# Patient Record
Sex: Female | Born: 1940
Health system: Southern US, Community
[De-identification: ages and names within clinical notes are randomized; demographics above are authoritative.]

## PROBLEM LIST (undated history)

## (undated) DIAGNOSIS — T4145XA Adverse effect of unspecified anesthetic, initial encounter: Secondary | ICD-10-CM

## (undated) DIAGNOSIS — M5136 Other intervertebral disc degeneration, lumbar region: Secondary | ICD-10-CM

## (undated) DIAGNOSIS — K5900 Constipation, unspecified: Secondary | ICD-10-CM

## (undated) DIAGNOSIS — G250 Essential tremor: Secondary | ICD-10-CM

## (undated) DIAGNOSIS — M545 Low back pain, unspecified: Secondary | ICD-10-CM

## (undated) DIAGNOSIS — T84029A Dislocation of unspecified internal joint prosthesis, initial encounter: Secondary | ICD-10-CM

## (undated) DIAGNOSIS — I779 Disorder of arteries and arterioles, unspecified: Secondary | ICD-10-CM

## (undated) DIAGNOSIS — M4326 Fusion of spine, lumbar region: Secondary | ICD-10-CM

## (undated) DIAGNOSIS — M79605 Pain in left leg: Secondary | ICD-10-CM

## (undated) DIAGNOSIS — C439 Malignant melanoma of skin, unspecified: Secondary | ICD-10-CM

## (undated) DIAGNOSIS — I739 Peripheral vascular disease, unspecified: Secondary | ICD-10-CM

## (undated) DIAGNOSIS — Z96649 Presence of unspecified artificial hip joint: Secondary | ICD-10-CM

## (undated) DIAGNOSIS — I10 Essential (primary) hypertension: Secondary | ICD-10-CM

## (undated) DIAGNOSIS — N189 Chronic kidney disease, unspecified: Secondary | ICD-10-CM

## (undated) DIAGNOSIS — T8859XA Other complications of anesthesia, initial encounter: Secondary | ICD-10-CM

## (undated) DIAGNOSIS — M1990 Unspecified osteoarthritis, unspecified site: Secondary | ICD-10-CM

## (undated) DIAGNOSIS — G479 Sleep disorder, unspecified: Secondary | ICD-10-CM

## (undated) DIAGNOSIS — M8580 Other specified disorders of bone density and structure, unspecified site: Secondary | ICD-10-CM

## (undated) DIAGNOSIS — I639 Cerebral infarction, unspecified: Secondary | ICD-10-CM

## (undated) DIAGNOSIS — M48061 Spinal stenosis, lumbar region without neurogenic claudication: Secondary | ICD-10-CM

## (undated) DIAGNOSIS — C679 Malignant neoplasm of bladder, unspecified: Secondary | ICD-10-CM

## (undated) DIAGNOSIS — E785 Hyperlipidemia, unspecified: Secondary | ICD-10-CM

## (undated) DIAGNOSIS — I209 Angina pectoris, unspecified: Secondary | ICD-10-CM

## (undated) DIAGNOSIS — M51369 Other intervertebral disc degeneration, lumbar region without mention of lumbar back pain or lower extremity pain: Secondary | ICD-10-CM

## (undated) DIAGNOSIS — F419 Anxiety disorder, unspecified: Secondary | ICD-10-CM

## (undated) DIAGNOSIS — R011 Cardiac murmur, unspecified: Secondary | ICD-10-CM

## (undated) DIAGNOSIS — G459 Transient cerebral ischemic attack, unspecified: Secondary | ICD-10-CM

## (undated) HISTORY — DX: Unspecified osteoarthritis, unspecified site: M19.90

## (undated) HISTORY — PX: TONSILLECTOMY AND ADENOIDECTOMY: SUR1326

## (undated) HISTORY — PX: CATARACT EXTRACTION W/ INTRAOCULAR LENS  IMPLANT, BILATERAL: SHX1307

## (undated) HISTORY — DX: Other specified disorders of bone density and structure, unspecified site: M85.80

## (undated) HISTORY — PX: BLADDER SURGERY: SHX569

## (undated) HISTORY — PX: BREAST BIOPSY: SHX20

## (undated) HISTORY — PX: TENDON REPAIR: SHX5111

## (undated) HISTORY — DX: Presence of unspecified artificial hip joint: T84.029A

## (undated) HISTORY — DX: Other intervertebral disc degeneration, lumbar region without mention of lumbar back pain or lower extremity pain: M51.369

## (undated) HISTORY — DX: Other intervertebral disc degeneration, lumbar region: M51.36

## (undated) HISTORY — PX: ANTERIOR LUMBAR FUSION: SHX1170

## (undated) HISTORY — PX: PILONIDAL CYST DRAINAGE: SHX743

## (undated) HISTORY — DX: Sleep disorder, unspecified: G47.9

## (undated) HISTORY — PX: POSTERIOR LUMBAR FUSION: SHX6036

## (undated) HISTORY — DX: Spinal stenosis, lumbar region without neurogenic claudication: M48.061

## (undated) HISTORY — DX: Hyperlipidemia, unspecified: E78.5

## (undated) HISTORY — DX: Fusion of spine, lumbar region: M43.26

## (undated) HISTORY — DX: Essential (primary) hypertension: I10

## (undated) HISTORY — PX: PILONIDAL CYST / SINUS EXCISION: SUR543

## (undated) HISTORY — DX: Peripheral vascular disease, unspecified: I73.9

## (undated) HISTORY — PX: EYE SURGERY: SHX253

## (undated) HISTORY — PX: MELANOMA EXCISION: SHX5266

## (undated) HISTORY — DX: Disorder of arteries and arterioles, unspecified: I77.9

## (undated) HISTORY — DX: Malignant neoplasm of bladder, unspecified: C67.9

## (undated) HISTORY — PX: THUMB FUSION: SUR636

## (undated) HISTORY — PX: LUMBAR SPINE HARDWARE REMOVAL: SHX1987

## (undated) HISTORY — PX: BACK SURGERY: SHX140

## (undated) HISTORY — DX: Presence of unspecified artificial hip joint: Z96.649

## (undated) HISTORY — PX: BUNIONECTOMY: SHX129

## (undated) HISTORY — DX: Essential tremor: G25.0

---

## 1999-09-17 ENCOUNTER — Other Ambulatory Visit: Admission: RE | Admit: 1999-09-17 | Discharge: 1999-09-17 | Payer: Self-pay | Admitting: Gynecology

## 2000-06-24 ENCOUNTER — Ambulatory Visit (HOSPITAL_COMMUNITY): Admission: RE | Admit: 2000-06-24 | Discharge: 2000-06-24 | Payer: Self-pay | Admitting: Internal Medicine

## 2000-08-27 ENCOUNTER — Ambulatory Visit (HOSPITAL_COMMUNITY): Admission: RE | Admit: 2000-08-27 | Discharge: 2000-08-27 | Payer: Self-pay | Admitting: Gastroenterology

## 2000-10-14 ENCOUNTER — Other Ambulatory Visit: Admission: RE | Admit: 2000-10-14 | Discharge: 2000-10-14 | Payer: Self-pay | Admitting: Gynecology

## 2001-09-15 ENCOUNTER — Other Ambulatory Visit: Admission: RE | Admit: 2001-09-15 | Discharge: 2001-09-15 | Payer: Self-pay | Admitting: Gynecology

## 2002-07-02 ENCOUNTER — Emergency Department (HOSPITAL_COMMUNITY): Admission: EM | Admit: 2002-07-02 | Discharge: 2002-07-02 | Payer: Self-pay | Admitting: Emergency Medicine

## 2002-10-04 ENCOUNTER — Other Ambulatory Visit: Admission: RE | Admit: 2002-10-04 | Discharge: 2002-10-04 | Payer: Self-pay | Admitting: Gynecology

## 2003-10-10 ENCOUNTER — Other Ambulatory Visit: Admission: RE | Admit: 2003-10-10 | Discharge: 2003-10-10 | Payer: Self-pay | Admitting: Gynecology

## 2004-07-17 ENCOUNTER — Ambulatory Visit (HOSPITAL_BASED_OUTPATIENT_CLINIC_OR_DEPARTMENT_OTHER): Admission: RE | Admit: 2004-07-17 | Discharge: 2004-07-17 | Payer: Self-pay | Admitting: Orthopedic Surgery

## 2004-07-17 ENCOUNTER — Ambulatory Visit (HOSPITAL_COMMUNITY): Admission: RE | Admit: 2004-07-17 | Discharge: 2004-07-17 | Payer: Self-pay | Admitting: Orthopedic Surgery

## 2006-02-19 ENCOUNTER — Ambulatory Visit (HOSPITAL_COMMUNITY): Admission: RE | Admit: 2006-02-19 | Discharge: 2006-02-19 | Payer: Self-pay | Admitting: Orthopedic Surgery

## 2007-01-21 ENCOUNTER — Ambulatory Visit: Payer: Self-pay | Admitting: Vascular Surgery

## 2007-08-25 ENCOUNTER — Ambulatory Visit: Payer: Self-pay | Admitting: Vascular Surgery

## 2008-04-12 ENCOUNTER — Ambulatory Visit: Payer: Self-pay | Admitting: Vascular Surgery

## 2008-10-25 ENCOUNTER — Ambulatory Visit: Payer: Self-pay | Admitting: Vascular Surgery

## 2009-05-02 ENCOUNTER — Ambulatory Visit: Payer: Self-pay | Admitting: Vascular Surgery

## 2009-10-26 ENCOUNTER — Ambulatory Visit: Payer: Self-pay | Admitting: Vascular Surgery

## 2010-06-20 ENCOUNTER — Ambulatory Visit: Payer: Self-pay | Admitting: Vascular Surgery

## 2010-11-20 NOTE — Procedures (Signed)
CAROTID DUPLEX EXAM   INDICATION:  Follow up carotid disease.   HISTORY:  Diabetes:  No.  Cardiac:  No.  Hypertension:  Yes.  Smoking:  No.  Previous Surgery:  No.  CV History:  No.  Amaurosis Fugax No, Paresthesias No, Hemiparesis No.                                       RIGHT             LEFT  Brachial systolic pressure:         130               140  Brachial Doppler waveforms:         WNL               WNL  Vertebral direction of flow:        Antegrade         Antegrade  DUPLEX VELOCITIES (cm/sec)  CCA peak systolic                   111               109  ECA peak systolic                   81                95  ICA peak systolic                   161               259  ICA end diastolic                   52                93  PLAQUE MORPHOLOGY:                  Heterogenous      Calcific  PLAQUE AMOUNT:                      Mild-to-moderate  Moderate  PLAQUE LOCATION:                    ICA               ICA   IMPRESSION:  1. 60% to 79% left internal carotid artery stenosis.  2. 40% to 59% right internal carotid artery stenosis.  3. Antegrade bilateral vertebral arteries.  4. Stable findings from previous examination on 10/26/2009.   ___________________________________________  [name]. Edilia Bo, M.D.   LT/MEDQ  D:  06/20/2010  T:  06/20/2010  Job:  [id number]

## 2010-11-20 NOTE — Procedures (Signed)
CAROTID DUPLEX EXAM   INDICATION:  Follow up of carotid disease.   HISTORY:  Diabetes:  No.  Cardiac:  No.  Hypertension:  Yes.  Smoking:  No.  Previous Surgery:  No.  CV History:  Asymptomatic.  Amaurosis Fugax No, Paresthesias No, Hemiparesis No.                                       RIGHT             LEFT  Brachial systolic pressure:         121               123  Brachial Doppler waveforms:         Triphasic         Triphasic  Vertebral direction of flow:        Antegrade         Antegrade  DUPLEX VELOCITIES (cm/sec)  CCA peak systolic                   76                66  ECA peak systolic                   66                57  ICA peak systolic                   123               120  ICA end diastolic                   48                38  PLAQUE MORPHOLOGY:                  Mixed             Mixed  PLAQUE AMOUNT:                      Moderate          Moderate  PLAQUE LOCATION:                    ICA, ECA          ICA, ECA   IMPRESSION:  1. 40-59% stenosis noted in bilateral internal carotid arteries.  2. Area where previous 60-79% stenosis was identified shows minimal      amount of plaque at this time on the left.   ___________________________________________  [name]. Edilia Bo, M.D.   CJ/MEDQ  D:  05/02/2009  T:  05/02/2009  Job:  [id number]

## 2010-11-20 NOTE — Procedures (Signed)
CAROTID DUPLEX EXAM   INDICATION:  Follow up carotid artery disease.   HISTORY:  Diabetes:  No  Cardiac:  No  Hypertension:  Yes, on medication.  Smoking:  No  Previous Surgery:  No  CV History:  No  Amaurosis Fugax No, Paresthesias  No, Hemiparesis  No                                       RIGHT             LEFT  Brachial systolic pressure:         152               157  Brachial Doppler waveforms:         biphasic          biphasic  Vertebral direction of flow:        antegrade         antegrade  DUPLEX VELOCITIES (cm/sec)  CCA peak systolic                   97                112  ECA peak systolic                   81                84  ICA peak systolic                   146               205  ICA end diastolic                   47                58  PLAQUE MORPHOLOGY:                  calcified         calcified  PLAQUE AMOUNT:                      moderate          moderate  PLAQUE LOCATION:                    ICA               ICA   IMPRESSION:  The right internal carotid artery shows evidence of 40% to  59% stenosis.  The left internal carotid artery shows evidence of 60% to  79% stenosis (lower end of range).  No significant changes from previous  study.   ___________________________________________  [name]. Edilia Bo, M.D.   AS/MEDQ  D:  01/21/2007  T:  01/22/2007  Job:  [id number]

## 2010-11-20 NOTE — Procedures (Signed)
CAROTID DUPLEX EXAM   INDICATION:  Followup, carotid artery disease.   HISTORY:  Diabetes:  No.  Cardiac:  No.  Hypertension:  Yes, on medication.  Smoking:  No.  Previous Surgery:  No.  CV History:  No.  Amaurosis Fugax No, Paresthesias No, Hemiparesis No                                        RIGHT             LEFT  Brachial systolic pressure:         140               140  Brachial Doppler waveforms:         Biphasic          Biphasic  Vertebral direction of flow:        Antegrade         Antegrade  DUPLEX VELOCITIES (cm/sec)  CCA peak systolic                   93                84  ECA peak systolic                   64                51  ICA peak systolic                   112               124  ICA end diastolic                   36                31  PLAQUE MORPHOLOGY:                  Mixed             Calcified with  shadowing  PLAQUE AMOUNT:                      Mild              Moderate  PLAQUE LOCATION:                    ICA               ICA   IMPRESSION:  20-39% bilateral internal carotid artery stenoses (upper  end of range); however, calcified plaque with shadowing on the left  could obscure a more severe stenosis.     ___________________________________________  [name]. Edilia Bo, M.D.   DP/MEDQ  D:  08/25/2007  T:  08/26/2007  Job:  [id number]

## 2010-11-20 NOTE — Procedures (Signed)
CAROTID DUPLEX EXAM   INDICATION:  Carotid disease.   HISTORY:  Diabetes:  No.  Cardiac:  No.  Hypertension:  Yes.  Smoking:  No.  Previous Surgery:  No.  CV History:  Asymptomatic.  Amaurosis Fugax No, Paresthesias No, Hemiparesis No.                                       RIGHT             LEFT  Brachial systolic pressure:         142               140  Brachial Doppler waveforms:         Normal            Normal  Vertebral direction of flow:        Antegrade         Antegrade  DUPLEX VELOCITIES (cm/sec)  CCA peak systolic                   111               116  ECA peak systolic                   67                84  ICA peak systolic                   160               246  ICA end diastolic                   49                73  PLAQUE MORPHOLOGY:                  Mixed             Mixed  PLAQUE AMOUNT:                      Moderate          Moderate  PLAQUE LOCATION:                    ICA/ECA           ICA/ECA   IMPRESSION:  1. Doppler velocity suggests a 40% to 59% stenosis of the right      internal carotid artery and a 60% to 79% stenosis of the left      internal carotid artery.  2. No significant change noted when compared to the previous exam on      04/12/2008.   ___________________________________________  [name]. Edilia Bo, M.D.   CH/MEDQ  D:  10/25/2008  T:  10/25/2008  Job:  [id number]

## 2010-11-20 NOTE — Procedures (Signed)
CAROTID DUPLEX EXAM   INDICATION:  Follow up known carotid artery disease.   HISTORY:  Diabetes:  No.  Cardiac:  No.  Hypertension:  Yes.  Smoking:  No.  Previous Surgery:  None.  CV History:  Amaurosis Fugax No, Paresthesias No, Hemiparesis No.                                       RIGHT             LEFT  Brachial systolic pressure:  Brachial Doppler waveforms:  Vertebral direction of flow:        Antegrade         Antegrade  DUPLEX VELOCITIES (cm/sec)  CCA peak systolic                   133               121  ECA peak systolic                   99                116  ICA peak systolic                   177               233  ICA end diastolic                   63                67  PLAQUE MORPHOLOGY:                  Heterogenous      Heterogenous  PLAQUE AMOUNT:                      Moderate          Moderate  PLAQUE LOCATION:                    ICA, ECA          ICA, ECA   IMPRESSION:  1. 60-79% stenosis noted in the left internal carotid artery.  2. 40-59% stenosis noted in the right internal carotid artery.  3. Antegrade bilateral vertebral arteries.   ___________________________________________  [name]. Edilia Bo, M.D.   MG/MEDQ  D:  04/12/2008  T:  04/12/2008  Job:  [id number]

## 2010-11-23 NOTE — Procedures (Signed)
[place]  Patient:    Shannon Berger, Shannon Berger                MRN: [id number] Proc. Date: 08/27/00 Adm. Date:  [id number] Attending:  Rich Brave CC:         Rodrigo Ran, M.D.   Procedure Report  PROCEDURE:  Colonoscopy.  INDICATION:  A 70 year old female with slight drop in hemoglobin and a family history of colon cancer.  FINDINGS:  Normal exam.  DESCRIPTION OF PROCEDURE:  The nature, purpose, and risks of the procedure had been discussed with the patient, who provided written consent.  Sedation was fentanyl 50 mcg and Versed 5 mg IV without arrhythmias or desaturation.  The Olympus pediatric adjustable-tension video colonoscope was advanced to a somewhat angulated and slightly fixated sigmoid region and then quite easily the remainder of the distance around the colon to the cecum, turning the patient into the supine position to facilitate getting through the sharp angulations in the sigmoid area.  Once the cecum was reached, pullback was initiated.  The quality of prep was very good, and it is felt that all areas were adequately seen.  This was a normal examination.  No polyps, cancer, colitis, vascular malformations, or diverticular disease were observed.  Retroflexion in the rectum was unremarkable.  No biopsies were obtained.  The patient tolerated the procedure well, and there were no apparent complications.  IMPRESSION:  Normal colonoscopy in a patient with a family history of colon cancer in her mother, but with heme-negative stool.  PLAN:  Follow-up colonoscopy in five years. DD:  08/27/00 TD:  08/27/00 Job: [id number]

## 2010-11-23 NOTE — Op Note (Signed)
NAMEJENELLE, Shannon Berger         ACCOUNT NO.:  [id number]   MEDICAL RECORD NO.:  [id number]          PATIENT TYPE:  AMB   LOCATION:  DSC                          FACILITY:  [facility]   PHYSICIAN:  Leonides Grills, M.D.     DATE OF BIRTH:  03/11/1941   DATE OF PROCEDURE:  07/17/2004  DATE OF DISCHARGE:                                 OPERATIVE REPORT   PREOPERATIVE DIAGNOSIS:  Right hallux valgus.   POSTOPERATIVE DIAGNOSIS:  Right hallux valgus.   OPERATION:  1.  Right chevron bunionectomy.  2.  Stress x-rays, right foot.   ANESTHESIA:  Popliteal block with sedation.   SURGEON:  Leonides Grills, M.D.   ASSISTANT:  Lianne Cure, P.A.-C.   ESTIMATED BLOOD LOSS:  Minimal.   TOURNIQUET TIME:  Approximately 40 minutes.   COMPLICATIONS:  None.   DISPOSITION:  Stable to the PR.   INDICATIONS:  This is a 70 year old female with long-standing right great  toe pain that is interfering with her lifestyle.  She was consented for the  above procedure and all the risks which include infection, nerve or vessel  injury, nonunion, malunion, failure, persistent pain, stiffness, arthritis,  avascular necrosis, collapse, possible arthritis in fusion, recurrence of  deformity, and prolonged recovery were all explained, questions were  encouraged and answered.   DESCRIPTION OF OPERATION:  The patient was brought to the operating room and  placed in the supine position.  After a popliteal block with sedation was  administered as well as Ancef 1 g IV piggyback, the right lower extremity  was then prepped and draped in a sterile manner.  We placed a thigh  tourniquet, the limb was exsanguinated and the tourniquet was elevated to  290 mmHg.  A longitudinal incision over the medial aspect of the great toe  MTP joint was made, dissection was carried down through the skin and  hemostasis was obtained.  The neurovascular structures were identified both  superiorly and inferiorly and protected throughout  the case.  An L-shaped  capsulotomy was then performed.  A simple bunionectomy was then performed  with a sagittal saw.  The lateral capsule was then released with a Beaver  blade.  __________  were then rounded off with a rongeur.  Soft tissues were  then elevated both superiorly and inferiorly, the center of the head was  identified and chevron osteotomy was created.  The head was then translated  approximately 3 mm laterally and then fixed with a 2.0 mm fully-threaded  cortical set screw, using a 1.5-mm drill hole, respectively.  This had  excellent purchase and maintenance of the correction.  The redundant bone  medially was then trimmed off with a sagittal saw and the area was copiously  irrigated with normal saline.  We then advanced the capsule both superiorly  and proximally, and fixed this with 2-0 Vicryl suture.  This had an  excellent repair and excellent range of motion of the toe.  Stress x-rays  were obtained in the AP and lateral planes and these showed excellent  translation of the sesamoids in the proper position and there was excellent  correction of  the toe as well.  The area was copiously irrigated with normal  saline, the tourniquet was deflated and hemostasis obtained.  The skin was  closed with 4-0 nylon suture, a sterile dressing was applied, a Programmer, multimedia  dressing was applied, a hard soled shoe was applied and the patient was  stable to the PR.      Paul   PB/MEDQ  D:  07/17/2004  T:  07/17/2004  Job:  [id number]

## 2010-11-23 NOTE — Op Note (Signed)
NAMESUSIE, EHRESMAN         ACCOUNT NO.:  [id number]   MEDICAL RECORD NO.:  [id number]          PATIENT TYPE:  AMB   LOCATION:  DAY                          [facility]ACILITY:  [facility]   PHYSICIAN:  Ollen Gross, M.D.    DATE OF BIRTH:  Nov 28, 1940   DATE OF PROCEDURE:  02/19/2006  DATE OF DISCHARGE:                                 OPERATIVE REPORT   PREOPERATIVE DIAGNOSIS:  Left hip intractable bursitis and gluteal tendon  repair.   POSTOPERATIVE DIAGNOSIS:  Left hip intractable bursitis and gluteal tendon  repair.   PROCEDURE:  Left hip bursectomy with gluteus medius tendon reattachment and  repair.   SURGEON:  Ollen Gross, M.D.   ANESTHESIA:  General.   ESTIMATED BLOOD LOSS:  Minimal.   DRAINS:  None.   COMPLICATIONS:  None.   CONDITION:  Stable to recovery.   BRIEF CLINICAL NOTE:  Julieanne is a 70 year old female with a long history  of intractable left hip pain.  She had a recent MRI showing he had a gluteal  tendon detachment/tear.  She presents now for the above-mentioned procedure.   PROCEDURE IN DETAIL:  After successful of administration of general  anesthetic, the patient was placed in the right lateral decubitus position  with the left side up and held the with the hip positioner.  The left lower  was extremity isolated from her perineum with plastic drapes and prepped and  draped in the usual sterile fashion.  A short posterolateral incision was  made with a 10 blade through the subcutaneous tissue to the level of the  fascia lata.  Fascia lata was quite thin.  I incised the fascia lata and  inspected the trochanteric area.  There was a markedly thickened  trochanteric bursa which was excised with electrocautery.  There were  several clinical spikes and spurs on the greater trochanter which I excised  to smooth off the surface.  The gluteus medius tendon central one-third was  torn off of the greater trochanter.  I roughened up the edge of the  trochanter and  then drilled two holes through it.  I used #2 Ethibond suture  to reattach the tendon to the greater trochanter through the drill holes.  This was then oversewn and felt to be very stable repair.  The wound was  then copiously irrigated with saline solution and the  fascia lata closed with interrupted #1 Vicryl.  Subcu was closed with #1 and  2-0 Vicryl and subcuticular running 4-0 Monocryl.  20 mL of 0.25% Marcaine  with epi were injected in the wound bed and peri-incisional area.  Steri-  Strips and a bulky sterile dressing were then applied, and she is awakened  and transferred to recovery in stable condition.      Ollen Gross, M.D.  Electronically Signed     FA/MEDQ  D:  02/19/2006  T:  02/19/2006  Job:  [id number]

## 2010-12-19 ENCOUNTER — Other Ambulatory Visit (INDEPENDENT_AMBULATORY_CARE_PROVIDER_SITE_OTHER): Payer: Medicare Other

## 2010-12-19 ENCOUNTER — Ambulatory Visit (INDEPENDENT_AMBULATORY_CARE_PROVIDER_SITE_OTHER): Payer: Medicare Other | Admitting: Vascular Surgery

## 2010-12-19 DIAGNOSIS — I6529 Occlusion and stenosis of unspecified carotid artery: Secondary | ICD-10-CM

## 2010-12-20 NOTE — Assessment & Plan Note (Signed)
OFFICE VISIT  Shannon, Berger DOB:  [birth year]                                       12/19/2010 ZOXWR#:[id number]  I saw the patient in the office today for continued follow-up of her carotid disease.  This is a pleasant 70 year old woman who I originally saw in consultation with moderate carotid disease back in 2008.  She comes in for a yearly follow-up study.  Since I saw her last, she has had no history of stroke, TIAs, expressive or receptive aphasia or amaurosis fugax.  PAST MEDICAL HISTORY:  Significant for hypertension and hypercholesterolemia.  She denies any history of diabetes or history of heart disease.  SOCIAL HISTORY:  She is married and has 2 children.  She does not smoke cigarettes.  REVIEW OF SYSTEMS:  CARDIOVASCULAR:  She had no chest pain, chest pressure, palpitations or arrhythmias. PULMONARY:  She has had no productive cough, bronchitis, asthma or wheezing.  PHYSICAL EXAMINATION:  This is a pleasant 69-year woman who appears her stated age.  Blood pressure is 184/69, heart rate is 52, saturation 99%. Lungs:  Clear bilaterally to auscultation.  Cardiovascular:  I do not detect any carotid bruits.  She has a regular rate and rhythm.  Abdomen: Soft and nontender.  Neurologic:  She has no focal weakness or paresthesias.  I have independently interpreted her carotid duplex scan which shows bilateral 40% to 59% carotid stenosis.  Both vertebral arteries are patent with normally directed flow arm pressures are essentially equal. I have compared her duplex to her study from back in December 2011, and velocities have actually decreased on the left.  Given that she has only moderate carotid disease, I have recommend that we extend her follow-up out to 1 year and I will see her back in 1 year with a follow-up carotid duplex scan.  She knows to call sooner if she has problems.  In the meantime she knows to continue taking her  aspirin.    [name]. Edilia Bo, M.D. Electronically Signed  CSD/MEDQ  D:  12/19/2010  T:  12/20/2010  Job:  [id number]  cc:   Loraine Leriche A. Perini, M.D.

## 2010-12-27 NOTE — Procedures (Unsigned)
CAROTID DUPLEX EXAM  INDICATION:  Carotid artery disease  HISTORY: Diabetes:  No Cardiac:  No Hypertension:  Yes Smoking:  No Previous Surgery:  No CV History:  The patient is currently asymptomatic Amaurosis Fugax No, Paresthesias No, Hemiparesis No                                      RIGHT             LEFT Brachial systolic pressure:         132               138 Brachial Doppler waveforms:         WNL               WNL Vertebral direction of flow:        Antegrade         Antegrade DUPLEX VELOCITIES (cm/sec) CCA peak systolic                   94                81 ECA peak systolic                   71                123 ICA peak systolic                   129               156 ICA end diastolic                   42                38 PLAQUE MORPHOLOGY:                  Heterogeneous     Heterogeneous PLAQUE AMOUNT:                      Moderate          Moderate PLAQUE LOCATION:                    ICA and ECA       ICA and ECA  IMPRESSION: 1. Bilateral 40% to 59% stenosis within the internal carotid arteries. 2. Bilateral intimal thickening within the common carotid arteries. 3. Left internal carotid artery velocity not as elevated today as     previous study. 4. Study stable compared to previous.  ___________________________________________ [name]. Edilia Bo, M.D.  OD/MEDQ  D:  12/19/2010  T:  12/19/2010  Job:  [id number]

## 2011-02-04 ENCOUNTER — Encounter (INDEPENDENT_AMBULATORY_CARE_PROVIDER_SITE_OTHER): Payer: Self-pay | Admitting: Surgery

## 2011-02-05 ENCOUNTER — Encounter (INDEPENDENT_AMBULATORY_CARE_PROVIDER_SITE_OTHER): Payer: Self-pay | Admitting: Surgery

## 2011-02-05 ENCOUNTER — Ambulatory Visit (INDEPENDENT_AMBULATORY_CARE_PROVIDER_SITE_OTHER): Payer: Medicare Other | Admitting: Surgery

## 2011-02-05 DIAGNOSIS — L0591 Pilonidal cyst without abscess: Secondary | ICD-10-CM | POA: Insufficient documentation

## 2011-02-05 NOTE — Progress Notes (Signed)
Chief Complaint  Patient presents with  . Other    new pt- cyst on bottom    Shannon Berger is Berger 70 y.o. white female who is Berger patient of PERINI,Shannon A, MD and comes to me today for pilonidal cyst.  She has noticed Berger lump between her buttocks for years.  When she bikes at the [facility], it is sometimes aggrivated.  But over the last 2 weeks she has had increasing pain.  She saw Dr. Venancio Berger. She has tried putting some topical ointments/antibx on it, but without improvement.  And she is now here for further eval. Of this intragluteal mass/pain.  No prior GI history.  She had Berger colonoscopy by Dr. Leonard Schwartz Berger in 2009.  Her mother had colon ca, so she is getting colonoscopies every 5 years.  Past Medical History  Diagnosis Date  . Arthritis   . Hypertension   . Pilonidal cyst   . Sleep difficulties   . Ear congestion     Past Surgical History  Procedure Date  . Bunionectomy   . Tonsillectomy and adenoidectomy   . Tendon repair     left hip    Current Outpatient Prescriptions  Medication Sig Dispense Refill  . ALPRAZolam (XANAX) 0.5 MG tablet Take 0.25 mg by mouth at bedtime as needed.        . Ascorbic Acid (VITAMIN C) 500 MG CAPS Take by mouth daily.        Marland Kitchen aspirin 81 MG tablet Take 81 mg by mouth daily.        Marland Kitchen atorvastatin (LIPITOR) 20 MG tablet Take 20 mg by mouth daily.        . bisoprolol-hydrochlorothiazide (ZIAC) 2.5-6.25 MG per tablet Daily.      . Calcium Carbonate-Vitamin D (CALCIUM 600 + D PO) Take by mouth daily.        . chlorthalidone (HYGROTON) 25 MG tablet Daily.      . Cholecalciferol (VITAMIN D3) 2000 UNITS TABS Take by mouth daily.        . DULoxetine (CYMBALTA) 60 MG capsule Take 60 mg by mouth daily.        Marland Kitchen EVISTA 60 MG tablet Daily.      Marland Kitchen KLOR-CON 10 10 MEQ CR tablet Daily.      Marland Kitchen MAGNESIUM PO Take 400 mg by mouth daily.        . meloxicam (MOBIC) 15 MG tablet Take 15 mg by mouth as needed.        . Multiple Vitamin (MULTIVITAMIN) tablet Take 1  tablet by mouth daily.        . mupirocin (BACTROBAN) 2 % ointment BID times 48H.      Marland Kitchen Omega-3 Fatty Acids (FISH OIL PO) Take 1,200 mg by mouth daily.          No Known Allergies  Review of Systems: Skin:  No history of rash.  No history of abnormal moles. Infection:   No history of MRSA. Neurologic:  No history of stroke.  No history of seizure.  No history of headaches. Cardiac: Hypertension for 12 years.  Evaluation for tachycardias by Dr. P. Berger in 2009.  She had Berger neg eval.   No history of prior cardiac catheterization.  Pulmonary:  Does not smoke cigarettes.  No asthma or bronchitis.  No OSA/CPAP.  Endocrine:  No diabetes. No thyroid disease. Gastrointestinal:  No history of stomach disease.  No history of liver disease.  No history of gall bladder disease.  No history of pancreas disease.  No history of colon disease.  See history of present illness. Urologic:  No history of kidney stones.  No history of bladder infections. Musculoskeletal:  No history of joint or back disease. Hematologic:  No bleeding disorder.  No history of anemia.  Not anticoagulated. ENT:  She's had "stopped up" ears.  This has caused significant trouble. Diagnosis unknown.  Not Minierre's disease.   SOCIAL and FAMILY HISTORY: Accompanied by her husband, Shannon Berger. 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  PHYSICAL EXAM: Filed Vitals:   02/05/11 1658  BP: 140/86  Pulse: 80  Temp: 98.2 F (36.8 C)       Body mass index is 20.85 kg/(m^2).  HEENT: Normal. Pupils equal. Normal dentition. Neck: Supple. No thyroid mass.  Lungs: Clear and symmetric. Heart:  RRR. No murmur. Abdomen: No mass. No tenderness. No hernia. Normal bowel sounds.  No abdominal scars. Buttocks:  3 cm intragluteal mass.  Tender.  C/w pilonidal cyst/abscess. Extremities:  Good strength in upper and lower extremities. Neurologic:  Grossly intact to motor and sensory function.  DATA REVIEWED: Notes  from Dr. Venancio Berger.  ASSESSMENT and PLAN: 1.  Pilonidal cyst/abscess.  While in the office.  I did Berger I&D of pilonidal cyst.  Block with 1% xylocaine with epi, approx. 8 cc.  Painted with betadine.  2 cm incision.  Whitish fluid drained.  I gave her literature on pilonidal cyst.  I discussed that sometimes after I&D, there is Berger remaining cyst sac that will need to be excised at Berger later date. She will do sitz baths 3x/day. I will see her back in 2-3 weeks.  2.  Hypertension. 3.  Ear problems, stuffed up.  No diagnosis, but primary patient complaint.

## 2011-02-05 NOTE — Patient Instructions (Addendum)
Remove bandage tomorrow.  Soak in warm tub 3 times a day.  Repack open wound as best as possible  Return to clinic in 2 to 3 weeks

## 2011-02-07 ENCOUNTER — Telehealth (INDEPENDENT_AMBULATORY_CARE_PROVIDER_SITE_OTHER): Payer: Self-pay

## 2011-02-07 NOTE — Telephone Encounter (Signed)
Pt called with questions about her wound care.  She wanted to know how long she should be soaking the area and how long to pack.  She was told to soak 3 times a day.  I asked her to soak until it is closed or she sees [place] back.  I  Explained it will heal from the inside out and eventually she won't be able to pack it.  She will continue packing as long as she is soaking and it is open

## 2011-02-22 ENCOUNTER — Ambulatory Visit (INDEPENDENT_AMBULATORY_CARE_PROVIDER_SITE_OTHER): Payer: Medicare Other | Admitting: Surgery

## 2011-02-22 DIAGNOSIS — L0591 Pilonidal cyst without abscess: Secondary | ICD-10-CM

## 2011-02-22 NOTE — Patient Instructions (Signed)
Continue local wound care.  You can decrease the dressing changes to once or twice/day.

## 2011-02-22 NOTE — Progress Notes (Addendum)
Chief Complaint   Shannon Berger is a 70 y.o. white female who is a patient of PERINI,MARK A, MD and comes to me today for pilonidal cyst.  She has noticed a lump between her buttocks for years.  When she bikes at the [facility], it is sometimes aggrivated.  But over the last 2 weeks she has had increasing pain.  She saw Dr. Venancio Poisson. She has tried putting some topical ointments/antibx on it, but without improvement.  And she is now here for further eval. Of this intragluteal mass/pain.  I did an I&D of the her pilonidal cyst on 02/05/11.  The wound has healed a lot since that day.  SOCIAL and FAMILY HISTORY: Accompanied by her husband, Shannon Berger. 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  PHYSICAL EXAM:  Buttocks:  Pilonidal cyst/abscess looks much better.  ASSESSMENT and PLAN: 1.  Pilonidal cyst/abscess.  Wound doing well.  Can decrease the # of dressing changes.  ? Needs further excision ?  She is flying in 5 days.  She should be okay.  Return to our office prn.  2.  Hypertension. 3.  Ear problems, stuffed up.  No diagnosis, but primary patient complaint.

## 2011-02-28 ENCOUNTER — Encounter: Payer: Self-pay | Admitting: Cardiology

## 2011-03-06 ENCOUNTER — Ambulatory Visit: Payer: Medicare Other | Admitting: Cardiology

## 2011-04-15 ENCOUNTER — Encounter: Payer: Self-pay | Admitting: Cardiology

## 2011-04-15 ENCOUNTER — Ambulatory Visit (INDEPENDENT_AMBULATORY_CARE_PROVIDER_SITE_OTHER): Payer: Medicare Other | Admitting: Cardiology

## 2011-04-15 DIAGNOSIS — R079 Chest pain, unspecified: Secondary | ICD-10-CM

## 2011-04-15 DIAGNOSIS — I779 Disorder of arteries and arterioles, unspecified: Secondary | ICD-10-CM | POA: Insufficient documentation

## 2011-04-15 DIAGNOSIS — I10 Essential (primary) hypertension: Secondary | ICD-10-CM

## 2011-04-15 DIAGNOSIS — E785 Hyperlipidemia, unspecified: Secondary | ICD-10-CM

## 2011-04-15 DIAGNOSIS — I493 Ventricular premature depolarization: Secondary | ICD-10-CM | POA: Insufficient documentation

## 2011-04-15 DIAGNOSIS — I4949 Other premature depolarization: Secondary | ICD-10-CM

## 2011-04-15 NOTE — Assessment & Plan Note (Signed)
Her PVCs are rarely symptomatic. She needs to continue to avoid caffeine.

## 2011-04-15 NOTE — Assessment & Plan Note (Signed)
She has atypical chest pain symptoms. Given her risk factors and known history of coronary disease we'll schedule her for stress echo evaluation. We can compare this with her prior study in 2008.

## 2011-04-15 NOTE — Assessment & Plan Note (Signed)
Blood pressure is in excellent control. She'll continue her current regimen.

## 2011-04-15 NOTE — Progress Notes (Signed)
Shannon Berger Date of Birth: 1941-05-31   History of Present Illness: Shannon Berger is seen for yearly followup. She complains of intermittent chest pain that is not related to exertion. It resolves quickly on its own. No SOB or diaphoresis. Occ. Nausea. She notes occ. Palpitations but these are fairly rare. No dizzyness or lightheadedness. Her last stress test was in 2008. No known history of CAD but positive history of carotid disease.  Current Outpatient Prescriptions on File Prior to Visit  Medication Sig Dispense Refill  . ALPRAZolam (XANAX) 0.5 MG tablet Take 0.25 mg by mouth at bedtime as needed.        . Ascorbic Acid (VITAMIN C) 500 MG CAPS Take by mouth daily.        Marland Kitchen aspirin 81 MG tablet Take 81 mg by mouth daily.        Marland Kitchen atorvastatin (LIPITOR) 20 MG tablet Take 20 mg by mouth daily.        . bisoprolol-hydrochlorothiazide (ZIAC) 2.5-6.25 MG per tablet Daily.      . Calcium Carbonate-Vitamin D (CALCIUM 600 + D PO) Take by mouth daily.        . chlorthalidone (HYGROTON) 25 MG tablet Daily.      . Cholecalciferol (VITAMIN D3) 2000 UNITS TABS Take by mouth daily.        . DULoxetine (CYMBALTA) 60 MG capsule Take 60 mg by mouth daily.        Marland Kitchen EVISTA 60 MG tablet Daily.      Marland Kitchen KLOR-CON 10 10 MEQ CR tablet Daily.      Marland Kitchen MAGNESIUM PO Take 400 mg by mouth daily.        . meloxicam (MOBIC) 15 MG tablet Take 15 mg by mouth as needed.        . Multiple Vitamin (MULTIVITAMIN) tablet Take 1 tablet by mouth daily.        . mupirocin (BACTROBAN) 2 % ointment BID times 48H.      Marland Kitchen Omega-3 Fatty Acids (FISH OIL PO) Take 1,200 mg by mouth daily.          No Known Allergies  Past Medical History  Diagnosis Date  . Arthritis   . Hypertension   . Pilonidal cyst   . Sleep difficulties   . Ear congestion   . Carotid arterial disease   . Osteopenia   . Hyperlipidemia   . Osteoarthritis     Past Surgical History  Procedure Date  . Bunionectomy   . Tonsillectomy and  adenoidectomy   . Tendon repair     left hip  . Foot surgery     Status post right foot surgery  . Other surgical history     post fusion of her right thumb  . Pilonidal cyst / sinus excision     History  Smoking status  . Unknown If Ever Smoked  Smokeless tobacco  . Not on file    History  Alcohol Use  . Yes    Family History  Problem Relation Age of Onset  . Heart failure Mother     Review of Systems: The review of systems is positive for carotid arterial disease. She gets yearly carotid Doppler studies. She has been bothered this year with a sensation that her left ear is stopped up and she does have hearing loss. She does exercise at the [employer] days a week.  All other systems were reviewed and are negative.  Physical Exam: BP 110/70  Pulse 60  Ht 5\' 2"  (1.575 m)  Wt 118 lb 12.8 oz (53.887 kg)  BMI 21.73 kg/m2 The patient is alert and oriented x 3.  The mood and affect are normal.  The skin is warm and dry.  Color is normal.  The HEENT exam reveals that the sclera are nonicteric.  The mucous membranes are moist.  The carotids are 2+ without bruits.  There is no thyromegaly.  There is no JVD.  The lungs are clear.  The chest wall is non tender.  The heart exam reveals a regular rate with a normal S1 and S2.  There are no murmurs, gallops, or rubs.  The PMI is not displaced.   Abdominal exam reveals good bowel sounds.  There is no guarding or rebound.  There is no hepatosplenomegaly or tenderness.  There are no masses.  Exam of the legs reveal no clubbing, cyanosis, or edema.  The legs are without rashes.  The distal pulses are intact.  Cranial nerves II - XII are intact.  Motor and sensory functions are intact.  The gait is normal.  LABORATORY DATA:   Assessment / Plan:

## 2011-04-15 NOTE — Patient Instructions (Signed)
We will schedule you for a stress Echo.  Continue your current medications and exercise program.  If your stress test is ok I will see you again in 1 year.

## 2011-04-18 ENCOUNTER — Ambulatory Visit (HOSPITAL_COMMUNITY): Payer: Medicare Other | Attending: Cardiology | Admitting: Radiology

## 2011-04-18 ENCOUNTER — Ambulatory Visit (HOSPITAL_BASED_OUTPATIENT_CLINIC_OR_DEPARTMENT_OTHER): Payer: Medicare Other

## 2011-04-18 DIAGNOSIS — E785 Hyperlipidemia, unspecified: Secondary | ICD-10-CM | POA: Insufficient documentation

## 2011-04-18 DIAGNOSIS — I10 Essential (primary) hypertension: Secondary | ICD-10-CM | POA: Insufficient documentation

## 2011-04-18 DIAGNOSIS — R002 Palpitations: Secondary | ICD-10-CM | POA: Insufficient documentation

## 2011-04-18 DIAGNOSIS — R079 Chest pain, unspecified: Secondary | ICD-10-CM | POA: Insufficient documentation

## 2011-04-18 DIAGNOSIS — I4949 Other premature depolarization: Secondary | ICD-10-CM | POA: Insufficient documentation

## 2011-04-18 DIAGNOSIS — R072 Precordial pain: Secondary | ICD-10-CM

## 2011-04-18 DIAGNOSIS — R0989 Other specified symptoms and signs involving the circulatory and respiratory systems: Secondary | ICD-10-CM

## 2011-04-19 ENCOUNTER — Telehealth: Payer: Self-pay | Admitting: Cardiology

## 2011-04-19 NOTE — Telephone Encounter (Signed)
Pt calling wanting to discuss results of pt ECHO Stress. Pt going out of town Monday but doesn't feel comfortable traveling without knowing the results of the test considering while doing procedure the "techs" noticed something wrong. Please return pt call to discuss results of ECHO stress ASAP.

## 2011-04-19 NOTE — Telephone Encounter (Signed)
I spoke with the patient and gave her the preliminary results of her stress echo. I explained that Dr. [place] has not signed off on her test yet and will not be back in the office until next Wednesday. She states that the techs doing her test stated there may have been something abnormal they saw after she came off the treadmill. I explained to the patient I did not see anything documented. Her baseline EF was at 60%. Her peak stress EF was 75%. I am not certain if the difference is what they were referring to. I will ask that Synetta Fail call her back after Dr. [place] reviews her test. She requests that she be called on her cell # first [phone], then her home # [phone]. She will be out of town until next Thursday.

## 2011-04-21 NOTE — Telephone Encounter (Signed)
Stress test is normal.  Shannon Berger

## 2011-04-22 ENCOUNTER — Telehealth: Payer: Self-pay | Admitting: *Deleted

## 2011-04-22 NOTE — Telephone Encounter (Signed)
Notified of stress test results. Will send copy to Dr. Waynard Edwards.

## 2011-04-22 NOTE — Telephone Encounter (Signed)
Message copied by Lorayne Bender on Mon Apr 22, 2011  8:29 AM ------      Message from: [place], [name]      Created: Sun Apr 21, 2011  8:03 PM       Stress Echo is normal. Please report and copy to Dr. Waynard Edwards.      Theron Arista [place]

## 2011-04-22 NOTE — Telephone Encounter (Signed)
Notified of stress echo results. 

## 2011-12-23 ENCOUNTER — Other Ambulatory Visit: Payer: Self-pay | Admitting: *Deleted

## 2011-12-23 DIAGNOSIS — I6529 Occlusion and stenosis of unspecified carotid artery: Secondary | ICD-10-CM

## 2011-12-24 ENCOUNTER — Encounter: Payer: Self-pay | Admitting: Neurosurgery

## 2011-12-25 ENCOUNTER — Ambulatory Visit (INDEPENDENT_AMBULATORY_CARE_PROVIDER_SITE_OTHER): Payer: Medicare Other | Admitting: Neurosurgery

## 2011-12-25 ENCOUNTER — Ambulatory Visit (INDEPENDENT_AMBULATORY_CARE_PROVIDER_SITE_OTHER): Payer: Medicare Other | Admitting: Vascular Surgery

## 2011-12-25 ENCOUNTER — Encounter: Payer: Self-pay | Admitting: Neurosurgery

## 2011-12-25 VITALS — BP 106/69 | HR 59 | Resp 14 | Ht 62.0 in | Wt 118.0 lb

## 2011-12-25 DIAGNOSIS — I779 Disorder of arteries and arterioles, unspecified: Secondary | ICD-10-CM | POA: Insufficient documentation

## 2011-12-25 DIAGNOSIS — I6529 Occlusion and stenosis of unspecified carotid artery: Secondary | ICD-10-CM

## 2011-12-25 NOTE — Progress Notes (Signed)
Carotid duplex performed @ [facility] 12/25/2011

## 2011-12-25 NOTE — Addendum Note (Signed)
Addended by: Sharee Pimple on: 12/25/2011 11:33 AM   Modules accepted: Orders

## 2011-12-25 NOTE — Progress Notes (Signed)
[facility] [place] Carotid Office Note  CC: Annual carotid duplex Referring Physician: Edilia Bo  History of Present Illness: 71 year old female patient of Dr. Edilia Bo followed for known carotid stenosis with no intervention history. The patient denies any signs or symptoms of CVA, TIA, amaurosis fugax or any neural deficit. The patient also denies any new medical diagnoses or recent surgery. The patient states she has concern for her brother who has never been checked and his older than her and they do have a family history with her father having had a left CEA x2. I recommended that if she has concerns that her brother should possibly be evaluated here and we would be happy to see him.  Past Medical History  Diagnosis Date  . Arthritis   . Hypertension   . Pilonidal cyst   . Sleep difficulties   . Ear congestion   . Carotid arterial disease   . Osteopenia   . Hyperlipidemia   . Osteoarthritis     ROS: [x]  Positive   [ ]  Denies    General: [ ]  Weight loss, [ ]  Fever, [ ]  chills Neurologic: [ ]  Dizziness, [ ]  Blackouts, [ ]  Seizure [ ]  Stroke, [ ]  "Mini stroke", [ ]  Slurred speech, [ ]  Temporary blindness; [ ]  weakness in arms or legs, [ ]  Hoarseness Cardiac: [ ]  Chest pain/pressure, [ ]  Shortness of breath at rest [ ]  Shortness of breath with exertion, [ ]  Atrial fibrillation or irregular heartbeat Vascular: [ ]  Pain in legs with walking, [ ]  Pain in legs at rest, [ ]  Pain in legs at night,  [ ]  Non-healing ulcer, [ ]  Blood clot in vein/DVT,   Pulmonary: [ ]  Home oxygen, [ ]  Productive cough, [ ]  Coughing up blood, [ ]  Asthma,  [ ]  Wheezing Musculoskeletal:  [ ]  Arthritis, [ ]  Low back pain, [ ]  Joint pain Hematologic: [ ]  Easy Bruising, [ ]  Anemia; [ ]  Hepatitis Gastrointestinal: [ ]  Blood in stool, [ ]  Gastroesophageal Reflux/heartburn, [ ]  Trouble swallowing Urinary: [ ]  chronic Kidney disease, [ ]  on HD - [ ]  MWF or [ ]  TTHS, [ ]  Burning with urination, [ ]   Difficulty urinating Skin: [ ]  Rashes, [ ]  Wounds Psychological: [ ]  Anxiety, [ ]  Depression   Social History History  Substance Use Topics  . Smoking status: Unknown If Ever Smoked  . Smokeless tobacco: Not on file  . Alcohol Use: Yes    Family History Family History  Problem Relation Age of Onset  . Heart failure Mother   . Cancer Mother   . Hypertension Mother   . Other Mother     Varicose Veins  . Diabetes Father   . Heart disease Father   . Hypertension Father   . Heart attack Father     No Known Allergies  Current Outpatient Prescriptions  Medication Sig Dispense Refill  . ALPRAZolam (XANAX) 0.5 MG tablet Take 0.25 mg by mouth at bedtime as needed.        . Ascorbic Acid (VITAMIN C) 500 MG CAPS Take by mouth daily.        Marland Kitchen aspirin 81 MG tablet Take 81 mg by mouth daily.        Marland Kitchen atorvastatin (LIPITOR) 20 MG tablet Take 20 mg by mouth daily.        . bisoprolol-hydrochlorothiazide (ZIAC) 2.5-6.25 MG per tablet Daily.      . Calcium Carbonate-Vitamin D (CALCIUM 600 + D PO) Take  by mouth daily.        . Cholecalciferol (VITAMIN D3) 2000 UNITS TABS Take by mouth daily.        . Coenzyme Q10 (CO Q10) 100 MG TABS Take 200 mg by mouth every morning.      . DULoxetine (CYMBALTA) 60 MG capsule Take 60 mg by mouth daily.        Marland Kitchen EVISTA 60 MG tablet Daily.      Marland Kitchen MAGNESIUM PO Take 400 mg by mouth daily.        . meloxicam (MOBIC) 15 MG tablet Take 15 mg by mouth as needed.        . Multiple Vitamin (MULTIVITAMIN) tablet Take 1 tablet by mouth daily.        . Omega-3 Fatty Acids (FISH OIL PO) Take 1,200 mg by mouth daily.        . chlorthalidone (HYGROTON) 25 MG tablet Daily.      Marland Kitchen KLOR-CON 10 10 MEQ CR tablet Daily.      . mupirocin (BACTROBAN) 2 % ointment BID times 48H.        Physical Examination  Filed Vitals:   12/25/11 1051  BP: 106/69  Pulse: 59  Resp:     Body mass index is 21.58 kg/(m^2).  General:  WDWN in NAD Gait: Normal HEENT: WNL Eyes:  Pupils equal Pulmonary: normal non-labored breathing , without Rales, rhonchi,  wheezing Cardiac: RRR, without  Murmurs, rubs or gallops; Abdomen: soft, NT, no masses Skin: no rashes, ulcers noted  Vascular Exam Pulses: 2+ radial pulses bilaterally Carotid bruits: Carotid pulses to auscultation no bruits are heard Extremities without ischemic changes, no Gangrene , no cellulitis; no open wounds;  Musculoskeletal: no muscle wasting or atrophy   Neurologic: A&O X 3; Appropriate Affect ; SENSATION: normal; MOTOR FUNCTION:  moving all extremities equally. Speech is fluent/normal  Non-Invasive Vascular Imaging CAROTID DUPLEX 12/25/2011  Right ICA 40 - 59 % stenosis Left ICA 60 - 79 % stenosis   ASSESSMENT/PLAN: asymptomatic known carotid stenosis, the patient will followup in 6 months for repeat carotid duplex given her left ICA is now 60-79%, she is in agreement with this her questions were encouraged and answered.  Lauree Chandler ANP   Clinic MD: Edilia Bo

## 2012-01-01 NOTE — Procedures (Unsigned)
CAROTID DUPLEX EXAM  INDICATION:  Carotid stenosis.  HISTORY: Diabetes:  No. Cardiac:  No. Hypertension:  Yes. Smoking:  No. Previous Surgery:  No carotid intervention. CV History:  Currently asymptomatic. Amaurosis Fugax No, Paresthesias No, hemiparesis No                                      RIGHT             LEFT Brachial systolic pressure:         120               126 Brachial Doppler waveforms:         WNL               WNL Vertebral direction of flow:        Antegrade         Antegrade DUPLEX VELOCITIES (cm/sec) CCA peak systolic                   99                81 ECA peak systolic                   88                74 ICA peak systolic                   132               213 ICA end diastolic                   41                75 PLAQUE MORPHOLOGY:                  Heterogenous      Calcified PLAQUE AMOUNT:                      Moderate          Moderate to severe PLAQUE LOCATION:                    CCA/ICA           CCA/ICA  IMPRESSION: 1. Right internal carotid artery stenosis present in the 40-59% range. 2. Bilateral external carotid arteries appear patent. 3. Left internal carotid artery stenosis present in the 60-79% range,     may be underestimated due to calcific plaque making Doppler     interrogation difficult. 4. Bilateral vertebral arteries are patent and antegrade. 5. Right internal carotid artery stenosis is stable and left is     increased since previous study on 12/19/2010; however, velocities     and stenosis appear consistent with study performed 06/20/2010.  ___________________________________________ [name]. Edilia Bo, M.D.  SH/MEDQ  D:  12/25/2011  T:  12/25/2011  Job:  [id number]

## 2012-07-08 HISTORY — PX: JOINT REPLACEMENT: SHX530

## 2012-07-14 ENCOUNTER — Other Ambulatory Visit: Payer: Medicare Other

## 2012-07-14 ENCOUNTER — Ambulatory Visit: Payer: Medicare Other | Admitting: Neurosurgery

## 2012-07-22 ENCOUNTER — Encounter: Payer: Self-pay | Admitting: Neurosurgery

## 2012-07-23 ENCOUNTER — Ambulatory Visit (INDEPENDENT_AMBULATORY_CARE_PROVIDER_SITE_OTHER): Payer: Medicare Other | Admitting: Neurosurgery

## 2012-07-23 ENCOUNTER — Other Ambulatory Visit (INDEPENDENT_AMBULATORY_CARE_PROVIDER_SITE_OTHER): Payer: Medicare Other | Admitting: *Deleted

## 2012-07-23 ENCOUNTER — Encounter: Payer: Self-pay | Admitting: Neurosurgery

## 2012-07-23 VITALS — BP 139/74 | HR 54 | Resp 16 | Ht 61.5 in | Wt 113.0 lb

## 2012-07-23 DIAGNOSIS — I6529 Occlusion and stenosis of unspecified carotid artery: Secondary | ICD-10-CM

## 2012-07-23 NOTE — Progress Notes (Signed)
[facility] [place] Carotid Office Note  CC:  Carotid surveillance Referring Physician: Edilia Bo  History of Present Illness: 72 year old female patient of Dr. Adele Dan followed for known carotid stenosis. The patient denies any signs or symptoms of CVA, TIA, amaurosis fugax or any neural deficit. The patient denies any new medical diagnoses or recent surgery.  Past Medical History  Diagnosis Date  . Arthritis   . Hypertension   . Pilonidal cyst   . Sleep difficulties   . Ear congestion   . Carotid arterial disease   . Osteopenia   . Hyperlipidemia   . Osteoarthritis     ROS: [x]  Positive   [ ]  Denies    General: [ ]  Weight loss, [ ]  Fever, [ ]  chills Neurologic: [ ]  Dizziness, [ ]  Blackouts, [ ]  Seizure [ ]  Stroke, [ ]  "Mini stroke", [ ]  Slurred speech, [ ]  Temporary blindness; [ ]  weakness in arms or legs, [ ]  Hoarseness Cardiac: [ ]  Chest pain/pressure, [ ]  Shortness of breath at rest [ ]  Shortness of breath with exertion, [ ]  Atrial fibrillation or irregular heartbeat Vascular: [ ]  Pain in legs with walking, [ ]  Pain in legs at rest, [ ]  Pain in legs at night,  [ ]  Non-healing ulcer, [ ]  Blood clot in vein/DVT,   Pulmonary: [ ]  Home oxygen, [ ]  Productive cough, [ ]  Coughing up blood, [ ]  Asthma,  [ ]  Wheezing Musculoskeletal:  [ ]  Arthritis, [ ]  Low back pain, [ ]  Joint pain Hematologic: [ ]  Easy Bruising, [ ]  Anemia; [ ]  Hepatitis Gastrointestinal: [ ]  Blood in stool, [ ]  Gastroesophageal Reflux/heartburn, [ ]  Trouble swallowing Urinary: [ ]  chronic Kidney disease, [ ]  on HD - [ ]  MWF or [ ]  TTHS, [ ]  Burning with urination, [ ]  Difficulty urinating Skin: [ ]  Rashes, [ ]  Wounds Psychological: [ ]  Anxiety, [ ]  Depression   Social History History  Substance Use Topics  . Smoking status: Unknown If Ever Smoked  . Smokeless tobacco: Never Used  . Alcohol Use: Yes    Family History Family History  Problem Relation Age of Onset  . Heart failure  Mother   . Cancer Mother   . Hypertension Mother   . Other Mother     Varicose Veins  . Diabetes Father   . Heart disease Father   . Hypertension Father   . Heart attack Father     No Known Allergies  Current Outpatient Prescriptions  Medication Sig Dispense Refill  . Acetylcarnitine HCl (ACETYL L-CARNITINE) 500 MG CAPS Take by mouth daily.      Marland Kitchen ALPRAZolam (XANAX) 0.5 MG tablet Take 0.25 mg by mouth at bedtime as needed.        . Ascorbic Acid (VITAMIN C) 500 MG CAPS Take by mouth daily.        Marland Kitchen aspirin 81 MG tablet Take 81 mg by mouth daily.        Marland Kitchen atorvastatin (LIPITOR) 20 MG tablet Take 20 mg by mouth daily.        . bisoprolol-hydrochlorothiazide (ZIAC) 2.5-6.25 MG per tablet Daily.      . Calcium Carbonate-Vitamin D (CALCIUM 600 + D PO) Take by mouth daily.        . Cholecalciferol (VITAMIN D3) 2000 UNITS TABS Take by mouth daily.        . Coenzyme Q10 (CO Q10) 100 MG TABS Take 200 mg by mouth every morning.      Marland Kitchen  DULoxetine (CYMBALTA) 60 MG capsule Take 60 mg by mouth daily.        Marland Kitchen EVISTA 60 MG tablet Daily.      Marland Kitchen MAGNESIUM PO Take 400 mg by mouth daily.        . meloxicam (MOBIC) 15 MG tablet Take 15 mg by mouth as needed.        . Multiple Vitamin (MULTIVITAMIN) tablet Take 1 tablet by mouth daily.        . Omega-3 Fatty Acids (FISH OIL PO) Take 1,200 mg by mouth daily.        . chlorthalidone (HYGROTON) 25 MG tablet Daily.      Marland Kitchen KLOR-CON 10 10 MEQ CR tablet Daily.      . mupirocin (BACTROBAN) 2 % ointment BID times 48H.        Physical Examination  Filed Vitals:   07/23/12 1525  BP: 139/74  Pulse: 54  Resp:     Body mass index is 21.01 kg/(m^2).  General:  WDWN in NAD Gait: Normal HEENT: WNL Eyes: Pupils equal Pulmonary: normal non-labored breathing , without Rales, rhonchi,  wheezing Cardiac: RRR, without  Murmurs, rubs or gallops; Abdomen: soft, NT, no masses Skin: no rashes, ulcers noted  Vascular Exam Pulses: 3+ radial pulses  bilaterally Carotid bruits: Carotid pulses to auscultation no bruits are heard Extremities without ischemic changes, no Gangrene , no cellulitis; no open wounds;  Musculoskeletal: no muscle wasting or atrophy   Neurologic: A&O X 3; Appropriate Affect ; SENSATION: normal; MOTOR FUNCTION:  moving all extremities equally. Speech is fluent/normal  Non-Invasive Vascular Imaging CAROTID DUPLEX 07/23/2012  Right ICA 20 - 39 % stenosis Left ICA 40 - 59 % stenosis   ASSESSMENT/PLAN: Asymptomatic patient with a diminished duplex especially on the left when she has been reading in the 60s 79% range. I did explain to the patient we should follow her on six-month basis due to calcification in the vessels which may be underestimating the Doppler. The patient states understanding she will followup in 6 months. The patient's questions were encouraged and answered.  Lauree Chandler ANP   Clinic MD: Darrick Penna

## 2012-07-24 NOTE — Addendum Note (Signed)
Addended by: Sharee Pimple on: 07/24/2012 08:08 AM   Modules accepted: Orders

## 2012-08-05 ENCOUNTER — Other Ambulatory Visit (HOSPITAL_COMMUNITY): Payer: Self-pay | Admitting: Orthopaedic Surgery

## 2012-08-18 ENCOUNTER — Encounter (HOSPITAL_COMMUNITY): Payer: Self-pay | Admitting: Pharmacy Technician

## 2012-08-20 ENCOUNTER — Other Ambulatory Visit (HOSPITAL_COMMUNITY): Payer: Self-pay | Admitting: *Deleted

## 2012-08-20 NOTE — Patient Instructions (Addendum)
Shannon Berger  08/20/2012                           YOUR PROCEDURE IS SCHEDULED ON: 09/04/12               PLEASE REPORT TO SHORT STAY CENTER AT :  5:15 AM               CALL THIS NUMBER IF ANY PROBLEMS THE DAY OF SURGERY :               [phone]                      REMEMBER:   Do not eat food or drink liquids AFTER MIDNIGHT              Take these medicines the morning of surgery with A SIP OF WATER: EVISTA   Do not wear jewelry, make-up   Do not wear lotions, powders, or perfumes.   Do not shave legs or underarms 12 hrs. before surgery (men may shave face)  Do not bring valuables to the hospital.  Contacts, dentures or bridgework may not be worn into surgery.  Leave suitcase in the car. After surgery it may be brought to your room.  For patients admitted to the hospital more than one night, checkout time is 11:00                          The day of discharge.   Patients discharged the day of surgery will not be allowed to drive home                             If going home same day of surgery, must have someone stay with you first                           24 hrs at home and arrange for some one to drive you home from hospital.    Special Instructions:   Please read over the following fact sheets that you were given:               1. MRSA  INFORMATION                      2. [place] PREPARING FOR SURGERY SHEET                                                X_____________________________________________________________________        Failure to follow these instructions may result in cancellation of your surgery

## 2012-08-21 ENCOUNTER — Inpatient Hospital Stay (HOSPITAL_COMMUNITY): Admission: RE | Admit: 2012-08-21 | Payer: Medicare Other | Source: Ambulatory Visit

## 2012-08-21 ENCOUNTER — Encounter (HOSPITAL_COMMUNITY): Payer: Self-pay

## 2012-08-21 ENCOUNTER — Ambulatory Visit (HOSPITAL_COMMUNITY)
Admission: RE | Admit: 2012-08-21 | Discharge: 2012-08-21 | Disposition: A | Discharge status: Home / Self Care | Payer: Medicare Other | Source: Ambulatory Visit | Attending: Orthopaedic Surgery | Admitting: Orthopaedic Surgery

## 2012-08-21 ENCOUNTER — Encounter (HOSPITAL_COMMUNITY)
Admission: RE | Admit: 2012-08-21 | Discharge: 2012-08-21 | Disposition: A | Discharge status: Home / Self Care | Payer: Medicare Other | Source: Ambulatory Visit | Attending: Orthopaedic Surgery | Admitting: Orthopaedic Surgery

## 2012-08-21 DIAGNOSIS — Z01810 Encounter for preprocedural cardiovascular examination: Secondary | ICD-10-CM | POA: Insufficient documentation

## 2012-08-21 DIAGNOSIS — IMO0002 Reserved for concepts with insufficient information to code with codable children: Secondary | ICD-10-CM | POA: Insufficient documentation

## 2012-08-21 DIAGNOSIS — M169 Osteoarthritis of hip, unspecified: Secondary | ICD-10-CM | POA: Insufficient documentation

## 2012-08-21 DIAGNOSIS — M1610 Unilateral primary osteoarthritis, unspecified hip: Secondary | ICD-10-CM | POA: Insufficient documentation

## 2012-08-21 DIAGNOSIS — Z01812 Encounter for preprocedural laboratory examination: Secondary | ICD-10-CM | POA: Insufficient documentation

## 2012-08-21 DIAGNOSIS — M5137 Other intervertebral disc degeneration, lumbosacral region: Secondary | ICD-10-CM | POA: Insufficient documentation

## 2012-08-21 DIAGNOSIS — M51379 Other intervertebral disc degeneration, lumbosacral region without mention of lumbar back pain or lower extremity pain: Secondary | ICD-10-CM | POA: Insufficient documentation

## 2012-08-21 HISTORY — DX: Constipation, unspecified: K59.00

## 2012-08-21 LAB — BASIC METABOLIC PANEL
BUN: 22 mg/dL (ref 6–23)
CO2: 30 mEq/L (ref 19–32)
Calcium: 10 mg/dL (ref 8.4–10.5)
Chloride: 99 mEq/L (ref 96–112)
Creatinine, Ser: 0.7 mg/dL (ref 0.50–1.10)
GFR calc Af Amer: >90 mL/min (ref >90)
GFR calc non Af Amer: 85 mL/min — ABNORMAL LOW (ref >90)
Glucose, Bld: 96 mg/dL (ref 70–99)
Potassium: 4.7 mEq/L (ref 3.5–5.1)
Sodium: 139 mEq/L (ref 135–145)

## 2012-08-21 LAB — URINALYSIS, ROUTINE W REFLEX MICROSCOPIC
Bilirubin Urine: NEGATIVE
Glucose, UA: NEGATIVE mg/dL
Hgb urine dipstick: NEGATIVE
Ketones, ur: NEGATIVE mg/dL
Leukocytes, UA: NEGATIVE
Nitrite: NEGATIVE
Protein, ur: NEGATIVE mg/dL
Specific Gravity, Urine: 1.013 (ref 1.005–1.030)
Urobilinogen, UA: 0.2 mg/dL (ref 0.0–1.0)
pH: 7.5 (ref 5.0–8.0)

## 2012-08-21 LAB — CBC
HCT: 38.1 % (ref 36.0–46.0)
Hemoglobin: 13.1 g/dL (ref 12.0–15.0)
MCH: 33.8 pg (ref 26.0–34.0)
MCHC: 34.4 g/dL (ref 30.0–36.0)
MCV: 98.2 fL (ref 78.0–100.0)
Platelets: 339 10*3/uL (ref 150–400)
RBC: 3.88 MIL/uL (ref 3.87–5.11)
RDW: 12.9 % (ref 11.5–15.5)
WBC: 6 10*3/uL (ref 4.0–10.5)

## 2012-08-21 LAB — PROTIME-INR
INR: 0.92 (ref 0.00–1.49)
Prothrombin Time: 12.3 seconds (ref 11.6–15.2)

## 2012-08-21 LAB — APTT: aPTT: 26 seconds (ref 24–37)

## 2012-08-21 LAB — SURGICAL PCR SCREEN
MRSA, PCR: NEGATIVE
Staphylococcus aureus: NEGATIVE

## 2012-09-04 ENCOUNTER — Inpatient Hospital Stay (HOSPITAL_COMMUNITY): Payer: Medicare Other | Admitting: Registered Nurse

## 2012-09-04 ENCOUNTER — Encounter (HOSPITAL_COMMUNITY): Payer: Self-pay | Admitting: *Deleted

## 2012-09-04 ENCOUNTER — Inpatient Hospital Stay (HOSPITAL_COMMUNITY): Payer: Medicare Other

## 2012-09-04 ENCOUNTER — Encounter (HOSPITAL_COMMUNITY)
Admission: RE | Disposition: A | Discharge status: Home Health | Payer: Self-pay | Source: Ambulatory Visit | Attending: Orthopaedic Surgery

## 2012-09-04 ENCOUNTER — Inpatient Hospital Stay (HOSPITAL_COMMUNITY)
Admission: RE | Admit: 2012-09-04 | Discharge: 2012-09-07 | DRG: 470 | Disposition: A | Discharge status: Home Health | Payer: Medicare Other | Source: Ambulatory Visit | Attending: Orthopaedic Surgery | Admitting: Orthopaedic Surgery

## 2012-09-04 ENCOUNTER — Encounter (HOSPITAL_COMMUNITY): Payer: Self-pay | Admitting: Registered Nurse

## 2012-09-04 DIAGNOSIS — M169 Osteoarthritis of hip, unspecified: Secondary | ICD-10-CM | POA: Diagnosis present

## 2012-09-04 DIAGNOSIS — M1610 Unilateral primary osteoarthritis, unspecified hip: Principal | ICD-10-CM | POA: Diagnosis present

## 2012-09-04 DIAGNOSIS — I10 Essential (primary) hypertension: Secondary | ICD-10-CM | POA: Diagnosis present

## 2012-09-04 HISTORY — PX: TOTAL HIP ARTHROPLASTY: SHX124

## 2012-09-04 LAB — ABO/RH: ABO/RH(D): O NEG

## 2012-09-04 LAB — TYPE AND SCREEN
ABO/RH(D): O NEG
Antibody Screen: NEGATIVE

## 2012-09-04 SURGERY — ARTHROPLASTY, HIP, TOTAL, ANTERIOR APPROACH
Anesthesia: General | Site: Hip | Laterality: Left | Wound class: Clean

## 2012-09-04 MED ORDER — STERILE WATER FOR IRRIGATION IR SOLN
Status: DC | PRN
  Administered 2012-09-04: 3000 mL

## 2012-09-04 MED ORDER — PHENOL 1.4 % MT LIQD
1.0000 | OROMUCOSAL | Status: DC | PRN
  Filled 2012-09-04: qty 177

## 2012-09-04 MED ORDER — METHOCARBAMOL 500 MG PO TABS
500.0000 mg | ORAL_TABLET | Freq: Four times a day (QID) | ORAL | Status: DC | PRN
  Administered 2012-09-04: 500 mg via ORAL
  Filled 2012-09-04: qty 1

## 2012-09-04 MED ORDER — ATORVASTATIN CALCIUM 20 MG PO TABS
20.0000 mg | ORAL_TABLET | Freq: Every day | ORAL | Status: DC
  Administered 2012-09-04 – 2012-09-06 (×3): 20 mg via ORAL
  Filled 2012-09-04 (×4): qty 1

## 2012-09-04 MED ORDER — METOCLOPRAMIDE HCL 5 MG/ML IJ SOLN: 5.0000 mg | Freq: Three times a day (TID) | INTRAMUSCULAR | Status: DC | PRN

## 2012-09-04 MED ORDER — 0.9 % SODIUM CHLORIDE (POUR BTL) OPTIME
TOPICAL | Status: DC | PRN
  Administered 2012-09-04: 1000 mL

## 2012-09-04 MED ORDER — ONDANSETRON HCL 4 MG/2ML IJ SOLN
INTRAMUSCULAR | Status: DC | PRN
  Administered 2012-09-04: 4 mg via INTRAVENOUS

## 2012-09-04 MED ORDER — DULOXETINE HCL 60 MG PO CPEP
60.0000 mg | ORAL_CAPSULE | Freq: Every day | ORAL | Status: DC
  Administered 2012-09-04 – 2012-09-06 (×3): 60 mg via ORAL
  Filled 2012-09-04 (×4): qty 1

## 2012-09-04 MED ORDER — ONDANSETRON HCL 4 MG/2ML IJ SOLN: 4.0000 mg | Freq: Four times a day (QID) | INTRAMUSCULAR | Status: DC | PRN

## 2012-09-04 MED ORDER — SODIUM CHLORIDE 0.9 % IV SOLN
INTRAVENOUS | Status: DC | PRN
  Administered 2012-09-04: 1000 mL

## 2012-09-04 MED ORDER — PROPOFOL INFUSION 10 MG/ML OPTIME
INTRAVENOUS | Status: DC | PRN
  Administered 2012-09-04: 50 ug/kg/min via INTRAVENOUS

## 2012-09-04 MED ORDER — CO Q10 100 MG PO TABS: 200.0000 mg | ORAL_TABLET | Freq: Every morning | ORAL | Status: DC

## 2012-09-04 MED ORDER — PHENYLEPHRINE HCL 10 MG/ML IJ SOLN
20.0000 mg | INTRAVENOUS | Status: DC | PRN
  Administered 2012-09-04: 15 ug/min via INTRAVENOUS

## 2012-09-04 MED ORDER — MENTHOL 3 MG MT LOZG
1.0000 | LOZENGE | OROMUCOSAL | Status: DC | PRN
  Filled 2012-09-04: qty 9

## 2012-09-04 MED ORDER — BISOPROLOL-HYDROCHLOROTHIAZIDE 2.5-6.25 MG PO TABS: 1.0000 | ORAL_TABLET | Freq: Every morning | ORAL | Status: DC

## 2012-09-04 MED ORDER — BISOPROLOL FUMARATE 5 MG PO TABS
2.5000 mg | ORAL_TABLET | Freq: Once | ORAL | Status: DC
  Filled 2012-09-04: qty 0.5

## 2012-09-04 MED ORDER — LACTATED RINGERS IV SOLN
INTRAVENOUS | Status: DC | PRN
  Administered 2012-09-04 (×2): via INTRAVENOUS

## 2012-09-04 MED ORDER — ALPRAZOLAM 0.25 MG PO TABS
0.2500 mg | ORAL_TABLET | Freq: Every evening | ORAL | Status: DC | PRN
  Administered 2012-09-04 – 2012-09-06 (×2): 0.25 mg via ORAL
  Filled 2012-09-04 (×2): qty 1

## 2012-09-04 MED ORDER — RALOXIFENE HCL 60 MG PO TABS
60.0000 mg | ORAL_TABLET | Freq: Every morning | ORAL | Status: DC
  Administered 2012-09-05 – 2012-09-06 (×2): 60 mg via ORAL
  Filled 2012-09-04 (×3): qty 1

## 2012-09-04 MED ORDER — OXYCODONE HCL ER 10 MG PO T12A
10.0000 mg | EXTENDED_RELEASE_TABLET | Freq: Two times a day (BID) | ORAL | Status: DC
  Administered 2012-09-04 – 2012-09-06 (×4): 10 mg via ORAL
  Filled 2012-09-04 (×6): qty 1

## 2012-09-04 MED ORDER — VITAMIN D3 25 MCG (1000 UNIT) PO TABS
2000.0000 [IU] | ORAL_TABLET | Freq: Every day | ORAL | Status: DC
  Administered 2012-09-05 – 2012-09-06 (×2): 2000 [IU] via ORAL
  Filled 2012-09-04 (×3): qty 2

## 2012-09-04 MED ORDER — HYDROMORPHONE HCL PF 1 MG/ML IJ SOLN: 0.2500 mg | INTRAMUSCULAR | Status: DC | PRN

## 2012-09-04 MED ORDER — OXYCODONE HCL 5 MG PO TABS
5.0000 mg | ORAL_TABLET | ORAL | Status: DC | PRN
  Administered 2012-09-04 (×2): 5 mg via ORAL
  Administered 2012-09-05 (×2): 10 mg via ORAL
  Filled 2012-09-04 (×2): qty 1
  Filled 2012-09-04 (×2): qty 2

## 2012-09-04 MED ORDER — LIDOCAINE HCL (CARDIAC) 20 MG/ML IV SOLN
INTRAVENOUS | Status: DC | PRN
  Administered 2012-09-04: 100 mg via INTRAVENOUS

## 2012-09-04 MED ORDER — SODIUM CHLORIDE 0.9 % IV SOLN
INTRAVENOUS | Status: DC
  Administered 2012-09-04: 11:00:00 via INTRAVENOUS

## 2012-09-04 MED ORDER — MIDAZOLAM HCL 5 MG/5ML IJ SOLN
INTRAMUSCULAR | Status: DC | PRN
  Administered 2012-09-04: 2 mg via INTRAVENOUS

## 2012-09-04 MED ORDER — CEFAZOLIN SODIUM-DEXTROSE 2-3 GM-% IV SOLR
2.0000 g | INTRAVENOUS | Status: AC
  Administered 2012-09-04: 2 g via INTRAVENOUS

## 2012-09-04 MED ORDER — KETOROLAC TROMETHAMINE 15 MG/ML IJ SOLN
7.5000 mg | Freq: Four times a day (QID) | INTRAMUSCULAR | Status: AC
  Administered 2012-09-04: 7.5 mg via INTRAVENOUS
  Filled 2012-09-04: qty 1

## 2012-09-04 MED ORDER — ACETAMINOPHEN 325 MG PO TABS
650.0000 mg | ORAL_TABLET | Freq: Four times a day (QID) | ORAL | Status: DC | PRN
  Administered 2012-09-06 – 2012-09-07 (×2): 650 mg via ORAL
  Filled 2012-09-04 (×2): qty 2

## 2012-09-04 MED ORDER — CEFAZOLIN SODIUM 1-5 GM-% IV SOLN
1.0000 g | Freq: Four times a day (QID) | INTRAVENOUS | Status: AC
  Administered 2012-09-04 (×2): 1 g via INTRAVENOUS
  Filled 2012-09-04 (×2): qty 50

## 2012-09-04 MED ORDER — PHENYLEPHRINE HCL 10 MG/ML IJ SOLN
INTRAMUSCULAR | Status: DC | PRN
  Administered 2012-09-04: 40 ug via INTRAVENOUS

## 2012-09-04 MED ORDER — ADULT MULTIVITAMIN W/MINERALS CH
1.0000 | ORAL_TABLET | Freq: Every day | ORAL | Status: DC
  Administered 2012-09-05 – 2012-09-06 (×2): 1 via ORAL
  Filled 2012-09-04 (×3): qty 1

## 2012-09-04 MED ORDER — ZOLPIDEM TARTRATE 5 MG PO TABS: 5.0000 mg | ORAL_TABLET | Freq: Every evening | ORAL | Status: DC | PRN

## 2012-09-04 MED ORDER — ACETAMINOPHEN 650 MG RE SUPP: 650.0000 mg | Freq: Four times a day (QID) | RECTAL | Status: DC | PRN

## 2012-09-04 MED ORDER — TURMERIC 450 MG PO CAPS: 9000.0000 mg | ORAL_CAPSULE | ORAL | Status: DC

## 2012-09-04 MED ORDER — METHOCARBAMOL 100 MG/ML IJ SOLN: 500.0000 mg | Freq: Four times a day (QID) | INTRAVENOUS | Status: DC | PRN

## 2012-09-04 MED ORDER — ACETAMINOPHEN 10 MG/ML IV SOLN
INTRAVENOUS | Status: DC | PRN
  Administered 2012-09-04: 1000 mg via INTRAVENOUS

## 2012-09-04 MED ORDER — DOCUSATE SODIUM 100 MG PO CAPS
100.0000 mg | ORAL_CAPSULE | Freq: Two times a day (BID) | ORAL | Status: DC
  Administered 2012-09-04 – 2012-09-06 (×5): 100 mg via ORAL

## 2012-09-04 MED ORDER — PROMETHAZINE HCL 25 MG/ML IJ SOLN: 6.2500 mg | INTRAMUSCULAR | Status: DC | PRN

## 2012-09-04 MED ORDER — BISOPROLOL-HYDROCHLOROTHIAZIDE 2.5-6.25 MG PO TABS
1.0000 | ORAL_TABLET | Freq: Every morning | ORAL | Status: DC
  Administered 2012-09-05: 1 via ORAL
  Filled 2012-09-04 (×4): qty 1

## 2012-09-04 MED ORDER — ACETYL L-CARNITINE 500 MG PO CAPS: 500.0000 mg | ORAL_CAPSULE | Freq: Every day | ORAL | Status: DC

## 2012-09-04 MED ORDER — ASPIRIN EC 325 MG PO TBEC
325.0000 mg | DELAYED_RELEASE_TABLET | Freq: Every day | ORAL | Status: DC
  Administered 2012-09-05 – 2012-09-06 (×2): 325 mg via ORAL
  Filled 2012-09-04 (×4): qty 1

## 2012-09-04 MED ORDER — ONDANSETRON HCL 4 MG PO TABS
4.0000 mg | ORAL_TABLET | Freq: Four times a day (QID) | ORAL | Status: DC | PRN
  Administered 2012-09-05: 4 mg via ORAL
  Filled 2012-09-04: qty 1

## 2012-09-04 MED ORDER — ALUM & MAG HYDROXIDE-SIMETH 200-200-20 MG/5ML PO SUSP: 30.0000 mL | ORAL | Status: DC | PRN

## 2012-09-04 MED ORDER — HYDROMORPHONE HCL PF 1 MG/ML IJ SOLN: 0.5000 mg | INTRAMUSCULAR | Status: DC | PRN

## 2012-09-04 MED ORDER — METOCLOPRAMIDE HCL 10 MG PO TABS: 5.0000 mg | ORAL_TABLET | Freq: Three times a day (TID) | ORAL | Status: DC | PRN

## 2012-09-04 MED ORDER — FENTANYL CITRATE 0.05 MG/ML IJ SOLN
INTRAMUSCULAR | Status: DC | PRN
  Administered 2012-09-04: 50 ug via INTRAVENOUS

## 2012-09-04 MED ORDER — DIPHENHYDRAMINE HCL 12.5 MG/5ML PO ELIX: 12.5000 mg | ORAL_SOLUTION | ORAL | Status: DC | PRN

## 2012-09-04 MED ORDER — BUPIVACAINE IN DEXTROSE 0.75-8.25 % IT SOLN
INTRATHECAL | Status: DC | PRN
  Administered 2012-09-04: 1.7 mL via INTRATHECAL

## 2012-09-04 SURGICAL SUPPLY — 39 items
BAG ZIPLOCK 12X15 (MISCELLANEOUS) ×4 IMPLANT
BLADE SAW SGTL 18X1.27X75 (BLADE) ×2 IMPLANT
CELLS DAT CNTRL 66122 CELL SVR (MISCELLANEOUS) ×1 IMPLANT
CLOTH BEACON ORANGE TIMEOUT ST (SAFETY) ×2 IMPLANT
DERMABOND ADVANCED (GAUZE/BANDAGES/DRESSINGS) ×1
DERMABOND ADVANCED .7 DNX12 (GAUZE/BANDAGES/DRESSINGS) ×1 IMPLANT
DRAPE C-ARM 42X72 X-RAY (DRAPES) ×2 IMPLANT
DRAPE STERI IOBAN 125X83 (DRAPES) ×2 IMPLANT
DRAPE U-SHAPE 47X51 STRL (DRAPES) ×6 IMPLANT
DRSG AQUACEL AG ADV 3.5X10 (GAUZE/BANDAGES/DRESSINGS) ×2 IMPLANT
DURAPREP 26ML APPLICATOR (WOUND CARE) ×2 IMPLANT
ELECT BLADE TIP CTD 4 INCH (ELECTRODE) ×2 IMPLANT
ELECT REM PT RETURN 9FT ADLT (ELECTROSURGICAL) ×2
ELECTRODE REM PT RTRN 9FT ADLT (ELECTROSURGICAL) ×1 IMPLANT
FACESHIELD LNG OPTICON STERILE (SAFETY) ×8 IMPLANT
GLOVE BIO SURGEON STRL SZ7 (GLOVE) ×2 IMPLANT
GLOVE BIO SURGEON STRL SZ7.5 (GLOVE) ×2 IMPLANT
GLOVE BIOGEL PI IND STRL 7.5 (GLOVE) IMPLANT
GLOVE BIOGEL PI IND STRL 8 (GLOVE) ×1 IMPLANT
GLOVE BIOGEL PI INDICATOR 7.5 (GLOVE)
GLOVE BIOGEL PI INDICATOR 8 (GLOVE) ×1
GLOVE ECLIPSE 7.0 STRL STRAW (GLOVE) ×2 IMPLANT
GOWN STRL REIN XL XLG (GOWN DISPOSABLE) ×4 IMPLANT
HANDPIECE INTERPULSE COAX TIP (DISPOSABLE) ×1
KIT BASIN OR (CUSTOM PROCEDURE TRAY) ×2 IMPLANT
PACK TOTAL JOINT (CUSTOM PROCEDURE TRAY) ×2 IMPLANT
PADDING CAST COTTON 6X4 STRL (CAST SUPPLIES) ×2 IMPLANT
RTRCTR WOUND ALEXIS 18CM MED (MISCELLANEOUS) ×2
SET HNDPC FAN SPRY TIP SCT (DISPOSABLE) ×1 IMPLANT
SUT ETHIBOND NAB CT1 #1 30IN (SUTURE) ×4 IMPLANT
SUT MNCRL AB 4-0 PS2 18 (SUTURE) ×2 IMPLANT
SUT VIC AB 1 CT1 36 (SUTURE) ×4 IMPLANT
SUT VIC AB 2-0 CT1 27 (SUTURE) ×2
SUT VIC AB 2-0 CT1 TAPERPNT 27 (SUTURE) ×2 IMPLANT
SUT VLOC 180 0 24IN GS25 (SUTURE) ×2 IMPLANT
TOWEL OR 17X26 10 PK STRL BLUE (TOWEL DISPOSABLE) ×4 IMPLANT
TOWEL OR NON WOVEN STRL DISP B (DISPOSABLE) ×2 IMPLANT
TRAY FOLEY CATH 14FRSI W/METER (CATHETERS) ×2 IMPLANT
WATER STERILE IRR 1500ML POUR (IV SOLUTION) ×4 IMPLANT

## 2012-09-04 NOTE — Progress Notes (Signed)
PHARMACIST - PHYSICIAN ORDER COMMUNICATION  CONCERNING: P&T Medication Policy on Herbal Medications  DESCRIPTION:  This patient's orders for: Carnitine, Co Q 10, Turmeric  have been noted.  These product(s) are classified as "herbal" or natural products. Due to a lack of definitive safety studies or FDA approval, nonstandard manufacturing practices, plus the potential risk of unknown drug-drug interactions while on inpatient medications, the Pharmacy and Therapeutics Committee does not permit the use of "herbal" or natural products of this type within [facility].   ACTION TAKEN: The pharmacy department is unable to verify these orders at this time  Please reevaluate patient's clinical condition at discharge and address if the herbal or natural product(s) should be resumed at that time.  Thank you, Otho Bellows PharmD 09/04/2012 1:03 PM

## 2012-09-04 NOTE — H&P (Signed)
TOTAL HIP ADMISSION H&P  Patient is admitted for left total hip arthroplasty.  Subjective:  Chief Complaint: left hip pain  HPI: Shannon Berger, 72 y.o. female, has a history of pain and functional disability in the left hip(s) due to arthritis and patient has failed non-surgical conservative treatments for greater than 12 weeks to include NSAID's and/or analgesics, use of assistive devices and activity modification.  Onset of symptoms was gradual starting 5 years ago with gradually worsening course since that time.The patient noted no past surgery on the left hip(s).  Patient currently rates pain in the left hip at 7 out of 10 with activity. Patient has night pain, worsening of pain with activity and weight bearing, trendelenberg gait, pain that interfers with activities of daily living, pain with passive range of motion and crepitus. Patient has evidence of subchondral cysts, subchondral sclerosis, periarticular osteophytes and joint space narrowing by imaging studies. This condition presents safety issues increasing the risk of falls.  There is no current active infection.  Patient Active Problem List   Diagnosis Date Noted  . Degenerative arthritis of hip 09/04/2012  . Occlusion and stenosis of carotid artery without mention of cerebral infarction 12/25/2011  . Chest pain 04/15/2011  . PVC (premature ventricular contraction) 04/15/2011  . Hypertension   . Carotid arterial disease   . Hyperlipidemia   . Pilonidal cyst 02/05/2011   Past Medical History  Diagnosis Date  . Arthritis   . Hypertension   . Sleep difficulties   . Carotid arterial disease   . Osteopenia   . Hyperlipidemia   . Osteoarthritis   . Constipation     Past Surgical History  Procedure Laterality Date  . Bunionectomy    . Tonsillectomy and adenoidectomy    . Tendon repair      left hip  . Foot surgery      Status post right foot surgery  . Other surgical history      post fusion of her right thumb   . Pilonidal cyst / sinus excision    . Hand fusion      Right thumb  . Tendon repair      Reattachment of tendon- Left hip    Prescriptions prior to admission  Medication Sig Dispense Refill  . ALPRAZolam (XANAX) 0.5 MG tablet Take 0.25 mg by mouth at bedtime as needed for sleep.       . Ascorbic Acid (VITAMIN C PO) Take 700 mg by mouth daily.      Marland Kitchen atorvastatin (LIPITOR) 20 MG tablet Take 20 mg by mouth at bedtime.       . bisoprolol-hydrochlorothiazide (ZIAC) 2.5-6.25 MG per tablet Take 1 tablet by mouth every morning.       . Cholecalciferol (VITAMIN D3) 2000 UNITS TABS Take 2,000 mg by mouth daily.       . DULoxetine (CYMBALTA) 60 MG capsule Take 60 mg by mouth at bedtime.       . raloxifene (EVISTA) 60 MG tablet Take 60 mg by mouth every morning.      . Turmeric 450 MG CAPS Take 9,000 mg by mouth every other day.      . Acetylcarnitine HCl (ACETYL L-CARNITINE) 500 MG CAPS Take 500 mg by mouth daily.       Marland Kitchen aspirin 81 MG tablet Take 81 mg by mouth at bedtime.       . Coenzyme Q10 (CO Q10) 100 MG TABS Take 200 mg by mouth every morning.      Marland Kitchen  MAGNESIUM PO Take 400 mg by mouth daily.       . meloxicam (MOBIC) 15 MG tablet Take 15 mg by mouth daily as needed for pain.       . Multiple Vitamin (MULTIVITAMIN) tablet Take 1 tablet by mouth daily.        . Omega-3 Fatty Acids (FISH OIL PO) Take 2,400 mg by mouth 2 (two) times daily.        No Known Allergies  History  Substance Use Topics  . Smoking status: Never Smoker   . Smokeless tobacco: Never Used  . Alcohol Use: Yes     Comment: 2 gl wine per week    Family History  Problem Relation Age of Onset  . Heart failure Mother   . Cancer Mother   . Hypertension Mother   . Other Mother     Varicose Veins  . Diabetes Father   . Heart disease Father   . Hypertension Father   . Heart attack Father      Review of Systems  Musculoskeletal: Positive for joint pain.  All other systems reviewed and are  negative.    Objective:  Physical Exam  Constitutional: She is oriented to person, place, and time. She appears well-developed and well-nourished.  HENT:  Head: Normocephalic and atraumatic.  Eyes: EOM are normal. Pupils are equal, round, and reactive to light.  Neck: Normal range of motion. Neck supple.  Cardiovascular: Normal rate and regular rhythm.   Respiratory: Effort normal and breath sounds normal.  GI: Soft. Bowel sounds are normal.  Musculoskeletal:       Left hip: She exhibits decreased range of motion, decreased strength, bony tenderness and crepitus.  Neurological: She is alert and oriented to person, place, and time.  Skin: Skin is warm and dry.  Psychiatric: She has a normal mood and affect.    Vital signs in last 24 hours: Temp:  [97.5 F (36.4 C)] 97.5 F (36.4 C) (02/28 0522) Pulse Rate:  [64] 64 (02/28 0522) Resp:  [18] 18 (02/28 0522) BP: (169)/(68) 169/68 mmHg (02/28 0522) SpO2:  [100 %] 100 % (02/28 0522)  Labs:   Estimated body mass index is 20.66 kg/(m^2) as calculated from the following:   Height as of 08/21/12: 5\' 2"  (1.575 m).   Weight as of 07/23/12: 51.256 kg (113 lb).   Imaging Review Plain radiographs demonstrate severe degenerative joint disease of the left hip(s). The bone quality appears to be good for age and reported activity level.  Assessment/Plan:  End stage arthritis, left hip(s)  The patient history, physical examination, clinical judgement of the provider and imaging studies are consistent with end stage degenerative joint disease of the left hip(s) and total hip arthroplasty is deemed medically necessary. The treatment options including medical management, injection therapy, arthroscopy and arthroplasty were discussed at length. The risks and benefits of total hip arthroplasty were presented and reviewed. The risks due to aseptic loosening, infection, stiffness, dislocation/subluxation,  thromboembolic complications and other  imponderables were discussed.  The patient acknowledged the explanation, agreed to proceed with the plan and consent was signed. Patient is being admitted for inpatient treatment for surgery, pain control, PT, OT, prophylactic antibiotics, VTE prophylaxis, progressive ambulation and ADL's and discharge planning.The patient is planning to be discharged home with home health services

## 2012-09-04 NOTE — H&P (Signed)
TOTAL HIP ADMISSION H&P  Patient is admitted for left total hip arthroplasty.  Subjective:  Chief Complaint: left hip pain  HPI: Shannon Berger, 72 y.o. female, has a history of pain and functional disability in the left hip(s) due to arthritis and patient has failed non-surgical conservative treatments for greater than 12 weeks to include NSAID's and/or analgesics, corticosteriod injections and activity modification.  Onset of symptoms was gradual starting 2 years ago with gradually worsening course since that time.The patient noted no past surgery on the left hip(s).  Patient currently rates pain in the left hip at 8 out of 10 with activity. Patient has night pain, worsening of pain with activity and weight bearing, pain that interfers with activities of daily living and pain with passive range of motion. Patient has evidence of subchondral cysts, subchondral sclerosis, periarticular osteophytes and joint space narrowing by imaging studies. This condition presents safety issues increasing the risk of falls.  There is no current active infection.  Patient Active Problem List   Diagnosis Date Noted  . Degenerative arthritis of hip 09/04/2012  . Occlusion and stenosis of carotid artery without mention of cerebral infarction 12/25/2011  . Chest pain 04/15/2011  . PVC (premature ventricular contraction) 04/15/2011  . Hypertension   . Carotid arterial disease   . Hyperlipidemia   . Pilonidal cyst 02/05/2011   Past Medical History  Diagnosis Date  . Arthritis   . Hypertension   . Sleep difficulties   . Carotid arterial disease   . Osteopenia   . Hyperlipidemia   . Osteoarthritis   . Constipation     Past Surgical History  Procedure Laterality Date  . Bunionectomy    . Tonsillectomy and adenoidectomy    . Tendon repair      left hip  . Foot surgery      Status post right foot surgery  . Other surgical history      post fusion of her right thumb  . Pilonidal cyst / sinus  excision    . Hand fusion      Right thumb  . Tendon repair      Reattachment of tendon- Left hip    Prescriptions prior to admission  Medication Sig Dispense Refill  . ALPRAZolam (XANAX) 0.5 MG tablet Take 0.25 mg by mouth at bedtime as needed for sleep.       . Ascorbic Acid (VITAMIN C PO) Take 700 mg by mouth daily.      Marland Kitchen atorvastatin (LIPITOR) 20 MG tablet Take 20 mg by mouth at bedtime.       . bisoprolol-hydrochlorothiazide (ZIAC) 2.5-6.25 MG per tablet Take 1 tablet by mouth every morning.       . Cholecalciferol (VITAMIN D3) 2000 UNITS TABS Take 2,000 mg by mouth daily.       . DULoxetine (CYMBALTA) 60 MG capsule Take 60 mg by mouth at bedtime.       . raloxifene (EVISTA) 60 MG tablet Take 60 mg by mouth every morning.      . Turmeric 450 MG CAPS Take 9,000 mg by mouth every other day.      . Acetylcarnitine HCl (ACETYL L-CARNITINE) 500 MG CAPS Take 500 mg by mouth daily.       Marland Kitchen aspirin 81 MG tablet Take 81 mg by mouth at bedtime.       . Coenzyme Q10 (CO Q10) 100 MG TABS Take 200 mg by mouth every morning.      Marland Kitchen MAGNESIUM PO Take  400 mg by mouth daily.       . meloxicam (MOBIC) 15 MG tablet Take 15 mg by mouth daily as needed for pain.       . Multiple Vitamin (MULTIVITAMIN) tablet Take 1 tablet by mouth daily.        . Omega-3 Fatty Acids (FISH OIL PO) Take 2,400 mg by mouth 2 (two) times daily.        No Known Allergies  History  Substance Use Topics  . Smoking status: Never Smoker   . Smokeless tobacco: Never Used  . Alcohol Use: Yes     Comment: 2 gl wine per week    Family History  Problem Relation Age of Onset  . Heart failure Mother   . Cancer Mother   . Hypertension Mother   . Other Mother     Varicose Veins  . Diabetes Father   . Heart disease Father   . Hypertension Father   . Heart attack Father      Review of Systems  Musculoskeletal: Positive for joint pain.  All other systems reviewed and are negative.    Objective:  Physical Exam   Constitutional: She is oriented to person, place, and time. She appears well-developed and well-nourished.  HENT:  Head: Normocephalic and atraumatic.  Eyes: EOM are normal. Pupils are equal, round, and reactive to light.  Neck: Normal range of motion. Neck supple.  Cardiovascular: Normal rate and regular rhythm.   Respiratory: Effort normal and breath sounds normal.  GI: Soft. Bowel sounds are normal.  Musculoskeletal:       Left hip: She exhibits decreased range of motion, bony tenderness and crepitus.  Neurological: She is alert and oriented to person, place, and time.  Skin: Skin is warm and dry.  Psychiatric: She has a normal mood and affect.    Vital signs in last 24 hours: Temp:  [97.5 F (36.4 C)] 97.5 F (36.4 C) (02/28 0522) Pulse Rate:  [64] 64 (02/28 0522) Resp:  [18] 18 (02/28 0522) BP: (169)/(68) 169/68 mmHg (02/28 0522) SpO2:  [100 %] 100 % (02/28 0522)  Labs:   Estimated body mass index is 20.66 kg/(m^2) as calculated from the following:   Height as of 08/21/12: 5\' 2"  (1.575 m).   Weight as of 07/23/12: 51.256 kg (113 lb).   Imaging Review Plain radiographs demonstrate severe degenerative joint disease of the left hip(s). The bone quality appears to be good for age and reported activity level.  Assessment/Plan:  End stage arthritis, left hip(s)  The patient history, physical examination, clinical judgement of the provider and imaging studies are consistent with end stage degenerative joint disease of the left hip(s) and total hip arthroplasty is deemed medically necessary. The treatment options including medical management, injection therapy, arthroscopy and arthroplasty were discussed at length. The risks and benefits of total hip arthroplasty were presented and reviewed. The risks due to aseptic loosening, infection, stiffness, dislocation/subluxation,  thromboembolic complications and other imponderables were discussed.  The patient acknowledged the  explanation, agreed to proceed with the plan and consent was signed. Patient is being admitted for inpatient treatment for surgery, pain control, PT, OT, prophylactic antibiotics, VTE prophylaxis, progressive ambulation and ADL's and discharge planning.The patient is planning to be discharged home with home health services

## 2012-09-04 NOTE — H&P (Signed)
TOTAL HIP ADMISSION H&P  Patient is admitted for right total hip arthroplasty.  Subjective:  Chief Complaint: right hip pain  HPI: Shannon Berger, 72 y.o. female, has a history of pain and functional disability in the right hip(s) due to arthritis and patient has failed non-surgical conservative treatments for greater than 12 weeks to include NSAID's and/or analgesics, flexibility and strengthening excercises, use of assistive devices, weight reduction as appropriate and activity modification.  Onset of symptoms was gradual starting 1 years ago with rapidlly worsening course since that time.The patient noted no past surgery on the right hip(s).  Patient currently rates pain in the right hip at 8 out of 10 with activity. Patient has night pain, worsening of pain with activity and weight bearing, trendelenberg gait, pain that interfers with activities of daily living and pain with passive range of motion. Patient has evidence of subchondral sclerosis, periarticular osteophytes and joint space narrowing by imaging studies. This condition presents safety issues increasing the risk of falls.  There is no current active infection.  Patient Active Problem List   Diagnosis Date Noted  . Degenerative arthritis of hip 09/04/2012  . Occlusion and stenosis of carotid artery without mention of cerebral infarction 12/25/2011  . Chest pain 04/15/2011  . PVC (premature ventricular contraction) 04/15/2011  . Hypertension   . Carotid arterial disease   . Hyperlipidemia   . Pilonidal cyst 02/05/2011   Past Medical History  Diagnosis Date  . Arthritis   . Hypertension   . Sleep difficulties   . Carotid arterial disease   . Osteopenia   . Hyperlipidemia   . Osteoarthritis   . Constipation     Past Surgical History  Procedure Laterality Date  . Bunionectomy    . Tonsillectomy and adenoidectomy    . Tendon repair      left hip  . Foot surgery      Status post right foot surgery  . Other  surgical history      post fusion of her right thumb  . Pilonidal cyst / sinus excision    . Hand fusion      Right thumb  . Tendon repair      Reattachment of tendon- Left hip    Prescriptions prior to admission  Medication Sig Dispense Refill  . ALPRAZolam (XANAX) 0.5 MG tablet Take 0.25 mg by mouth at bedtime as needed for sleep.       . Ascorbic Acid (VITAMIN C PO) Take 700 mg by mouth daily.      Marland Kitchen atorvastatin (LIPITOR) 20 MG tablet Take 20 mg by mouth at bedtime.       . bisoprolol-hydrochlorothiazide (ZIAC) 2.5-6.25 MG per tablet Take 1 tablet by mouth every morning.       . Cholecalciferol (VITAMIN D3) 2000 UNITS TABS Take 2,000 mg by mouth daily.       . DULoxetine (CYMBALTA) 60 MG capsule Take 60 mg by mouth at bedtime.       . raloxifene (EVISTA) 60 MG tablet Take 60 mg by mouth every morning.      . Turmeric 450 MG CAPS Take 9,000 mg by mouth every other day.      . Acetylcarnitine HCl (ACETYL L-CARNITINE) 500 MG CAPS Take 500 mg by mouth daily.       Marland Kitchen aspirin 81 MG tablet Take 81 mg by mouth at bedtime.       . Coenzyme Q10 (CO Q10) 100 MG TABS Take 200 mg by mouth every  morning.      Marland Kitchen MAGNESIUM PO Take 400 mg by mouth daily.       . meloxicam (MOBIC) 15 MG tablet Take 15 mg by mouth daily as needed for pain.       . Multiple Vitamin (MULTIVITAMIN) tablet Take 1 tablet by mouth daily.        . Omega-3 Fatty Acids (FISH OIL PO) Take 2,400 mg by mouth 2 (two) times daily.        No Known Allergies  History  Substance Use Topics  . Smoking status: Never Smoker   . Smokeless tobacco: Never Used  . Alcohol Use: Yes     Comment: 2 gl wine per week    Family History  Problem Relation Age of Onset  . Heart failure Mother   . Cancer Mother   . Hypertension Mother   . Other Mother     Varicose Veins  . Diabetes Father   . Heart disease Father   . Hypertension Father   . Heart attack Father      Review of Systems  Musculoskeletal: Positive for joint pain.  All  other systems reviewed and are negative.    Objective:  Physical Exam  Constitutional: She is oriented to person, place, and time. She appears well-developed and well-nourished.  HENT:  Head: Normocephalic and atraumatic.  Eyes: EOM are normal. Pupils are equal, round, and reactive to light.  Neck: Normal range of motion. Neck supple.  Cardiovascular: Normal rate and regular rhythm.   Respiratory: Effort normal and breath sounds normal.  GI: Soft. Bowel sounds are normal.  Musculoskeletal:       Right hip: She exhibits decreased range of motion, decreased strength, bony tenderness and crepitus.  Neurological: She is alert and oriented to person, place, and time.  Skin: Skin is warm and dry.  Psychiatric: She has a normal mood and affect.    Vital signs in last 24 hours: Temp:  [97.5 F (36.4 C)] 97.5 F (36.4 C) (02/28 0522) Pulse Rate:  [64] 64 (02/28 0522) Resp:  [18] 18 (02/28 0522) BP: (169)/(68) 169/68 mmHg (02/28 0522) SpO2:  [100 %] 100 % (02/28 0522)  Labs:   Estimated body mass index is 20.66 kg/(m^2) as calculated from the following:   Height as of 08/21/12: 5\' 2"  (1.575 m).   Weight as of 07/23/12: 51.256 kg (113 lb).   Imaging Review Plain radiographs demonstrate moderate degenerative joint disease of the right hip(s). The bone quality appears to be excellent for age and reported activity level.  Assessment/Plan:  End stage arthritis, right hip(s)  The patient history, physical examination, clinical judgement of the provider and imaging studies are consistent with end stage degenerative joint disease of the right hip(s) and total hip arthroplasty is deemed medically necessary. The treatment options including medical management, injection therapy, arthroscopy and arthroplasty were discussed at length. The risks and benefits of total hip arthroplasty were presented and reviewed. The risks due to aseptic loosening, infection, stiffness, dislocation/subluxation,   thromboembolic complications and other imponderables were discussed.  The patient acknowledged the explanation, agreed to proceed with the plan and consent was signed. Patient is being admitted for inpatient treatment for surgery, pain control, PT, OT, prophylactic antibiotics, VTE prophylaxis, progressive ambulation and ADL's and discharge planning.The patient is planning to be discharged home with home health services

## 2012-09-04 NOTE — Transfer of Care (Signed)
Immediate Anesthesia Transfer of Care Note  Patient: Shannon Berger  Procedure(s) Performed: Procedure(s) with comments: LEFT TOTAL HIP ARTHROPLASTY ANTERIOR APPROACH (Left) - Left Total Hip Arthroplasty, Anterior Approach  Patient Location: PACU  Anesthesia Type:MAC and Spinal  Level of Consciousness: awake, alert , oriented and patient cooperative  Airway & Oxygen Therapy: Patient Spontanous Breathing and Patient connected to face mask oxygen  Post-op Assessment: Report given to PACU RN and Post -op Vital signs reviewed and stable  Post vital signs: Reviewed and stable  Complications: No apparent anesthesia complications

## 2012-09-04 NOTE — Progress Notes (Signed)
Utilization review completed.  

## 2012-09-04 NOTE — Anesthesia Postprocedure Evaluation (Signed)
  Anesthesia Post-op Note  Patient: Shannon Berger  Procedure(s) Performed: Procedure(s) (LRB): LEFT TOTAL HIP ARTHROPLASTY ANTERIOR APPROACH (Left)  Patient Location: PACU  Anesthesia Type: Spinal  Level of Consciousness: awake and alert   Airway and Oxygen Therapy: Patient Spontanous Breathing  Post-op Pain: mild  Post-op Assessment: Post-op Vital signs reviewed, Patient's Cardiovascular Status Stable, Respiratory Function Stable, Patent Airway and No signs of Nausea or vomiting  Last Vitals:  Filed Vitals:   09/04/12 1030  BP: 130/54  Pulse: 47  Temp:   Resp: 12    Post-op Vital Signs: stable   Complications: No apparent anesthesia complications

## 2012-09-04 NOTE — Evaluation (Signed)
Physical Therapy Evaluation Patient Details Name: Shannon Berger MRN: [id number] DOB: 11-Jul-1940 Today's Date: 09/04/2012 Time: 4098-1191 PT Time Calculation (min): 18 min  PT Assessment / Plan / Recommendation Clinical Impression  72 yo female s/p L THA direct anterior. Mobilizing very well. On eval pt required Min assist for mobility. Anticipate pt wil progress well during stay. Recommend HHPT. Pt's husband will bring in RW from home to ensure the height will be appropriate for pt.    PT Assessment  Patient needs continued PT services    Follow Up Recommendations  Home health PT    Does the patient have the potential to tolerate intense rehabilitation      Barriers to Discharge        Equipment Recommendations  Other (comment) (to be determined)    Recommendations for Other Services OT consult   Frequency 7X/week    Precautions / Restrictions Precautions Precautions: Fall Restrictions Weight Bearing Restrictions: No LLE Weight Bearing: Weight bearing as tolerated   Pertinent Vitals/Pain 5/10 L hip      Mobility  Bed Mobility Bed Mobility: Supine to Sit Supine to Sit: 4: Min assist Details for Bed Mobility Assistance: Assist for L LE off bed.  Transfers Transfers: Sit to Stand;Stand to Sit Sit to Stand: 4: Min assist;From bed;With upper extremity assist Stand to Sit: 4: Min assist;To chair/3-in-1;With armrests;With upper extremity assist Details for Transfer Assistance: VCs safety, technique, hand placement. Assist to rise, stabilize, control descent.  Ambulation/Gait Ambulation/Gait Assistance: 4: Min assist Ambulation Distance (Feet): 60 Feet Assistive device: Rolling walker Ambulation/Gait Assistance Details: Assist to stabilize. VCs safety, sequence.  Gait Pattern: Step-to pattern;Step-through pattern    Exercises     PT Diagnosis: Difficulty walking;Abnormality of gait;Acute pain  PT Problem List: Decreased strength;Decreased range of  motion;Decreased mobility;Decreased knowledge of use of DME;Decreased activity tolerance PT Treatment Interventions: DME instruction;Gait training;Stair training;Functional mobility training;Therapeutic activities;Therapeutic exercise;Wheelchair mobility training   PT Goals Acute Rehab PT Goals PT Goal Formulation: With patient/family Time For Goal Achievement: 09/11/12 Potential to Achieve Goals: Good Pt will go Supine/Side to Sit: with supervision PT Goal: Supine/Side to Sit - Progress: Goal set today Pt will go Sit to Supine/Side: with supervision PT Goal: Sit to Supine/Side - Progress: Goal set today Pt will go Sit to Stand: with supervision PT Goal: Sit to Stand - Progress: Goal set today Pt will Ambulate: 51 - 150 feet;with supervision;with rolling walker PT Goal: Ambulate - Progress: Goal set today Pt will Go Up / Down Stairs: 3-5 stairs;with rail(s);with least restrictive assistive device;with min assist PT Goal: Up/Down Stairs - Progress: Goal set today Pt will Perform Home Exercise Program: with supervision, verbal cues required/provided PT Goal: Perform Home Exercise Program - Progress: Goal set today  Visit Information  Last PT Received On: 09/04/12 Assistance Needed: +1    Subjective Data  Subjective: I don't know if I'm ready for this Patient Stated Goal: home   Prior Functioning  Home Living Lives With: Spouse Available Help at Discharge: Family Type of Home: House Home Layout: Two level;Able to live on main level with bedroom/bathroom Bathroom Shower/Tub: Walk-in shower Home Adaptive Equipment: Walker - rolling;Shower chair without back Prior Function Level of Independence: Independent Able to Take Stairs?: Yes Driving: Yes Communication Communication: HOH    Cognition  Cognition Overall Cognitive Status: Appears within functional limits for tasks assessed/performed Arousal/Alertness: Awake/Shannon Orientation Level: Appears intact for tasks  assessed Behavior During Session: [facility] for tasks performed    Extremity/Trunk Assessment Right  Lower Extremity Assessment RLE ROM/Strength/Tone: [facility] for tasks assessed Left Lower Extremity Assessment LLE ROM/Strength/Tone: Deficits LLE ROM/Strength/Tone Deficits: hip abd/add 2/5, hip flex 2/5, moves ankle well LLE Sensation: WFL - Light Touch Trunk Assessment Trunk Assessment: Normal   Balance    End of Session PT - End of Session Equipment Utilized During Treatment: Gait belt Activity Tolerance: Patient tolerated treatment well Patient left: in chair;with call bell/phone within reach;with family/visitor present  GP     Shannon Berger Shannon Berger 09/04/2012, 3:45 PM [phone]

## 2012-09-04 NOTE — Anesthesia Procedure Notes (Signed)
Spinal  Patient location during procedure: OR Staffing Performed by: anesthesiologist  Preanesthetic Checklist Completed: patient identified, site marked, surgical consent, pre-op evaluation, timeout performed, IV checked, risks and benefits discussed and monitors and equipment checked Spinal Block Patient position: sitting Prep: Betadine Patient monitoring: heart rate, continuous pulse ox, blood pressure and cardiac monitor Approach: left paramedian Location: L3-4 Injection technique: single-shot Needle Needle type: Spinocan  Needle gauge: 22 G Needle length: 9 cm Additional Notes Expiration date of kit checked and confirmed. Patient tolerated procedure well, without complications. No paresthesia.

## 2012-09-04 NOTE — Preoperative (Signed)
Beta Blockers   Reason not to administer Beta Blockers:Ziac taken at 0430 on 09-04-12

## 2012-09-04 NOTE — H&P (Deleted)
TOTAL KNEE ADMISSION H&P  Patient is being admitted for left total knee arthroplasty.  Subjective:  Chief Complaint:left knee pain.  HPI: Shannon Berger, 72 y.o. female, has a history of pain and functional disability in the left knee due to arthritis and has failed non-surgical conservative treatments for greater than 12 weeks to includeNSAID's and/or analgesics, corticosteriod injections, flexibility and strengthening excercises, activity modification and arthroscopy.  Onset of symptoms was gradual, starting 2 years ago with rapidlly worsening course since that time. The patient noted prior procedures on the knee to include  arthroscopy on the left knee(s).  Patient currently rates pain in the left knee(s) at 6 out of 10 with activity. Patient has night pain, worsening of pain with activity and weight bearing, pain that interferes with activities of daily living, pain with passive range of motion and joint swelling.  Patient has evidence of periarticular osteophytes and joint space narrowing by imaging studies. There is no active infection.  Patient Active Problem List   Diagnosis Date Noted  . Degenerative arthritis of hip 09/04/2012  . Pigmented villonodular synovitis of left knee 09/04/2012  . Occlusion and stenosis of carotid artery without mention of cerebral infarction 12/25/2011  . Chest pain 04/15/2011  . PVC (premature ventricular contraction) 04/15/2011  . Hypertension   . Carotid arterial disease   . Hyperlipidemia   . Pilonidal cyst 02/05/2011   Past Medical History  Diagnosis Date  . Arthritis   . Hypertension   . Sleep difficulties   . Carotid arterial disease   . Osteopenia   . Hyperlipidemia   . Osteoarthritis   . Constipation     Past Surgical History  Procedure Laterality Date  . Bunionectomy    . Tonsillectomy and adenoidectomy    . Tendon repair      left hip  . Foot surgery      Status post right foot surgery  . Other surgical history      post  fusion of her right thumb  . Pilonidal cyst / sinus excision    . Hand fusion      Right thumb  . Tendon repair      Reattachment of tendon- Left hip    Prescriptions prior to admission  Medication Sig Dispense Refill  . ALPRAZolam (XANAX) 0.5 MG tablet Take 0.25 mg by mouth at bedtime as needed for sleep.       . Ascorbic Acid (VITAMIN C PO) Take 700 mg by mouth daily.      Marland Kitchen atorvastatin (LIPITOR) 20 MG tablet Take 20 mg by mouth at bedtime.       . bisoprolol-hydrochlorothiazide (ZIAC) 2.5-6.25 MG per tablet Take 1 tablet by mouth every morning.       . Cholecalciferol (VITAMIN D3) 2000 UNITS TABS Take 2,000 mg by mouth daily.       . DULoxetine (CYMBALTA) 60 MG capsule Take 60 mg by mouth at bedtime.       . raloxifene (EVISTA) 60 MG tablet Take 60 mg by mouth every morning.      . Turmeric 450 MG CAPS Take 9,000 mg by mouth every other day.      . Acetylcarnitine HCl (ACETYL L-CARNITINE) 500 MG CAPS Take 500 mg by mouth daily.       Marland Kitchen aspirin 81 MG tablet Take 81 mg by mouth at bedtime.       . Coenzyme Q10 (CO Q10) 100 MG TABS Take 200 mg by mouth every morning.      Marland Kitchen  MAGNESIUM PO Take 400 mg by mouth daily.       . meloxicam (MOBIC) 15 MG tablet Take 15 mg by mouth daily as needed for pain.       . Multiple Vitamin (MULTIVITAMIN) tablet Take 1 tablet by mouth daily.        . Omega-3 Fatty Acids (FISH OIL PO) Take 2,400 mg by mouth 2 (two) times daily.        No Known Allergies  History  Substance Use Topics  . Smoking status: Never Smoker   . Smokeless tobacco: Never Used  . Alcohol Use: Yes     Comment: 2 gl wine per week    Family History  Problem Relation Age of Onset  . Heart failure Mother   . Cancer Mother   . Hypertension Mother   . Other Mother     Varicose Veins  . Diabetes Father   . Heart disease Father   . Hypertension Father   . Heart attack Father      Review of Systems  Musculoskeletal: Positive for joint pain.  All other systems reviewed and are  negative.    Objective:  Physical Exam  Constitutional: She is oriented to person, place, and time. She appears well-developed and well-nourished.  HENT:  Head: Normocephalic and atraumatic.  Eyes: EOM are normal. Pupils are equal, round, and reactive to light.  Neck: Normal range of motion. Neck supple.  Cardiovascular: Normal rate and regular rhythm.   Respiratory: Effort normal and breath sounds normal.  GI: Soft. Bowel sounds are normal.  Musculoskeletal:       Left knee: She exhibits decreased range of motion, swelling and effusion. Tenderness found. Medial joint line and lateral joint line tenderness noted.  Neurological: She is alert and oriented to person, place, and time.  Skin: Skin is warm and dry.  Psychiatric: She has a normal mood and affect.    Vital signs in last 24 hours: Temp:  [97.5 F (36.4 C)] 97.5 F (36.4 C) (02/28 0522) Pulse Rate:  [64] 64 (02/28 0522) Resp:  [18] 18 (02/28 0522) BP: (169)/(68) 169/68 mmHg (02/28 0522) SpO2:  [100 %] 100 % (02/28 0522)  Labs:   Estimated body mass index is 20.66 kg/(m^2) as calculated from the following:   Height as of 08/21/12: 5\' 2"  (1.575 m).   Weight as of 07/23/12: 51.256 kg (113 lb).   Imaging Review Plain radiographs demonstrate moderate degenerative joint disease of the left knee(s). The overall alignment isneutral. The bone quality appears to be good for age and reported activity level.  Assessment/Plan:  End stage arthritis, left knee   The patient history, physical examination, clinical judgment of the provider and imaging studies are consistent with end stage degenerative joint disease of the left knee(s) and total knee arthroplasty is deemed medically necessary. The treatment options including medical management, injection therapy arthroscopy and arthroplasty were discussed at length. The risks and benefits of total knee arthroplasty were presented and reviewed. The risks due to aseptic loosening,  infection, stiffness, patella tracking problems, thromboembolic complications and other imponderables were discussed. The patient acknowledged the explanation, agreed to proceed with the plan and consent was signed. Patient is being admitted for inpatient treatment for surgery, pain control, PT, OT, prophylactic antibiotics, VTE prophylaxis, progressive ambulation and ADL's and discharge planning. The patient is planning to be discharged home with home health services

## 2012-09-04 NOTE — Brief Op Note (Signed)
09/04/2012  9:41 AM  PATIENT:  Shannon Berger  72 y.o. female  PRE-OPERATIVE DIAGNOSIS:  Left hip osteoarthritis  POST-OPERATIVE DIAGNOSIS:  Left hip osteoarthritis  PROCEDURE:  Procedure(s) with comments: LEFT TOTAL HIP ARTHROPLASTY ANTERIOR APPROACH (Left) - Left Total Hip Arthroplasty, Anterior Approach  SURGEON:  Surgeon(s) and Role:    * Kathryne Hitch, MD - Primary  PHYSICIAN ASSISTANT:   ASSISTANTS: none   ANESTHESIA:   spinal  EBL:  Total I/O In: 1700 [I.V.:1700] Out: 255 [Urine:80; Blood:175]  BLOOD ADMINISTERED:none  DRAINS: none   LOCAL MEDICATIONS USED:  NONE  SPECIMEN:  No Specimen  DISPOSITION OF SPECIMEN:  N/A  COUNTS:  YES  TOURNIQUET:  * No tourniquets in log *  DICTATION: .Other Dictation: Dictation Number [id number]  PLAN OF CARE: Admit to inpatient   PATIENT DISPOSITION:  PACU - hemodynamically stable.   Delay start of Pharmacological VTE agent (>24hrs) due to surgical blood loss or risk of bleeding: no

## 2012-09-04 NOTE — Anesthesia Preprocedure Evaluation (Addendum)
Anesthesia Evaluation  Patient identified by MRN, date of birth, ID band Patient awake    Reviewed: Allergy & Precautions, H&P , NPO status , Patient's Chart, lab work & pertinent test results  Airway Mallampati: II TM Distance: >3 FB Neck ROM: Full    Dental no notable dental hx.    Pulmonary neg pulmonary ROS,  breath sounds clear to auscultation  Pulmonary exam normal       Cardiovascular hypertension, Pt. on home beta blockers and Pt. on medications + Peripheral Vascular Disease + dysrhythmias Rhythm:Regular Rate:Normal     Neuro/Psych negative neurological ROS  negative psych ROS   GI/Hepatic negative GI ROS, Neg liver ROS,   Endo/Other  negative endocrine ROS  Renal/GU negative Renal ROS  negative genitourinary   Musculoskeletal negative musculoskeletal ROS (+)   Abdominal   Peds negative pediatric ROS (+)  Hematology negative hematology ROS (+)   Anesthesia Other Findings   Reproductive/Obstetrics negative OB ROS                           Anesthesia Physical Anesthesia Plan  ASA: III  Anesthesia Plan: Spinal   Post-op Pain Management:    Induction: Intravenous  Airway Management Planned:   Additional Equipment:   Intra-op Plan:   Post-operative Plan:   Informed Consent: I have reviewed the patients History and Physical, chart, labs and discussed the procedure including the risks, benefits and alternatives for the proposed anesthesia with the patient or authorized representative who has indicated his/her understanding and acceptance.   Dental advisory given  Plan Discussed with: CRNA  Anesthesia Plan Comments: (Discussed risks/benefits of spinal including headache, backache, failure, bleeding, infection, and nerve damage. Patient consents to spinal. Questions answered. Coagulation studies and platelet count acceptable.)       Anesthesia Quick Evaluation

## 2012-09-05 LAB — CBC
HCT: 26.4 % — ABNORMAL LOW (ref 36.0–46.0)
Hemoglobin: 8.6 g/dL — ABNORMAL LOW (ref 12.0–15.0)
MCH: 31.5 pg (ref 26.0–34.0)
MCHC: 32.6 g/dL (ref 30.0–36.0)
MCV: 96.7 fL (ref 78.0–100.0)
Platelets: 244 10*3/uL (ref 150–400)
RBC: 2.73 MIL/uL — ABNORMAL LOW (ref 3.87–5.11)
RDW: 13.1 % (ref 11.5–15.5)
WBC: 5.4 10*3/uL (ref 4.0–10.5)

## 2012-09-05 LAB — BASIC METABOLIC PANEL
BUN: 8 mg/dL (ref 6–23)
CO2: 26 mEq/L (ref 19–32)
Calcium: 8.1 mg/dL — ABNORMAL LOW (ref 8.4–10.5)
Chloride: 109 mEq/L (ref 96–112)
Creatinine, Ser: 0.59 mg/dL (ref 0.50–1.10)
GFR calc Af Amer: >90 mL/min (ref >90)
GFR calc non Af Amer: >90 mL/min (ref >90)
Glucose, Bld: 101 mg/dL — ABNORMAL HIGH (ref 70–99)
Potassium: 3.8 mEq/L (ref 3.5–5.1)
Sodium: 141 mEq/L (ref 135–145)

## 2012-09-05 MED ORDER — POLYETHYLENE GLYCOL 3350 17 G PO PACK
17.0000 g | PACK | Freq: Two times a day (BID) | ORAL | Status: DC
  Administered 2012-09-05 – 2012-09-06 (×3): 17 g via ORAL

## 2012-09-05 NOTE — Progress Notes (Signed)
Subjective: Pt doing well - c/o calf cramping   Objective: Vital signs in last 24 hours: Temp:  [94.5 F (34.7 C)-98.3 F (36.8 C)] 98.2 F (36.8 C) (03/01 0800) Pulse Rate:  [47-77] 73 (03/01 0800) Resp:  [12-19] 16 (03/01 0800) BP: (107-165)/(50-75) 117/64 mmHg (03/01 0800) SpO2:  [97 %-100 %] 97 % (03/01 0800) Weight:  [51.256 kg (113 lb)] 51.256 kg (113 lb) (02/28 1200)  Intake/Output from previous day: 02/28 0701 - 03/01 0700 In: 3907.5 [P.O.:780; I.V.:3127.5] Out: 3530 [Urine:3355; Blood:175] Intake/Output this shift: Total I/O In: -  Out: 150 [Urine:150]  Exam:  Neurovascular intact Sensation intact distally Intact pulses distally Dorsiflexion/Plantar flexion intact calf non tender and no swelling  Labs:  Recent Labs  09/05/12 0517  HGB 8.6*    Recent Labs  09/05/12 0517  WBC 5.4  RBC 2.73*  HCT 26.4*  PLT 244    Recent Labs  09/05/12 0517  NA 141  K 3.8  CL 109  CO2 26  BUN 8  CREATININE 0.59  GLUCOSE 101*  CALCIUM 8.1*   No results found for this basename: LABPT, INR,  in the last 72 hours  Assessment/Plan: Pt stable - not oob yet - dc foley at 1700 - hl iv   DEAN,GREGORY SCOTT 09/05/2012, 8:47 AM

## 2012-09-05 NOTE — Progress Notes (Signed)
Physical Therapy Treatment Patient Details Name: Shannon Berger MRN: [id number] DOB: 02/08/1941 Today's Date: 09/05/2012 Time: 4098-1191 PT Time Calculation (min): 48 min  PT Assessment / Plan / Recommendation Comments on Treatment Session  Pt continues to progress very well and practiced stairs with pt/husband.  Noted when pt was ambulating back to room, she stated she felt nauseated and needed to return to chair.  RN notified.     Follow Up Recommendations  Home health PT     Does the patient have the potential to tolerate intense rehabilitation     Barriers to Discharge        Equipment Recommendations  None recommended by PT    Recommendations for Other Services    Frequency 7X/week   Plan Discharge plan remains appropriate    Precautions / Restrictions Precautions Precautions: Fall Restrictions Weight Bearing Restrictions: No LLE Weight Bearing: Weight bearing as tolerated   Pertinent Vitals/Pain 3/10 pain    Mobility  Bed Mobility Bed Mobility: Supine to Sit;Sit to Supine Supine to Sit: 4: Min assist;HOB flat Sit to Supine: 4: Min assist;HOB flat Details for Bed Mobility Assistance: Assist for LLE into and out of bed with min cues for adjusting hips once in bed.  Transfers Transfers: Sit to Stand;Stand to Sit Sit to Stand: 5: Supervision;With upper extremity assist;From bed;From chair/3-in-1 Stand to Sit: 5: Supervision;With upper extremity assist;With armrests;To chair/3-in-1;To bed Details for Transfer Assistance: Performed x 3 for rest break in hallway due to nausea and to use 3in1 in restroom.  Min cues for safety and hand placement.  Ambulation/Gait Ambulation/Gait Assistance: 5: Supervision Ambulation Distance (Feet): 200 Feet Assistive device: Rolling walker Ambulation/Gait Assistance Details: Min cues for not pushing RW too far ahead of her and relaxed posture.  Gait Pattern: Step-to pattern;Step-through pattern Gait velocity: decreased Stairs:  Yes Stairs Assistance: 4: Min guard Stair Management Technique: No rails;Step to pattern;Backwards;Forwards;With walker Number of Stairs: 2    Exercises     PT Diagnosis:    PT Problem List:   PT Treatment Interventions:     PT Goals Acute Rehab PT Goals PT Goal Formulation: With patient/family Time For Goal Achievement: 09/11/12 Potential to Achieve Goals: Good Pt will go Supine/Side to Sit: with supervision PT Goal: Supine/Side to Sit - Progress: Progressing toward goal Pt will go Sit to Supine/Side: with supervision PT Goal: Sit to Supine/Side - Progress: Progressing toward goal Pt will go Sit to Stand: with supervision PT Goal: Sit to Stand - Progress: Met Pt will Ambulate: 51 - 150 feet;with supervision;with rolling walker PT Goal: Ambulate - Progress: Met Pt will Go Up / Down Stairs: 3-5 stairs;with rail(s);with least restrictive assistive device;with min assist PT Goal: Up/Down Stairs - Progress: Progressing toward goal Pt will Perform Home Exercise Program: with supervision, verbal cues required/provided PT Goal: Perform Home Exercise Program - Progress: Progressing toward goal  Visit Information  Last PT Received On: 09/05/12 Assistance Needed: +1    Subjective Data  Subjective: I'm getting hot (while ambulating) Patient Stated Goal: home   Cognition  Cognition Overall Cognitive Status: Appears within functional limits for tasks assessed/performed Arousal/Alertness: Awake/alert Orientation Level: Appears intact for tasks assessed Behavior During Session: [facility] for tasks performed    Balance     End of Session PT - End of Session Activity Tolerance:  (Limited by nausea) Patient left: in bed;with call bell/phone within reach;with family/visitor present Nurse Communication: Mobility status;Other (comment) (need for nausea meds)   GP     Emmakate Hypes,  Meribeth Mattes 09/05/2012, 6:26 PM

## 2012-09-05 NOTE — Op Note (Signed)
Shannon Berger, Shannon Berger NO.:  [id number]  MEDICAL RECORD NO.:  [id number]  LOCATION:  1608                         FACILITY:  [facility] - Inpatient  PHYSICIAN:  Vanita Panda. Magnus Ivan, M.D.DATE OF BIRTH:  1940/09/20  DATE OF PROCEDURE:  09/04/2012 DATE OF DISCHARGE:                              OPERATIVE REPORT   PREOPERATIVE DIAGNOSIS:  Severe end-stage arthritis and degenerative joint disease of left hip.  POSTOPERATIVE DIAGNOSIS:  Severe end-stage arthritis and degenerative joint disease of left hip.  PROCEDURE:  Left total hip arthroplasty, direct anterior approach.  IMPLANTS:  DePuy Sector Gription acetabular component size 50, size 32+ 4 neutral polyethylene liner, size 8 Corail femoral component with standard offset (KA), size 32+ 1 ceramic hip ball.  SURGEON:  Vanita Panda. Magnus Ivan, MD  ANESTHESIA:  Spinal.  ANTIBIOTICS:  2 g IV Ancef.  BLOOD LOSS:  Less than 200 mL.  COMPLICATIONS:  None.  INDICATIONS:  Ms. Eshleman is a 72 year old female with well documented end-stage arthritis involving her left hip.  Her x-rays showed joint space narrowing, periarticular osteophytes, subchondral sclerosis, and subchondral cysts.  She has failed conservative treatment with nonsteroidal anti-inflammatory.  She has had injections in her hip socket as well, rest and time.  She is already a thin individual and cannot lose anymore weight.  She has a profound impact on her activities of daily living.  She has night pain, pain with ambulating and is at the point where she wished to proceed with a total hip arthroplasty.  We spent a good amount of time in the office showing her hip model, explaining in detail what the surgery involves and she does wish to proceed.  She understands the risks of acute blood loss anemia, infection, fracture, and DVT.  PROCEDURE DESCRIPTION:  After informed consent was obtained, appropriate left hip was marked.  She was brought to the operating  room.  Spinal anesthesia was obtained while she was on a stretcher.  Foley catheter was placed and both feet had traction boots placed on them.  She was then placed supine on the Hana fracture table.  A perineal post was in place and both legs were placed in traction devices but no traction applied.  We then assessed her hips under direct fluoroscopy, so we could obtain the center of the pelvis for assessing leg lengths and then assessing the left hip for implant placement.  We then backed all the C- arm and prepped the left hip with DuraPrep and sterile drapes.  We let the DuraPrep to dry for 3 minutes before placing the drapes.  Sterile drapes were applied around the C-arm as well.  Time-out was called and she was identified as correct patient, correct left hip.  We then made an incision just inferior and posterior to the anterior superior iliac spine and dissected down to the tensor fascia muscle.  The tensor fascia was then divided longitudinally and proceeded with a direct anterior approach to the hip.  A Cobra retractor was placed around the lateral neck and up underneath the rectus femoris, the medial retractor was placed.  I cauterized the lateral femoral circumflex vessels and then opened the hip capsule and found a large joint effusion.  I placed the  Cobra retractors within the hip capsule and then made my femoral neck cut just proximal to the lesser trochanter with an oscillating saw and completed this with an osteotome.  I used a corkscrew guide in the femoral head and removed the femoral head in its entirety and found a devoid of cartilage and quite hard with significant sclerosis.  I then cleaned the acetabulum debris and soft tissue.  I placed a bent Hohmann medially and a Cobra retractor laterally.  I began reaming under direct visualization from size 42 reamer in 2 mm increments up to size 50.  All reamers were placed under direct visualization and the last reamer  also placed under direct fluoroscopy, so we could obtain our depth of reaming, our inclination, and anteversion.  Once I was pleased with this, I placed the real Sector Gription acetabular component and also assessed this under direct fluoroscopy.  We placed the apex hole eliminator guide and then the real 32+ 4 neutral polyethylene liner. Attention was then turned to the femur with the leg externally rotated to 90 degrees, extended and adducted.  We used a Mueller retractor medially and a Hohmann retractor behind the greater trochanter.  I released the lateral capsule and the piriformis and then used a box cutting guide and a rongeur to open up the femoral canal.  I just placed a size 8 broach and the size 8 broach was actually stable.  I brought the fluoroscopic unit again to make sure that we were in good condition with VS.  I then placed a standard neck trial and a 32+ 1 hip ball.  We brought the leg back over and up and with traction of internal rotation, reduced the hip.  It was stable with internal and external rotation with minimal shuck.  We measured the leg lengths under direct fluoroscopy and I was pleased with the position of the implants and leg lengths were measured equal.  We then dislocated the hip with the trial components and removed the trial components and that was after bringing the leg down and over.  We then placed the real Corail femoral component with HA coating, the real 32+ 1 ceramic hip ball.  We then brought the leg back up and reduced the hip again and I was pleased with the rotation stability.  We then copiously irrigated the hip with normal saline solution of __________ L using pulsatile lavage.  I closed the joint capsule with interrupted #1 Ethibond suture followed by running 0 V-Loc in the tensor fascia, 2-0 Vicryl in subcutaneous tissue, 4-0 Monocryl subcuticular stitch, and Dermabond on the skin.  An Aquacel dressing was then applied.  She was taken  off the [facility] table.  Her leg lengths again were felt to be equal and she was on her stretcher.  She was taken to recovery room in stable condition.  All final counts were correct. There were no complications noted.     Vanita Panda. Magnus Ivan, M.D.     CYB/MEDQ  D:  09/04/2012  T:  09/05/2012  Job:  [id number]

## 2012-09-05 NOTE — Progress Notes (Signed)
Pt states she does not want "foley pulled this early as she does not want to get up this early"

## 2012-09-05 NOTE — Evaluation (Signed)
Occupational Therapy Evaluation Patient Details Name: Shannon Berger MRN: [id number] DOB: 1940-10-27 Today's Date: 09/05/2012 Time: 7829-5621 OT Time Calculation (min): 40 min  OT Assessment / Plan / Recommendation Clinical Impression  Pt is recovering from L  direct anterior hip replacement.  She is moving well post op day 1.  Needs to practice shower transfer.  Needs a 3 in1 for home.    OT Assessment  Patient needs continued OT Services    Follow Up Recommendations  No OT follow up;Supervision/Assistance - 24 hour    Barriers to Discharge      Equipment Recommendations  3 in 1 bedside comode    Recommendations for Other Services    Frequency  Min 2X/week    Precautions / Restrictions Precautions Precautions: Fall Restrictions Weight Bearing Restrictions: No LLE Weight Bearing: Weight bearing as tolerated   Pertinent Vitals/Pain 1/10 L hip, premedicated, iced    ADL  Eating/Feeding: Independent Where Assessed - Eating/Feeding: Chair Grooming: Wash/dry hands;Supervision/safety Where Assessed - Grooming: Unsupported standing Upper Body Bathing: Set up Where Assessed - Upper Body Bathing: Unsupported sitting Lower Body Bathing: Minimal assistance Where Assessed - Lower Body Bathing: Unsupported sitting;Supported sit to stand Upper Body Dressing: Set up Where Assessed - Upper Body Dressing: Unsupported sitting Lower Body Dressing: Minimal assistance Where Assessed - Lower Body Dressing: Unsupported sitting;Supported sit to stand Toilet Transfer: Min Pension scheme manager Method: Sit to Barista: Regular height toilet (will need 3 in 1) Equipment Used: Gait belt;Rolling walker;Long-handled shoe horn;Long-handled sponge;Reacher;Sock aid Transfers/Ambulation Related to ADLs: ambulating with min guard assist with RW, verbal cues for sequencing ADL Comments: Pt very close to being able to reach her L foot to donn/doff sock,  Educated in use of  AE, has a Sports administrator.  Will rely on family until she can perform independently. Educated family in multiple uses of 3 in 1 as pt as urinary urgency.    OT Diagnosis: Generalized weakness;Acute pain  OT Problem List: Decreased strength;Impaired balance (sitting and/or standing);Pain OT Treatment Interventions: Self-care/ADL training;DME and/or AE instruction;Patient/family education   OT Goals Acute Rehab OT Goals OT Goal Formulation: With patient Time For Goal Achievement: 09/12/12 Potential to Achieve Goals: Good ADL Goals Pt Will Perform Tub/Shower Transfer: Shower transfer;with supervision;Ambulation;with DME;Shower seat without back ADL Goal: Web designer - Progress: Goal set today  Visit Information  Last OT Received On: 09/05/12 Assistance Needed: +1    Subjective Data  Subjective: "I'm stiff." Patient Stated Goal: Home with assist of family.   Prior Functioning     Home Living Lives With: Spouse Available Help at Discharge: Family Type of Home: House Home Layout: Two level;Able to live on main level with bedroom/bathroom Bathroom Shower/Tub: Health visitor: Standard Home Adaptive Equipment: Walker - rolling;Shower chair without back;Reacher Additional Comments: Showed pt AE for LB ADL, will purchase a long handled sponge on her own Prior Function Level of Independence: Independent Able to Take Stairs?: Yes Driving: Yes Communication Communication: HOH Dominant Hand: Right         Vision/Perception Vision - History Baseline Vision: Wears glasses only for reading   Cognition  Cognition Overall Cognitive Status: Appears within functional limits for tasks assessed/performed Arousal/Alertness: Awake/alert Orientation Level: Appears intact for tasks assessed Behavior During Session: [facility] for tasks performed    Extremity/Trunk Assessment Right Upper Extremity Assessment RUE ROM/Strength/Tone: [facility] for tasks assessed Left Upper Extremity  Assessment LUE ROM/Strength/Tone: [facility] for tasks assessed Trunk Assessment Trunk Assessment: Normal  Mobility Bed Mobility Bed Mobility: Supine to Sit;Sitting - Scoot to Edge of Bed Supine to Sit: 4: Min assist;HOB flat (for L LE) Sitting - Scoot to Edge of Bed: 5: Supervision Transfers Transfers: Sit to Stand;Stand to Sit Sit to Stand: 4: Min guard;With upper extremity assist;From bed;From toilet Stand to Sit: 4: Min guard;With upper extremity assist;To chair/3-in-1 Details for Transfer Assistance: VCs safety, technique, hand placement. Assist to rise, stabilize, control descent.      Exercise     Balance     End of Session OT - End of Session Activity Tolerance: Patient tolerated treatment well Patient left: in chair;with call bell/phone within reach;with family/visitor present Nurse Communication: Mobility status (may leave pulse ox off)  GO     Evern Bio 09/05/2012, 10:16 AM [phone]

## 2012-09-05 NOTE — Progress Notes (Signed)
Physical Therapy Treatment Patient Details Name: Shannon Berger MRN: [id number] DOB: 1941/05/24 Today's Date: 09/05/2012 Time: 1030-1110 PT Time Calculation (min): 40 min  PT Assessment / Plan / Recommendation Comments on Treatment Session  Pt progressing very well with all mobility and is supervision level for amb.     Follow Up Recommendations  Home health PT     Does the patient have the potential to tolerate intense rehabilitation     Barriers to Discharge        Equipment Recommendations  None recommended by PT    Recommendations for Other Services    Frequency 7X/week   Plan Discharge plan remains appropriate    Precautions / Restrictions Precautions Precautions: Fall Restrictions Weight Bearing Restrictions: No LLE Weight Bearing: Weight bearing as tolerated   Pertinent Vitals/Pain 4/10 pain, ice pack applied, RN aware and pain medicine given.     Mobility  Bed Mobility Bed Mobility: Sit to Supine Supine to Sit: 4: Min assist;HOB flat (for L LE) Sitting - Scoot to Edge of Bed: 5: Supervision Sit to Supine: 4: Min assist;HOB flat Details for Bed Mobility Assistance: Assist for LLE into bed with cues for adjusting hips once in bed.  Transfers Transfers: Sit to Stand;Stand to Sit Sit to Stand: 4: Min guard;With upper extremity assist;With armrests;From chair/3-in-1 Stand to Sit: 5: Supervision;With upper extremity assist;To bed Details for Transfer Assistance: Min cues for hand placement.  Ambulation/Gait Ambulation/Gait Assistance: 5: Supervision;4: Min guard Ambulation Distance (Feet): 180 Feet Assistive device: Rolling walker Ambulation/Gait Assistance Details: Min cues for sequencing/technique with RW, upright posture and increasing WB through LLE each time she is up.  Gait Pattern: Step-to pattern;Step-through pattern Gait velocity: decreased    Exercises Total Joint Exercises Ankle Circles/Pumps: AROM;Both;20 reps Quad Sets: AROM;Both;10  reps Short Arc Quad: AROM;Left;10 reps Heel Slides: AAROM;Left;10 reps Hip ABduction/ADduction: AAROM;Left;10 reps   PT Diagnosis:    PT Problem List:   PT Treatment Interventions:     PT Goals Acute Rehab PT Goals PT Goal Formulation: With patient/family Time For Goal Achievement: 09/11/12 Potential to Achieve Goals: Good Pt will go Sit to Supine/Side: with supervision PT Goal: Sit to Supine/Side - Progress: Progressing toward goal Pt will go Sit to Stand: with supervision PT Goal: Sit to Stand - Progress: Progressing toward goal Pt will Ambulate: 51 - 150 feet;with supervision;with rolling walker PT Goal: Ambulate - Progress: Partly met Pt will Perform Home Exercise Program: with supervision, verbal cues required/provided PT Goal: Perform Home Exercise Program - Progress: Progressing toward goal  Visit Information  Last PT Received On: 09/05/12 Assistance Needed: +1    Subjective Data  Subjective: I want something for pain, but I don't want to be knocked out.  Patient Stated Goal: home   Cognition  Cognition Overall Cognitive Status: Appears within functional limits for tasks assessed/performed Arousal/Alertness: Awake/alert Orientation Level: Appears intact for tasks assessed Behavior During Session: [facility] for tasks performed    Balance     End of Session PT - End of Session Activity Tolerance: Patient tolerated treatment well Patient left: in bed;with call bell/phone within reach;with family/visitor present Nurse Communication: Mobility status   GP     Vista Deck 09/05/2012, 11:25 AM

## 2012-09-06 LAB — CBC
HCT: 26.3 % — ABNORMAL LOW (ref 36.0–46.0)
Hemoglobin: 9.4 g/dL — ABNORMAL LOW (ref 12.0–15.0)
MCH: 34.6 pg — ABNORMAL HIGH (ref 26.0–34.0)
MCHC: 35.7 g/dL (ref 30.0–36.0)
MCV: 96.7 fL (ref 78.0–100.0)
Platelets: 208 10*3/uL (ref 150–400)
RBC: 2.72 MIL/uL — ABNORMAL LOW (ref 3.87–5.11)
RDW: 13 % (ref 11.5–15.5)
WBC: 5.5 10*3/uL (ref 4.0–10.5)

## 2012-09-06 NOTE — Progress Notes (Signed)
Pt's BP=91/49, HR=72. Pharmacy recommends holding beta blocker. Notified Dr August Saucer via phone, instructed by him to hold med. [employer]

## 2012-09-06 NOTE — Progress Notes (Signed)
Subjective: Pt stable - was up with PT yesterday - no urinary issues   Objective: Vital signs in last 24 hours: Temp:  [97.4 F (36.3 C)-98.6 F (37 C)] 97.4 F (36.3 C) (03/02 0845) Pulse Rate:  [61-68] 66 (03/02 0845) Resp:  [16-18] 16 (03/02 0845) BP: (91-98)/(54-68) 95/55 mmHg (03/02 0845) SpO2:  [98 %-100 %] 100 % (03/02 0845)  Intake/Output from previous day: 03/01 0701 - 03/02 0700 In: 657.5 [P.O.:360; I.V.:297.5] Out: 1400 [Urine:1400] Intake/Output this shift: Total I/O In: -  Out: 100 [Urine:100]  Exam:  Neurovascular intact Sensation intact distally Intact pulses distally  Labs:  Recent Labs  09/05/12 0517 09/06/12 0431  HGB 8.6* 9.4*    Recent Labs  09/05/12 0517 09/06/12 0431  WBC 5.4 5.5  RBC 2.73* 2.72*  HCT 26.4* 26.3*  PLT 244 208    Recent Labs  09/05/12 0517  NA 141  K 3.8  CL 109  CO2 26  BUN 8  CREATININE 0.59  GLUCOSE 101*  CALCIUM 8.1*   No results found for this basename: LABPT, INR,  in the last 72 hours  Assessment/Plan: Labs ok - bp low normal but asymptomatic - dc am   DEAN,GREGORY SCOTT 09/06/2012, 9:45 AM

## 2012-09-06 NOTE — Progress Notes (Signed)
Occupational Therapy Treatment Patient Details Name: Shannon Berger MRN: [id number] DOB: 1941/06/07 Today's Date: 09/06/2012 Time: 4696-2952 OT Time Calculation (min): 15 min  OT Assessment / Plan / Recommendation Comments on Treatment Session PT progressing well and is at adequate level from OT stand point for d/c home with family (A)    Follow Up Recommendations  No OT follow up;Supervision/Assistance - 24 hour    Barriers to Discharge       Equipment Recommendations  3 in 1 bedside comode    Recommendations for Other Services    Frequency Min 2X/week   Plan Discharge plan remains appropriate    Precautions / Restrictions Precautions Precautions: Fall Restrictions LLE Weight Bearing: Weight bearing as tolerated   Pertinent Vitals/Pain     ADL  Grooming: Teeth care;Modified independent Where Assessed - Grooming: Unsupported standing Tub/Shower Transfer: Supervision/safety Tub/Shower Transfer Method: Science writer: Walk in shower Equipment Used: Gait belt;Rolling walker Transfers/Ambulation Related to ADLs: pt ambulating supervision level to shower and back. Pt reports feeling slightly stiff ADL Comments: pt has (A) of husband for home. Pt able to demonstrate teach back and verbalize the sequence for shower transfer. Pt educated on edema management and need for ice     OT Diagnosis:    OT Problem List:   OT Treatment Interventions:     OT Goals Acute Rehab OT Goals OT Goal Formulation: With patient Time For Goal Achievement: 09/12/12 Potential to Achieve Goals: Good ADL Goals Pt Will Perform Tub/Shower Transfer: Shower transfer;with supervision;Ambulation;with DME;Shower seat without back ADL Goal: Web designer - Progress: Met  Visit Information  Last OT Received On: 09/06/12 Assistance Needed: +1    Subjective Data      Prior Functioning       Cognition  Cognition Overall Cognitive Status: Appears within  functional limits for tasks assessed/performed Arousal/Alertness: Awake/alert Orientation Level: Appears intact for tasks assessed Behavior During Session: [facility] for tasks performed    Mobility  Bed Mobility Bed Mobility: Not assessed Transfers Sit to Stand: 5: Supervision;With upper extremity assist;From bed;From chair/3-in-1 Stand to Sit: 5: Supervision;With upper extremity assist;With armrests;To chair/3-in-1;To bed    Exercises      Balance Static Standing Balance Static Standing - Balance Support: During functional activity Static Standing - Level of Assistance: 5: Stand by assistance   End of Session OT - End of Session Activity Tolerance: Patient tolerated treatment well Patient left: in chair;with call bell/phone within reach;with family/visitor present Nurse Communication: Mobility status  GO     Lucile Shutters 09/06/2012, 9:20 AM Pager: [phone]

## 2012-09-06 NOTE — Progress Notes (Signed)
Occupational Therapy Discharge Patient Details Name: JENIAH KISHI MRN: [id number] DOB: 22-Jul-1940 Today's Date: 09/06/2012 Time: 1027-2536 OT Time Calculation (min): 15 min  Patient discharged from OT services secondary to goals met and no further OT needs identified.  Please see latest therapy progress note for current level of functioning and progress toward goals.    Progress and discharge plan discussed with patient and/or caregiver: Patient/Caregiver agrees with plan  GO     Lucile Shutters Pager: [phone]  09/06/2012, 9:21 AM

## 2012-09-06 NOTE — Progress Notes (Signed)
Physical Therapy Treatment Patient Details Name: Shannon Berger MRN: [id number] DOB: [birth year] Today's Date: 09/06/2012 Time: 4098-1191 PT Time Calculation (min): 41 min  PT Assessment / Plan / Recommendation Comments on Treatment Session  progressing well; pt comfortable with steps, amb and ther ex; Pt to amb lagain later with husb, RN aware; Ice to left hip; pain controlled    Follow Up Recommendations  Home health PT     Does the patient have the potential to tolerate intense rehabilitation     Barriers to Discharge        Equipment Recommendations  None recommended by PT    Recommendations for Other Services    Frequency 7X/week   Plan Discharge plan remains appropriate    Precautions / Restrictions Precautions Precautions: None Restrictions LLE Weight Bearing: Weight bearing as tolerated   Pertinent Vitals/Pain     Mobility  Bed Mobility Bed Mobility: Sit to Supine Sit to Supine: 4: Min guard;5: Supervision (with use of sheet to self assist) Details for Bed Mobility Assistance: Assist for LLE into and out of bed with min cues for adjusting hips once in bed.  Transfers Transfers: Sit to Stand;Stand to Sit Sit to Stand: 5: Supervision;6: Modified independent (Device/Increase time) Stand to Sit: 5: Supervision;6: Modified independent (Device/Increase time) Ambulation/Gait Ambulation/Gait Assistance: 5: Supervision;6: Modified independent (Device/Increase time) Ambulation Distance (Feet): 400 Feet Assistive device: Rolling walker Ambulation/Gait Assistance Details: cues for step through, Rw position Gait Pattern: Step-to pattern;Step-through pattern Stairs Assistance: 4: Min guard Stair Management Technique: One rail Left;Step to pattern;With crutches;Forwards Number of Stairs: 4    Exercises Total Joint Exercises Ankle Circles/Pumps: AROM;Both;20 reps Heel Slides: AAROM;Left;10 reps   PT Diagnosis:    PT Problem List:   PT Treatment Interventions:      PT Goals Acute Rehab PT Goals Time For Goal Achievement: 09/11/12 Potential to Achieve Goals: Good Pt will go Supine/Side to Sit: with supervision Pt will go Sit to Supine/Side: with supervision PT Goal: Sit to Supine/Side - Progress: Partly met Pt will go Sit to Stand: with supervision PT Goal: Sit to Stand - Progress: Met Pt will Ambulate: 51 - 150 feet;with supervision;with rolling walker PT Goal: Ambulate - Progress: Met Pt will Go Up / Down Stairs: 3-5 stairs;with rail(s);with least restrictive assistive device;with min assist PT Goal: Up/Down Stairs - Progress: Met Pt will Perform Home Exercise Program: with supervision, verbal cues required/provided PT Goal: Perform Home Exercise Program - Progress: Progressing toward goal  Visit Information  Last PT Received On: 09/06/12 Assistance Needed: +1    Subjective Data  Subjective: i feel good Patient Stated Goal: home   Cognition  Cognition Overall Cognitive Status: Appears within functional limits for tasks assessed/performed Arousal/Alertness: Awake/alert Orientation Level: Appears intact for tasks assessed Behavior During Session: [facility] for tasks performed    Balance  Static Standing Balance Static Standing - Balance Support: During functional activity Static Standing - Level of Assistance: 5: Stand by assistance  End of Session PT - End of Session Equipment Utilized During Treatment: Gait belt Activity Tolerance: Patient tolerated treatment well Patient left: in bed;with call bell/phone within reach;with family/visitor present   GP     [facility] 09/06/2012, 12:16 PM

## 2012-09-07 ENCOUNTER — Encounter (HOSPITAL_COMMUNITY): Payer: Self-pay | Admitting: Orthopaedic Surgery

## 2012-09-07 LAB — CBC
HCT: 26.3 % — ABNORMAL LOW (ref 36.0–46.0)
Hemoglobin: 8.9 g/dL — ABNORMAL LOW (ref 12.0–15.0)
MCH: 33.2 pg (ref 26.0–34.0)
MCHC: 33.8 g/dL (ref 30.0–36.0)
MCV: 98.1 fL (ref 78.0–100.0)
Platelets: 204 10*3/uL (ref 150–400)
RBC: 2.68 MIL/uL — ABNORMAL LOW (ref 3.87–5.11)
RDW: 12.8 % (ref 11.5–15.5)
WBC: 4.4 10*3/uL (ref 4.0–10.5)

## 2012-09-07 MED ORDER — ASPIRIN 325 MG PO TBEC: 325.0000 mg | DELAYED_RELEASE_TABLET | Freq: Two times a day (BID) | ORAL | Status: DC

## 2012-09-07 MED ORDER — HYDROCODONE-ACETAMINOPHEN 5-325 MG PO TABS: 1.0000 | ORAL_TABLET | ORAL | Status: DC | PRN

## 2012-09-07 MED ORDER — METHOCARBAMOL 500 MG PO TABS: 500.0000 mg | ORAL_TABLET | Freq: Four times a day (QID) | ORAL | Status: DC | PRN

## 2012-09-07 NOTE — Progress Notes (Signed)
Patient ID: Shannon Berger, female   DOB: Sep 10, 1940, 72 y.o.   MRN: [id number] Doing very well.  Asymptomatic acute blood loss anemia.  AF/VSS. Incision Clean and dry.  Cleared therapy.  Can d/c/ to home today.

## 2012-09-07 NOTE — Discharge Summary (Signed)
Patient ID: Shannon Berger MRN: [id number] DOB/AGE: 1941/05/05 72 y.o.  Admit date: 09/04/2012 Discharge date: 09/07/2012  Admission Diagnoses:  Principal Problem:   Degenerative arthritis of hip   Discharge Diagnoses:  Same  Past Medical History  Diagnosis Date  . Arthritis   . Hypertension   . Sleep difficulties   . Carotid arterial disease   . Osteopenia   . Hyperlipidemia   . Osteoarthritis   . Constipation     Surgeries: Procedure(s): LEFT TOTAL HIP ARTHROPLASTY ANTERIOR APPROACH on 09/04/2012   Consultants:    Discharged Condition: Improved  Hospital Course: Shannon Berger is an 72 y.o. female who was admitted 09/04/2012 for operative treatment ofDegenerative arthritis of hip. Patient has severe unremitting pain that affects sleep, daily activities, and work/hobbies. After pre-op clearance the patient was taken to the operating room on 09/04/2012 and underwent  Procedure(s): LEFT TOTAL HIP ARTHROPLASTY ANTERIOR APPROACH.    Patient was given perioperative antibiotics: Anti-infectives   Start     Dose/Rate Route Frequency Ordered Stop   09/04/12 1400  ceFAZolin (ANCEF) IVPB 1 g/50 mL premix     1 g 100 mL/hr over 30 Minutes Intravenous Every 6 hours 09/04/12 1205 09/04/12 2143   09/04/12 0532  ceFAZolin (ANCEF) IVPB 2 g/50 mL premix     2 g 100 mL/hr over 30 Minutes Intravenous On call to O.R. 09/04/12 0532 09/04/12 0740       Patient was given sequential compression devices, early ambulation, and chemoprophylaxis to prevent DVT.  Patient benefited maximally from hospital stay and there were no complications.    Recent vital signs: Patient Vitals for the past 24 hrs:  BP Temp Temp src Pulse Resp SpO2  09/07/12 0605 121/69 mmHg 97.7 F (36.5 C) Oral 68 16 97 %  09/06/12 2115 123/69 mmHg 98.2 F (36.8 C) Oral 72 16 100 %  09/06/12 1435 96/53 mmHg 98.9 F (37.2 C) Oral 65 16 98 %  09/06/12 1111 109/57 mmHg - - - - -  09/06/12 1110 91/49 mmHg - -  72 - -  09/06/12 0845 95/55 mmHg 97.4 F (36.3 C) Oral 66 16 100 %     Recent laboratory studies:  Recent Labs  09/05/12 0517 09/06/12 0431 09/07/12 0418  WBC 5.4 5.5 4.4  HGB 8.6* 9.4* 8.9*  HCT 26.4* 26.3* 26.3*  PLT 244 208 204  NA 141  --   --   K 3.8  --   --   CL 109  --   --   CO2 26  --   --   BUN 8  --   --   CREATININE 0.59  --   --   GLUCOSE 101*  --   --   CALCIUM 8.1*  --   --      Discharge Medications:     Medication List    STOP taking these medications       aspirin 81 MG tablet     meloxicam 15 MG tablet  Commonly known as:  MOBIC      TAKE these medications       Acetyl L-Carnitine 500 MG Caps  Take 500 mg by mouth daily.     ALPRAZolam 0.5 MG tablet  Commonly known as:  XANAX  Take 0.25 mg by mouth at bedtime as needed for sleep.     aspirin 325 MG EC tablet  Take 1 tablet (325 mg total) by mouth 2 (two) times daily after a meal.  atorvastatin 20 MG tablet  Commonly known as:  LIPITOR  Take 20 mg by mouth at bedtime.     bisoprolol-hydrochlorothiazide 2.5-6.25 MG per tablet  Commonly known as:  ZIAC  Take 1 tablet by mouth every morning.     Co Q10 100 MG Tabs  Take 200 mg by mouth every morning.     DULoxetine 60 MG capsule  Commonly known as:  CYMBALTA  Take 60 mg by mouth at bedtime.     FISH OIL PO  Take 2,400 mg by mouth 2 (two) times daily.     HYDROcodone-acetaminophen 5-325 MG per tablet  Commonly known as:  NORCO  Take 1 tablet by mouth every 4 (four) hours as needed for pain.     MAGNESIUM PO  Take 400 mg by mouth daily.     methocarbamol 500 MG tablet  Commonly known as:  ROBAXIN  Take 1 tablet (500 mg total) by mouth every 6 (six) hours as needed.     multivitamin tablet  Take 1 tablet by mouth daily.     raloxifene 60 MG tablet  Commonly known as:  EVISTA  Take 60 mg by mouth every morning.     Turmeric 450 MG Caps  Take 9,000 mg by mouth every other day.     VITAMIN C PO  Take 700 mg by  mouth daily.     Vitamin D3 2000 UNITS Tabs  Take 2,000 mg by mouth daily.        Diagnostic Studies: Dg Chest 2 View  08/21/2012  *RADIOLOGY REPORT*  Clinical Data: Preop left hip replacement.  CHEST - 2 VIEW  Comparison: 02/19/2006  Findings: Lungs are adequately inflated without consolidation or effusion.  Cardiomediastinal silhouette is within normal.  There are findings suggesting multiple small oil cysts of the left breast unchanged.  There mild degenerate changes of the spine with progressive degenerate disc disease at several levels at the thoracolumbar junction / upper lumbar spine.  IMPRESSION: No acute cardiopulmonary disease.   Original Report Authenticated By: Elberta Fortis, M.D.    Dg Hip Complete Left  09/04/2012  *RADIOLOGY REPORT*  Clinical Data: Left anterior hip surgery.  DG C-ARM 61-120 MIN - NRPT [facility],LEFT HIP - COMPLETE 2+ VIEW  Comparison: None.  Findings: A single AP view of the left hip demonstrates that the patient is status post left total hip arthroplasty.  The femoral component projects over the acetabular cup.  No fracture is evident.  IMPRESSION:  1.  Status post left total hip arthroplasty without radiographic evidence for complication.   Original Report Authenticated By: Marin Roberts, M.D.    Dg Pelvis Portable  09/04/2012  *RADIOLOGY REPORT*  Clinical Data: Arthritis of the left hip.  PORTABLE PELVIS  Comparison: Intraoperative images dated 09/04/2012  Findings: Single AP view of the pelvis demonstrates the acetabular femoral components of the left total hip prosthesis to be in excellent position.  No fractures.  Subcutaneous air consistent with recent surgery.  IMPRESSION: Satisfactory appearance of the left hip in the AP projection after total hip prosthesis insertion.   Original Report Authenticated By: Francene Boyers, M.D.    Dg Hip Portable 1 View Left  09/04/2012  *RADIOLOGY REPORT*  Clinical Data: Postop left hip replacement  PORTABLE LEFT HIP - 1 VIEW   Comparison: None.  Findings: Left total hip arthroplasty without fracture or dislocation  IMPRESSION:  No complication following left hip arthroplasty.   Original Report Authenticated By: Genevive Bi, M.D.    Dg  C-arm 61-120 Min-no Report  09/04/2012  *RADIOLOGY REPORT*  Clinical Data: Left anterior hip surgery.  DG C-ARM 61-120 MIN - [facility]PT [facility],LEFT HIP - COMPLETE 2+ VIEW  Comparison: None.  Findings: A single AP view of the left hip demonstrates that the patient is status post left total hip arthroplasty.  The femoral component projects over the acetabular cup.  No fracture is evident.  IMPRESSION:  1.  Status post left total hip arthroplasty without radiographic evidence for complication.   Original Report Authenticated By: Marin Roberts, M.D.     Disposition: to home      Discharge Orders   Future Appointments Provider Department Dept Phone   01/20/2013 10:00 AM [facility] [phone]   01/20/2013 11:20 AM Evern Bio, NP [facility] [phone]   Future Orders Complete By Expires     Call MD / Call 911  As directed     Comments:      If you experience chest pain or shortness of breath, CALL 911 and be transported to the hospital emergency room.  If you develope a fever above 101 F, pus (white drainage) or increased drainage or redness at the wound, or calf pain, call your surgeon's office.    Constipation Prevention  As directed     Comments:      Drink plenty of fluids.  Prune juice may be helpful.  You may use a stool softener, such as Colace (over the counter) 100 mg twice a day.  Use MiraLax (over the counter) for constipation as needed.    Diet - low sodium heart healthy  As directed     Discharge instructions  As directed     Comments:      Increase your activities as comfort allows. You can remove your dressing and get your incision wet starting 08/12/12, then dry dressing daily. Expect knee  pain. Expect thigh, leg and foot swelling.    Discharge patient  As directed     Increase activity slowly as tolerated  As directed        Follow-up Information   Follow up with Kathryne Hitch, MD In 2 weeks.   Contact information:   [address] [place] [address] [phone]        Signed: Kathryne Hitch 09/07/2012, 7:41 AM

## 2012-09-07 NOTE — Progress Notes (Signed)
Physical Therapy Treatment Patient Details Name: Shannon Berger MRN: [id number] DOB: 1940/09/05 Today's Date: 09/07/2012 Time: 8295-6213 PT Time Calculation (min): 18 min  PT Assessment / Plan / Recommendation Comments on Treatment Session  Pt plans to D/C to home today. Performed steps with family one last time and performed standing TE's.   Pt awaiting on [facility] services to be confirmed.  No equipment, has a RW and was given a crutch.    Follow Up Recommendations  Home health PT     Does the patient have the potential to tolerate intense rehabilitation     Barriers to Discharge        Equipment Recommendations  None recommended by PT    Recommendations for Other Services    Frequency 7X/week   Plan Discharge plan remains appropriate    Precautions / Restrictions Precautions Precautions: None Restrictions Weight Bearing Restrictions: No LLE Weight Bearing: Weight bearing as tolerated   Pertinent Vitals/Pain C/o "soreness' ICE applied    Mobility  Bed Mobility Bed Mobility: Not assessed Details for Bed Mobility Assistance: Pt OOB in recliner Transfers Transfers: Sit to Stand;Stand to Sit Sit to Stand: 6: Modified independent (Device/Increase time);From chair/3-in-1 Stand to Sit: 6: Modified independent (Device/Increase time);To chair/3-in-1 Details for Transfer Assistance: good use of hands and increased time Ambulation/Gait Ambulation/Gait Assistance: 6: Modified independent (Device/Increase time) Ambulation Distance (Feet): 85 Feet Assistive device: Rolling walker Ambulation/Gait Assistance Details: increased time and one VC on safety with backward gait Gait Pattern: Step-to pattern;Step-through pattern Gait velocity: decreased Stairs: Yes Stairs Assistance: 4: Min guard;5: Supervision Stairs Assistance Details (indicate cue type and reason): with family and one rail and one crutch.  Performed yesterday but still needed 2 VC's with crutch sequencing. Stair  Management Technique: One rail Left;Step to pattern;With crutches;Forwards Number of Stairs: 10    Exercises Total Joint Exercises Marching in Standing: AROM;Left;10 reps;Standing Standing Hip Extension: AROM;Left;10 reps;Standing Standing hip ABD;AROM; Left; 10 reps; standing    PT Goals                                                              progressing    Visit Information  Last PT Received On: 09/07/12 Assistance Needed: +1    Subjective Data  Subjective: I am ready to go home   Cognition    good   Balance   good  End of Session PT - End of Session Equipment Utilized During Treatment: Gait belt Activity Tolerance: Patient tolerated treatment well Patient left: in chair;with family/visitor present   Felecia Shelling  PTA [facility]  Acute  Rehab Pager      [phone]

## 2012-09-07 NOTE — Progress Notes (Signed)
Discharge summary sent to payer through [employer]  

## 2012-09-07 NOTE — Care Management Note (Signed)
    Page 1 of 2   09/07/2012     9:46:14 AM   CARE MANAGEMENT NOTE 09/07/2012  Patient:  Shannon Berger, Shannon Berger   Account Number:  [id number]  Date Initiated:  09/07/2012  Documentation initiated by:  Lorenda Ishihara  Subjective/Objective Assessment:   72 yo female admitted s/p left total hip arthroplasty with anterior approach. PTA lived at home with spouse.     Action/Plan:   Home when stable   Anticipated DC Date:  09/07/2012   Anticipated DC Plan:  HOME W HOME HEALTH SERVICES      DC Planning Services  CM consult      [facility]  HOME HEALTH  DURABLE MEDICAL EQUIPMENT   Choice offered to / List presented to:  C-1 Patient   DME arranged  3-N-1      DME agency  [facility]     HH arranged  HH-2 PT      [facility] agency  [facility]   Status of service:  Completed, signed off Medicare Important Message given?   (If response is "NO", the following Medicare IM given date fields will be blank) Date Medicare IM given:   Date Additional Medicare IM given:    Discharge Disposition:  HOME W HOME HEALTH SERVICES  Per UR Regulation:  Reviewed for med. necessity/level of care/duration of stay  If discussed at Long Length of Stay Meetings, dates discussed:    Comments:

## 2012-10-04 ENCOUNTER — Emergency Department (HOSPITAL_COMMUNITY): Payer: Medicare Other

## 2012-10-04 ENCOUNTER — Emergency Department (HOSPITAL_COMMUNITY)
Admission: EM | Admit: 2012-10-04 | Discharge: 2012-10-05 | Disposition: A | Discharge status: Home / Self Care | Payer: Medicare Other | Attending: Emergency Medicine | Admitting: Emergency Medicine

## 2012-10-04 DIAGNOSIS — M899 Disorder of bone, unspecified: Secondary | ICD-10-CM | POA: Insufficient documentation

## 2012-10-04 DIAGNOSIS — Z79899 Other long term (current) drug therapy: Secondary | ICD-10-CM | POA: Insufficient documentation

## 2012-10-04 DIAGNOSIS — S73005A Unspecified dislocation of left hip, initial encounter: Secondary | ICD-10-CM

## 2012-10-04 DIAGNOSIS — X500XXA Overexertion from strenuous movement or load, initial encounter: Secondary | ICD-10-CM | POA: Insufficient documentation

## 2012-10-04 DIAGNOSIS — Z8719 Personal history of other diseases of the digestive system: Secondary | ICD-10-CM | POA: Insufficient documentation

## 2012-10-04 DIAGNOSIS — Z8669 Personal history of other diseases of the nervous system and sense organs: Secondary | ICD-10-CM | POA: Insufficient documentation

## 2012-10-04 DIAGNOSIS — M129 Arthropathy, unspecified: Secondary | ICD-10-CM | POA: Insufficient documentation

## 2012-10-04 DIAGNOSIS — S73006A Unspecified dislocation of unspecified hip, initial encounter: Secondary | ICD-10-CM | POA: Insufficient documentation

## 2012-10-04 DIAGNOSIS — Z7982 Long term (current) use of aspirin: Secondary | ICD-10-CM | POA: Insufficient documentation

## 2012-10-04 DIAGNOSIS — Z96649 Presence of unspecified artificial hip joint: Secondary | ICD-10-CM | POA: Insufficient documentation

## 2012-10-04 DIAGNOSIS — E785 Hyperlipidemia, unspecified: Secondary | ICD-10-CM | POA: Insufficient documentation

## 2012-10-04 DIAGNOSIS — Y9389 Activity, other specified: Secondary | ICD-10-CM | POA: Insufficient documentation

## 2012-10-04 DIAGNOSIS — I10 Essential (primary) hypertension: Secondary | ICD-10-CM | POA: Insufficient documentation

## 2012-10-04 DIAGNOSIS — Y9289 Other specified places as the place of occurrence of the external cause: Secondary | ICD-10-CM | POA: Insufficient documentation

## 2012-10-04 DIAGNOSIS — M1990 Unspecified osteoarthritis, unspecified site: Secondary | ICD-10-CM | POA: Insufficient documentation

## 2012-10-04 DIAGNOSIS — Z8679 Personal history of other diseases of the circulatory system: Secondary | ICD-10-CM | POA: Insufficient documentation

## 2012-10-04 MED ORDER — FENTANYL CITRATE 0.05 MG/ML IJ SOLN
75.0000 ug | INTRAMUSCULAR | Status: DC | PRN
  Administered 2012-10-05 (×2): 75 ug via INTRAVENOUS
  Filled 2012-10-04 (×2): qty 2

## 2012-10-04 MED ORDER — FENTANYL CITRATE 0.05 MG/ML IJ SOLN
75.0000 ug | Freq: Once | INTRAMUSCULAR | Status: AC
  Administered 2012-10-04: 75 ug via INTRAVENOUS
  Filled 2012-10-04: qty 2

## 2012-10-04 NOTE — ED Notes (Signed)
WJX:[id number]<WG> Expected date:10/04/12<BR> Expected time:11:12 PM<BR> Means of arrival:Ambulance<BR> Comments:<BR> Hip pain

## 2012-10-04 NOTE — ED Notes (Signed)
Pt is HOH. 

## 2012-10-04 NOTE — ED Notes (Signed)
Pt had fluid taken out of surgical area on left hip by Dr. Magnus Ivan on last Tuesday  and was put on antibiotics for 2 weeks because of possible infection at bottom of surgical site.

## 2012-10-04 NOTE — ED Notes (Signed)
Per EMS report: pt from home: pt was found on the commode at [facility].  Pt's hip locked while sitting on commode.  Pt unable to move.  EMS noticed deformity and dislocation.  Pt had hip surgery at the end of February.  IV: 22g left hand.  EMS gave pt of fentanyl. BP: 150/67, HR: 78, 100% RA, RR: 20

## 2012-10-04 NOTE — ED Notes (Signed)
Pt has been having episodes of hip locking up and popping since surgery pt had an xray on last Thursday and Md said everything looked fine.

## 2012-10-05 ENCOUNTER — Emergency Department (HOSPITAL_COMMUNITY): Payer: Medicare Other

## 2012-10-05 MED ORDER — METHOCARBAMOL 500 MG PO TABS: 500.0000 mg | ORAL_TABLET | Freq: Four times a day (QID) | ORAL | Status: DC | PRN

## 2012-10-05 MED ORDER — MORPHINE SULFATE 4 MG/ML IJ SOLN: 4.0000 mg | Freq: Once | INTRAMUSCULAR | Status: DC

## 2012-10-05 MED ORDER — PROPOFOL 10 MG/ML IV BOLUS
INTRAVENOUS | Status: AC | PRN
  Administered 2012-10-05: 25 mg via INTRAVENOUS

## 2012-10-05 MED ORDER — DIAZEPAM 5 MG PO TABS: 5.0000 mg | ORAL_TABLET | Freq: Once | ORAL | Status: DC

## 2012-10-05 MED ORDER — HYDROCODONE-ACETAMINOPHEN 10-325 MG PO TABS
1.0000 | ORAL_TABLET | ORAL | Status: DC | PRN
  Administered 2012-10-05: 1 via ORAL
  Filled 2012-10-05: qty 1

## 2012-10-05 MED ORDER — HYDROCODONE-ACETAMINOPHEN 5-325 MG PO TABS: 1.0000 | ORAL_TABLET | ORAL | Status: DC | PRN

## 2012-10-05 MED ORDER — DIAZEPAM 5 MG/ML IJ SOLN
2.5000 mg | Freq: Once | INTRAMUSCULAR | Status: AC
  Administered 2012-10-05: 2.5 mg via INTRAVENOUS
  Filled 2012-10-05: qty 2

## 2012-10-05 MED ORDER — PROPOFOL 10 MG/ML IV BOLUS
0.5000 mg/kg | Freq: Once | INTRAVENOUS | Status: DC
  Filled 2012-10-05: qty 1

## 2012-10-05 NOTE — ED Notes (Signed)
Patient transported to X-ray 

## 2012-10-05 NOTE — ED Provider Notes (Signed)
History     CSN: [id number]  Arrival date & time 10/04/12  2320   First MD Initiated Contact with Patient 10/04/12 2329      No chief complaint on file.   (Consider location/radiation/quality/duration/timing/severity/associated sxs/prior treatment) HPI 72 year old female presents emergency department via EMS with complaint of left hip pain.  Patient is status post left hip arthroplasty.  Patient reports she sat down on the toilet at [facility] and was unable to get up.  No prior h/o dislocation.  No trauma.  Pt reports she has had some "popping" and "snapping" of the joint since the surgery.  Pt has been seen back at her orthopedists due to pain when leaning down to get soap in the shower, but had normal xrays.  She was told she needed to limit her activities.   Past Medical History  Diagnosis Date  . Arthritis   . Hypertension   . Sleep difficulties   . Carotid arterial disease   . Osteopenia   . Hyperlipidemia   . Osteoarthritis   . Constipation     Past Surgical History  Procedure Laterality Date  . Bunionectomy    . Tonsillectomy and adenoidectomy    . Tendon repair      left hip  . Foot surgery      Status post right foot surgery  . Other surgical history      post fusion of her right thumb  . Pilonidal cyst / sinus excision    . Hand fusion      Right thumb  . Tendon repair      Reattachment of tendon- Left hip  . Total hip arthroplasty Left 09/04/2012    Procedure: LEFT TOTAL HIP ARTHROPLASTY ANTERIOR APPROACH;  Surgeon: Kathryne Hitch, MD;  Location: [facility] ORS;  Service: Orthopedics;  Laterality: Left;  Left Total Hip Arthroplasty, Anterior Approach    Family History  Problem Relation Age of Onset  . Heart failure Mother   . Cancer Mother   . Hypertension Mother   . Other Mother     Varicose Veins  . Diabetes Father   . Heart disease Father   . Hypertension Father   . Heart attack Father     History  Substance Use Topics  . Smoking status: Never  Smoker   . Smokeless tobacco: Never Used  . Alcohol Use: Yes     Comment: 2 gl wine per week    OB History   Grav Para Term Preterm Abortions TAB SAB Ect Mult Living                  Review of Systems  All other systems reviewed and are negative.    Allergies  Review of patient's allergies indicates no known allergies.  Home Medications   Current Outpatient Rx  Name  Route  Sig  Dispense  Refill  . ALPRAZolam (XANAX) 0.5 MG tablet   Oral   Take 0.25 mg by mouth at bedtime as needed for sleep.          . Ascorbic Acid (VITAMIN C PO)   Oral   Take 700 mg by mouth daily.         Marland Kitchen aspirin EC 81 MG tablet   Oral   Take 81 mg by mouth daily.         Marland Kitchen atorvastatin (LIPITOR) 20 MG tablet   Oral   Take 20 mg by mouth at bedtime.          Marland Kitchen  bisoprolol-hydrochlorothiazide (ZIAC) 2.5-6.25 MG per tablet   Oral   Take 1 tablet by mouth every morning.          . Cholecalciferol (VITAMIN D3) 2000 UNITS TABS   Oral   Take 2,000 mg by mouth daily.          . Coenzyme Q10 (CO Q10) 100 MG TABS   Oral   Take 200 mg by mouth every morning.         Marland Kitchen doxycycline (VIBRA-TABS) 100 MG tablet   Oral   Take 100 mg by mouth 2 (two) times daily. Taking for 14 days for infection from surgery         . DULoxetine (CYMBALTA) 60 MG capsule   Oral   Take 60 mg by mouth at bedtime.          Marland Kitchen MAGNESIUM PO   Oral   Take 400 mg by mouth daily.          . Multiple Vitamin (MULTIVITAMIN) tablet   Oral   Take 1 tablet by mouth daily.           . Omega-3 Fatty Acids (FISH OIL PO)   Oral   Take 2,400 mg by mouth 2 (two) times daily.          . raloxifene (EVISTA) 60 MG tablet   Oral   Take 60 mg by mouth every morning.         . tretinoin (RETIN-A) 0.025 % cream   Topical   Apply 1 application topically at bedtime. Uses on Face only         . HYDROcodone-acetaminophen (NORCO) 5-325 MG per tablet   Oral   Take 1 tablet by mouth every 4 (four) hours  as needed for pain.   60 tablet   0   . methocarbamol (ROBAXIN) 500 MG tablet   Oral   Take 1 tablet (500 mg total) by mouth every 6 (six) hours as needed.   30 tablet   0     BP 139/57  Pulse 67  Temp(Src) 97.9 F (36.6 C) (Oral)  Resp 17  Ht 5\' 2"  (1.575 m)  Wt 114 lb (51.71 kg)  BMI 20.85 kg/m2  SpO2 99%  Physical Exam  Nursing note and vitals reviewed. Constitutional: She is oriented to person, place, and time. She appears well-developed and well-nourished.  HENT:  Head: Normocephalic and atraumatic.  Nose: Nose normal.  Mouth/Throat: Oropharynx is clear and moist.  Eyes: Conjunctivae and EOM are normal. Pupils are equal, round, and reactive to light.  Neck: Normal range of motion. Neck supple. No JVD present. No tracheal deviation present. No thyromegaly present.  Cardiovascular: Normal rate, regular rhythm, normal heart sounds and intact distal pulses.  Exam reveals no gallop and no friction rub.   No murmur heard. Pulmonary/Chest: Effort normal and breath sounds normal. No stridor. No respiratory distress. She has no wheezes. She has no rales. She exhibits no tenderness.  Abdominal: Soft. Bowel sounds are normal. She exhibits no distension and no mass. There is no tenderness. There is no rebound and no guarding.  Musculoskeletal: Normal range of motion. She exhibits no edema and no tenderness.  Left hip with deformity noted, held in flexion.  Distally neurovascularly intact  Lymphadenopathy:    She has no cervical adenopathy.  Neurological: She is alert and oriented to person, place, and time. She exhibits normal muscle tone. Coordination normal.  Skin: Skin is warm and dry. No rash noted. No  erythema. No pallor.  Psychiatric: She has a normal mood and affect. Her behavior is normal. Judgment and thought content normal.    ED Course  Reduction of dislocation Date/Time: 10/05/2012 1:05 AM Performed by: Olivia Mackie Authorized by: Olivia Mackie Consent: Verbal  consent obtained. written consent obtained. Risks and benefits: risks, benefits and alternatives were discussed Consent given by: patient Patient understanding: patient states understanding of the procedure being performed Patient consent: the patient's understanding of the procedure matches consent given Procedure consent: procedure consent matches procedure scheduled Relevant documents: relevant documents present and verified Test results: test results available and properly labeled Imaging studies: imaging studies available Required items: required blood products, implants, devices, and special equipment available Patient identity confirmed: verbally with patient and arm band Preparation: Patient was prepped and draped in the usual sterile fashion. Local anesthesia used: no Patient sedated: yes Sedation type: moderate (conscious) sedation Sedatives: propofol Analgesia: fentanyl Sedation start date/time: 10/05/2012 1:10 AM Sedation end date/time: 10/05/2012 1:19 AM Vitals: Vital signs were monitored during sedation. Patient tolerance: Patient tolerated the procedure well with no immediate complications. Comments: Left hip reduced with "captain morgan" technique.  Pt with good reduction but had second pop when straightening hip out.  Pt readjusted, reduction achieved again and knee immobilizer applied.  Procedural sedation Date/Time: 10/05/2012 1:05 AM Performed by: Olivia Mackie Authorized by: Olivia Mackie Consent: Verbal consent obtained. written consent obtained. Risks and benefits: risks, benefits and alternatives were discussed Consent given by: patient Patient understanding: patient states understanding of the procedure being performed Patient consent: the patient's understanding of the procedure matches consent given Procedure consent: procedure consent matches procedure scheduled Relevant documents: relevant documents present and verified Test results: test results available  and properly labeled Imaging studies: imaging studies available Required items: required blood products, implants, devices, and special equipment available Patient identity confirmed: verbally with patient and arm band Time out: Immediately prior to procedure a "time out" was called to verify the correct patient, procedure, equipment, support staff and site/side marked as required. Preparation: Patient was prepped and draped in the usual sterile fashion. Local anesthesia used: no Patient sedated: yes Sedation type: moderate (conscious) sedation Sedatives: propofol Analgesia: fentanyl Vitals: Vital signs were monitored during sedation. Patient tolerance: Patient tolerated the procedure well with no immediate complications.   (including critical care time)  Labs Reviewed - No data to display Dg Hip Complete Left  10/05/2012  *RADIOLOGY REPORT*  Clinical Data: Post reduction  LEFT HIP - COMPLETE 2+ VIEW  Comparison: 0010 hours  Findings: There is now anatomic alignment of the left total hip arthroplasty.  No breakage or loosening of the hardware. Osteopenia.  IMPRESSION: Anatomic reduction of the left total hip arthroplasty dislocation.   Original Report Authenticated By: Jolaine Click, M.D.    Dg Hip Complete Left  10/05/2012  *RADIOLOGY REPORT*  Clinical Data: Left hip pain and deformity.  LEFT HIP - COMPLETE 2+ VIEW  Comparison: Left hip fluoroscopy 09/04/2012  Findings: Left total hip arthroplasty with noncemented components. Interval dislocation of the left hip prosthesis with superior and posterior dislocation of the femoral component with respect to the acetabular component.  There is no evidence of associated fracture.  IMPRESSION: Dislocated left hip arthroplasty.   Original Report Authenticated By: Burman Nieves, M.D.      1. Hip dislocation, left, initial encounter       MDM  72 year old female with left hip dislocation.  Hip was reduced well.  Patient did have some  pain  postprocedure attributed to muscle spasms.  After Valium he should reports improvement in pain.  She was able to ambulate on crutches.  She is to followup with orthopedics.  I discussed case with Dr. Ophelia Charter this morning to update him on patient's condition.        Olivia Mackie, MD 10/05/12 [phone]

## 2012-10-05 NOTE — ED Notes (Signed)
Pt able to walk with crutches.

## 2012-12-11 DIAGNOSIS — Z96649 Presence of unspecified artificial hip joint: Secondary | ICD-10-CM | POA: Insufficient documentation

## 2012-12-11 DIAGNOSIS — T84029A Dislocation of unspecified internal joint prosthesis, initial encounter: Secondary | ICD-10-CM | POA: Insufficient documentation

## 2012-12-28 ENCOUNTER — Other Ambulatory Visit: Payer: Self-pay | Admitting: Family Medicine

## 2012-12-28 DIAGNOSIS — M25552 Pain in left hip: Secondary | ICD-10-CM

## 2012-12-29 ENCOUNTER — Ambulatory Visit
Admission: RE | Admit: 2012-12-29 | Discharge: 2012-12-29 | Disposition: A | Discharge status: Home / Self Care | Payer: Medicare Other | Source: Ambulatory Visit | Attending: Family Medicine | Admitting: Family Medicine

## 2012-12-29 DIAGNOSIS — M25552 Pain in left hip: Secondary | ICD-10-CM

## 2013-01-17 ENCOUNTER — Other Ambulatory Visit: Payer: Self-pay | Admitting: Orthopaedic Surgery

## 2013-01-17 DIAGNOSIS — M25552 Pain in left hip: Secondary | ICD-10-CM

## 2013-01-19 ENCOUNTER — Ambulatory Visit
Admission: RE | Admit: 2013-01-19 | Discharge: 2013-01-19 | Disposition: A | Discharge status: Home / Self Care | Payer: Medicare Other | Source: Ambulatory Visit | Attending: Orthopaedic Surgery | Admitting: Orthopaedic Surgery

## 2013-01-19 DIAGNOSIS — M25552 Pain in left hip: Secondary | ICD-10-CM

## 2013-01-19 LAB — SYNOVIAL FLUID PANEL
Crystals, Fluid: NONE SEEN
Eosinophils-Synovial: 1 % (ref 0–1)
Glucose, Synovial Fluid: 66 mg/dL
Lymphocytes-Synovial Fld: 40 % — ABNORMAL HIGH (ref 0–20)
Monocyte/Macrophage: 5 % — ABNORMAL LOW (ref 50–90)
Neutrophil, Synovial: 54 % — ABNORMAL HIGH (ref 0–25)
Total protein, fluid: 2.2 g/dL
WBC, Synovial: 455 cu mm — ABNORMAL HIGH (ref 0–200)

## 2013-01-20 ENCOUNTER — Other Ambulatory Visit (INDEPENDENT_AMBULATORY_CARE_PROVIDER_SITE_OTHER): Payer: Medicare Other | Admitting: *Deleted

## 2013-01-20 ENCOUNTER — Ambulatory Visit: Payer: Medicare Other | Admitting: Neurosurgery

## 2013-01-20 DIAGNOSIS — I6529 Occlusion and stenosis of unspecified carotid artery: Secondary | ICD-10-CM

## 2013-01-21 ENCOUNTER — Other Ambulatory Visit: Payer: Self-pay | Admitting: *Deleted

## 2013-01-22 LAB — BODY FLUID CULTURE
Gram Stain: NONE SEEN
Gram Stain: NONE SEEN
Organism ID, Bacteria: NO GROWTH

## 2013-01-25 ENCOUNTER — Encounter: Payer: Self-pay | Admitting: Vascular Surgery

## 2013-02-10 ENCOUNTER — Other Ambulatory Visit: Payer: Self-pay

## 2013-05-13 ENCOUNTER — Other Ambulatory Visit: Payer: Self-pay

## 2013-08-24 ENCOUNTER — Other Ambulatory Visit: Payer: Self-pay | Admitting: Vascular Surgery

## 2013-08-24 DIAGNOSIS — I6529 Occlusion and stenosis of unspecified carotid artery: Secondary | ICD-10-CM

## 2014-01-25 ENCOUNTER — Encounter: Payer: Self-pay | Admitting: Vascular Surgery

## 2014-01-26 ENCOUNTER — Ambulatory Visit (INDEPENDENT_AMBULATORY_CARE_PROVIDER_SITE_OTHER): Payer: Medicare Other | Admitting: Vascular Surgery

## 2014-01-26 ENCOUNTER — Ambulatory Visit (HOSPITAL_COMMUNITY)
Admission: RE | Admit: 2014-01-26 | Discharge: 2014-01-26 | Disposition: A | Discharge status: Home / Self Care | Payer: Medicare Other | Source: Ambulatory Visit | Attending: Vascular Surgery | Admitting: Vascular Surgery

## 2014-01-26 ENCOUNTER — Encounter: Payer: Self-pay | Admitting: Vascular Surgery

## 2014-01-26 VITALS — BP 171/65 | HR 59 | Resp 18 | Ht 62.0 in | Wt 117.0 lb

## 2014-01-26 DIAGNOSIS — I6529 Occlusion and stenosis of unspecified carotid artery: Secondary | ICD-10-CM

## 2014-01-26 NOTE — Assessment & Plan Note (Signed)
This patient has an asymptomatic 60-79% left carotid stenosis and an asymptomatic 40-59% right carotid stenosis. She understands we would not consider carotid endarterectomy unless the stenosis progressed to greater than 80% or she develops focal neurologic symptoms. She knows to continue taking her aspirin and Lipitor. We have also discussed the importance of exercise nutrition. I've ordered a fall carotid duplex scan in 6 months and I will see her back at that time. She knows to call sooner if she has problems.

## 2014-01-26 NOTE — Progress Notes (Signed)
[facility] [place]  Patient name: Shannon Berger MRN: [id number] DOB: 06/14/1941 Sex: female  REASON FOR VISIT: Follow up of carotid disease.  HPI: Shannon Berger is a 73 y.o. female who was last seen in our office by our nurse practitioner on 07/23/2012. At that time she was asymptomatic and her duplex scan showed a less than 39% right carotid stenosis with a 40-59% left carotid stenosis. She comes in for a routine follow up visit. Since we saw her last, she denies any history of stroke, TIAs, expressive or receptive aphasia, or amaurosis fugax. She is on aspirin and is on a statin.  Past Medical History  Diagnosis Date  . Arthritis   . Hypertension   . Sleep difficulties   . Carotid arterial disease   . Osteopenia   . Hyperlipidemia   . Osteoarthritis   . Constipation    Family History  Problem Relation Age of Onset  . Heart failure Mother   . Cancer Mother   . Hypertension Mother   . Other Mother     Varicose Veins  . Diabetes Father   . Heart disease Father   . Hypertension Father   . Heart attack Father    SOCIAL HISTORY: History  Substance Use Topics  . Smoking status: Never Smoker   . Smokeless tobacco: Never Used  . Alcohol Use: Yes     Comment: 2 gl wine per week   No Known Allergies Current Outpatient Prescriptions  Medication Sig Dispense Refill  . ALPRAZolam (XANAX) 0.5 MG tablet Take 0.25 mg by mouth at bedtime as needed for sleep.       . Ascorbic Acid (VITAMIN C PO) Take 1,000 mg by mouth daily.       Marland Kitchen aspirin EC 81 MG tablet Take 81 mg by mouth daily.      Marland Kitchen atorvastatin (LIPITOR) 20 MG tablet Take 20 mg by mouth at bedtime.       . bisoprolol-hydrochlorothiazide (ZIAC) 2.5-6.25 MG per tablet Take 1 tablet by mouth every morning.       . Cholecalciferol (VITAMIN D3) 2000 UNITS TABS Take 2,000 mg by mouth daily.       . Coenzyme Q10 (CO Q10) 100 MG TABS Take 200 mg by mouth every morning.      . DULoxetine  (CYMBALTA) 60 MG capsule Take 60 mg by mouth at bedtime.       . Multiple Vitamin (MULTIVITAMIN) tablet Take 1 tablet by mouth daily.        . Omega-3 Fatty Acids (FISH OIL PO) Take 2,400 mg by mouth 2 (two) times daily.       . raloxifene (EVISTA) 60 MG tablet Take 60 mg by mouth every morning.      . tretinoin (RETIN-A) 0.025 % cream Apply 1 application topically at bedtime. Uses on Face only      . doxycycline (VIBRA-TABS) 100 MG tablet Take 100 mg by mouth 2 (two) times daily. Taking for 14 days for infection from surgery      . HYDROcodone-acetaminophen (NORCO) 5-325 MG per tablet Take 1 tablet by mouth every 4 (four) hours as needed for pain.  60 tablet  0  . MAGNESIUM PO Take 400 mg by mouth daily.       . methocarbamol (ROBAXIN) 500 MG tablet Take 1 tablet (500 mg total) by mouth every 6 (six) hours as needed.  30 tablet  0   No current facility-administered medications for this  visit.   REVIEW OF SYSTEMS: Valu.Nieves ] denotes positive finding; [  ] denotes negative finding  CARDIOVASCULAR:  [ ]  chest pain   [ ]  chest pressure   Valu.Nieves ] palpitations   [ ]  orthopnea   [ ]  dyspnea on exertion   [ ]  claudication   [ ]  rest pain   [ ]  DVT   [ ]  phlebitis PULMONARY:   [ ]  productive cough   [ ]  asthma   [ ]  wheezing NEUROLOGIC:   [ ]  weakness  [ ]  paresthesias  [ ]  aphasia  [ ]  amaurosis  [ ]  dizziness HEMATOLOGIC:   [ ]  bleeding problems   [ ]  clotting disorders MUSCULOSKELETAL:  [ ]  joint pain   [ ]  joint swelling [ ]  leg swelling GASTROINTESTINAL: [ ]   blood in stool  [ ]   hematemesis GENITOURINARY:  [ ]   dysuria  [ ]   hematuria PSYCHIATRIC:  [ ]  history of major depression INTEGUMENTARY:  [ ]  rashes  [ ]  ulcers CONSTITUTIONAL:  [ ]  fever   [ ]  chills  PHYSICAL EXAM: Filed Vitals:   01/26/14 1535 01/26/14 1548  BP: 166/70 171/65  Pulse: 61 59  Resp: 18   Height: 5\' 2"  (1.575 m)   Weight: 117 lb (53.071 kg)    Body mass index is 21.39 kg/(m^2). GENERAL: The patient is a well-nourished  female, in no acute distress. The vital signs are documented above. CARDIOVASCULAR: There is a regular rate and rhythm. I do not detect carotid bruits. PULMONARY: There is good air exchange bilaterally without wheezing or rales. ABDOMEN: Soft and non-tender with normal pitched bowel sounds.  MUSCULOSKELETAL: There are no major deformities or cyanosis. NEUROLOGIC: No focal weakness or paresthesias are detected. SKIN: There are no ulcers or rashes noted. PSYCHIATRIC: The patient has a normal affect.  DATA:  I have independently interpreted her carotid duplex scan which shows that she has a 40-59% right carotid stenosis. The stenosis on the left has progressed and is now in the 60-79% range.  MEDICAL ISSUES:  Occlusion and stenosis of carotid artery without mention of cerebral infarction This patient has an asymptomatic 60-79% left carotid stenosis and an asymptomatic 40-59% right carotid stenosis. She understands we would not consider carotid endarterectomy unless the stenosis progressed to greater than 80% or she develops focal neurologic symptoms. She knows to continue taking her aspirin and Lipitor. We have also discussed the importance of exercise nutrition. I've ordered a fall carotid duplex scan in 6 months and I will see her back at that time. She knows to call sooner if she has problems.    Return in about 6 months (around 07/29/2014).  [place] [facility] [place] Beeper: [phone]

## 2014-04-07 ENCOUNTER — Encounter: Payer: Self-pay | Admitting: Internal Medicine

## 2014-08-01 DIAGNOSIS — M5136 Other intervertebral disc degeneration, lumbar region: Secondary | ICD-10-CM | POA: Insufficient documentation

## 2014-08-01 DIAGNOSIS — M545 Low back pain, unspecified: Secondary | ICD-10-CM | POA: Insufficient documentation

## 2014-08-03 ENCOUNTER — Other Ambulatory Visit (HOSPITAL_COMMUNITY): Payer: Medicare Other

## 2014-08-03 ENCOUNTER — Ambulatory Visit: Payer: Medicare Other | Admitting: Vascular Surgery

## 2014-08-09 ENCOUNTER — Encounter: Payer: Self-pay | Admitting: Vascular Surgery

## 2014-08-10 ENCOUNTER — Other Ambulatory Visit (HOSPITAL_COMMUNITY): Payer: Medicare Other

## 2014-08-10 ENCOUNTER — Ambulatory Visit: Payer: Medicare Other | Admitting: Vascular Surgery

## 2014-08-15 ENCOUNTER — Encounter: Payer: Self-pay | Admitting: Vascular Surgery

## 2014-08-15 DIAGNOSIS — M533 Sacrococcygeal disorders, not elsewhere classified: Secondary | ICD-10-CM | POA: Insufficient documentation

## 2014-08-17 ENCOUNTER — Ambulatory Visit (HOSPITAL_COMMUNITY)
Admission: RE | Admit: 2014-08-17 | Discharge: 2014-08-17 | Disposition: A | Discharge status: Home / Self Care | Payer: Medicare Other | Source: Ambulatory Visit | Attending: Vascular Surgery | Admitting: Vascular Surgery

## 2014-08-17 ENCOUNTER — Ambulatory Visit (INDEPENDENT_AMBULATORY_CARE_PROVIDER_SITE_OTHER): Payer: Medicare Other | Admitting: Vascular Surgery

## 2014-08-17 ENCOUNTER — Encounter: Payer: Self-pay | Admitting: Vascular Surgery

## 2014-08-17 VITALS — BP 178/68 | HR 66 | Ht 62.0 in | Wt 115.0 lb

## 2014-08-17 DIAGNOSIS — I6523 Occlusion and stenosis of bilateral carotid arteries: Secondary | ICD-10-CM | POA: Diagnosis not present

## 2014-08-17 NOTE — Progress Notes (Signed)
[facility] [place]  Patient name: Shannon Berger MRN: [id number] DOB: 03-01-1941 Sex: female  REASON FOR VISIT: Follow up of bilateral carotid disease  HPI: Shannon Berger is a 74 y.o. female who I last saw on 01/26/2014 with bilateral carotid stenoses. At that time, there was a 40-59% right carotid stenosis with a 60-79% left carotid stenosis. She was asymptomatic so she was set up for 6 month follow up study. Since I saw her last, she denies any history of stroke, TIAs, expressive or receptive aphasia, or amaurosis fugax. She is on aspirin and is on Lipitor.  She does state that recently she was at her appointment to have her hip replacements checked and her blood pressure was significantly elevated. She has been somewhat concerned about this.   Past Medical History  Diagnosis Date  . Arthritis   . Hypertension   . Sleep difficulties   . Carotid arterial disease   . Osteopenia   . Hyperlipidemia   . Osteoarthritis   . Constipation    Family History  Problem Relation Age of Onset  . Heart failure Mother   . Cancer Mother   . Hypertension Mother   . Other Mother     Varicose Veins  . Diabetes Father   . Heart disease Father   . Hypertension Father   . Heart attack Father    SOCIAL HISTORY: History  Substance Use Topics  . Smoking status: Never Smoker   . Smokeless tobacco: Never Used  . Alcohol Use: 0.0 oz/week    0 Standard drinks or equivalent per week     Comment: 2 gl wine per week   No Known Allergies Current Outpatient Prescriptions  Medication Sig Dispense Refill  . ALPRAZolam (XANAX) 0.5 MG tablet Take 0.25 mg by mouth at bedtime as needed for sleep.     . Ascorbic Acid (VITAMIN C PO) Take 1,000 mg by mouth daily.     Marland Kitchen aspirin EC 81 MG tablet Take 81 mg by mouth daily.    Marland Kitchen atorvastatin (LIPITOR) 20 MG tablet Take 20 mg by mouth at bedtime.     . bisoprolol-hydrochlorothiazide (ZIAC) 2.5-6.25 MG per tablet Take 1  tablet by mouth every morning.     . Cholecalciferol (VITAMIN D3) 2000 UNITS TABS Take 2,000 mg by mouth daily.     . Coenzyme Q10 (CO Q10) 100 MG TABS Take 200 mg by mouth every morning.    . DULoxetine (CYMBALTA) 60 MG capsule Take 60 mg by mouth at bedtime.     . Multiple Vitamin (MULTIVITAMIN) tablet Take 1 tablet by mouth daily.      . Omega-3 Fatty Acids (FISH OIL PO) Take 2,400 mg by mouth 2 (two) times daily.     Marland Kitchen omeprazole (PRILOSEC) 20 MG capsule Take 20 mg by mouth daily.    . raloxifene (EVISTA) 60 MG tablet Take 60 mg by mouth every morning.    . tretinoin (RETIN-A) 0.025 % cream Apply 1 application topically at bedtime. Uses on Face only    . doxycycline (VIBRA-TABS) 100 MG tablet Take 100 mg by mouth 2 (two) times daily. Taking for 14 days for infection from surgery    . HYDROcodone-acetaminophen (NORCO) 5-325 MG per tablet Take 1 tablet by mouth every 4 (four) hours as needed for pain. (Patient not taking: Reported on 08/17/2014) 60 tablet 0  . MAGNESIUM PO Take 400 mg by mouth daily.     . meloxicam (MOBIC) 15 MG  tablet Take 15 mg by mouth daily.    . methocarbamol (ROBAXIN) 500 MG tablet Take 1 tablet (500 mg total) by mouth every 6 (six) hours as needed. (Patient not taking: Reported on 08/17/2014) 30 tablet 0   No current facility-administered medications for this visit.   REVIEW OF SYSTEMS: Valu.Nieves ] denotes positive finding; [  ] denotes negative finding  CARDIOVASCULAR:  [ X] chest pain she states that she occasionally has some very brief episodes of chest pain. She does not describe this as chest pressure. This is not associated with any shortness of breath.   [ ]  chest pressure   [ ]  palpitations   [ ]  orthopnea   [ ]  dyspnea on exertion   [ ]  claudication   [ ]  rest pain   [ ]  DVT   [ ]  phlebitis PULMONARY:   [ ]  productive cough   [ ]  asthma   [ ]  wheezing NEUROLOGIC:   [ ]  weakness  [ ]  paresthesias  [ ]  aphasia  [ ]  amaurosis  [ ]  dizziness HEMATOLOGIC:   [ ]  bleeding  problems   [ ]  clotting disorders MUSCULOSKELETAL:  [ ]  joint pain   [ ]  joint swelling [ ]  leg swelling GASTROINTESTINAL: [ ]   blood in stool  [ ]   hematemesis GENITOURINARY:  [ ]   dysuria  [ ]   hematuria PSYCHIATRIC:  [ ]  history of major depression INTEGUMENTARY:  [ ]  rashes  [ ]  ulcers CONSTITUTIONAL:  [ ]  fever   [ ]  chills  PHYSICAL EXAM: Filed Vitals:   08/17/14 1312 08/17/14 1321  BP: 185/67 178/68  Pulse: 66   Height: 5\' 2"  (1.575 m)   Weight: 115 lb (52.164 kg)   SpO2: 99%    Body mass index is 21.03 kg/(m^2). GENERAL: The patient is a well-nourished female, in no acute distress. The vital signs are documented above. CARDIOVASCULAR: There is a regular rate and rhythm. I do not detect carotid bruits. PULMONARY: There is good air exchange bilaterally without wheezing or rales. ABDOMEN: Soft and non-tender with normal pitched bowel sounds.  MUSCULOSKELETAL: There are no major deformities or cyanosis. NEUROLOGIC: No focal weakness or paresthesias are detected. SKIN: There are no ulcers or rashes noted. PSYCHIATRIC: The patient has a normal affect.  DATA:  I have independently interpreted her carotid duplex scan today. She has a 40-59% right carotid stenosis. The velocities on the right have not changed significantly compared to the study in July 2015. On the left side, the velocities have improved somewhat. She is now in the upper end of the 40-59% range whereas she was in the lower end of the 60-79% range in July 2015.  MEDICAL ISSUES: BILATERAL CAROTID STENOSES: The patient has bilateral 40-59% carotid stenoses which are asymptomatic. She is on aspirin. She is on a statin. She is not a smoker. I've recommended a follow up duplex in 6 months and I'll see her back at that time. If she remains in the 40-59% category bilaterally we can probably stretch her follow up out to 1 year.  HYPERTENSION: Her blood pressure was elevated in the office today. It was 185/67. She does take  her blood pressure at home and I have encouraged her to take her blood pressure daily and if this remains elevated to follow up with Dr. Joylene Draft.   Return in about 6 months (around 02/15/2015).   [place] [facility] [place] Beeper: [phone]

## 2014-08-18 NOTE — Addendum Note (Signed)
Addended by: Mena Goes on: 08/18/2014 01:59 PM   Modules accepted: Orders

## 2014-09-28 DIAGNOSIS — M48061 Spinal stenosis, lumbar region without neurogenic claudication: Secondary | ICD-10-CM | POA: Insufficient documentation

## 2014-11-22 DIAGNOSIS — Z96649 Presence of unspecified artificial hip joint: Secondary | ICD-10-CM | POA: Insufficient documentation

## 2015-02-15 ENCOUNTER — Other Ambulatory Visit (HOSPITAL_COMMUNITY): Payer: Self-pay

## 2015-02-15 ENCOUNTER — Ambulatory Visit: Payer: Self-pay | Admitting: Vascular Surgery

## 2015-03-14 ENCOUNTER — Telehealth: Payer: Self-pay

## 2015-03-14 NOTE — Telephone Encounter (Signed)
Spoke to patient received a message from our scheduler you have been having chest pain.Stated she has been having cheat pain off and on this past year.Stated she would like appointment with Dr.Jordan.Last office visit 04/15/2011.No chest pain today.Appointment scheduled with Dr.Jordan 03/20/15 at 9:00 am at [facility] office.Advised to go to ER if needed.

## 2015-03-20 ENCOUNTER — Encounter: Payer: Self-pay | Admitting: Cardiology

## 2015-03-20 ENCOUNTER — Ambulatory Visit (INDEPENDENT_AMBULATORY_CARE_PROVIDER_SITE_OTHER): Payer: Medicare Other | Admitting: Cardiology

## 2015-03-20 VITALS — BP 142/84 | HR 72 | Ht 62.0 in | Wt 113.0 lb

## 2015-03-20 DIAGNOSIS — I779 Disorder of arteries and arterioles, unspecified: Secondary | ICD-10-CM | POA: Diagnosis not present

## 2015-03-20 DIAGNOSIS — E785 Hyperlipidemia, unspecified: Secondary | ICD-10-CM | POA: Diagnosis not present

## 2015-03-20 DIAGNOSIS — R072 Precordial pain: Secondary | ICD-10-CM | POA: Diagnosis not present

## 2015-03-20 DIAGNOSIS — I739 Peripheral vascular disease, unspecified: Secondary | ICD-10-CM

## 2015-03-20 DIAGNOSIS — I10 Essential (primary) hypertension: Secondary | ICD-10-CM

## 2015-03-20 NOTE — Patient Instructions (Signed)
We will schedule you for a nuclear stress test   

## 2015-03-20 NOTE — Progress Notes (Signed)
[name] Date of Birth: [birth year]   History of Present Illness: Mrs. Dino is seen for evaluation of chest pain at the request of Dr. Joylene Draft. She complains of intermittent chest pain that is not typically related to exertion. It resolves  on its own. No SOB or diaphoresis. Pain may be central or right precordial. Has noted it twice while doing PT in the pool.  No dizzyness or lightheadedness. She had similar symptoms in the past with normal stress Echo in 2012 and normal ETT in 2008. She does have a lot of problems with her low back and thinks she may need surgery. Her cardiac risk factors include HTN, HL, and family history of CAD in father. She also has carotid arterial disease.    Current Outpatient Prescriptions on File Prior to Visit  Medication Sig Dispense Refill  . ALPRAZolam (XANAX) 0.5 MG tablet Take 0.25 mg by mouth at bedtime as needed for sleep.     . Ascorbic Acid (VITAMIN C PO) Take 1,000 mg by mouth daily.     Marland Kitchen aspirin EC 81 MG tablet Take 81 mg by mouth daily.    . bisoprolol-hydrochlorothiazide (ZIAC) 2.5-6.25 MG per tablet Take 1 tablet by mouth every morning.     . Cholecalciferol (VITAMIN D3) 2000 UNITS TABS Take 2,000 mg by mouth daily.     . DULoxetine (CYMBALTA) 60 MG capsule Take 60 mg by mouth at bedtime.     . meloxicam (MOBIC) 15 MG tablet Take 15 mg by mouth daily.    . Multiple Vitamin (MULTIVITAMIN) tablet Take 1 tablet by mouth daily.      . Omega-3 Fatty Acids (FISH OIL PO) Take 2,400 mg by mouth 2 (two) times daily.     Marland Kitchen omeprazole (PRILOSEC) 20 MG capsule Take 20 mg by mouth daily.    . raloxifene (EVISTA) 60 MG tablet Take 60 mg by mouth every morning.     No current facility-administered medications on file prior to visit.    No Known Allergies  Past Medical History  Diagnosis Date  . Arthritis   . Hypertension   . Sleep difficulties   . Carotid arterial disease   . Osteopenia   . Hyperlipidemia   . Osteoarthritis   .  Constipation     Past Surgical History  Procedure Laterality Date  . Bunionectomy    . Tonsillectomy and adenoidectomy    . Tendon repair      left hip  . Foot surgery      Status post right foot surgery  . Other surgical history      post fusion of her right thumb  . Pilonidal cyst / sinus excision    . Hand fusion      Right thumb  . Tendon repair      Reattachment of tendon- Left hip  . Total hip arthroplasty Left 09/04/2012    Procedure: LEFT TOTAL HIP ARTHROPLASTY ANTERIOR APPROACH;  Surgeon: Mcarthur Rossetti, MD;  Location: [facility];  Service: Orthopedics;  Laterality: Left;  Left Total Hip Arthroplasty, Anterior Approach  . Joint replacement      History  Smoking status  . Never Smoker   Smokeless tobacco  . Never Used    History  Alcohol Use  . 0.0 oz/week  . 0 Standard drinks or equivalent per week    Comment: 2 gl wine per week    Family History  Problem Relation Age of Onset  . Heart failure Mother   .  Cancer Mother   . Hypertension Mother   . Other Mother     Varicose Veins  . Diabetes Father   . Heart disease Father   . Hypertension Father   . Heart attack Father     Review of Systems: The review of systems is positive for carotid arterial disease. She gets yearly carotid Doppler studies.  All other systems were reviewed and are negative.  Physical Exam: BP 142/84 mmHg  Pulse 72  Ht 5\' 2"  (1.575 m)  Wt 51.256 kg (113 lb)  BMI 20.66 kg/m2 The patient is alert and oriented x 3.  The mood and affect are normal.  The skin is warm and dry.  Color is normal.  The HEENT exam reveals that the sclera are nonicteric.  The mucous membranes are moist.  The carotids are 2+ without bruits.  There is no thyromegaly.  There is no JVD.  The lungs are clear.  The chest wall is non tender.  The heart exam reveals a regular rate with a normal S1 and S2.  There are no murmurs, gallops, or rubs.  The PMI is not displaced.   Abdominal exam reveals good bowel  sounds.  There is no guarding or rebound.  There is no hepatosplenomegaly or tenderness.  There are no masses.  Exam of the legs reveal no clubbing, cyanosis, or edema.  The legs are without rashes.  The distal pulses are intact.  Cranial nerves II - XII are intact.  Motor and sensory functions are intact.  The gait is normal.  LABORATORY DATA: Ecg today shows NSR with rate 72 bpm, normal Ecg. I have personally reviewed and interpreted this study.   Assessment / Plan: 1. Atypical chest pain. Multiple cardiac risk factors. Last evaluation was in 2012 with stress Echo. Recommend Lexiscan myoview now to reassess. She is unable to walk on a treadmill with her back problems.   2. HTN  3. Hyperlipidemia.  4. Lumbar disc disease and scoliosis.   5. Carotid arterial disease moderate- asymptomatic.

## 2015-03-21 ENCOUNTER — Encounter: Payer: Self-pay | Admitting: Vascular Surgery

## 2015-03-22 ENCOUNTER — Encounter: Payer: Self-pay | Admitting: Vascular Surgery

## 2015-03-22 ENCOUNTER — Ambulatory Visit (INDEPENDENT_AMBULATORY_CARE_PROVIDER_SITE_OTHER): Payer: Medicare Other | Admitting: Vascular Surgery

## 2015-03-22 ENCOUNTER — Ambulatory Visit (HOSPITAL_COMMUNITY)
Admission: RE | Admit: 2015-03-22 | Discharge: 2015-03-22 | Disposition: A | Discharge status: Home / Self Care | Payer: Medicare Other | Source: Ambulatory Visit | Attending: Vascular Surgery | Admitting: Vascular Surgery

## 2015-03-22 VITALS — BP 167/68 | HR 64 | Resp 16 | Ht 62.0 in | Wt 114.0 lb

## 2015-03-22 DIAGNOSIS — M79605 Pain in left leg: Secondary | ICD-10-CM

## 2015-03-22 DIAGNOSIS — I6523 Occlusion and stenosis of bilateral carotid arteries: Secondary | ICD-10-CM | POA: Insufficient documentation

## 2015-03-22 DIAGNOSIS — I10 Essential (primary) hypertension: Secondary | ICD-10-CM | POA: Insufficient documentation

## 2015-03-22 DIAGNOSIS — R29898 Other symptoms and signs involving the musculoskeletal system: Secondary | ICD-10-CM

## 2015-03-22 DIAGNOSIS — E785 Hyperlipidemia, unspecified: Secondary | ICD-10-CM | POA: Diagnosis not present

## 2015-03-22 DIAGNOSIS — M79604 Pain in right leg: Secondary | ICD-10-CM | POA: Insufficient documentation

## 2015-03-22 NOTE — Progress Notes (Signed)
[facility]ist of   Patient name: Shannon Berger MRN: [id number] DOB: 07/18/1940 Sex: female  REASON FOR VISIT: Follow up of carotid disease.  HPI: Shannon Berger is a 74 y.o. female who presents for 6 month follow up visit. She has a known 60-79% left carotid stenosis. She is asymptomatic. Since I saw her last, she denies any history of stroke, TIAs, expressive or receptive aphasia, or amaurosis fugax.  Her main complaint today is weakness in both eyes and pain in both eyes which seems to be associated with her back issues. She is being evaluated at [facility] for possible back surgery. She has spinal stenosis, scoliosis, and significant degenerative disc disease of her lumbar spine. I do not get any clear-cut history of claudication, rest pain, or nonhealing ulcers.   Past Medical History  Diagnosis Date  . Arthritis   . Hypertension   . Sleep difficulties   . Carotid arterial disease   . Osteopenia   . Hyperlipidemia   . Osteoarthritis   . Constipation    Family History  Problem Relation Age of Onset  . Heart failure Mother   . Cancer Mother   . Hypertension Mother   . Other Mother     Varicose Veins  . Diabetes Father   . Heart disease Father   . Hypertension Father   . Heart attack Father    SOCIAL HISTORY: Social History  Substance Use Topics  . Smoking status: Never Smoker   . Smokeless tobacco: Never Used  . Alcohol Use: 0.0 oz/week    0 Standard drinks or equivalent per week     Comment: 2 gl wine per week   No Known Allergies Current Outpatient Prescriptions  Medication Sig Dispense Refill  . ALPRAZolam (XANAX) 0.5 MG tablet Take 0.25 mg by mouth at bedtime as needed for sleep.     . Ascorbic Acid (VITAMIN C PO) Take 1,000 mg by mouth daily.     Marland Kitchen aspirin EC 81 MG tablet Take 81 mg by mouth daily.    Marland Kitchen atorvastatin (LIPITOR) 40 MG tablet Take 40 mg by mouth daily.  3  . bisoprolol-hydrochlorothiazide (ZIAC)  2.5-6.25 MG per tablet Take 1 tablet by mouth every morning.     Marland Kitchen buPROPion (WELLBUTRIN XL) 150 MG 24 hr tablet Take 150 mg by mouth daily.  3  . calcium carbonate (TUMS - DOSED IN MG ELEMENTAL CALCIUM) 500 MG chewable tablet Chew 1 tablet by mouth daily.    . Calcium Polycarbophil (FIBERCON PO) Take by mouth. Take one am and two pm    . Cholecalciferol (VITAMIN D3) 2000 UNITS TABS Take 2,000 mg by mouth daily.     Marland Kitchen CINNAMON PO Take 1,000 mg by mouth daily.    . DULoxetine (CYMBALTA) 60 MG capsule Take 60 mg by mouth at bedtime.     . meloxicam (MOBIC) 15 MG tablet Take 15 mg by mouth daily.    . Multiple Vitamin (MULTIVITAMIN) tablet Take 1 tablet by mouth daily.      . Omega-3 Fatty Acids (FISH OIL PO) Take 2,400 mg by mouth 2 (two) times daily.     Marland Kitchen omeprazole (PRILOSEC) 20 MG capsule Take 20 mg by mouth daily.    . raloxifene (EVISTA) 60 MG tablet Take 60 mg by mouth every morning.     No current facility-administered medications for this visit.   REVIEW OF SYSTEMS: Valu.Nieves ] denotes positive finding; [  ] denotes negative finding  CARDIOVASCULAR:  [ ]  chest pain   [ ]  chest pressure   [ ]  palpitations   [ ]  orthopnea   [ ]  dyspnea on exertion   Valu.Nieves ] claudication   [ ]  rest pain   [ ]  DVT   [ ]  phlebitis PULMONARY:   [ ]  productive cough   [ ]  asthma   [ ]  wheezing NEUROLOGIC:   Valu.Nieves ] weakness  [ ]  paresthesias  [ ]  aphasia  [ ]  amaurosis  [ ]  dizziness HEMATOLOGIC:   [ ]  bleeding problems   [ ]  clotting disorders MUSCULOSKELETAL:  Valu.Nieves ] joint pain   [ ]  joint swelling [ ]  leg swelling GASTROINTESTINAL: [ ]   blood in stool  [ ]   hematemesis GENITOURINARY:  [ ]   dysuria  [ ]   hematuria PSYCHIATRIC:  [ ]  history of major depression INTEGUMENTARY:  [ ]  rashes  [ ]  ulcers CONSTITUTIONAL:  [ ]  fever   [ ]  chills  PHYSICAL EXAM: Filed Vitals:   03/22/15 1431 03/22/15 1434 03/22/15 1443 03/22/15 1445  BP: 162/63 157/62 151/73 167/68  Pulse: 64 65 65 64  Resp:  16    Height:  5\' 2"  (1.575  m)    Weight:  114 lb (51.71 kg)    SpO2:  100%     GENERAL: The patient is a well-nourished female, in no acute distress. The vital signs are documented above. CARDIAC: There is a regular rate and rhythm.  VASCULAR: I do not detect carotid bruits. On the right side, I can palpate a femoral, popliteal, and posterior tibial pulse. There is a biphasic posterior tibial signal with a Doppler and a monophasic dorsalis pedis signal. On the left side, I can palpate a femoral, popliteal, and posterior tibial pulse. There is a biphasic posterior tibial signal with the Doppler. I cannot obtain a dorsalis pedis signal although there is a monophasic anterior tibial signal. PULMONARY: There is good air exchange bilaterally without wheezing or rales. ABDOMEN: Soft and non-tender with normal pitched bowel sounds.  MUSCULOSKELETAL: There are no major deformities or cyanosis. NEUROLOGIC: No focal weakness or paresthesias are detected. SKIN: There are no ulcers or rashes noted. PSYCHIATRIC: The patient has a normal affect.  DATA:  I have independently interpreted her carotid duplex scan which shows a 60-79% left carotid stenosis with a 40-59% right carotid stenosis. The velocities on the left have increased slightly Howard stenosis is clearly still less than 80%.  HYPERTENSION: The patient's initial blood pressure today was elevated. We repeated this and this was still elevated. We have encouraged the patient to follow up with their primary care physician for management of their blood pressure.   MEDICAL ISSUES:  BILATERAL CAROTID STENOSES: The patient has an asymptomatic 60-79% left carotid stenosis and a 40-59% right carotid stenosis. She understands we would not consider left carotid endarterectomy unless the stenosis progressed to greater than 80% or she develops new neurologic symptoms. I will order a follow up carotid duplex scan in 6 months and I'll see her back at that time. She is on aspirin. She is  on a statin. She is not a smoker.  BILATERAL LOWER EXTREMITY PAIN AND WEAKNESS: she has palpable pedal pulses and biphasic signals in both feet. I think her pain and weakness in her legs is related to her back problems.  [name] [place]: [phone]

## 2015-03-22 NOTE — Progress Notes (Signed)
Filed Vitals:   03/22/15 1431 03/22/15 1434 03/22/15 1443 03/22/15 1445  BP: 162/63 157/62 151/73 167/68  Pulse: 64 65 65 64  Resp:  16    Height:  5\' 2"  (1.575 m)    Weight:  114 lb (51.71 kg)    SpO2:  100%

## 2015-03-23 ENCOUNTER — Encounter: Payer: Self-pay | Admitting: Podiatry

## 2015-03-23 ENCOUNTER — Telehealth (HOSPITAL_COMMUNITY): Payer: Self-pay

## 2015-03-23 ENCOUNTER — Ambulatory Visit (INDEPENDENT_AMBULATORY_CARE_PROVIDER_SITE_OTHER): Payer: Medicare Other | Admitting: Podiatry

## 2015-03-23 VITALS — BP 133/74 | HR 66 | Temp 97.9°F | Resp 12

## 2015-03-23 DIAGNOSIS — Q828 Other specified congenital malformations of skin: Secondary | ICD-10-CM

## 2015-03-23 DIAGNOSIS — Q667 Congenital pes cavus: Secondary | ICD-10-CM | POA: Diagnosis not present

## 2015-03-23 DIAGNOSIS — M216X9 Other acquired deformities of unspecified foot: Secondary | ICD-10-CM

## 2015-03-23 NOTE — Progress Notes (Signed)
   Subjective:    Patient ID: Shannon Berger, female    DOB: Nov 11, 1940, 74 y.o.   MRN: [id number]  HPI  Patient presents here today with B/L areas on the ball of feet with the right foot the worse , can hardly walk. The patient said that the areas have been there for several months and she has been treating them with corn pads which did not help. This patient presents to office saying she has developed painful calluses which have been painful for months.  She has applied medicated pads in an effort to eliminate the callus.  Her calluses right foot are worse.  She presents for evaluation and treatment. Review of Systems  All other systems reviewed and are negative.      Objective:   Physical Exam GENERAL APPEARANCE: Alert, conversant. Appropriately groomed. No acute distress.  VASCULAR: Pedal pulses palpable at  [facility] and PT bilateral.  Capillary refill time is immediate to all digits,  Normal temperature gradient.  Digital hair growth is present bilateral  NEUROLOGIC: sensation is normal to 5.07 monofilament at 5/5 sites bilateral.  Light touch is intact bilateral, Muscle strength normal.  MUSCULOSKELETAL: acceptable muscle strength, tone and stability bilateral.  Intrinsic muscluature intact bilateral.  Rectus appearance of foot and digits noted bilateral. Severe plantarflexion fifth metatarsal head right foot.  DERMATOLOGIC: skin color, texture, and turgor are within normal limits.  No preulcerative lesions or ulcers  are seen, no interdigital maceration noted.  No open lesions present.  Digital nails are asymptomatic. No drainage noted. Severe callus noted sub 5 th metatarsal head right foot.  Porokeratosis sub 1 B/L        Assessment & Plan:  Porokeratosis B/L   Plantarflexed fifth metatarsal right foot  IE  Debrided porokeratosis.  Discussed conservative vs. Surgical care.   Recommended 3/4 spenco orthotic to be worn.  RTC  Prn.

## 2015-03-23 NOTE — Telephone Encounter (Signed)
Encounter complete. 

## 2015-03-24 ENCOUNTER — Ambulatory Visit: Payer: Medicare Other | Admitting: Podiatry

## 2015-03-24 ENCOUNTER — Ambulatory Visit (HOSPITAL_COMMUNITY)
Admission: RE | Admit: 2015-03-24 | Discharge: 2015-03-24 | Disposition: A | Discharge status: Home / Self Care | Payer: Medicare Other | Source: Ambulatory Visit | Attending: Cardiovascular Disease | Admitting: Cardiovascular Disease

## 2015-03-24 DIAGNOSIS — R002 Palpitations: Secondary | ICD-10-CM | POA: Diagnosis not present

## 2015-03-24 DIAGNOSIS — I10 Essential (primary) hypertension: Secondary | ICD-10-CM

## 2015-03-24 DIAGNOSIS — I739 Peripheral vascular disease, unspecified: Secondary | ICD-10-CM

## 2015-03-24 DIAGNOSIS — I493 Ventricular premature depolarization: Secondary | ICD-10-CM | POA: Insufficient documentation

## 2015-03-24 DIAGNOSIS — Z8249 Family history of ischemic heart disease and other diseases of the circulatory system: Secondary | ICD-10-CM | POA: Insufficient documentation

## 2015-03-24 DIAGNOSIS — I779 Disorder of arteries and arterioles, unspecified: Secondary | ICD-10-CM

## 2015-03-24 DIAGNOSIS — E785 Hyperlipidemia, unspecified: Secondary | ICD-10-CM | POA: Insufficient documentation

## 2015-03-24 DIAGNOSIS — R072 Precordial pain: Secondary | ICD-10-CM | POA: Diagnosis not present

## 2015-03-24 LAB — MYOCARDIAL PERFUSION IMAGING
LV dias vol: 60 mL
LV sys vol: 19 mL
Peak HR: 82 {beats}/min
Rest HR: 64 {beats}/min
SDS: 0
SRS: 0
SSS: 0
TID: 1.12

## 2015-03-24 MED ORDER — TECHNETIUM TC 99M SESTAMIBI GENERIC - CARDIOLITE
30.5000 | Freq: Once | INTRAVENOUS | Status: AC | PRN
  Administered 2015-03-24: 30.5 via INTRAVENOUS

## 2015-03-24 MED ORDER — TECHNETIUM TC 99M SESTAMIBI GENERIC - CARDIOLITE
11.0000 | Freq: Once | INTRAVENOUS | Status: AC | PRN
  Administered 2015-03-24: 11 via INTRAVENOUS

## 2015-03-24 MED ORDER — REGADENOSON 0.4 MG/5ML IV SOLN
0.4000 mg | Freq: Once | INTRAVENOUS | Status: AC
  Administered 2015-03-24: 0.4 mg via INTRAVENOUS

## 2015-03-24 NOTE — Addendum Note (Signed)
Addended by: Dorthula Rue L on: 03/24/2015 03:03 PM   Modules accepted: Orders

## 2015-04-13 ENCOUNTER — Ambulatory Visit: Payer: Self-pay | Admitting: Cardiology

## 2015-05-02 DIAGNOSIS — M4327 Fusion of spine, lumbosacral region: Secondary | ICD-10-CM | POA: Insufficient documentation

## 2015-05-05 LAB — BASIC METABOLIC PANEL
BUN: 8 mg/dL (ref 4–21)
Creatinine: 0.4 mg/dL — AB (ref 0.5–1.1)
Glucose: 126 mg/dL

## 2015-08-09 DIAGNOSIS — M4806 Spinal stenosis, lumbar region: Secondary | ICD-10-CM | POA: Diagnosis not present

## 2015-08-23 DIAGNOSIS — M4806 Spinal stenosis, lumbar region: Secondary | ICD-10-CM | POA: Diagnosis not present

## 2015-09-01 DIAGNOSIS — M4806 Spinal stenosis, lumbar region: Secondary | ICD-10-CM | POA: Diagnosis not present

## 2015-09-01 DIAGNOSIS — Z7982 Long term (current) use of aspirin: Secondary | ICD-10-CM | POA: Diagnosis not present

## 2015-09-01 DIAGNOSIS — R202 Paresthesia of skin: Secondary | ICD-10-CM | POA: Diagnosis not present

## 2015-09-01 DIAGNOSIS — Z79899 Other long term (current) drug therapy: Secondary | ICD-10-CM | POA: Diagnosis not present

## 2015-09-01 DIAGNOSIS — M545 Low back pain: Secondary | ICD-10-CM | POA: Diagnosis not present

## 2015-09-01 DIAGNOSIS — M25461 Effusion, right knee: Secondary | ICD-10-CM | POA: Diagnosis not present

## 2015-09-01 DIAGNOSIS — R200 Anesthesia of skin: Secondary | ICD-10-CM | POA: Diagnosis not present

## 2015-09-01 DIAGNOSIS — Z981 Arthrodesis status: Secondary | ICD-10-CM | POA: Diagnosis not present

## 2015-09-11 DIAGNOSIS — M25571 Pain in right ankle and joints of right foot: Secondary | ICD-10-CM | POA: Diagnosis not present

## 2015-09-11 DIAGNOSIS — M25572 Pain in left ankle and joints of left foot: Secondary | ICD-10-CM | POA: Diagnosis not present

## 2015-09-13 DIAGNOSIS — L84 Corns and callosities: Secondary | ICD-10-CM | POA: Diagnosis not present

## 2015-09-20 ENCOUNTER — Encounter: Payer: Self-pay | Admitting: Vascular Surgery

## 2015-09-27 ENCOUNTER — Ambulatory Visit (HOSPITAL_COMMUNITY)
Admission: RE | Admit: 2015-09-27 | Discharge: 2015-09-27 | Disposition: A | Discharge status: Home / Self Care | Payer: Medicare Other | Source: Ambulatory Visit | Attending: Vascular Surgery | Admitting: Vascular Surgery

## 2015-09-27 ENCOUNTER — Encounter: Payer: Self-pay | Admitting: Vascular Surgery

## 2015-09-27 ENCOUNTER — Ambulatory Visit (INDEPENDENT_AMBULATORY_CARE_PROVIDER_SITE_OTHER): Payer: Medicare Other | Admitting: Vascular Surgery

## 2015-09-27 VITALS — BP 142/73 | HR 55 | Ht 62.0 in | Wt 111.6 lb

## 2015-09-27 DIAGNOSIS — I6523 Occlusion and stenosis of bilateral carotid arteries: Secondary | ICD-10-CM

## 2015-09-27 DIAGNOSIS — M79605 Pain in left leg: Secondary | ICD-10-CM | POA: Diagnosis not present

## 2015-09-27 DIAGNOSIS — I6529 Occlusion and stenosis of unspecified carotid artery: Secondary | ICD-10-CM

## 2015-09-27 DIAGNOSIS — I10 Essential (primary) hypertension: Secondary | ICD-10-CM | POA: Insufficient documentation

## 2015-09-27 DIAGNOSIS — M79604 Pain in right leg: Secondary | ICD-10-CM | POA: Insufficient documentation

## 2015-09-27 DIAGNOSIS — E785 Hyperlipidemia, unspecified: Secondary | ICD-10-CM | POA: Insufficient documentation

## 2015-09-27 DIAGNOSIS — R29898 Other symptoms and signs involving the musculoskeletal system: Secondary | ICD-10-CM

## 2015-09-27 NOTE — Progress Notes (Signed)
[facility] [place]  Patient name: Shannon Berger MRN: [id number] DOB: February 26, 1941 Sex: female  REASON FOR VISIT: Follow up of carotid disease.  HPI: Shannon Berger is a 75 y.o. female who I last saw on 03/22/2015. Patient had a known 60-79% left carotid stenosis. The patient also had a 40-59% right carotid stenosis. She was asymptomatic. Comes in for a 6 month follow up visit. Since I saw her last she has undergone some fairly extensive back surgery at [facility]. She is making progress from that standpoint. She denies any history of stroke, TIAs, expressive or receptive aphasia, or amaurosis fugax.  She is on aspirin and is on a statin. Her blood pressures been under good control. She is not a smoker.  Past Medical History  Diagnosis Date  . Arthritis   . Hypertension   . Sleep difficulties   . Carotid arterial disease ([place])   . Osteopenia   . Hyperlipidemia   . Osteoarthritis   . Constipation     Family History  Problem Relation Age of Onset  . Heart failure Mother   . Cancer Mother   . Hypertension Mother   . Other Mother     Varicose Veins  . Diabetes Father   . Heart disease Father   . Hypertension Father   . Heart attack Father     SOCIAL HISTORY: Social History  Substance Use Topics  . Smoking status: Never Smoker   . Smokeless tobacco: Never Used  . Alcohol Use: 0.0 oz/week    0 Standard drinks or equivalent per week     Comment: 2 gl wine per week    No Known Allergies  Current Outpatient Prescriptions  Medication Sig Dispense Refill  . ALPRAZolam (XANAX) 0.5 MG tablet Take 0.25 mg by mouth at bedtime as needed for sleep.     . Ascorbic Acid (VITAMIN C PO) Take 1,000 mg by mouth daily.     Marland Kitchen aspirin EC 81 MG tablet Take 81 mg by mouth daily.    Marland Kitchen atorvastatin (LIPITOR) 40 MG tablet Take 40 mg by mouth daily.  3  . bisoprolol-hydrochlorothiazide (ZIAC) 2.5-6.25 MG per tablet Take 1 tablet by mouth every morning.     Marland Kitchen  buPROPion (WELLBUTRIN XL) 150 MG 24 hr tablet Take 150 mg by mouth daily.  3  . calcium carbonate (TUMS - DOSED IN MG ELEMENTAL CALCIUM) 500 MG chewable tablet Chew 1 tablet by mouth daily. Reported on 09/27/2015    . Calcium Polycarbophil (FIBERCON PO) Take by mouth. Reported on 09/27/2015    . Cholecalciferol (VITAMIN D3) 2000 UNITS TABS Take 2,000 mg by mouth daily.     Marland Kitchen CINNAMON PO Take 1,000 mg by mouth daily. Reported on 09/27/2015    . DULoxetine (CYMBALTA) 60 MG capsule Take 60 mg by mouth at bedtime.     . meloxicam (MOBIC) 15 MG tablet Take 15 mg by mouth daily.    . Multiple Vitamin (MULTIVITAMIN) tablet Take 1 tablet by mouth daily.      . Omega-3 Fatty Acids (FISH OIL PO) Take 2,400 mg by mouth 2 (two) times daily.     Marland Kitchen omeprazole (PRILOSEC) 20 MG capsule Take 20 mg by mouth daily.    . raloxifene (EVISTA) 60 MG tablet Take 60 mg by mouth every morning.     No current facility-administered medications for this visit.    REVIEW OF SYSTEMS:  [X]  denotes positive finding, [ ]  denotes negative finding Cardiac  Comments:  Chest pain or chest pressure:    Shortness of breath upon exertion:    Short of breath when lying flat:    Irregular heart rhythm:        Vascular    Pain in calf, thigh, or hip brought on by ambulation:    Pain in feet at night that wakes you up from your sleep:     Blood clot in your veins:    Leg swelling:         Pulmonary    Oxygen at home:    Productive cough:     Wheezing:         Neurologic    Sudden weakness in arms or legs:     Sudden numbness in arms or legs:     Sudden onset of difficulty speaking or slurred speech:    Temporary loss of vision in one eye:     Problems with dizziness:         Gastrointestinal    Blood in stool:     Vomited blood:         Genitourinary    Burning when urinating:     Blood in urine:        Psychiatric    Major depression:         Hematologic    Bleeding problems:    Problems with blood clotting  too easily:        Skin    Rashes or ulcers:        Constitutional    Fever or chills:      PHYSICAL EXAM: Filed Vitals:   09/27/15 1007 09/27/15 1022  BP: 156/69 142/73  Pulse: 55   Height: 5\' 2"  (1.575 m)   Weight: 111 lb 9.6 oz (50.621 kg)   SpO2: 100%     GENERAL: The patient is a well-nourished female, in no acute distress. The vital signs are documented above. CARDIAC: There is a regular rate and rhythm.  VASCULAR: I do not detect carotid bruits. PULMONARY: There is good air exchange bilaterally without wheezing or rales. ABDOMEN: Soft and non-tender with normal pitched bowel sounds.  MUSCULOSKELETAL: There are no major deformities or cyanosis. NEUROLOGIC: No focal weakness or paresthesias are detected. SKIN: There are no ulcers or rashes noted. PSYCHIATRIC: The patient has a normal affect.  DATA:   CAROTID DUPLEX: I have independently interpreted her carotid duplex scan. She has a 60-79% left carotid stenosis in the mid range. This is not changed significantly. On the right side she has a 40-59% stenosis.  MEDICAL ISSUES:  BILATERAL CAROTID STENOSES: The patient has asymptomatic carotid disease. She has a 60-79% left carotid stenosis and a 40-59% right carotid stenosis. She understands we would not consider carotid endarterectomy was the stenosis progressed to greater than 80% or she develop new neurologic symptoms. I ordered a follow up carotid duplex can in 6 months and I'll see her back at that time. She knows to call sooner she has problems. In the meantime she knows to continue taking her aspirin and statin.  Shannon Berger [facility] [place]: [phone]

## 2015-09-28 NOTE — Addendum Note (Signed)
Addended by: Thresa Ross C on: 09/28/2015 11:55 AM   Modules accepted: Orders

## 2015-10-11 DIAGNOSIS — M4806 Spinal stenosis, lumbar region: Secondary | ICD-10-CM | POA: Diagnosis not present

## 2015-10-11 DIAGNOSIS — M47816 Spondylosis without myelopathy or radiculopathy, lumbar region: Secondary | ICD-10-CM | POA: Diagnosis not present

## 2015-10-11 DIAGNOSIS — M4327 Fusion of spine, lumbosacral region: Secondary | ICD-10-CM | POA: Diagnosis not present

## 2015-11-01 DIAGNOSIS — M4806 Spinal stenosis, lumbar region: Secondary | ICD-10-CM | POA: Diagnosis not present

## 2015-11-01 DIAGNOSIS — M5412 Radiculopathy, cervical region: Secondary | ICD-10-CM | POA: Diagnosis not present

## 2015-11-08 DIAGNOSIS — M4806 Spinal stenosis, lumbar region: Secondary | ICD-10-CM | POA: Diagnosis not present

## 2015-11-08 LAB — BASIC METABOLIC PANEL
Potassium: 4.1 mmol/L (ref 3.4–5.3)
Sodium: 139 mmol/L (ref 137–147)

## 2015-11-08 LAB — CBC AND DIFFERENTIAL
HCT: 37 % (ref 36–46)
Hemoglobin: 12.4 g/dL (ref 12.0–16.0)
Platelets: 368 10*3/uL (ref 150–399)
WBC: 5.2 10^3/mL

## 2015-11-14 DIAGNOSIS — R278 Other lack of coordination: Secondary | ICD-10-CM | POA: Diagnosis not present

## 2015-11-14 DIAGNOSIS — Z96642 Presence of left artificial hip joint: Secondary | ICD-10-CM | POA: Diagnosis not present

## 2015-11-14 DIAGNOSIS — M419 Scoliosis, unspecified: Secondary | ICD-10-CM | POA: Diagnosis not present

## 2015-11-14 DIAGNOSIS — M4806 Spinal stenosis, lumbar region: Secondary | ICD-10-CM | POA: Diagnosis not present

## 2015-11-14 DIAGNOSIS — M9973 Connective tissue and disc stenosis of intervertebral foramina of lumbar region: Secondary | ICD-10-CM | POA: Diagnosis not present

## 2015-11-14 DIAGNOSIS — R2681 Unsteadiness on feet: Secondary | ICD-10-CM | POA: Diagnosis not present

## 2015-11-14 DIAGNOSIS — M5136 Other intervertebral disc degeneration, lumbar region: Secondary | ICD-10-CM | POA: Diagnosis not present

## 2015-11-14 DIAGNOSIS — I10 Essential (primary) hypertension: Secondary | ICD-10-CM | POA: Diagnosis not present

## 2015-11-14 DIAGNOSIS — M6281 Muscle weakness (generalized): Secondary | ICD-10-CM | POA: Diagnosis not present

## 2015-11-14 DIAGNOSIS — Z981 Arthrodesis status: Secondary | ICD-10-CM | POA: Diagnosis not present

## 2015-11-14 DIAGNOSIS — Z5189 Encounter for other specified aftercare: Secondary | ICD-10-CM | POA: Diagnosis not present

## 2015-11-14 DIAGNOSIS — M545 Low back pain: Secondary | ICD-10-CM | POA: Diagnosis not present

## 2015-11-14 DIAGNOSIS — Z79899 Other long term (current) drug therapy: Secondary | ICD-10-CM | POA: Diagnosis not present

## 2015-11-15 DIAGNOSIS — M419 Scoliosis, unspecified: Secondary | ICD-10-CM | POA: Diagnosis not present

## 2015-11-15 DIAGNOSIS — M4806 Spinal stenosis, lumbar region: Secondary | ICD-10-CM | POA: Diagnosis not present

## 2015-11-16 DIAGNOSIS — M4806 Spinal stenosis, lumbar region: Secondary | ICD-10-CM | POA: Diagnosis not present

## 2015-11-21 ENCOUNTER — Encounter: Payer: Self-pay | Admitting: *Deleted

## 2015-11-21 DIAGNOSIS — K5901 Slow transit constipation: Secondary | ICD-10-CM | POA: Diagnosis not present

## 2015-11-21 DIAGNOSIS — M79604 Pain in right leg: Secondary | ICD-10-CM | POA: Diagnosis not present

## 2015-11-21 DIAGNOSIS — R278 Other lack of coordination: Secondary | ICD-10-CM | POA: Diagnosis not present

## 2015-11-21 DIAGNOSIS — F329 Major depressive disorder, single episode, unspecified: Secondary | ICD-10-CM | POA: Diagnosis not present

## 2015-11-21 DIAGNOSIS — K219 Gastro-esophageal reflux disease without esophagitis: Secondary | ICD-10-CM | POA: Diagnosis not present

## 2015-11-21 DIAGNOSIS — Z981 Arthrodesis status: Secondary | ICD-10-CM | POA: Diagnosis not present

## 2015-11-21 DIAGNOSIS — M545 Low back pain: Secondary | ICD-10-CM | POA: Diagnosis not present

## 2015-11-21 DIAGNOSIS — M6281 Muscle weakness (generalized): Secondary | ICD-10-CM | POA: Diagnosis not present

## 2015-11-21 DIAGNOSIS — M6249 Contracture of muscle, multiple sites: Secondary | ICD-10-CM | POA: Diagnosis not present

## 2015-11-21 DIAGNOSIS — F419 Anxiety disorder, unspecified: Secondary | ICD-10-CM | POA: Diagnosis not present

## 2015-11-21 DIAGNOSIS — M4806 Spinal stenosis, lumbar region: Secondary | ICD-10-CM | POA: Diagnosis not present

## 2015-11-21 DIAGNOSIS — M4325 Fusion of spine, thoracolumbar region: Secondary | ICD-10-CM | POA: Diagnosis not present

## 2015-11-21 DIAGNOSIS — E785 Hyperlipidemia, unspecified: Secondary | ICD-10-CM | POA: Diagnosis not present

## 2015-11-21 DIAGNOSIS — I10 Essential (primary) hypertension: Secondary | ICD-10-CM | POA: Diagnosis not present

## 2015-11-21 DIAGNOSIS — G8929 Other chronic pain: Secondary | ICD-10-CM | POA: Diagnosis not present

## 2015-11-21 DIAGNOSIS — R2681 Unsteadiness on feet: Secondary | ICD-10-CM | POA: Diagnosis not present

## 2015-11-21 DIAGNOSIS — Z5189 Encounter for other specified aftercare: Secondary | ICD-10-CM | POA: Diagnosis not present

## 2015-11-21 DIAGNOSIS — R410 Disorientation, unspecified: Secondary | ICD-10-CM | POA: Diagnosis not present

## 2015-11-21 DIAGNOSIS — G4700 Insomnia, unspecified: Secondary | ICD-10-CM | POA: Diagnosis not present

## 2015-11-21 DIAGNOSIS — D508 Other iron deficiency anemias: Secondary | ICD-10-CM | POA: Diagnosis not present

## 2015-11-21 DIAGNOSIS — R262 Difficulty in walking, not elsewhere classified: Secondary | ICD-10-CM | POA: Diagnosis not present

## 2015-11-21 NOTE — Telephone Encounter (Signed)
This encounter was created in error - please disregard.

## 2015-11-22 ENCOUNTER — Encounter: Payer: Self-pay | Admitting: Adult Health

## 2015-11-22 ENCOUNTER — Non-Acute Institutional Stay (SKILLED_NURSING_FACILITY): Payer: Medicare Other | Admitting: Adult Health

## 2015-11-22 DIAGNOSIS — F419 Anxiety disorder, unspecified: Secondary | ICD-10-CM

## 2015-11-22 DIAGNOSIS — I10 Essential (primary) hypertension: Secondary | ICD-10-CM

## 2015-11-22 DIAGNOSIS — K219 Gastro-esophageal reflux disease without esophagitis: Secondary | ICD-10-CM

## 2015-11-22 DIAGNOSIS — M4806 Spinal stenosis, lumbar region: Secondary | ICD-10-CM | POA: Diagnosis not present

## 2015-11-22 DIAGNOSIS — M4325 Fusion of spine, thoracolumbar region: Secondary | ICD-10-CM | POA: Diagnosis not present

## 2015-11-22 DIAGNOSIS — M6249 Contracture of muscle, multiple sites: Secondary | ICD-10-CM | POA: Diagnosis not present

## 2015-11-22 DIAGNOSIS — R2681 Unsteadiness on feet: Secondary | ICD-10-CM | POA: Diagnosis not present

## 2015-11-22 DIAGNOSIS — G8929 Other chronic pain: Secondary | ICD-10-CM | POA: Diagnosis not present

## 2015-11-22 DIAGNOSIS — M48061 Spinal stenosis, lumbar region without neurogenic claudication: Secondary | ICD-10-CM | POA: Insufficient documentation

## 2015-11-22 DIAGNOSIS — K5900 Constipation, unspecified: Secondary | ICD-10-CM | POA: Insufficient documentation

## 2015-11-22 DIAGNOSIS — E785 Hyperlipidemia, unspecified: Secondary | ICD-10-CM

## 2015-11-22 DIAGNOSIS — M79604 Pain in right leg: Secondary | ICD-10-CM | POA: Diagnosis not present

## 2015-11-22 DIAGNOSIS — F32A Depression, unspecified: Secondary | ICD-10-CM

## 2015-11-22 DIAGNOSIS — R262 Difficulty in walking, not elsewhere classified: Secondary | ICD-10-CM | POA: Diagnosis not present

## 2015-11-22 DIAGNOSIS — M545 Low back pain: Secondary | ICD-10-CM | POA: Diagnosis not present

## 2015-11-22 DIAGNOSIS — Z5189 Encounter for other specified aftercare: Secondary | ICD-10-CM | POA: Diagnosis not present

## 2015-11-22 DIAGNOSIS — K5901 Slow transit constipation: Secondary | ICD-10-CM

## 2015-11-22 DIAGNOSIS — F329 Major depressive disorder, single episode, unspecified: Secondary | ICD-10-CM | POA: Diagnosis not present

## 2015-11-22 NOTE — Progress Notes (Signed)
Patient ID: Shannon Berger, female   DOB: [birth year], 75 y.o.   MRN: [id number]    DATE:  11/22/15  MRN:  [id number]  BIRTHDAY: Mar 11, 1941  Facility:  Nursing Home Location:  [place] Room Number: 1205-P  LEVEL OF CARE:  SNF (31)  Contact Information    Name Relation Home Work [place] [phone]  [phone]       Code Status History    Date Active Date Inactive Code Status Order ID Comments User Context   09/04/2012 12:05 PM 09/07/2012  1:49 PM Full Code [id number]  Mcarthur Rossetti, MD Inpatient       Chief Complaint  Patient presents with  . Hospitalization Follow-up    HISTORY OF PRESENT ILLNESS:  This is a 75 year old female who has been admitted to [facility] on 11/21/15 from [facility] with Lumbar Stenosis S/P anterior/posterior lumbar fusion. She has been admitted for a short-term rehabilitation. She complains of 7/10 pain on her back. She has complained that she has not had bowel movement for several days. Husband is at bedside. Patient has requested for pain medications to be scheduled.  PAST MEDICAL HISTORY:  Past Medical History  Diagnosis Date  . Arthritis   . Hypertension   . Sleep difficulties   . Carotid arterial disease ([place])   . Osteopenia   . Hyperlipidemia   . Osteoarthritis   . Constipation   . Degeneration of lumbar intervertebral disc   . Low back pain     Radiating to left leg  . Spinal stenosis at L4-L5 level   . Fusion of spine of lumbar region   . Dislocation of hip joint prosthesis ([facility])      CURRENT MEDICATIONS: Reviewed  Patient's Medications  New Prescriptions   No medications on file  Previous Medications   ALPRAZOLAM (XANAX) 0.25 MG TABLET    Take 0.25 mg by mouth 3 (three) times daily as needed.    ASCORBIC ACID (VITAMIN C PO)    Take 1,000 mg by mouth daily.    ASPIRIN EC 81 MG TABLET    Take 81 mg by mouth daily.   ATORVASTATIN (LIPITOR) 40 MG TABLET     Take 40 mg by mouth daily.   BISOPROLOL-HYDROCHLOROTHIAZIDE (ZIAC) 2.5-6.25 MG PER TABLET    Take 1 tablet by mouth every morning.    BUPROPION (WELLBUTRIN XL) 150 MG 24 HR TABLET    Take 150 mg by mouth daily.   CHOLECALCIFEROL (VITAMIN D3) 2000 UNITS TABS    Take 2,000 mg by mouth daily.    DULOXETINE (CYMBALTA) 60 MG CAPSULE    Take 60 mg by mouth at bedtime.    FENOPROFEN (NALFON) 600 MG TABS TABLET    Take 600 mg by mouth daily.   HYDROCODONE-ACETAMINOPHEN (NORCO) 7.5-325 MG TABLET    Take 1-2 tablets by mouth every 4 (four) hours as needed for moderate pain.   LIDOCAINE (LIDODERM) 5 %    Place 2 patches onto the skin daily. Remove & Discard patch within 12 hours or as directed by MD.  Place on the most painful area.   MELOXICAM (MOBIC) 15 MG TABLET    Take 15 mg by mouth daily.   MULTIPLE VITAMIN (MULTIVITAMIN) TABLET    Take 1 tablet by mouth daily.     OMEGA-3 FATTY ACIDS (FISH OIL PO)    Take 1,000 mg by mouth 2 (two) times daily.    OMEPRAZOLE (PRILOSEC) 20  MG CAPSULE    Take 20 mg by mouth daily.   POLYETHYLENE GLYCOL (MIRALAX / GLYCOLAX) PACKET    Take 17 g by mouth daily. Hold for diarrhea   PREGABALIN (LYRICA) 50 MG CAPSULE    Take 50 mg by mouth daily as needed.   RALOXIFENE (EVISTA) 60 MG TABLET    Take 60 mg by mouth every morning.   SENNOSIDES-DOCUSATE SODIUM (SENOKOT-S) 8.6-50 MG TABLET    Take 1 tablet by mouth daily.   TRETINOIN (RETIN-A) 0.05 % CREAM    Apply 1 application topically at bedtime.  Modified Medications   No medications on file  Discontinued Medications   ALPRAZOLAM (XANAX) 0.5 MG TABLET    Take 0.25 mg by mouth at bedtime as needed for sleep.    CALCIUM CARBONATE (TUMS - DOSED IN MG ELEMENTAL CALCIUM) 500 MG CHEWABLE TABLET    Chew 1 tablet by mouth daily. Reported on 09/27/2015   CALCIUM POLYCARBOPHIL (FIBERCON PO)    Take by mouth. Reported on 09/27/2015   CINNAMON PO    Take 1,000 mg by mouth daily. Reported on 09/27/2015     No Known  Allergies   REVIEW OF SYSTEMS:  GENERAL: no change in appetite, no fatigue, no weight changes, no fever, chills or weakness EYES: Denies change in vision, dry eyes, eye pain, itching or discharge EARS: Denies change in hearing, ringing in ears, or earache NOSE: Denies nasal congestion or epistaxis MOUTH and THROAT: Denies oral discomfort, gingival pain or bleeding, pain from teeth or hoarseness   RESPIRATORY: no cough, SOB, DOE, wheezing, hemoptysis CARDIAC: no chest pain, edema or palpitations GI: no abdominal pain, diarrhea, heart burn, nausea or vomiting, +constipation GU: Denies dysuria, frequency, hematuria, incontinence, or discharge PSYCHIATRIC: Denies feeling of depression or anxiety. No report of hallucinations, insomnia, paranoia, or agitation   PHYSICAL EXAMINATION  GENERAL APPEARANCE: Well nourished. In no acute distress. Normal body habitus SKIN:  Has left and right back  And right lateral anterior and posterior surgical site glued, dr, no erythema HEAD: Normal in size and contour. No evidence of trauma EYES: Lids open and close normally. No blepharitis, entropion or ectropion. PERRL. Conjunctivae are clear and sclerae are white. Lenses are without opacity EARS: Pinnae are normal. Patient hears normal voice tunes of the examiner MOUTH and THROAT: Lips are without lesions. Oral mucosa is moist and without lesions. Tongue is normal in shape, size, and color and without lesions NECK: supple, trachea midline, no neck masses, no thyroid tenderness, no thyromegaly LYMPHATICS: no LAN in the neck, no supraclavicular LAN RESPIRATORY: breathing is even & unlabored, BS CTAB CARDIAC: RRR, no murmur,no extra heart sounds, no edema GI: abdomen soft, normal BS, no masses, no tenderness, no hepatomegaly, no splenomegaly EXTREMITIES:  Able to move X 4 extremities PSYCHIATRIC: Alert and oriented X 3. Affect and behavior are appropriate  LABS/RADIOLOGY: Labs reviewed: Basic Metabolic  Panel:  Recent Labs  05/05/15 11/08/15  NA  --  139  K  --  4.1  BUN 8  --   CREATININE 0.4*  --    CBC:  Recent Labs  11/08/15  WBC 5.2  HGB 12.4  HCT 37  PLT 368     ASSESSMENT/PLAN:  Lumbar stenosis S/P anterior/posterior lumbar fusion - for rehabilitation; schedule Norco 7.5/325 mg 1 tab by mouth every 8 a.m., 12 PM, 4 PM, 8 PM and 12 AM and continue Norco 7.5/325 mg 1 tab by mouth every 4 hours when necessary, Lyrica 50 mg 1  capsule by mouth Q HS when necessary, meloxicam 15 mg 1 tab by mouth Q HS when necessary, lidocaine 5% 2 patches onto skin daily and Cymbalta 60 mg 1 capsule by mouth daily at bedtime for pain; follow-up with neurosurgery @ [facility]; check CBC; Physiatry consultation  Anxiety - mood is stable; patient verbalized that she takes Xanax 0.25 mg 1/2 tab = 0.125 mg by mouth every 8 hours when necessary @ home and will adjust dosage    Constipation - start senna S 2 tabs by mouth twice a day and MiraLAX 17 g by mouth twice a day  Hypertension - continue Ziac 2.5-6.25 mg 1 tab by mouth daily; check BMP  Depression - continue bupropion HCl XL 150 mg 1 tab by mouth daily  Hyperlipidemia - continue atorvastatin 40 mg 1 tab by mouth daily at bedtime  GERD - stable; continue Prilosec 20 mg 1 capsule by mouth daily at bedtime     Goals of care:  Short-term rehabilitation    Durenda Age, NP [place] [phone]

## 2015-11-23 DIAGNOSIS — M4325 Fusion of spine, thoracolumbar region: Secondary | ICD-10-CM | POA: Diagnosis not present

## 2015-11-23 DIAGNOSIS — R2681 Unsteadiness on feet: Secondary | ICD-10-CM | POA: Diagnosis not present

## 2015-11-23 DIAGNOSIS — M6249 Contracture of muscle, multiple sites: Secondary | ICD-10-CM | POA: Diagnosis not present

## 2015-11-23 DIAGNOSIS — Z5189 Encounter for other specified aftercare: Secondary | ICD-10-CM | POA: Diagnosis not present

## 2015-11-23 DIAGNOSIS — M545 Low back pain: Secondary | ICD-10-CM | POA: Diagnosis not present

## 2015-11-23 DIAGNOSIS — G8929 Other chronic pain: Secondary | ICD-10-CM | POA: Diagnosis not present

## 2015-11-23 DIAGNOSIS — M79604 Pain in right leg: Secondary | ICD-10-CM | POA: Diagnosis not present

## 2015-11-23 DIAGNOSIS — D508 Other iron deficiency anemias: Secondary | ICD-10-CM | POA: Diagnosis not present

## 2015-11-23 DIAGNOSIS — R262 Difficulty in walking, not elsewhere classified: Secondary | ICD-10-CM | POA: Diagnosis not present

## 2015-11-23 LAB — BASIC METABOLIC PANEL
BUN: 10 mg/dL (ref 4–21)
Creatinine: 0.5 mg/dL (ref 0.5–1.1)
Glucose: 94 mg/dL
Potassium: 3.5 mmol/L (ref 3.4–5.3)
Sodium: 137 mmol/L (ref 137–147)

## 2015-11-23 LAB — CBC AND DIFFERENTIAL
HCT: 29 % — AB (ref 36–46)
Hemoglobin: 9.5 g/dL — AB (ref 12.0–16.0)
Platelets: 491 10*3/uL — AB (ref 150–399)
WBC: 8.8 10^3/mL

## 2015-11-24 ENCOUNTER — Encounter: Payer: Self-pay | Admitting: Internal Medicine

## 2015-11-24 ENCOUNTER — Non-Acute Institutional Stay (SKILLED_NURSING_FACILITY): Payer: Medicare Other | Admitting: Internal Medicine

## 2015-11-24 DIAGNOSIS — M48061 Spinal stenosis, lumbar region without neurogenic claudication: Secondary | ICD-10-CM

## 2015-11-24 DIAGNOSIS — K219 Gastro-esophageal reflux disease without esophagitis: Secondary | ICD-10-CM | POA: Diagnosis not present

## 2015-11-24 DIAGNOSIS — M4806 Spinal stenosis, lumbar region: Secondary | ICD-10-CM | POA: Diagnosis not present

## 2015-11-24 DIAGNOSIS — M6249 Contracture of muscle, multiple sites: Secondary | ICD-10-CM | POA: Diagnosis not present

## 2015-11-24 DIAGNOSIS — E785 Hyperlipidemia, unspecified: Secondary | ICD-10-CM

## 2015-11-24 DIAGNOSIS — F419 Anxiety disorder, unspecified: Secondary | ICD-10-CM

## 2015-11-24 DIAGNOSIS — F329 Major depressive disorder, single episode, unspecified: Secondary | ICD-10-CM

## 2015-11-24 DIAGNOSIS — F32A Depression, unspecified: Secondary | ICD-10-CM

## 2015-11-24 DIAGNOSIS — M79604 Pain in right leg: Secondary | ICD-10-CM | POA: Diagnosis not present

## 2015-11-24 DIAGNOSIS — I10 Essential (primary) hypertension: Secondary | ICD-10-CM

## 2015-11-24 DIAGNOSIS — M4325 Fusion of spine, thoracolumbar region: Secondary | ICD-10-CM | POA: Diagnosis not present

## 2015-11-24 DIAGNOSIS — M545 Low back pain: Secondary | ICD-10-CM | POA: Diagnosis not present

## 2015-11-24 DIAGNOSIS — R2681 Unsteadiness on feet: Secondary | ICD-10-CM | POA: Diagnosis not present

## 2015-11-24 DIAGNOSIS — R262 Difficulty in walking, not elsewhere classified: Secondary | ICD-10-CM | POA: Diagnosis not present

## 2015-11-24 DIAGNOSIS — K5901 Slow transit constipation: Secondary | ICD-10-CM

## 2015-11-24 DIAGNOSIS — R410 Disorientation, unspecified: Secondary | ICD-10-CM

## 2015-11-24 DIAGNOSIS — Z5189 Encounter for other specified aftercare: Secondary | ICD-10-CM | POA: Diagnosis not present

## 2015-11-24 DIAGNOSIS — G8929 Other chronic pain: Secondary | ICD-10-CM | POA: Diagnosis not present

## 2015-11-24 NOTE — Progress Notes (Signed)
Provider:  Rexene Edison. Mariea Clonts, D.O., C.M.D. Location:  [facility] Room Number: 1205-P Place of Service:  SNF (31)  PCP: Jerlyn Ly, MD Patient Care Team: [name], MD as PCP - General (Internal Medicine)  Extended Emergency Contact Information Primary Emergency Contact: Ranney,Jerry B Address: [address] [place], [place] [address] Johnnette Litter of [place] Phone: [phone] Mobile Phone: [phone] Relation: Spouse  Code Status: Full Code Goals of Care: Advanced Directive information Advanced Directives 09/27/2015  Does patient have an advance directive? Yes  Type of Advance Directive Living will;Healthcare Power of Attorney  Does patient want to make changes to advanced directive? No - Patient declined  Copy of advanced directive(s) in chart? No - copy requested   Chief Complaint  Patient presents with  . New Admit To SNF    New Admission    HPI: Patient is a 75 y.o. female seen today for admission to [facility] s/p anterior/posterior L4-5 lumbar fusion by Dr. Marzetta Board at [facility] due to lumbar spinal stenosis L4-5, degenerative disc disease causing low back pain radiating to her left leg.  She also has a h/o PVCs, carotid stenosis, HTN, hyperlipidemia, depression, hip osteoarthritis, constipation and anxiety.    When seen, she was clearly depressed (is on wellbutrin, cymbalta and xanax).  She was also lethargic and drowsy making it hard to understand her at times.  Her husband then arrived and had concerns that she has been having hallucinations from her medications at night.  She's also been trying to get up on her own.  He has stayed with her a couple of nights, but needs to get rest himself so he has asked someone else to come for tonight.  He reports that she does not tolerate stronger pain medications well.  Monina has already reduced her hydrocodone to try to help with some of this  delirium.  He would like for [place] to get her d (own to tramadol only and tylenol asap b/c of her cognitive altering.  She feels like she will never get better.  This is apparently second of two recent surgeries she's had and the first did not help her.  She does not feel like she's doing what she should be with therapy.  Past Medical History  Diagnosis Date  . Arthritis   . Hypertension   . Sleep difficulties   . Carotid arterial disease ([place])   . Osteopenia   . Hyperlipidemia   . Osteoarthritis   . Constipation   . Degeneration of lumbar intervertebral disc   . Low back pain     Radiating to left leg  . Spinal stenosis at L4-L5 level   . Fusion of spine of lumbar region   . Dislocation of hip joint prosthesis [facility])    Past Surgical History  Procedure Laterality Date  . Bunionectomy    . Tonsillectomy and adenoidectomy    . Tendon repair      left hip  . Foot surgery      Status post right foot surgery  . Other surgical history      post fusion of her right thumb  . Pilonidal cyst / sinus excision    . Hand fusion      Right thumb  . Tendon repair      Reattachment of tendon- Left hip  . Total hip arthroplasty Left 09/04/2012    Procedure: LEFT  TOTAL HIP ARTHROPLASTY ANTERIOR APPROACH;  Surgeon: Mcarthur Rossetti, MD;  Location: [facility];  Service: Orthopedics;  Laterality: Left;  Left Total Hip Arthroplasty, Anterior Approach  . Joint replacement Left 2014    Hip  . Back surgery    . Lumbar fusion      Anterior and posterior    reports that she has never smoked. She has never used smokeless tobacco. She reports that she drinks alcohol. She reports that she does not use illicit drugs. Social History   Social History  . Marital Status: Married    Spouse Name: N/A  . Number of Children: 2  . Years of Education: N/A   Occupational History  . perschool teacher     retired   Social History Main Topics  . Smoking status: Never Smoker   . Smokeless tobacco: Never  Used  . Alcohol Use: 0.0 oz/week    0 Standard drinks or equivalent per week     Comment: 2 gl wine per week  . Drug Use: No  . Sexual Activity: Not on file   Other Topics Concern  . Not on file   Social History Narrative    Functional Status Survey:  no difficulty hearing, seeing, is confused at present, having difficulty walking and doing stairs (using wheelchair), dressing, bathing, errands, prepping food, using toilet; able to eat for herself and has control of bowels and bladder (unless not able to get there due to her back), husband helps with meds, finances, housekeeping now.  Family History  Problem Relation Age of Onset  . Heart failure Mother   . Cancer Mother   . Hypertension Mother   . Other Mother     Varicose Veins  . Diabetes Father   . Heart disease Father   . Hypertension Father   . Heart attack Father     Health Maintenance  Topic Date Due  . MAMMOGRAM  07/08/2017 (Originally 03/03/1991)  . DEXA SCAN  07/08/2017 (Originally 03/02/2006)  . COLONOSCOPY  07/08/2017 (Originally 03/03/1991)  . PNA vac Low Risk Adult (1 of 2 - PCV13) 07/08/2017 (Originally 03/02/2006)  . ZOSTAVAX  11/23/2017 (Originally 03/02/2001)  . TETANUS/TDAP  11/23/2025 (Originally 03/02/1960)  . INFLUENZA VACCINE  02/06/2016    No Known Allergies    Medication List       This list is accurate as of: 11/24/15  4:35 PM.  Always use your most recent med list.               ALPRAZolam 0.25 MG tablet  Commonly known as:  XANAX  Take 0.25 mg by mouth 3 (three) times daily as needed.     aspirin EC 81 MG tablet  Take 81 mg by mouth daily.     atorvastatin 40 MG tablet  Commonly known as:  LIPITOR  Take 40 mg by mouth daily.     bisoprolol-hydrochlorothiazide 2.5-6.25 MG tablet  Commonly known as:  ZIAC  Take 1 tablet by mouth every morning.     buPROPion 150 MG 24 hr tablet  Commonly known as:  WELLBUTRIN XL  Take 150 mg by mouth daily.     DULoxetine 60 MG capsule    Commonly known as:  CYMBALTA  Take 60 mg by mouth at bedtime.     fenoprofen 600 MG Tabs tablet  Commonly known as:  NALFON  Take 600 mg by mouth daily.     FISH OIL PO  Take 1,000 mg by mouth 2 (two) times daily.  HYDROcodone-acetaminophen 7.5-325 MG tablet  Commonly known as:  NORCO  Take 1-2 tablets by mouth every 4 (four) hours as needed for moderate pain.     lidocaine 5 %  Commonly known as:  LIDODERM  Place 2 patches onto the skin daily. Remove & Discard patch within 12 hours or as directed by MD.  Place on the most painful area.     meloxicam 15 MG tablet  Commonly known as:  MOBIC  Take 15 mg by mouth daily.     multivitamin tablet  Take 1 tablet by mouth daily.     omeprazole 20 MG capsule  Commonly known as:  PRILOSEC  Take 20 mg by mouth daily.     polyethylene glycol packet  Commonly known as:  MIRALAX / GLYCOLAX  Take 17 g by mouth daily. Hold for diarrhea     pregabalin 50 MG capsule  Commonly known as:  LYRICA  Take 50 mg by mouth daily as needed.     raloxifene 60 MG tablet  Commonly known as:  EVISTA  Take 60 mg by mouth every morning.     sennosides-docusate sodium 8.6-50 MG tablet  Commonly known as:  SENOKOT-S  Take 1 tablet by mouth daily.     tretinoin 0.05 % cream  Commonly known as:  RETIN-A  Apply 1 application topically at bedtime.     VITAMIN C PO  Take 1,000 mg by mouth daily.     Vitamin D3 2000 units Tabs  Take 2,000 mg by mouth daily.        Review of Systems  Constitutional: Positive for malaise/fatigue. Negative for fever and chills.  HENT: Negative for congestion, hearing loss and sore throat.   Eyes: Negative for blurred vision.  Respiratory: Negative for cough, sputum production and shortness of breath.   Cardiovascular: Negative for chest pain and leg swelling.  Gastrointestinal: Positive for nausea and constipation. Negative for vomiting, abdominal pain, blood in stool and melena.  Genitourinary: Negative for  dysuria.  Musculoskeletal: Positive for myalgias and back pain. Negative for falls.  Skin: Negative for rash.  Neurological: Positive for weakness. Negative for dizziness, loss of consciousness and headaches.  Psychiatric/Behavioral: Positive for depression and hallucinations. The patient is nervous/anxious.        Confusion, lethargy    Filed Vitals:   11/24/15 1630  BP: 170/90  Pulse: 90  Temp: 98 F (36.7 C)  TempSrc: Oral  Resp: 16  Height: 5\' 2"  (1.575 m)  Weight: 110 lb (49.896 kg)  SpO2: 98%   Body mass index is 20.11 kg/(m^2). Physical Exam  Constitutional: No distress.  Thin white female resting in bed, lethargic  HENT:  Head: Normocephalic and atraumatic.  Right Ear: External ear normal.  Left Ear: External ear normal.  Nose: Nose normal.  Mouth/Throat: Oropharynx is clear and moist.  Eyes: Conjunctivae and EOM are normal. Pupils are equal, round, and reactive to light.  Neck: Neck supple. No JVD present.  Abdominal: Soft. Bowel sounds are normal. She exhibits no distension and no mass. There is no tenderness. There is no rebound and no guarding.  Musculoskeletal: Normal range of motion. She exhibits tenderness.  Of lumbar region of her back  Neurological: No cranial nerve deficit.  Skin: Skin is warm and dry.  Long incision on back without erythema, warmth, drainage  Psychiatric:  Flat affect, tearful even at times, difficulty focusing on discussion and falling asleep    Labs reviewed: Basic Metabolic Panel:  Recent Labs  05/05/15 11/08/15 11/23/15  NA  --  139 137  K  --  4.1 3.5  BUN 8  --  10  CREATININE 0.4*  --  0.5   Liver Function Tests: No results for input(s): AST, ALT, ALKPHOS, BILITOT, PROT, ALBUMIN in the last 8760 hours. No results for input(s): LIPASE, AMYLASE in the last 8760 hours. No results for input(s): AMMONIA in the last 8760 hours. CBC:  Recent Labs  11/08/15 11/23/15  WBC 5.2 8.8  HGB 12.4 9.5*  HCT 37 29*  PLT 368 491*     Cardiac Enzymes: No results for input(s): CKTOTAL, CKMB, CKMBINDEX, TROPONINI in the last 8760 hours. BNP: Invalid input(s): POCBNP No results found for: HGBA1C No results found for: TSH No results found for: VITAMINB12 No results found for: FOLATE No results found for: IRON, TIBC, FERRITIN  Imaging and Procedures obtained prior to SNF admission: Care everywhere reviewed and [facility] records in CP paper chart  Assessment/Plan 1. Spinal stenosis of lumbar region S/P lumbar fusion -see hpi for details -gradually reduce hydrocodone and transition to tramadol as she tolerates due to delirium from stronger narcotics -still having pain at present requiring the hydrocodone so cannot change today (was just reduced yesterday) -cont PT, OT here -hopefully mentation will clear so she can better participate  -also continues on lyrica for the neuropathic pain  2. Delirium, acute -with hallucinations at night due to medications it appears -work on tapering off stronger narcotics asap -try to maintain her sleep/wake cycles as best possible -also not helped by xanax, but this is a chronic med for her  3. Depression -cont cymbalta and wellbutrin, plus xanax for anxiety and monitor  4. Anxiety -cont xanax home medication for her nerves and monitor  5. Slow transit constipation -cont bowel regimen and fluids encouraged  6. Essential hypertension -bp elevated, but delirious and having pain so likely related to that -cont same regimen and monitor -if remains problematic as her confusion improves, adjust meds  7. Gastroesophageal reflux disease without esophagitis -cont home ppi omeprazole and elevate HOB for meals, avoid acidic foods as she is also somewhat nauseous with the pain meds  8. Hyperlipidemia -cont her lipitor and monitor  Family/ staff Communication: discussed with her husband and SNF nurses  Labs/tests ordered:  Cbc, bmp  Hina Gupta L. Jill Ruppe, D.O. [facility] [address] [place], [place] [address] Cell Phone (Mon-Fri 8am-5pm):  [phone] On Call:  [phone] & follow prompts after 5pm & weekends Office Phone:  [phone] Office Fax:  [phone]

## 2015-11-27 DIAGNOSIS — M79604 Pain in right leg: Secondary | ICD-10-CM | POA: Diagnosis not present

## 2015-11-27 DIAGNOSIS — G8929 Other chronic pain: Secondary | ICD-10-CM | POA: Diagnosis not present

## 2015-11-27 DIAGNOSIS — R262 Difficulty in walking, not elsewhere classified: Secondary | ICD-10-CM | POA: Diagnosis not present

## 2015-11-27 DIAGNOSIS — R2681 Unsteadiness on feet: Secondary | ICD-10-CM | POA: Diagnosis not present

## 2015-11-27 DIAGNOSIS — Z5189 Encounter for other specified aftercare: Secondary | ICD-10-CM | POA: Diagnosis not present

## 2015-11-27 DIAGNOSIS — M6249 Contracture of muscle, multiple sites: Secondary | ICD-10-CM | POA: Diagnosis not present

## 2015-11-27 DIAGNOSIS — M4325 Fusion of spine, thoracolumbar region: Secondary | ICD-10-CM | POA: Diagnosis not present

## 2015-11-27 DIAGNOSIS — M545 Low back pain: Secondary | ICD-10-CM | POA: Diagnosis not present

## 2015-11-28 ENCOUNTER — Non-Acute Institutional Stay (SKILLED_NURSING_FACILITY): Payer: Medicare Other | Admitting: Adult Health

## 2015-11-28 ENCOUNTER — Encounter: Payer: Self-pay | Admitting: Adult Health

## 2015-11-28 DIAGNOSIS — F329 Major depressive disorder, single episode, unspecified: Secondary | ICD-10-CM

## 2015-11-28 DIAGNOSIS — E785 Hyperlipidemia, unspecified: Secondary | ICD-10-CM | POA: Diagnosis not present

## 2015-11-28 DIAGNOSIS — M79604 Pain in right leg: Secondary | ICD-10-CM | POA: Diagnosis not present

## 2015-11-28 DIAGNOSIS — M4806 Spinal stenosis, lumbar region: Secondary | ICD-10-CM | POA: Diagnosis not present

## 2015-11-28 DIAGNOSIS — R262 Difficulty in walking, not elsewhere classified: Secondary | ICD-10-CM | POA: Diagnosis not present

## 2015-11-28 DIAGNOSIS — K219 Gastro-esophageal reflux disease without esophagitis: Secondary | ICD-10-CM

## 2015-11-28 DIAGNOSIS — F419 Anxiety disorder, unspecified: Secondary | ICD-10-CM | POA: Diagnosis not present

## 2015-11-28 DIAGNOSIS — M545 Low back pain: Secondary | ICD-10-CM | POA: Diagnosis not present

## 2015-11-28 DIAGNOSIS — G4700 Insomnia, unspecified: Secondary | ICD-10-CM

## 2015-11-28 DIAGNOSIS — K5901 Slow transit constipation: Secondary | ICD-10-CM

## 2015-11-28 DIAGNOSIS — G8929 Other chronic pain: Secondary | ICD-10-CM | POA: Diagnosis not present

## 2015-11-28 DIAGNOSIS — F32A Depression, unspecified: Secondary | ICD-10-CM

## 2015-11-28 DIAGNOSIS — M6249 Contracture of muscle, multiple sites: Secondary | ICD-10-CM | POA: Diagnosis not present

## 2015-11-28 DIAGNOSIS — I10 Essential (primary) hypertension: Secondary | ICD-10-CM

## 2015-11-28 DIAGNOSIS — M48061 Spinal stenosis, lumbar region without neurogenic claudication: Secondary | ICD-10-CM

## 2015-11-28 DIAGNOSIS — Z5189 Encounter for other specified aftercare: Secondary | ICD-10-CM | POA: Diagnosis not present

## 2015-11-28 DIAGNOSIS — M4325 Fusion of spine, thoracolumbar region: Secondary | ICD-10-CM | POA: Diagnosis not present

## 2015-11-28 DIAGNOSIS — R2681 Unsteadiness on feet: Secondary | ICD-10-CM | POA: Diagnosis not present

## 2015-11-28 NOTE — Progress Notes (Signed)
Patient ID: Shannon Berger, female   DOB: 27-Oct-1940, 75 y.o.   MRN: [id number]    DATE:  11/28/15  MRN:  [id number]  BIRTHDAY: August 31, 1940  Facility:  [facility]ing Home Location:  [address] Room Number: 1205-P  LEVEL OF CARE:  SNF (31)  Contact Information    Name Relation Home Work [place] [phone]  [phone]       Code Status History    Date Active Date Inactive Code Status Order ID Comments User Context   09/04/2012 12:05 PM 09/07/2012  1:49 PM Full Code [id number]  Mcarthur Rossetti, MD Inpatient       Chief Complaint  Patient presents with  . Discharge Note    HISTORY OF PRESENT ILLNESS:  This is a 75 year old female who is for discharge home with Home health PT for endurance, CNA for showers, Skilled Nurse for medication management and Social Worker for support referral. DME:  Standard wheelchair with elevating leg rests, anti-tippers and cushion.  Husband verbalized that patient has not been sleeping and hallucinates and sees people walking around the room. Husband stays in the room @ night to watch the patient.    She has been admitted to [facility] on 11/21/15 from [facility] with Lumbar Stenosis S/P anterior/posterior lumbar fusion. Patient was admitted to this facility for short-term rehabilitation after the patient's recent hospitalization.  Patient has completed SNF rehabilitation and therapy has cleared the patient for discharge.  PAST MEDICAL HISTORY:  Past Medical History  Diagnosis Date  . Arthritis   . Hypertension   . Sleep difficulties   . Carotid arterial disease ([place])   . Osteopenia   . Hyperlipidemia   . Osteoarthritis   . Constipation   . Degeneration of lumbar intervertebral disc   . Low back pain     Radiating to left leg  . Spinal stenosis at L4-L5 level   . Fusion of spine of lumbar region   . Dislocation of hip joint prosthesis ([facility])      CURRENT MEDICATIONS: Reviewed   Patient's Medications  New Prescriptions   No medications on file  Previous Medications   ACETAMINOPHEN (TYLENOL) 500 MG TABLET    Take 500 mg by mouth.   ALPRAZOLAM (XANAX) 0.25 MG TABLET    Take 0.125 mg by mouth 3 (three) times daily as needed. Take 1/2 tablet po PRN QHS insomnia or anxiety   ASCORBIC ACID (VITAMIN C PO)    Take 1,000 mg by mouth at bedtime.    ASPIRIN EC 81 MG TABLET    Take 81 mg by mouth at bedtime.    ATORVASTATIN (LIPITOR) 40 MG TABLET    Take 40 mg by mouth daily.   BISOPROLOL-HYDROCHLOROTHIAZIDE (ZIAC) 2.5-6.25 MG PER TABLET    Take 1 tablet by mouth every morning.    BUPROPION (WELLBUTRIN XL) 150 MG 24 HR TABLET    Take 150 mg by mouth daily.   CALCIUM CARBONATE (OS-CAL) 600 MG TABLET    Take 600 mg by mouth daily.   CHOLECALCIFEROL (D3-1000) 1000 UNITS TABLET    Take 1,000 Units by mouth daily.   DULOXETINE (CYMBALTA) 60 MG CAPSULE    Take 60 mg by mouth at bedtime.    HYDROCODONE-ACETAMINOPHEN (NORCO/VICODIN) 5-325 MG TABLET    Take 1 tablet by mouth every 6 (six) hours as needed for moderate pain or severe pain.   LIDOCAINE (LIDODERM) 5 %    Place 2 patches onto  the skin daily. Remove & Discard patch within 12 hours or as directed by MD.  Place on the most painful area.   MELOXICAM (MOBIC) 15 MG TABLET    Take 15 mg by mouth daily.   MULTIPLE VITAMIN (MULTIVITAMIN) TABLET    Take 1 tablet by mouth daily.     OMEGA-3 FATTY ACIDS (FISH OIL PO)    Take 1,000 mg by mouth 2 (two) times daily.    OMEPRAZOLE (PRILOSEC) 20 MG CAPSULE    Take 20 mg by mouth daily.   ONDANSETRON (ZOFRAN) 4 MG TABLET    Take 4 mg by mouth every 6 (six) hours.   POLYETHYLENE GLYCOL (MIRALAX / GLYCOLAX) PACKET    Take 17 g by mouth 2 (two) times daily. Hold for diarrhea   PREGABALIN (LYRICA) 50 MG CAPSULE    Take 50 mg by mouth daily as needed.   SENNOSIDES-DOCUSATE SODIUM (SENOKOT-S) 8.6-50 MG TABLET    Take 2 tablets by mouth 2 (two) times daily.   Modified Medications   No medications  on file  Discontinued Medications   CHOLECALCIFEROL (VITAMIN D3) 2000 UNITS TABS    Take 1,000 mg by mouth daily.    FENOPROFEN (NALFON) 600 MG TABS TABLET    Take 600 mg by mouth daily.   HYDROCODONE-ACETAMINOPHEN (NORCO) 7.5-325 MG TABLET    Take 1-2 tablets by mouth every 4 (four) hours as needed for moderate pain.   RALOXIFENE (EVISTA) 60 MG TABLET    Take 60 mg by mouth every morning.   TRETINOIN (RETIN-A) 0.05 % CREAM    Apply 1 application topically at bedtime.     No Known Allergies   REVIEW OF SYSTEMS:  GENERAL: no change in appetite, no fatigue, no weight changes, no fever, chills or weakness EYES: Denies change in vision, dry eyes, eye pain, itching or discharge EARS: Denies change in hearing, ringing in ears, or earache NOSE: Denies nasal congestion or epistaxis MOUTH and THROAT: Denies oral discomfort, gingival pain or bleeding, pain from teeth or hoarseness   RESPIRATORY: no cough, SOB, DOE, wheezing, hemoptysis CARDIAC: no chest pain, edema or palpitations GI: no abdominal pain, diarrhea, heart burn, nausea or vomiting GU: Denies dysuria, frequency, hematuria, incontinence, or discharge PSYCHIATRIC: Denies feeling of depression or anxiety. No report of paranoia, or agitation, +hallucinations and insomnia,    PHYSICAL EXAMINATION  GENERAL APPEARANCE: Well nourished. In no acute distress. Normal body habitus SKIN:  Has left and right back surgical incision and right lateral anterior and posterior surgical site glued, dry, no erythema; covered with dry dressing HEAD: Normal in size and contour. No evidence of trauma EYES: Lids open and close normally. No blepharitis, entropion or ectropion. PERRL. Conjunctivae are clear and sclerae are white. Lenses are without opacity EARS: Pinnae are normal. Patient hears normal voice tunes of the examiner MOUTH and THROAT: Lips are without lesions. Oral mucosa is moist and without lesions. Tongue is normal in shape, size, and color  and without lesions NECK: supple, trachea midline, no neck masses, no thyroid tenderness, no thyromegaly LYMPHATICS: no LAN in the neck, no supraclavicular LAN RESPIRATORY: breathing is even & unlabored, BS CTAB CARDIAC: RRR, no murmur,no extra heart sounds, no edema GI: abdomen soft, normal BS, no masses, no tenderness, no hepatomegaly, no splenomegaly EXTREMITIES:  Able to move X 4 extremities PSYCHIATRIC: Alert to self. Affect and behavior are appropriate  LABS/RADIOLOGY: Labs reviewed: Basic Metabolic Panel:  Recent Labs  05/05/15 11/08/15 11/23/15  NA  --  139  137  K  --  4.1 3.5  BUN 8  --  10  CREATININE 0.4*  --  0.5   CBC:  Recent Labs  11/08/15 11/23/15  WBC 5.2 8.8  HGB 12.4 9.5*  HCT 37 29*  PLT 368 491*     ASSESSMENT/PLAN:  Lumbar stenosis S/P anterior/posterior lumbar fusion - for Home health PT, OT, CNA, Skilled Nurse and Education officer, museum; continue Norco 5/325 mg 1 tab by mouth every 6 hours when necessary, Lyrica 50 mg 1 capsule by mouth Q HS when necessary, meloxicam 15 mg 1 tab by mouth Q HS when necessary, lidocaine 5% 2 patches onto skin daily and Cymbalta 60 mg 1 capsule by mouth daily at bedtime for pain; follow-up with neurosurgery @ [facility]  Melatonin - start Melatonin 5 mg 1 tab PO Q HS  Anxiety - mood is stable; patient verbalized that she takes Xanax 0.25 mg 1/2 tab = 0.125 mg by mouth Q HS PRN    Constipation - continue senna S 2 tabs by mouth twice a day and MiraLAX 17 g by mouth twice a day  Hypertension - continue Ziac 2.5-6.25 mg 1 tab by mouth daily; check BMP  Depression - continue bupropion HCl XL 150 mg 1 tab by mouth daily  Hyperlipidemia - continue atorvastatin 40 mg 1 tab by mouth daily at bedtime  GERD - stable; continue Prilosec 20 mg 1 capsule by mouth daily at bedtime      I have filled out patient's discharge paperwork and written prescriptions.  Patient will receive home health PT, OT, ST, Skilled Nurse and CNA.  DME  provided:  Standard wheelchair with elevating leg rests, anti-tippers and cushion  Total discharge time: Greater than 30 minutes  Discharge time involved coordination of the discharge process with social worker, nursing staff and therapy department. Medical justification for home health services/DME verified.     Durenda Age, NP [employer] [phone]

## 2015-12-02 DIAGNOSIS — M4327 Fusion of spine, lumbosacral region: Secondary | ICD-10-CM | POA: Diagnosis not present

## 2015-12-02 DIAGNOSIS — Z7982 Long term (current) use of aspirin: Secondary | ICD-10-CM | POA: Diagnosis not present

## 2015-12-02 DIAGNOSIS — Z96642 Presence of left artificial hip joint: Secondary | ICD-10-CM | POA: Diagnosis not present

## 2015-12-02 DIAGNOSIS — I10 Essential (primary) hypertension: Secondary | ICD-10-CM | POA: Diagnosis not present

## 2015-12-02 DIAGNOSIS — M4326 Fusion of spine, lumbar region: Secondary | ICD-10-CM | POA: Diagnosis not present

## 2015-12-02 DIAGNOSIS — Z4789 Encounter for other orthopedic aftercare: Secondary | ICD-10-CM | POA: Diagnosis not present

## 2015-12-02 DIAGNOSIS — Z981 Arthrodesis status: Secondary | ICD-10-CM | POA: Diagnosis not present

## 2015-12-02 DIAGNOSIS — M4806 Spinal stenosis, lumbar region: Secondary | ICD-10-CM | POA: Diagnosis not present

## 2015-12-26 ENCOUNTER — Encounter (HOSPITAL_COMMUNITY): Payer: Self-pay | Admitting: Emergency Medicine

## 2015-12-26 ENCOUNTER — Emergency Department (HOSPITAL_COMMUNITY): Payer: Medicare Other

## 2015-12-26 ENCOUNTER — Other Ambulatory Visit: Payer: Self-pay

## 2015-12-26 ENCOUNTER — Emergency Department (HOSPITAL_COMMUNITY)
Admission: EM | Admit: 2015-12-26 | Discharge: 2015-12-26 | Disposition: A | Discharge status: Home / Self Care | Payer: Medicare Other | Attending: Emergency Medicine | Admitting: Emergency Medicine

## 2015-12-26 DIAGNOSIS — Z7982 Long term (current) use of aspirin: Secondary | ICD-10-CM | POA: Insufficient documentation

## 2015-12-26 DIAGNOSIS — R531 Weakness: Secondary | ICD-10-CM | POA: Diagnosis not present

## 2015-12-26 DIAGNOSIS — R0602 Shortness of breath: Secondary | ICD-10-CM

## 2015-12-26 DIAGNOSIS — I10 Essential (primary) hypertension: Secondary | ICD-10-CM | POA: Diagnosis not present

## 2015-12-26 DIAGNOSIS — Z79899 Other long term (current) drug therapy: Secondary | ICD-10-CM | POA: Insufficient documentation

## 2015-12-26 DIAGNOSIS — Z96642 Presence of left artificial hip joint: Secondary | ICD-10-CM | POA: Diagnosis not present

## 2015-12-26 DIAGNOSIS — R000 Tachycardia, unspecified: Secondary | ICD-10-CM | POA: Diagnosis not present

## 2015-12-26 LAB — BASIC METABOLIC PANEL
Anion gap: 8 (ref 5–15)
BUN: 11 mg/dL (ref 6–20)
CO2: 26 mmol/L (ref 22–32)
Calcium: 9.9 mg/dL (ref 8.9–10.3)
Chloride: 104 mmol/L (ref 101–111)
Creatinine, Ser: 0.68 mg/dL (ref 0.44–1.00)
GFR calc Af Amer: >60 mL/min (ref >60)
GFR calc non Af Amer: >60 mL/min (ref >60)
Glucose, Bld: 135 mg/dL — ABNORMAL HIGH (ref 65–99)
Potassium: 4.1 mmol/L (ref 3.5–5.1)
Sodium: 138 mmol/L (ref 135–145)

## 2015-12-26 LAB — CBC
HCT: 34 % — ABNORMAL LOW (ref 36.0–46.0)
Hemoglobin: 11.4 g/dL — ABNORMAL LOW (ref 12.0–15.0)
MCH: 33.2 pg (ref 26.0–34.0)
MCHC: 33.5 g/dL (ref 30.0–36.0)
MCV: 99.1 fL (ref 78.0–100.0)
Platelets: 322 10*3/uL (ref 150–400)
RBC: 3.43 MIL/uL — ABNORMAL LOW (ref 3.87–5.11)
RDW: 13.3 % (ref 11.5–15.5)
WBC: 5 10*3/uL (ref 4.0–10.5)

## 2015-12-26 LAB — BRAIN NATRIURETIC PEPTIDE: B Natriuretic Peptide: 184.7 pg/mL — ABNORMAL HIGH (ref 0.0–100.0)

## 2015-12-26 LAB — I-STAT TROPONIN, ED: Troponin i, poc: 0.01 ng/mL (ref 0.00–0.08)

## 2015-12-26 LAB — D-DIMER, QUANTITATIVE: D-Dimer, Quant: 2.05 ug/mL-FEU — ABNORMAL HIGH (ref 0.00–0.50)

## 2015-12-26 MED ORDER — SODIUM CHLORIDE 0.9 % IV BOLUS (SEPSIS)
1000.0000 mL | Freq: Once | INTRAVENOUS | Status: AC
  Administered 2015-12-26: 1000 mL via INTRAVENOUS

## 2015-12-26 MED ORDER — IOPAMIDOL (ISOVUE-370) INJECTION 76%
INTRAVENOUS | Status: AC
  Administered 2015-12-26: 100 mL
  Filled 2015-12-26: qty 100

## 2015-12-26 NOTE — ED Provider Notes (Signed)
CSN: [id number]     Arrival date & time 12/26/15  1441 History   First MD Initiated Contact with Patient 12/26/15 1532     Chief Complaint  Patient presents with  . Shortness of Breath   HPI  Pt had Posterior Spinal Fusion L1-L4 with Removal of Hardware, L4-S1 at [place] on 11/16/15  Today, bp was 30 point loss in systolic from sitting to standing. Systolic did go to [id number] per husband.   Pt endorses chronic cp,  More frequent in last few days. Described as sharp, constant, dull ache. Has seen cardiologist in past because she sometimes feels palpitatiosn and was told everything was fine. Pain is not changed from today. No exertional pain.  Sob however is worse with exertion. If she gets up, feels very weak. No cough, no fevers, no hemoptysis, no leg pain, no hx pe or dvt. ASA only AC but stopped 5 days ago.  Stopped all bp meds 5 days ago - only one med.   No rectal bleeding, no nausea  Decreased po intake  No hematuria, no dysuria  Walks with a cane and walker but very difficult, itss actually a little better (her sob) but because followed up with pcp, saw bp low, so that prompted them to come  Past Medical History  Diagnosis Date  . Arthritis   . Hypertension   . Sleep difficulties   . Carotid arterial disease ([place])   . Osteopenia   . Hyperlipidemia   . Osteoarthritis   . Constipation   . Degeneration of lumbar intervertebral disc   . Low back pain     Radiating to left leg  . Spinal stenosis at L4-L5 level   . Fusion of spine of lumbar region   . Dislocation of hip joint prosthesis [facility])    Past Surgical History  Procedure Laterality Date  . Bunionectomy    . Tonsillectomy and adenoidectomy    . Tendon repair      left hip  . Foot surgery      Status post right foot surgery  . Other surgical history      post fusion of her right thumb  . Pilonidal cyst / sinus excision    . Hand fusion      Right thumb  . Tendon repair      Reattachment of tendon- Left hip  .  Total hip arthroplasty Left 09/04/2012    Procedure: LEFT TOTAL HIP ARTHROPLASTY ANTERIOR APPROACH;  Surgeon: Mcarthur Rossetti, MD;  Location: [facility];  Service: Orthopedics;  Laterality: Left;  Left Total Hip Arthroplasty, Anterior Approach  . Joint replacement Left 2014    Hip  . Back surgery    . Lumbar fusion      Anterior and posterior   Family History  Problem Relation Age of Onset  . Heart failure Mother   . Cancer Mother   . Hypertension Mother   . Other Mother     Varicose Veins  . Diabetes Father   . Heart disease Father   . Hypertension Father   . Heart attack Father    Social History  Substance Use Topics  . Smoking status: Never Smoker   . Smokeless tobacco: Never Used  . Alcohol Use: 0.0 oz/week    0 Standard drinks or equivalent per week     Comment: 2 gl wine per week   OB History    No data available     Review of Systems  Constitutional: Negative for fever.  Allergic/Immunologic: Negative for immunocompromised state.  All other systems reviewed and are negative.     Allergies  Celebrex  Home Medications   Prior to Admission medications   Medication Sig Start Date End Date Taking? Authorizing Provider  acetaminophen (TYLENOL) 500 MG tablet Take 325 mg by mouth.    Yes Historical Provider, MD  ALPRAZolam (XANAX) 0.25 MG tablet Take 0.125 mg by mouth 3 (three) times daily as needed. Take 1/2 tablet po PRN QHS insomnia or anxiety   Yes Historical Provider, MD  Ascorbic Acid (VITAMIN C PO) Take 1,000 mg by mouth at bedtime.    Yes Historical Provider, MD  aspirin EC 81 MG tablet Take 81 mg by mouth at bedtime.    Yes Historical Provider, MD  atorvastatin (LIPITOR) 40 MG tablet Take 40 mg by mouth daily. 01/29/15  Yes Historical Provider, MD  bisoprolol-hydrochlorothiazide [facility]) 2.5-6.25 MG per tablet Take 1 tablet by mouth every morning.  12/17/10  Yes Historical Provider, MD  buPROPion (WELLBUTRIN XL) 150 MG 24 hr tablet Take 150 mg by mouth  daily. 01/24/15  Yes Historical Provider, MD  calcium carbonate (OS-CAL) 600 MG tablet Take 600 mg by mouth daily.   Yes Historical Provider, MD  Cholecalciferol (D3-1000) 1000 units tablet Take 1,000 Units by mouth daily.   Yes Historical Provider, MD  DULoxetine (CYMBALTA) 60 MG capsule Take 60 mg by mouth at bedtime.    Yes Historical Provider, MD  meloxicam (MOBIC) 15 MG tablet Take 15 mg by mouth daily.   Yes Historical Provider, MD  Multiple Vitamin (MULTIVITAMIN) tablet Take 1 tablet by mouth daily.     Yes Historical Provider, MD  Omega-3 Fatty Acids (FISH OIL PO) Take 1,000 mg by mouth 2 (two) times daily.    Yes Historical Provider, MD  omeprazole (PRILOSEC) 20 MG capsule Take 20 mg by mouth daily.   Yes Historical Provider, MD  polyethylene glycol (MIRALAX / GLYCOLAX) packet Take 17 g by mouth 2 (two) times daily. Hold for diarrhea   Yes Historical Provider, MD   BP 153/97 mmHg  Pulse 123  Temp(Src) 97.7 F (36.5 C) (Oral)  Resp 25  SpO2 100% Physical Exam  Constitutional: She appears well-developed and well-nourished. No distress.  HENT:  Head: Normocephalic and atraumatic.  Eyes: Conjunctivae are normal. Right eye exhibits no discharge. Left eye exhibits no discharge.  Neck: Normal range of motion. Neck supple. No JVD present.  Cardiovascular: Regular rhythm, normal heart sounds and intact distal pulses.   No murmur heard. Tachycardic to 120s  Pulmonary/Chest: Effort normal and breath sounds normal. No respiratory distress.  Abdominal: Soft. Bowel sounds are normal. She exhibits no distension and no mass. There is no tenderness. There is no rebound and no guarding.  Musculoskeletal: She exhibits no edema (nole edema or swelling).  Neurological: She is alert.  Skin: Skin is warm. No rash noted.  Psychiatric: She has a normal mood and affect.  Nursing note and vitals reviewed.   ED Course  Procedures (including critical care time) Labs Review Labs Reviewed  BASIC  METABOLIC PANEL - Abnormal; Notable for the following:    Glucose, Bld 135 (*)    All other components within normal limits  CBC - Abnormal; Notable for the following:    RBC 3.43 (*)    Hemoglobin 11.4 (*)    HCT 34.0 (*)    All other components within normal limits  D-DIMER, QUANTITATIVE (NOT AT [facility]) - Abnormal; Notable for the following:  D-Dimer, Quant 2.05 (*)    All other components within normal limits  BRAIN NATRIURETIC PEPTIDE - Abnormal; Notable for the following:    B Natriuretic Peptide 184.7 (*)    All other components within normal limits  I-STAT TROPOININ, ED    Imaging Review Dg Chest 2 View  12/26/2015  CLINICAL DATA:  Shortness of breath for 6 weeks since low back surgery. EXAM: CHEST  2 VIEW COMPARISON:  PA and lateral chest 08/21/2012. FINDINGS: The lungs are clear. Heart size is normal. No pneumothorax or pleural effusion. Lower thoracic fusion hardware extends into the lumbar spine and below the inferior margin of film. IMPRESSION: No acute disease. Electronically Signed   By: Inge Rise M.D.   On: 12/26/2015 15:51   Ct Angio Chest Pe W/cm &/or Wo Cm  12/26/2015  CLINICAL DATA:  Shortness of Breath EXAM: CT ANGIOGRAPHY CHEST WITH CONTRAST TECHNIQUE: Multidetector CT imaging of the chest was performed using the standard protocol during bolus administration of intravenous contrast. Multiplanar CT image reconstructions and MIPs were obtained to evaluate the vascular anatomy. CONTRAST:  100 cc Isovue COMPARISON:  None. FINDINGS: Mediastinum/Lymph Nodes: Images of the thoracic inlet are unremarkable. Central airways are patent. No mediastinal hematoma or adenopathy. Mild atherosclerotic calcifications of thoracic aorta. No aortic aneurysm or dissection. The study is of excellent technical quality. No pulmonary embolus is noted. Atherosclerotic calcifications are noted coronary arteries. Lungs/Pleura: Images of the lung parenchyma shows no acute infiltrate or pulmonary  edema. There is mild scarring bilateral lung bases anteriorly. No focal consolidation. Linear scarring noted in the right lower lobe posteriorly. Mild bilateral apical pleural parenchymal scarring. Minimal hyperinflation. Upper abdomen: The visualized upper abdomen shows no adrenal gland mass. There are extensive artifacts from metallic rods lower thoracic and lumbar spine. Nonobstructive calcified calculus in midpole of the left kidney measures 1.4 mm. There is a right hepatic lobe cyst measures 9 mm. Musculoskeletal: No destructive bony lesions are noted. Mild degenerative changes are noted mid thoracic and upper thoracic spine. Metallic fixation rods and transpedicular screws are noted lower thoracic and lumbar spine. Review of the MIP images confirms the above findings. IMPRESSION: 1. No pulmonary embolus is noted. 2. No mediastinal hematoma or adenopathy. 3. No acute infiltrate or pulmonary edema. 4. No aortic aneurysm or dissection. 5. Left nonobstructive nephrolithiasis. Electronically Signed   By: Lahoma Crocker M.D.   On: 12/26/2015 18:09   I have personally reviewed and evaluated these images and lab results as part of my medical decision-making.   EKG Interpretation   Date/Time:  Tuesday December 26 2015 14:55:21 EDT Ventricular Rate:  119 PR Interval:  146 QRS Duration: 70 QT Interval:  320 QTC Calculation: 450 R Axis:   88 Text Interpretation:  Sinus tachycardia `rate much faster than prior ecg  Confirmed by Ashok Cordia  MD, Lennette Bihari ([id number]) on 12/26/2015 4:30:15 PM      MDM   Final diagnoses:  Shortness of breath   CTA of lungs is negative for PE. Troponin is negative and EKG unremarkable for acute ischemia. However, patient is tachycardic. After a liter of fluids, not orthostatic and tachycardia has resolved. Doubt dissection as patient has no back pain tearing quality and no chest pain. No CHF diagnosis and BNP is unremarkable. Chest x-ray without pneumonia. Patient had a hypertensive here,  recently discontinued her blood pressure medications. Advised to restart and to increase water intake. Patient to follow-up with primary care provider tomorrow. Return for worsening symptoms, chest pain, radiation to  arms or neck with worsening with exertion, intractable vomiting or other concerning symptoms. Patient discharged in good condition.   Karma Greaser, MD 12/26/15 1930  Lajean Saver, MD 12/26/15 2258

## 2015-12-26 NOTE — ED Notes (Signed)
Pt up to bedpan for 200 cc yellow urine

## 2015-12-26 NOTE — Discharge Instructions (Signed)

## 2015-12-26 NOTE — ED Notes (Signed)
Pt hx of back surgery, sees a home health nurse for physical therapy. Pt states "Im just not getting any better, im winded getting out of the bed" Pt states "i cant catch my breath when im walking. C/o SOB. States her PCP worried about PE.

## 2015-12-26 NOTE — ED Notes (Signed)
Pt states she understands instructions.She and husband can do teach back for verification.

## 2015-12-26 NOTE — ED Notes (Signed)
Patient transported to X-ray 

## 2015-12-28 DIAGNOSIS — G250 Essential tremor: Secondary | ICD-10-CM | POA: Diagnosis not present

## 2015-12-28 DIAGNOSIS — I2510 Atherosclerotic heart disease of native coronary artery without angina pectoris: Secondary | ICD-10-CM | POA: Diagnosis not present

## 2015-12-28 DIAGNOSIS — M5380 Other specified dorsopathies, site unspecified: Secondary | ICD-10-CM | POA: Diagnosis not present

## 2015-12-28 DIAGNOSIS — I10 Essential (primary) hypertension: Secondary | ICD-10-CM | POA: Diagnosis not present

## 2015-12-28 DIAGNOSIS — F418 Other specified anxiety disorders: Secondary | ICD-10-CM | POA: Diagnosis not present

## 2015-12-28 DIAGNOSIS — Z681 Body mass index (BMI) 19 or less, adult: Secondary | ICD-10-CM | POA: Diagnosis not present

## 2016-01-04 DIAGNOSIS — M4806 Spinal stenosis, lumbar region: Secondary | ICD-10-CM | POA: Diagnosis not present

## 2016-01-04 DIAGNOSIS — R262 Difficulty in walking, not elsewhere classified: Secondary | ICD-10-CM | POA: Diagnosis not present

## 2016-01-11 DIAGNOSIS — L814 Other melanin hyperpigmentation: Secondary | ICD-10-CM | POA: Diagnosis not present

## 2016-01-11 DIAGNOSIS — D1801 Hemangioma of skin and subcutaneous tissue: Secondary | ICD-10-CM | POA: Diagnosis not present

## 2016-01-11 DIAGNOSIS — R262 Difficulty in walking, not elsewhere classified: Secondary | ICD-10-CM | POA: Diagnosis not present

## 2016-01-11 DIAGNOSIS — L821 Other seborrheic keratosis: Secondary | ICD-10-CM | POA: Diagnosis not present

## 2016-01-11 DIAGNOSIS — B351 Tinea unguium: Secondary | ICD-10-CM | POA: Diagnosis not present

## 2016-01-11 DIAGNOSIS — M4806 Spinal stenosis, lumbar region: Secondary | ICD-10-CM | POA: Diagnosis not present

## 2016-01-11 DIAGNOSIS — D692 Other nonthrombocytopenic purpura: Secondary | ICD-10-CM | POA: Diagnosis not present

## 2016-01-16 DIAGNOSIS — M4806 Spinal stenosis, lumbar region: Secondary | ICD-10-CM | POA: Diagnosis not present

## 2016-01-16 DIAGNOSIS — R262 Difficulty in walking, not elsewhere classified: Secondary | ICD-10-CM | POA: Diagnosis not present

## 2016-01-18 DIAGNOSIS — M4806 Spinal stenosis, lumbar region: Secondary | ICD-10-CM | POA: Diagnosis not present

## 2016-01-18 DIAGNOSIS — R262 Difficulty in walking, not elsewhere classified: Secondary | ICD-10-CM | POA: Diagnosis not present

## 2016-01-22 DIAGNOSIS — M7088 Other soft tissue disorders related to use, overuse and pressure other site: Secondary | ICD-10-CM | POA: Diagnosis not present

## 2016-01-22 DIAGNOSIS — I10 Essential (primary) hypertension: Secondary | ICD-10-CM | POA: Diagnosis not present

## 2016-01-22 DIAGNOSIS — Z681 Body mass index (BMI) 19 or less, adult: Secondary | ICD-10-CM | POA: Diagnosis not present

## 2016-01-23 DIAGNOSIS — R262 Difficulty in walking, not elsewhere classified: Secondary | ICD-10-CM | POA: Diagnosis not present

## 2016-01-23 DIAGNOSIS — M4806 Spinal stenosis, lumbar region: Secondary | ICD-10-CM | POA: Diagnosis not present

## 2016-01-25 ENCOUNTER — Other Ambulatory Visit (HOSPITAL_COMMUNITY): Payer: Self-pay | Admitting: Family Medicine

## 2016-01-25 ENCOUNTER — Ambulatory Visit (HOSPITAL_COMMUNITY)
Admission: RE | Admit: 2016-01-25 | Discharge: 2016-01-25 | Disposition: A | Discharge status: Home / Self Care | Payer: Medicare Other | Source: Ambulatory Visit | Attending: Vascular Surgery | Admitting: Vascular Surgery

## 2016-01-25 DIAGNOSIS — E785 Hyperlipidemia, unspecified: Secondary | ICD-10-CM | POA: Diagnosis not present

## 2016-01-25 DIAGNOSIS — M7989 Other specified soft tissue disorders: Secondary | ICD-10-CM | POA: Diagnosis not present

## 2016-01-25 DIAGNOSIS — R262 Difficulty in walking, not elsewhere classified: Secondary | ICD-10-CM | POA: Diagnosis not present

## 2016-01-25 DIAGNOSIS — M4806 Spinal stenosis, lumbar region: Secondary | ICD-10-CM | POA: Diagnosis not present

## 2016-01-25 DIAGNOSIS — I10 Essential (primary) hypertension: Secondary | ICD-10-CM | POA: Diagnosis not present

## 2016-01-30 DIAGNOSIS — M4806 Spinal stenosis, lumbar region: Secondary | ICD-10-CM | POA: Diagnosis not present

## 2016-01-30 DIAGNOSIS — R262 Difficulty in walking, not elsewhere classified: Secondary | ICD-10-CM | POA: Diagnosis not present

## 2016-01-31 DIAGNOSIS — E784 Other hyperlipidemia: Secondary | ICD-10-CM | POA: Diagnosis not present

## 2016-01-31 DIAGNOSIS — D539 Nutritional anemia, unspecified: Secondary | ICD-10-CM | POA: Diagnosis not present

## 2016-01-31 DIAGNOSIS — R8299 Other abnormal findings in urine: Secondary | ICD-10-CM | POA: Diagnosis not present

## 2016-01-31 DIAGNOSIS — N390 Urinary tract infection, site not specified: Secondary | ICD-10-CM | POA: Diagnosis not present

## 2016-01-31 DIAGNOSIS — M859 Disorder of bone density and structure, unspecified: Secondary | ICD-10-CM | POA: Diagnosis not present

## 2016-01-31 DIAGNOSIS — I10 Essential (primary) hypertension: Secondary | ICD-10-CM | POA: Diagnosis not present

## 2016-02-02 DIAGNOSIS — R262 Difficulty in walking, not elsewhere classified: Secondary | ICD-10-CM | POA: Diagnosis not present

## 2016-02-02 DIAGNOSIS — M4806 Spinal stenosis, lumbar region: Secondary | ICD-10-CM | POA: Diagnosis not present

## 2016-02-06 DIAGNOSIS — R262 Difficulty in walking, not elsewhere classified: Secondary | ICD-10-CM | POA: Diagnosis not present

## 2016-02-06 DIAGNOSIS — M4806 Spinal stenosis, lumbar region: Secondary | ICD-10-CM | POA: Diagnosis not present

## 2016-02-07 DIAGNOSIS — M25552 Pain in left hip: Secondary | ICD-10-CM | POA: Diagnosis not present

## 2016-02-07 DIAGNOSIS — I10 Essential (primary) hypertension: Secondary | ICD-10-CM | POA: Diagnosis not present

## 2016-02-07 DIAGNOSIS — I6529 Occlusion and stenosis of unspecified carotid artery: Secondary | ICD-10-CM | POA: Diagnosis not present

## 2016-02-07 DIAGNOSIS — I2510 Atherosclerotic heart disease of native coronary artery without angina pectoris: Secondary | ICD-10-CM | POA: Diagnosis not present

## 2016-02-07 DIAGNOSIS — M5380 Other specified dorsopathies, site unspecified: Secondary | ICD-10-CM | POA: Diagnosis not present

## 2016-02-07 DIAGNOSIS — Z0000 Encounter for general adult medical examination without abnormal findings: Secondary | ICD-10-CM | POA: Diagnosis not present

## 2016-02-07 DIAGNOSIS — M7088 Other soft tissue disorders related to use, overuse and pressure other site: Secondary | ICD-10-CM | POA: Diagnosis not present

## 2016-02-07 DIAGNOSIS — D538 Other specified nutritional anemias: Secondary | ICD-10-CM | POA: Diagnosis not present

## 2016-02-07 DIAGNOSIS — E784 Other hyperlipidemia: Secondary | ICD-10-CM | POA: Diagnosis not present

## 2016-02-07 DIAGNOSIS — F418 Other specified anxiety disorders: Secondary | ICD-10-CM | POA: Diagnosis not present

## 2016-02-08 DIAGNOSIS — M4806 Spinal stenosis, lumbar region: Secondary | ICD-10-CM | POA: Diagnosis not present

## 2016-02-08 DIAGNOSIS — R262 Difficulty in walking, not elsewhere classified: Secondary | ICD-10-CM | POA: Diagnosis not present

## 2016-02-13 DIAGNOSIS — M4806 Spinal stenosis, lumbar region: Secondary | ICD-10-CM | POA: Diagnosis not present

## 2016-02-13 DIAGNOSIS — R262 Difficulty in walking, not elsewhere classified: Secondary | ICD-10-CM | POA: Diagnosis not present

## 2016-02-15 DIAGNOSIS — M4806 Spinal stenosis, lumbar region: Secondary | ICD-10-CM | POA: Diagnosis not present

## 2016-02-15 DIAGNOSIS — R262 Difficulty in walking, not elsewhere classified: Secondary | ICD-10-CM | POA: Diagnosis not present

## 2016-02-20 DIAGNOSIS — R262 Difficulty in walking, not elsewhere classified: Secondary | ICD-10-CM | POA: Diagnosis not present

## 2016-02-20 DIAGNOSIS — M4806 Spinal stenosis, lumbar region: Secondary | ICD-10-CM | POA: Diagnosis not present

## 2016-02-21 DIAGNOSIS — M4806 Spinal stenosis, lumbar region: Secondary | ICD-10-CM | POA: Diagnosis not present

## 2016-02-22 DIAGNOSIS — M4806 Spinal stenosis, lumbar region: Secondary | ICD-10-CM | POA: Diagnosis not present

## 2016-02-22 DIAGNOSIS — R262 Difficulty in walking, not elsewhere classified: Secondary | ICD-10-CM | POA: Diagnosis not present

## 2016-02-27 DIAGNOSIS — R262 Difficulty in walking, not elsewhere classified: Secondary | ICD-10-CM | POA: Diagnosis not present

## 2016-02-27 DIAGNOSIS — M4806 Spinal stenosis, lumbar region: Secondary | ICD-10-CM | POA: Diagnosis not present

## 2016-02-28 DIAGNOSIS — Z124 Encounter for screening for malignant neoplasm of cervix: Secondary | ICD-10-CM | POA: Diagnosis not present

## 2016-02-29 DIAGNOSIS — M4806 Spinal stenosis, lumbar region: Secondary | ICD-10-CM | POA: Diagnosis not present

## 2016-02-29 DIAGNOSIS — R262 Difficulty in walking, not elsewhere classified: Secondary | ICD-10-CM | POA: Diagnosis not present

## 2016-03-06 DIAGNOSIS — M4806 Spinal stenosis, lumbar region: Secondary | ICD-10-CM | POA: Diagnosis not present

## 2016-03-06 DIAGNOSIS — R262 Difficulty in walking, not elsewhere classified: Secondary | ICD-10-CM | POA: Diagnosis not present

## 2016-03-08 DIAGNOSIS — M4806 Spinal stenosis, lumbar region: Secondary | ICD-10-CM | POA: Diagnosis not present

## 2016-03-08 DIAGNOSIS — R262 Difficulty in walking, not elsewhere classified: Secondary | ICD-10-CM | POA: Diagnosis not present

## 2016-03-12 DIAGNOSIS — R262 Difficulty in walking, not elsewhere classified: Secondary | ICD-10-CM | POA: Diagnosis not present

## 2016-03-12 DIAGNOSIS — M4806 Spinal stenosis, lumbar region: Secondary | ICD-10-CM | POA: Diagnosis not present

## 2016-03-14 DIAGNOSIS — R262 Difficulty in walking, not elsewhere classified: Secondary | ICD-10-CM | POA: Diagnosis not present

## 2016-03-14 DIAGNOSIS — M4806 Spinal stenosis, lumbar region: Secondary | ICD-10-CM | POA: Diagnosis not present

## 2016-03-22 DIAGNOSIS — M4806 Spinal stenosis, lumbar region: Secondary | ICD-10-CM | POA: Diagnosis not present

## 2016-03-22 DIAGNOSIS — R262 Difficulty in walking, not elsewhere classified: Secondary | ICD-10-CM | POA: Diagnosis not present

## 2016-03-27 DIAGNOSIS — M4806 Spinal stenosis, lumbar region: Secondary | ICD-10-CM | POA: Diagnosis not present

## 2016-03-27 DIAGNOSIS — R262 Difficulty in walking, not elsewhere classified: Secondary | ICD-10-CM | POA: Diagnosis not present

## 2016-03-29 ENCOUNTER — Encounter: Payer: Self-pay | Admitting: Vascular Surgery

## 2016-04-03 ENCOUNTER — Encounter (HOSPITAL_COMMUNITY): Payer: Self-pay

## 2016-04-03 ENCOUNTER — Ambulatory Visit: Payer: Medicare Other | Admitting: Vascular Surgery

## 2016-04-05 DIAGNOSIS — M4806 Spinal stenosis, lumbar region: Secondary | ICD-10-CM | POA: Diagnosis not present

## 2016-04-05 DIAGNOSIS — R262 Difficulty in walking, not elsewhere classified: Secondary | ICD-10-CM | POA: Diagnosis not present

## 2016-04-06 DIAGNOSIS — Z23 Encounter for immunization: Secondary | ICD-10-CM | POA: Diagnosis not present

## 2016-05-22 ENCOUNTER — Ambulatory Visit: Payer: Medicare Other | Admitting: Vascular Surgery

## 2016-05-22 ENCOUNTER — Ambulatory Visit (HOSPITAL_COMMUNITY)
Admission: RE | Admit: 2016-05-22 | Discharge: 2016-05-22 | Disposition: A | Discharge status: Home / Self Care | Payer: Medicare Other | Source: Ambulatory Visit | Attending: Vascular Surgery | Admitting: Vascular Surgery

## 2016-05-22 DIAGNOSIS — I6529 Occlusion and stenosis of unspecified carotid artery: Secondary | ICD-10-CM

## 2016-05-22 DIAGNOSIS — I6523 Occlusion and stenosis of bilateral carotid arteries: Secondary | ICD-10-CM | POA: Diagnosis not present

## 2016-06-05 ENCOUNTER — Encounter: Payer: Self-pay | Admitting: Vascular Surgery

## 2016-06-05 DIAGNOSIS — M48061 Spinal stenosis, lumbar region without neurogenic claudication: Secondary | ICD-10-CM | POA: Diagnosis not present

## 2016-06-05 DIAGNOSIS — M48062 Spinal stenosis, lumbar region with neurogenic claudication: Secondary | ICD-10-CM | POA: Diagnosis not present

## 2016-06-05 DIAGNOSIS — M5136 Other intervertebral disc degeneration, lumbar region: Secondary | ICD-10-CM | POA: Diagnosis not present

## 2016-06-05 DIAGNOSIS — Z981 Arthrodesis status: Secondary | ICD-10-CM | POA: Diagnosis not present

## 2016-06-11 ENCOUNTER — Encounter: Payer: Self-pay | Admitting: Vascular Surgery

## 2016-06-12 ENCOUNTER — Ambulatory Visit (INDEPENDENT_AMBULATORY_CARE_PROVIDER_SITE_OTHER): Payer: Medicare Other | Admitting: Vascular Surgery

## 2016-06-12 ENCOUNTER — Encounter: Payer: Self-pay | Admitting: Vascular Surgery

## 2016-06-12 VITALS — BP 138/81 | HR 60 | Temp 97.0°F | Resp 16 | Ht 62.5 in | Wt 109.0 lb

## 2016-06-12 DIAGNOSIS — S40812S Abrasion of left upper arm, sequela: Secondary | ICD-10-CM | POA: Diagnosis not present

## 2016-06-12 DIAGNOSIS — I6523 Occlusion and stenosis of bilateral carotid arteries: Secondary | ICD-10-CM

## 2016-06-12 DIAGNOSIS — L0889 Other specified local infections of the skin and subcutaneous tissue: Secondary | ICD-10-CM | POA: Diagnosis not present

## 2016-06-12 NOTE — Progress Notes (Signed)
Patient name: Shannon Berger MRN: [id number] DOB: [birth year] Sex: female  REASON FOR VISIT: Follow up of carotid disease.  HPI: Shannon Berger is a 75 y.o. female who I last saw on 09/27/2015. I have been following A 60-79% left carotid stenosis and a 40-59% right carotid stenosis. She comes in for a 6 month follow up visit.   Since I saw her last, she denies any history of stroke, TIAs, expressive or receptive aphasia, or amaurosis fugax. She is on aspirin and is on a statin. She stays fairly active and eats fairly well.  There have been no significant changes in her medical history except that she is having to have a screw removed from her back now that her back is fused.  Past Medical History:  Diagnosis Date  . Arthritis   . Carotid arterial disease ([place])   . Constipation   . Degeneration of lumbar intervertebral disc   . Dislocation of hip joint prosthesis ([place])   . Fusion of spine of lumbar region   . Hyperlipidemia   . Hypertension   . Low back pain    Radiating to left leg  . Osteoarthritis   . Osteopenia   . Sleep difficulties   . Spinal stenosis at L4-L5 level     Family History  Problem Relation Age of Onset  . Heart failure Mother   . Cancer Mother   . Hypertension Mother   . Other Mother     Varicose Veins  . Diabetes Father   . Heart disease Father   . Hypertension Father   . Heart attack Father     SOCIAL HISTORY: Social History  Substance Use Topics  . Smoking status: Never Smoker  . Smokeless tobacco: Never Used  . Alcohol use 0.0 oz/week     Comment: 2 gl wine per week    Allergies  Allergen Reactions  . Celebrex [Celecoxib]     Current Outpatient Prescriptions  Medication Sig Dispense Refill  . acetaminophen (TYLENOL) 500 MG tablet Take 325 mg by mouth.     . ALPRAZolam (XANAX) 0.25 MG tablet Take 0.125 mg by mouth 3 (three) times daily as needed. Take 1/2 tablet po PRN QHS insomnia or anxiety    . Ascorbic Acid (VITAMIN C  PO) Take 1,000 mg by mouth at bedtime.     Marland Kitchen aspirin EC 81 MG tablet Take 81 mg by mouth at bedtime.     Marland Kitchen atorvastatin (LIPITOR) 40 MG tablet Take 40 mg by mouth daily.  3  . bisoprolol-hydrochlorothiazide (ZIAC) 2.5-6.25 MG per tablet Take 1 tablet by mouth every morning.     Marland Kitchen buPROPion (WELLBUTRIN XL) 150 MG 24 hr tablet Take 150 mg by mouth daily.  3  . calcium carbonate (OS-CAL) 600 MG tablet Take 600 mg by mouth daily.    . Cholecalciferol (D3-1000) 1000 units tablet Take 1,000 Units by mouth daily.    . DULoxetine (CYMBALTA) 60 MG capsule Take 60 mg by mouth at bedtime.     Marland Kitchen ezetimibe (ZETIA) 10 MG tablet Take 10 mg by mouth daily.    . meloxicam (MOBIC) 15 MG tablet Take 15 mg by mouth as needed.     . Multiple Vitamin (MULTIVITAMIN) tablet Take 1 tablet by mouth daily.      . Omega-3 Fatty Acids (FISH OIL PO) Take 1,400 mg by mouth 2 (two) times daily.     Marland Kitchen omeprazole (PRILOSEC) 20 MG capsule Take 20 mg by mouth daily.    Marland Kitchen  polyethylene glycol (MIRALAX / GLYCOLAX) packet Take 17 g by mouth 2 (two) times daily. Hold for diarrhea     No current facility-administered medications for this visit.     REVIEW OF SYSTEMS:  [X]  denotes positive finding, [ ]  denotes negative finding Cardiac  Comments:  Chest pain or chest pressure:    Shortness of breath upon exertion:    Short of breath when lying flat:    Irregular heart rhythm:        Vascular    Pain in calf, thigh, or hip brought on by ambulation:    Pain in feet at night that wakes you up from your sleep:     Blood clot in your veins:    Leg swelling:         Pulmonary    Oxygen at home:    Productive cough:     Wheezing:         Neurologic    Sudden weakness in arms or legs:     Sudden numbness in arms or legs:     Sudden onset of difficulty speaking or slurred speech:    Temporary loss of vision in one eye:     Problems with dizziness:         Gastrointestinal    Blood in stool:     Vomited blood:           Genitourinary    Burning when urinating:     Blood in urine:        Psychiatric    Major depression:         Hematologic    Bleeding problems:    Problems with blood clotting too easily:        Skin    Rashes or ulcers:        Constitutional    Fever or chills:      PHYSICAL EXAM: Vitals:   06/12/16 1007 06/12/16 1018  BP: 134/81 138/81  Pulse: 60 60  Resp: 16   Temp: 97 F (36.1 C)   SpO2: 100%   Weight: 109 lb (49.4 kg)   Height: 5' 2.5" (1.588 m)     GENERAL: The patient is a well-nourished female, in no acute distress. The vital signs are documented above. CARDIAC: There is a regular rate and rhythm.  VASCULAR: I do not detect carotid bruits. PULMONARY: There is good air exchange bilaterally without wheezing or rales. ABDOMEN: Soft and non-tender with normal pitched bowel sounds.  MUSCULOSKELETAL: There are no major deformities or cyanosis. NEUROLOGIC: No focal weakness or paresthesias are detected. SKIN: There are no ulcers or rashes noted. PSYCHIATRIC: The patient has a normal affect.  DATA:   CAROTID DUPLEX: I have independently interpreted her carotid duplex scan from 05/22/2016 which shows a 40-59% right carotid stenosis and a 60-79% left carotid stenosis. The velocities on both sides have really not changed compared to the study on 09/27/2015. Both vertebral arteries are patent with antegrade flow.  MEDICAL ISSUES:  BILATERAL CAROTID DISEASE: This patient has an asymptomatic 60-79% left carotid stenosis and 40-59% right carotid stenosis. The velocities are stable on both sides. I ordered a follow carotid duplex scan in 6 months and we will have her see the nurse practitioner at that time. She is on aspirin and is on a statin. She is not a smoker. I have encouraged her to stay as active as possible and we have also discussed the importance of nutrition. She knows to call sooner if she  has problems.    [name] [facility]  [place] [phone]

## 2016-06-13 NOTE — Addendum Note (Signed)
Addended by: Lianne Cure A on: 06/13/2016 10:08 AM   Modules accepted: Orders

## 2016-07-10 DIAGNOSIS — M48062 Spinal stenosis, lumbar region with neurogenic claudication: Secondary | ICD-10-CM | POA: Diagnosis not present

## 2016-07-11 DIAGNOSIS — Z85820 Personal history of malignant melanoma of skin: Secondary | ICD-10-CM | POA: Diagnosis not present

## 2016-07-11 DIAGNOSIS — M1990 Unspecified osteoarthritis, unspecified site: Secondary | ICD-10-CM | POA: Diagnosis not present

## 2016-07-11 DIAGNOSIS — Y831 Surgical operation with implant of artificial internal device as the cause of abnormal reaction of the patient, or of later complication, without mention of misadventure at the time of the procedure: Secondary | ICD-10-CM | POA: Diagnosis not present

## 2016-07-11 DIAGNOSIS — Z885 Allergy status to narcotic agent status: Secondary | ICD-10-CM | POA: Diagnosis not present

## 2016-07-11 DIAGNOSIS — Z9889 Other specified postprocedural states: Secondary | ICD-10-CM | POA: Insufficient documentation

## 2016-07-11 DIAGNOSIS — Z7982 Long term (current) use of aspirin: Secondary | ICD-10-CM | POA: Diagnosis not present

## 2016-07-11 DIAGNOSIS — T8484XA Pain due to internal orthopedic prosthetic devices, implants and grafts, initial encounter: Secondary | ICD-10-CM | POA: Diagnosis not present

## 2016-07-11 DIAGNOSIS — Z79899 Other long term (current) drug therapy: Secondary | ICD-10-CM | POA: Diagnosis not present

## 2016-07-11 DIAGNOSIS — M48062 Spinal stenosis, lumbar region with neurogenic claudication: Secondary | ICD-10-CM | POA: Diagnosis not present

## 2016-07-11 DIAGNOSIS — K219 Gastro-esophageal reflux disease without esophagitis: Secondary | ICD-10-CM | POA: Diagnosis not present

## 2016-07-11 DIAGNOSIS — Z472 Encounter for removal of internal fixation device: Secondary | ICD-10-CM | POA: Diagnosis not present

## 2016-07-11 DIAGNOSIS — I10 Essential (primary) hypertension: Secondary | ICD-10-CM | POA: Diagnosis not present

## 2016-07-15 DIAGNOSIS — H43813 Vitreous degeneration, bilateral: Secondary | ICD-10-CM | POA: Diagnosis not present

## 2016-07-15 DIAGNOSIS — Z961 Presence of intraocular lens: Secondary | ICD-10-CM | POA: Diagnosis not present

## 2016-07-15 DIAGNOSIS — H35371 Puckering of macula, right eye: Secondary | ICD-10-CM | POA: Diagnosis not present

## 2016-07-15 DIAGNOSIS — H5315 Visual distortions of shape and size: Secondary | ICD-10-CM | POA: Diagnosis not present

## 2016-07-31 DIAGNOSIS — M859 Disorder of bone density and structure, unspecified: Secondary | ICD-10-CM | POA: Diagnosis not present

## 2016-08-05 DIAGNOSIS — Z681 Body mass index (BMI) 19 or less, adult: Secondary | ICD-10-CM | POA: Diagnosis not present

## 2016-08-05 DIAGNOSIS — E784 Other hyperlipidemia: Secondary | ICD-10-CM | POA: Diagnosis not present

## 2016-08-05 DIAGNOSIS — I10 Essential (primary) hypertension: Secondary | ICD-10-CM | POA: Diagnosis not present

## 2016-08-05 DIAGNOSIS — D649 Anemia, unspecified: Secondary | ICD-10-CM | POA: Diagnosis not present

## 2016-08-05 DIAGNOSIS — T148XXA Other injury of unspecified body region, initial encounter: Secondary | ICD-10-CM | POA: Diagnosis not present

## 2016-08-05 DIAGNOSIS — M810 Age-related osteoporosis without current pathological fracture: Secondary | ICD-10-CM | POA: Diagnosis not present

## 2016-08-14 DIAGNOSIS — L918 Other hypertrophic disorders of the skin: Secondary | ICD-10-CM | POA: Diagnosis not present

## 2016-08-21 DIAGNOSIS — M4714 Other spondylosis with myelopathy, thoracic region: Secondary | ICD-10-CM | POA: Diagnosis not present

## 2016-08-21 DIAGNOSIS — M48062 Spinal stenosis, lumbar region with neurogenic claudication: Secondary | ICD-10-CM | POA: Diagnosis not present

## 2016-09-04 DIAGNOSIS — M546 Pain in thoracic spine: Secondary | ICD-10-CM | POA: Diagnosis not present

## 2016-09-04 DIAGNOSIS — M48062 Spinal stenosis, lumbar region with neurogenic claudication: Secondary | ICD-10-CM | POA: Diagnosis not present

## 2016-09-09 DIAGNOSIS — M546 Pain in thoracic spine: Secondary | ICD-10-CM | POA: Diagnosis not present

## 2016-09-09 DIAGNOSIS — M48062 Spinal stenosis, lumbar region with neurogenic claudication: Secondary | ICD-10-CM | POA: Diagnosis not present

## 2016-09-11 DIAGNOSIS — M48062 Spinal stenosis, lumbar region with neurogenic claudication: Secondary | ICD-10-CM | POA: Diagnosis not present

## 2016-09-11 DIAGNOSIS — M546 Pain in thoracic spine: Secondary | ICD-10-CM | POA: Diagnosis not present

## 2016-09-18 DIAGNOSIS — M546 Pain in thoracic spine: Secondary | ICD-10-CM | POA: Diagnosis not present

## 2016-09-18 DIAGNOSIS — M48062 Spinal stenosis, lumbar region with neurogenic claudication: Secondary | ICD-10-CM | POA: Diagnosis not present

## 2016-09-20 DIAGNOSIS — M48062 Spinal stenosis, lumbar region with neurogenic claudication: Secondary | ICD-10-CM | POA: Diagnosis not present

## 2016-09-20 DIAGNOSIS — M546 Pain in thoracic spine: Secondary | ICD-10-CM | POA: Diagnosis not present

## 2016-09-24 DIAGNOSIS — M48062 Spinal stenosis, lumbar region with neurogenic claudication: Secondary | ICD-10-CM | POA: Diagnosis not present

## 2016-09-24 DIAGNOSIS — M546 Pain in thoracic spine: Secondary | ICD-10-CM | POA: Diagnosis not present

## 2016-09-25 DIAGNOSIS — M419 Scoliosis, unspecified: Secondary | ICD-10-CM | POA: Diagnosis not present

## 2016-09-25 DIAGNOSIS — Z981 Arthrodesis status: Secondary | ICD-10-CM | POA: Diagnosis not present

## 2016-09-26 DIAGNOSIS — H5310 Unspecified subjective visual disturbances: Secondary | ICD-10-CM | POA: Diagnosis not present

## 2016-09-26 DIAGNOSIS — D2312 Other benign neoplasm of skin of left eyelid, including canthus: Secondary | ICD-10-CM | POA: Diagnosis not present

## 2016-09-26 DIAGNOSIS — H35371 Puckering of macula, right eye: Secondary | ICD-10-CM | POA: Diagnosis not present

## 2016-09-26 DIAGNOSIS — H43813 Vitreous degeneration, bilateral: Secondary | ICD-10-CM | POA: Diagnosis not present

## 2016-10-01 DIAGNOSIS — M48062 Spinal stenosis, lumbar region with neurogenic claudication: Secondary | ICD-10-CM | POA: Diagnosis not present

## 2016-10-01 DIAGNOSIS — M546 Pain in thoracic spine: Secondary | ICD-10-CM | POA: Diagnosis not present

## 2016-10-03 DIAGNOSIS — M546 Pain in thoracic spine: Secondary | ICD-10-CM | POA: Diagnosis not present

## 2016-10-03 DIAGNOSIS — M48062 Spinal stenosis, lumbar region with neurogenic claudication: Secondary | ICD-10-CM | POA: Diagnosis not present

## 2016-10-07 DIAGNOSIS — M546 Pain in thoracic spine: Secondary | ICD-10-CM | POA: Diagnosis not present

## 2016-10-07 DIAGNOSIS — M48062 Spinal stenosis, lumbar region with neurogenic claudication: Secondary | ICD-10-CM | POA: Diagnosis not present

## 2016-10-10 DIAGNOSIS — M546 Pain in thoracic spine: Secondary | ICD-10-CM | POA: Diagnosis not present

## 2016-10-10 DIAGNOSIS — M48062 Spinal stenosis, lumbar region with neurogenic claudication: Secondary | ICD-10-CM | POA: Diagnosis not present

## 2016-10-15 DIAGNOSIS — M48062 Spinal stenosis, lumbar region with neurogenic claudication: Secondary | ICD-10-CM | POA: Diagnosis not present

## 2016-10-15 DIAGNOSIS — M546 Pain in thoracic spine: Secondary | ICD-10-CM | POA: Diagnosis not present

## 2016-10-16 DIAGNOSIS — M48062 Spinal stenosis, lumbar region with neurogenic claudication: Secondary | ICD-10-CM | POA: Diagnosis not present

## 2016-10-17 DIAGNOSIS — M546 Pain in thoracic spine: Secondary | ICD-10-CM | POA: Diagnosis not present

## 2016-10-17 DIAGNOSIS — M48062 Spinal stenosis, lumbar region with neurogenic claudication: Secondary | ICD-10-CM | POA: Diagnosis not present

## 2016-10-22 DIAGNOSIS — M48062 Spinal stenosis, lumbar region with neurogenic claudication: Secondary | ICD-10-CM | POA: Diagnosis not present

## 2016-10-22 DIAGNOSIS — M546 Pain in thoracic spine: Secondary | ICD-10-CM | POA: Diagnosis not present

## 2016-10-23 DIAGNOSIS — Z803 Family history of malignant neoplasm of breast: Secondary | ICD-10-CM | POA: Diagnosis not present

## 2016-10-23 DIAGNOSIS — Z1231 Encounter for screening mammogram for malignant neoplasm of breast: Secondary | ICD-10-CM | POA: Diagnosis not present

## 2016-10-24 DIAGNOSIS — R921 Mammographic calcification found on diagnostic imaging of breast: Secondary | ICD-10-CM | POA: Diagnosis not present

## 2016-10-28 ENCOUNTER — Other Ambulatory Visit: Payer: Self-pay | Admitting: Radiology

## 2016-10-28 DIAGNOSIS — N6032 Fibrosclerosis of left breast: Secondary | ICD-10-CM | POA: Diagnosis not present

## 2016-10-28 DIAGNOSIS — R921 Mammographic calcification found on diagnostic imaging of breast: Secondary | ICD-10-CM | POA: Diagnosis not present

## 2016-10-28 DIAGNOSIS — N6012 Diffuse cystic mastopathy of left breast: Secondary | ICD-10-CM | POA: Diagnosis not present

## 2016-11-01 DIAGNOSIS — M48062 Spinal stenosis, lumbar region with neurogenic claudication: Secondary | ICD-10-CM | POA: Diagnosis not present

## 2016-11-01 DIAGNOSIS — M546 Pain in thoracic spine: Secondary | ICD-10-CM | POA: Diagnosis not present

## 2016-11-05 DIAGNOSIS — M546 Pain in thoracic spine: Secondary | ICD-10-CM | POA: Diagnosis not present

## 2016-11-05 DIAGNOSIS — M48062 Spinal stenosis, lumbar region with neurogenic claudication: Secondary | ICD-10-CM | POA: Diagnosis not present

## 2016-11-07 DIAGNOSIS — M546 Pain in thoracic spine: Secondary | ICD-10-CM | POA: Diagnosis not present

## 2016-11-07 DIAGNOSIS — M48062 Spinal stenosis, lumbar region with neurogenic claudication: Secondary | ICD-10-CM | POA: Diagnosis not present

## 2016-11-12 DIAGNOSIS — M48062 Spinal stenosis, lumbar region with neurogenic claudication: Secondary | ICD-10-CM | POA: Diagnosis not present

## 2016-11-12 DIAGNOSIS — M546 Pain in thoracic spine: Secondary | ICD-10-CM | POA: Diagnosis not present

## 2016-11-14 DIAGNOSIS — M546 Pain in thoracic spine: Secondary | ICD-10-CM | POA: Diagnosis not present

## 2016-11-14 DIAGNOSIS — H35371 Puckering of macula, right eye: Secondary | ICD-10-CM | POA: Insufficient documentation

## 2016-11-14 DIAGNOSIS — M48062 Spinal stenosis, lumbar region with neurogenic claudication: Secondary | ICD-10-CM | POA: Diagnosis not present

## 2016-11-14 DIAGNOSIS — Z961 Presence of intraocular lens: Secondary | ICD-10-CM | POA: Diagnosis not present

## 2016-11-19 DIAGNOSIS — M48062 Spinal stenosis, lumbar region with neurogenic claudication: Secondary | ICD-10-CM | POA: Diagnosis not present

## 2016-11-19 DIAGNOSIS — M546 Pain in thoracic spine: Secondary | ICD-10-CM | POA: Diagnosis not present

## 2016-11-21 DIAGNOSIS — M48062 Spinal stenosis, lumbar region with neurogenic claudication: Secondary | ICD-10-CM | POA: Diagnosis not present

## 2016-11-21 DIAGNOSIS — M546 Pain in thoracic spine: Secondary | ICD-10-CM | POA: Diagnosis not present

## 2016-11-26 DIAGNOSIS — M48062 Spinal stenosis, lumbar region with neurogenic claudication: Secondary | ICD-10-CM | POA: Diagnosis not present

## 2016-11-26 DIAGNOSIS — M546 Pain in thoracic spine: Secondary | ICD-10-CM | POA: Diagnosis not present

## 2016-11-27 DIAGNOSIS — I779 Disorder of arteries and arterioles, unspecified: Secondary | ICD-10-CM | POA: Insufficient documentation

## 2016-11-28 DIAGNOSIS — M48062 Spinal stenosis, lumbar region with neurogenic claudication: Secondary | ICD-10-CM | POA: Diagnosis not present

## 2016-11-28 DIAGNOSIS — M546 Pain in thoracic spine: Secondary | ICD-10-CM | POA: Diagnosis not present

## 2016-12-03 DIAGNOSIS — M546 Pain in thoracic spine: Secondary | ICD-10-CM | POA: Diagnosis not present

## 2016-12-03 DIAGNOSIS — M48062 Spinal stenosis, lumbar region with neurogenic claudication: Secondary | ICD-10-CM | POA: Diagnosis not present

## 2016-12-05 DIAGNOSIS — M48062 Spinal stenosis, lumbar region with neurogenic claudication: Secondary | ICD-10-CM | POA: Diagnosis not present

## 2016-12-05 DIAGNOSIS — M546 Pain in thoracic spine: Secondary | ICD-10-CM | POA: Diagnosis not present

## 2016-12-10 DIAGNOSIS — M48062 Spinal stenosis, lumbar region with neurogenic claudication: Secondary | ICD-10-CM | POA: Diagnosis not present

## 2016-12-10 DIAGNOSIS — M546 Pain in thoracic spine: Secondary | ICD-10-CM | POA: Diagnosis not present

## 2016-12-11 ENCOUNTER — Encounter (HOSPITAL_COMMUNITY): Payer: Self-pay

## 2016-12-11 ENCOUNTER — Ambulatory Visit: Payer: Self-pay | Admitting: Family

## 2016-12-11 DIAGNOSIS — M48062 Spinal stenosis, lumbar region with neurogenic claudication: Secondary | ICD-10-CM | POA: Diagnosis not present

## 2016-12-12 DIAGNOSIS — M48062 Spinal stenosis, lumbar region with neurogenic claudication: Secondary | ICD-10-CM | POA: Diagnosis not present

## 2016-12-12 DIAGNOSIS — M546 Pain in thoracic spine: Secondary | ICD-10-CM | POA: Diagnosis not present

## 2016-12-16 DIAGNOSIS — I10 Essential (primary) hypertension: Secondary | ICD-10-CM | POA: Diagnosis not present

## 2016-12-16 DIAGNOSIS — H35371 Puckering of macula, right eye: Secondary | ICD-10-CM | POA: Diagnosis not present

## 2016-12-17 ENCOUNTER — Encounter (HOSPITAL_COMMUNITY): Payer: Self-pay

## 2016-12-17 ENCOUNTER — Ambulatory Visit: Payer: Self-pay | Admitting: Family

## 2016-12-26 ENCOUNTER — Encounter: Payer: Self-pay | Admitting: Family

## 2016-12-26 DIAGNOSIS — H35371 Puckering of macula, right eye: Secondary | ICD-10-CM | POA: Diagnosis not present

## 2016-12-31 ENCOUNTER — Encounter: Payer: Self-pay | Admitting: Family

## 2016-12-31 ENCOUNTER — Ambulatory Visit (INDEPENDENT_AMBULATORY_CARE_PROVIDER_SITE_OTHER): Payer: Medicare Other | Admitting: Family

## 2016-12-31 ENCOUNTER — Ambulatory Visit (HOSPITAL_COMMUNITY)
Admission: RE | Admit: 2016-12-31 | Discharge: 2016-12-31 | Disposition: A | Discharge status: Home / Self Care | Payer: Medicare Other | Source: Ambulatory Visit | Attending: Vascular Surgery | Admitting: Vascular Surgery

## 2016-12-31 VITALS — BP 173/85 | HR 67 | Temp 97.8°F | Resp 18 | Ht 62.5 in | Wt 112.0 lb

## 2016-12-31 DIAGNOSIS — I6523 Occlusion and stenosis of bilateral carotid arteries: Secondary | ICD-10-CM | POA: Diagnosis not present

## 2016-12-31 LAB — VAS US CAROTID
LEFT ECA DIAS: -7 cm/s
LEFT VERTEBRAL DIAS: -18 cm/s
Left CCA dist dias: 28 cm/s
Left CCA dist sys: 96 cm/s
Left CCA prox dias: 26 cm/s
Left CCA prox sys: 101 cm/s
Left ICA dist dias: -22 cm/s
Left ICA dist sys: -75 cm/s
Left ICA prox dias: -61 cm/s
Left ICA prox sys: -196 cm/s
RIGHT CCA MID DIAS: 24 cm/s
RIGHT ECA DIAS: -9 cm/s
RIGHT VERTEBRAL DIAS: 24 cm/s
Right CCA prox dias: 19 cm/s
Right CCA prox sys: 98 cm/s
Right cca dist sys: -73 cm/s

## 2016-12-31 NOTE — Progress Notes (Signed)
Chief Complaint: Follow up Extracranial Carotid Artery Stenosis   History of Present Illness  Shannon Berger is a 76 y.o. female whom Dr. [name] has been monitoring for bilateral extracranial carotid artery stenosis has known carotid stenosis.  Patient has not had previous carotid artery intervention.  She denies any known history of stroke or TIA. Specifically she denies a history of amaurosis fugax or monocular blindness (other than the afore mentioned), unilateral facial drooping, hemiplegia, or receptive or expressive aphasia.    Right eye vision issue started right after right eye surgery on 12-16-16 for wrinkled retina, pt states this is slowly improving.  Letter enclosed with pt chart from her eye surgeon, Dr. Cordelia Pen, states that he is "concerned that there may have been an AiON or remote vascular occlusion as  she has definitive carotid artery disease. Her blind spot seems to be improving but still CF vision is so atypical."  Pt Diabetic: no Pt smoker: non-smoker  Pt meds include: Statin : yes ASA: yes Other anticoagulants/antiplatelets: no   Past Medical History:  Diagnosis Date  . Arthritis   . Carotid arterial disease ([place])   . Constipation   . Degeneration of lumbar intervertebral disc   . Dislocation of hip joint prosthesis ([place])   . Fusion of spine of lumbar region   . Hyperlipidemia   . Hypertension   . Low back pain    Radiating to left leg  . Osteoarthritis   . Osteopenia   . Sleep difficulties   . Spinal stenosis at L4-L5 level     Social History Social History  Substance Use Topics  . Smoking status: Never Smoker  . Smokeless tobacco: Never Used  . Alcohol use 0.0 oz/week     Comment: 1 gl wine biweekly    Family History Family History  Problem Relation Age of Onset  . Heart failure Mother   . Cancer Mother   . Hypertension Mother   . Other Mother        Varicose Veins  . Diabetes Father   . Heart disease Father   .  Hypertension Father   . Heart attack Father     Surgical History Past Surgical History:  Procedure Laterality Date  . BACK SURGERY    . BUNIONECTOMY    . EYE SURGERY    . FOOT SURGERY     Status post right foot surgery  . HAND FUSION     Right thumb  . JOINT REPLACEMENT Left 2014   Hip  . LUMBAR FUSION     Anterior and posterior  . OTHER SURGICAL HISTORY     post fusion of her right thumb  . PILONIDAL CYST / SINUS EXCISION    . TENDON REPAIR     left hip  . TENDON REPAIR     Reattachment of tendon- Left hip  . TONSILLECTOMY AND ADENOIDECTOMY    . TOTAL HIP ARTHROPLASTY Left 09/04/2012   Procedure: LEFT TOTAL HIP ARTHROPLASTY ANTERIOR APPROACH;  Surgeon: Mcarthur Rossetti, MD;  Location: [facility];  Service: Orthopedics;  Laterality: Left;  Left Total Hip Arthroplasty, Anterior Approach    Allergies  Allergen Reactions  . Celebrex [Celecoxib]     Current Outpatient Prescriptions  Medication Sig Dispense Refill  . acetaminophen (TYLENOL) 500 MG tablet Take 325 mg by mouth.     . ALPRAZolam (XANAX) 0.25 MG tablet Take 0.125 mg by mouth 3 (three) times daily as needed. Take 1/2 tablet po PRN QHS insomnia or anxiety    .  Ascorbic Acid (VITAMIN C PO) Take 1,000 mg by mouth at bedtime.     Marland Kitchen aspirin EC 81 MG tablet Take 81 mg by mouth at bedtime.     Marland Kitchen atorvastatin (LIPITOR) 40 MG tablet Take 40 mg by mouth daily.  3  . bisoprolol-hydrochlorothiazide (ZIAC) 2.5-6.25 MG per tablet Take 1 tablet by mouth every morning.     Marland Kitchen buPROPion (WELLBUTRIN XL) 150 MG 24 hr tablet Take 150 mg by mouth daily.  3  . calcium carbonate (OS-CAL) 600 MG tablet Take 600 mg by mouth daily.    . Cholecalciferol (D3-1000) 1000 units tablet Take 1,000 Units by mouth daily.    . DULoxetine (CYMBALTA) 60 MG capsule Take 60 mg by mouth at bedtime.     Marland Kitchen ezetimibe (ZETIA) 10 MG tablet Take 10 mg by mouth daily.    . meloxicam (MOBIC) 15 MG tablet Take 15 mg by mouth as needed.     . Multiple Vitamin  (MULTIVITAMIN) tablet Take 1 tablet by mouth daily.      . Omega-3 Fatty Acids (FISH OIL PO) Take 1,400 mg by mouth 2 (two) times daily.     Marland Kitchen omeprazole (PRILOSEC) 20 MG capsule Take 20 mg by mouth daily.    Marland Kitchen senna (SENOKOT) 8.6 MG TABS tablet Take 1 tablet by mouth at bedtime as needed for mild constipation.    . polyethylene glycol (MIRALAX / GLYCOLAX) packet Take 17 g by mouth 2 (two) times daily. Hold for diarrhea     No current facility-administered medications for this visit.     Review of Systems : See HPI for pertinent positives and negatives.  Physical Examination  Vitals:   12/31/16 1156 12/31/16 1158  BP: (!) 159/81 (!) 173/85  Pulse: 67   Resp: 18   Temp: 97.8 F (36.6 C)   TempSrc: Oral   SpO2: 99%   Weight: 112 lb (50.8 kg)   Height: 5' 2.5" (1.588 m)    Body mass index is 20.16 kg/m.  General: WDWN female in NAD GAIT: normal Eyes: PERRLA Pulmonary:  Respirations are non-labored, good air movement, CTAB, no rales, rhonchi, or wheezing.  Cardiac: regular rhythm, no detected murmur.  VASCULAR EXAM Carotid Bruits Right Left   Negative Negative    Aorta is not palpable. Radial pulses are 2+ palpable and equal.                                                                                                                            LE Pulses Right Left       POPLITEAL  not palpable   not palpable       POSTERIOR TIBIAL   palpable    palpable        DORSALIS PEDIS      ANTERIOR TIBIAL not palpable  not palpable     Gastrointestinal: soft, nontender, BS WNL, no r/g, no palpable masses.  Musculoskeletal: No muscle atrophy/wasting. M/S 5/5 throughout,  extremities without ischemic changes.  Neurologic:  A&O X 3; appropriate affect, sensation is normal; speech is normal, CN 2-12 intact, pain and light touch intact in extremities, motor exam as listed above.    Assessment: Shannon Berger is a 76 y.o. female who has no history of stroke or  TIA. After right retinal surgery on 12-16-16, she developed a blind spot in her right eye vision which seems to be slowly resolving.   I discussed with Dr. Donnetta Hutching pt and her eye surgeon's inquiry as to whether her carotid disease would contribute to the aforementioned blind spot.  I will communicate with Dr. [name] re this.   DATA Carotid Duplex (12/31/16): Right ICA: 40-59% stenosis. Left ICA: 60-79% stenosis. Bilateral vertebral artery flow is antegrade.  Bilateral subclavian artery waveforms are normal.  No significant change compared to the last exam on 05-22-16.    Plan: Follow-up in 6 months with Carotid Duplex scan.   I discussed in depth with the patient the nature of atherosclerosis, and emphasized the importance of maximal medical management including strict control of blood pressure, blood glucose, and lipid levels, obtaining regular exercise, and continued cessation of smoking.  The patient is aware that without maximal medical management the underlying atherosclerotic disease process will progress, limiting the benefit of any interventions. The patient was given information about stroke prevention and what symptoms should prompt the patient to seek immediate medical care. Thank you for allowing [place] to participate in this patient's care.  Clemon Chambers, RN, MSN, FNP-C [facility] [place] Office: [phone]  Clinic Physician: Early  12/31/16 12:00 PM

## 2016-12-31 NOTE — Patient Instructions (Addendum)
Stroke Prevention Some medical conditions and behaviors are associated with an increased chance of having a stroke. You may prevent a stroke by making healthy choices and managing medical conditions. How can I reduce my risk of having a stroke?  Stay physically active. Get at least 30 minutes of activity on most or all days.  Do not smoke. It may also be helpful to avoid exposure to secondhand smoke.  Limit alcohol use. Moderate alcohol use is considered to be: ? No more than 2 drinks per day for men. ? No more than 1 drink per day for nonpregnant women.  Eat healthy foods. This involves: ? Eating 5 or more servings of fruits and vegetables a day. ? Making dietary changes that address high blood pressure (hypertension), high cholesterol, diabetes, or obesity.  Manage your cholesterol levels. ? Making food choices that are high in fiber and low in saturated fat, trans fat, and cholesterol may control cholesterol levels. ? Take any prescribed medicines to control cholesterol as directed by your health care provider.  Manage your diabetes. ? Controlling your carbohydrate and sugar intake is recommended to manage diabetes. ? Take any prescribed medicines to control diabetes as directed by your health care provider.  Control your hypertension. ? Making food choices that are low in salt (sodium), saturated fat, trans fat, and cholesterol is recommended to manage hypertension. ? Ask your health care provider if you need treatment to lower your blood pressure. Take any prescribed medicines to control hypertension as directed by your health care provider. ? If you are 18-39 years of age, have your blood pressure checked every 3-5 years. If you are 40 years of age or older, have your blood pressure checked every year.  Maintain a healthy weight. ? Reducing calorie intake and making food choices that are low in sodium, saturated fat, trans fat, and cholesterol are recommended to manage  weight.  Stop drug abuse.  Avoid taking birth control pills. ? Talk to your health care provider about the risks of taking birth control pills if you are over 35 years old, smoke, get migraines, or have ever had a blood clot.  Get evaluated for sleep disorders (sleep apnea). ? Talk to your health care provider about getting a sleep evaluation if you snore a lot or have excessive sleepiness.  Take medicines only as directed by your health care provider. ? For some people, aspirin or blood thinners (anticoagulants) are helpful in reducing the risk of forming abnormal blood clots that can lead to stroke. If you have the irregular heart rhythm of atrial fibrillation, you should be on a blood thinner unless there is a good reason you cannot take them. ? Understand all your medicine instructions.  Make sure that other conditions (such as anemia or atherosclerosis) are addressed. Get help right away if:  You have sudden weakness or numbness of the face, arm, or leg, especially on one side of the body.  Your face or eyelid droops to one side.  You have sudden confusion.  You have trouble speaking (aphasia) or understanding.  You have sudden trouble seeing in one or both eyes.  You have sudden trouble walking.  You have dizziness.  You have a loss of balance or coordination.  You have a sudden, severe headache with no known cause.  You have new chest pain or an irregular heartbeat. Any of these symptoms may represent a serious problem that is an emergency. Do not wait to see if the symptoms will go away.   Get medical help at once. Call your local emergency services (911 in [place] Do not drive yourself to the hospital. This information is not intended to replace advice given to you by your health care provider. Make sure you discuss any questions you have with your health care provider. Document Released: 08/01/2004 Document Revised: 11/30/2015 Document Reviewed: 12/25/2012 Elsevier  Interactive Patient Education  2017 [employer] Inc.     Preventing Cerebrovascular Disease Arteries are blood vessels that carry blood that contains oxygen from the heart to all parts of the body. Cerebrovascular disease affects arteries that supply the brain. Any condition that blocks or disrupts blood flow to the brain can cause cerebrovascular disease. Brain cells that lose blood supply start to die within minutes (stroke). Stroke is the main danger of cerebrovascular disease. Atherosclerosis and high blood pressure are common causes of cerebrovascular disease. Atherosclerosis is narrowing and hardening of an artery that results when fat, cholesterol, calcium, or other substances (plaque) build up inside an artery. Plaque reduces blood flow through the artery. High blood pressure increases the risk of bleeding inside the brain. Making diet and lifestyle changes to prevent atherosclerosis and high blood pressure lowers your risk of cerebrovascular disease. What nutrition changes can be made?  Eat more fruits, vegetables, and whole grains.  Reduce how much saturated fat you eat. To do this, eat less red meat and fewer full-fat dairy products.  Eat healthy proteins instead of red meat. Healthy proteins include: ? Fish. Eat fish that contains heart-healthy omega-3 fatty acids, twice a week. Examples include salmon, albacore tuna, mackerel, and herring. ? Chicken. ? Nuts. ? Low-fat or nonfat yogurt.  Avoid processed meats, like bacon and lunchmeat.  Avoid foods that contain: ? A lot of sugar, such as sweets and drinks with added sugar. ? A lot of salt (sodium). Avoid adding extra salt to your food, as told by your health care provider. ? Trans fats, such as margarine and baked goods. Trans fats may be listed as "partially hydrogenated oils" on food labels.  Check food labels to see how much sodium, sugar, and trans fats are in foods.  Use vegetable oils that contain low amounts of  saturated fat, such as olive oil or canola oil. What lifestyle changes can be made?  Drink alcohol in moderation. This means no more than 1 drink a day for nonpregnant women and 2 drinks a day for men. One drink equals 12 oz of beer, 5 oz of wine, or 1 oz of hard liquor.  If you are overweight, ask your health care provider to recommend a weight-loss plan for you. Losing 5-10 lb (2.2-4.5 kg) can reduce your risk of diabetes, atherosclerosis, and high blood pressure.  Exercise for 30?60 minutes on most days, or as much as told by your health care provider. ? Do moderate-intensity exercise, such as brisk walking, bicycling, and water aerobics. Ask your health care provider which activities are safe for you.  Do not use any products that contain nicotine or tobacco, such as cigarettes and e-cigarettes. If you need help quitting, ask your health care provider. Why are these changes important? Making these changes lowers your risk of many diseases that can cause cerebrovascular disease and stroke. Stroke is a leading cause of death and disability. Making these changes also improves your overall health and quality of life. What can I do to lower my risk? The following factors make you more likely to develop cerebrovascular disease:  Being overweight.  Smoking.  Being physically inactive.    Eating a high-fat diet.  Having certain health conditions, such as: ? Diabetes. ? High blood pressure. ? Heart disease. ? Atherosclerosis. ? High cholesterol. ? Sickle cell disease.  Talk with your health care provider about your risk for cerebrovascular disease. Work with your health care provider to control diseases that you have that may contribute to cerebrovascular disease. Your health care provider may prescribe medicines to help prevent major causes of cerebrovascular disease. Where to find more information: Learn more about preventing cerebrovascular disease from:  National Heart, Lung, and  Blood Institute: www.nhlbi.nih.gov/health/health-topics/topics/stroke  Centers for Disease Control and Prevention: cdc.gov/stroke/about.htm  Summary  Cerebrovascular disease can lead to a stroke.  Atherosclerosis and high blood pressure are major causes of cerebrovascular disease.  Making diet and lifestyle changes can reduce your risk of cerebrovascular disease.  Work with your health care provider to get your risk factors under control to reduce your risk of cerebrovascular disease. This information is not intended to replace advice given to you by your health care provider. Make sure you discuss any questions you have with your health care provider. Document Released: 07/09/2015 Document Revised: 01/12/2016 Document Reviewed: 07/09/2015 Elsevier Interactive Patient Education  2018 Elsevier Inc.  

## 2017-01-07 ENCOUNTER — Encounter (HOSPITAL_COMMUNITY): Payer: Medicare Other

## 2017-01-07 ENCOUNTER — Ambulatory Visit: Payer: Medicare Other | Admitting: Family

## 2017-01-27 ENCOUNTER — Encounter: Payer: Self-pay | Admitting: Vascular Surgery

## 2017-01-29 DIAGNOSIS — L821 Other seborrheic keratosis: Secondary | ICD-10-CM | POA: Diagnosis not present

## 2017-01-29 DIAGNOSIS — D692 Other nonthrombocytopenic purpura: Secondary | ICD-10-CM | POA: Diagnosis not present

## 2017-01-29 DIAGNOSIS — L84 Corns and callosities: Secondary | ICD-10-CM | POA: Diagnosis not present

## 2017-01-29 DIAGNOSIS — L820 Inflamed seborrheic keratosis: Secondary | ICD-10-CM | POA: Diagnosis not present

## 2017-02-05 ENCOUNTER — Encounter: Payer: Self-pay | Admitting: Vascular Surgery

## 2017-02-05 ENCOUNTER — Ambulatory Visit (INDEPENDENT_AMBULATORY_CARE_PROVIDER_SITE_OTHER): Payer: Medicare Other | Admitting: Vascular Surgery

## 2017-02-05 VITALS — BP 152/76 | HR 59 | Temp 97.0°F | Resp 16 | Ht 62.5 in | Wt 114.6 lb

## 2017-02-05 DIAGNOSIS — I6523 Occlusion and stenosis of bilateral carotid arteries: Secondary | ICD-10-CM

## 2017-02-05 DIAGNOSIS — M810 Age-related osteoporosis without current pathological fracture: Secondary | ICD-10-CM | POA: Diagnosis not present

## 2017-02-05 DIAGNOSIS — E784 Other hyperlipidemia: Secondary | ICD-10-CM | POA: Diagnosis not present

## 2017-02-05 DIAGNOSIS — D539 Nutritional anemia, unspecified: Secondary | ICD-10-CM | POA: Diagnosis not present

## 2017-02-05 DIAGNOSIS — D538 Other specified nutritional anemias: Secondary | ICD-10-CM | POA: Diagnosis not present

## 2017-02-05 DIAGNOSIS — I10 Essential (primary) hypertension: Secondary | ICD-10-CM | POA: Diagnosis not present

## 2017-02-05 NOTE — Progress Notes (Signed)
Patient name: Shannon Berger MRN: [id number] DOB: September 08, 1940 Sex: female  REASON FOR VISIT:    Bilateral carotid disease  HPI:   Shannon Berger is a pleasant 76 y.o. female who was seen by our nurse practitioner on 12/31/2016. She has known bilateral carotid disease. At the time of her visit, the right carotid stenosis was in the 40-59% range. The left carotid stenosis was in the 60-79% change. There had been no change in this study compared to the study done 05/22/2016. The patient had surgery on the right eye on 12/16/2016. The patient was having some visual disturbances in the right eye which began after surgery but this was slowly improving. Our office later received a letter from Dr. Cordelia Pen stating that they were concerned that there might be a remote vascular occlusion given her history of carotid disease. Therefore the patient was set up to see me to discuss this further.  On my history, the patient denies any history of stroke, TIAs, expressive or receptive aphasia. I do not get any clear-cut history of amaurosis fugax. After the surgery in the right eye, the following day, the patient had very little vision in the right eye and was also noted to have some hemorrhage. This has improved as has her vision.   She is on aspirin and is on a statin.  Past Medical History:  Diagnosis Date  . Arthritis   . Carotid arterial disease ([place])   . Constipation   . Degeneration of lumbar intervertebral disc   . Dislocation of hip joint prosthesis ([place])   . Fusion of spine of lumbar region   . Hyperlipidemia   . Hypertension   . Low back pain    Radiating to left leg  . Osteoarthritis   . Osteopenia   . Sleep difficulties   . Spinal stenosis at L4-L5 level     Family History  Problem Relation Age of Onset  . Heart failure Mother   . Cancer Mother   . Hypertension Mother   . Other Mother        Varicose Veins  . Diabetes Father   . Heart disease Father   . Hypertension  Father   . Heart attack Father     SOCIAL HISTORY: Social History  Substance Use Topics  . Smoking status: Never Smoker  . Smokeless tobacco: Never Used  . Alcohol use 0.0 oz/week     Comment: 1 gl wine biweekly    Allergies  Allergen Reactions  . Celebrex [Celecoxib]     Pt does not remember if this is truly an allergy.  . Hydrocodone   . Oxycodone     Current Outpatient Prescriptions  Medication Sig Dispense Refill  . acetaminophen (TYLENOL) 500 MG tablet Take 325 mg by mouth.     . ALPRAZolam (XANAX) 0.25 MG tablet Take 0.125 mg by mouth at bedtime as needed. Take 1/2 tablet po PRN QHS insomnia or anxiety    . Ascorbic Acid (VITAMIN C PO) Take 1,000 mg by mouth at bedtime.     Marland Kitchen aspirin EC 81 MG tablet Take 81 mg by mouth at bedtime.     Marland Kitchen atorvastatin (LIPITOR) 40 MG tablet Take 40 mg by mouth daily.  3  . bisoprolol-hydrochlorothiazide (ZIAC) 2.5-6.25 MG per tablet Take 1 tablet by mouth every morning.     Marland Kitchen buPROPion (WELLBUTRIN XL) 150 MG 24 hr tablet Take 150 mg by mouth daily.  3  . calcium carbonate (OS-CAL) 600 MG tablet  Take 600 mg by mouth daily.    . Cholecalciferol (D3-1000) 1000 units tablet Take 1,000 Units by mouth daily.    . DULoxetine (CYMBALTA) 60 MG capsule Take 60 mg by mouth at bedtime.     Marland Kitchen ezetimibe (ZETIA) 10 MG tablet Take 10 mg by mouth daily.    . meloxicam (MOBIC) 15 MG tablet Take 15 mg by mouth as needed.     . Multiple Vitamin (MULTIVITAMIN) tablet Take 1 tablet by mouth daily.      . Omega-3 Fatty Acids (FISH OIL PO) Take 1,400 mg by mouth 2 (two) times daily.     Marland Kitchen omeprazole (PRILOSEC) 20 MG capsule Take 20 mg by mouth daily.    . polyethylene glycol (MIRALAX / GLYCOLAX) packet Take 17 g by mouth 2 (two) times daily. Hold for diarrhea    . senna (SENOKOT) 8.6 MG TABS tablet Take 1 tablet by mouth at bedtime as needed for mild constipation.     No current facility-administered medications for this visit.     REVIEW OF SYSTEMS:  [X]   denotes positive finding, [ ]  denotes negative finding Cardiac  Comments:  Chest pain or chest pressure:    Shortness of breath upon exertion:    Short of breath when lying flat:    Irregular heart rhythm:        Vascular    Pain in calf, thigh, or hip brought on by ambulation:    Pain in feet at night that wakes you up from your sleep:     Blood clot in your veins:    Leg swelling:         Pulmonary    Oxygen at home:    Productive cough:     Wheezing:         Neurologic    Sudden weakness in arms or legs:     Sudden numbness in arms or legs:     Sudden onset of difficulty speaking or slurred speech:    Temporary loss of vision in one eye:     Problems with dizziness:         Gastrointestinal    Blood in stool:     Vomited blood:         Genitourinary    Burning when urinating:     Blood in urine:        Psychiatric    Major depression:         Hematologic    Bleeding problems:    Problems with blood clotting too easily:        Skin    Rashes or ulcers:        Constitutional    Fever or chills:     PHYSICAL EXAM:   Vitals:   02/05/17 0921 02/05/17 0926  BP: (!) 152/83 (!) 152/76  Pulse: (!) 59   Resp: 16   Temp: (!) 97 F (36.1 C)   TempSrc: Oral   SpO2: 100%   Weight: 114 lb 9.6 oz (52 kg)   Height: 5' 2.5" (1.588 m)     GENERAL: The patient is a well-nourished female, in no acute distress. The vital signs are documented above. CARDIAC: There is a regular rate and rhythm.  VASCULAR: I do not detect carotid bruits. PULMONARY: There is good air exchange bilaterally without wheezing or rales. NEUROLOGIC: No focal weakness or paresthesias are detected. SKIN: There are no ulcers or rashes noted. PSYCHIATRIC: The patient has a normal affect.  DATA:  CAROTID DUPLEX: I have reviewed the carotid duplex scan that was done on 12/31/2016.  On the right side there is a 40-59% stenosis. Peak systolic velocity is 888 cm percent and with an end-diastolic  velocity 47 cm/s. Based on these velocities, I will believe the stenosis is in the lower end of that range.  On the left side, there was a 60-79% stenosis. Peak systolic velocity on the left was 185 cm/s with an end-diastolic velocity of 65 cm/s. This also would suggest that the stenosis is in the lower end of that range.  Both vertebral arteries are patent with antegrade flow.  MEDICAL ISSUES:   BILATERAL CAROTID DISEASE: I do not think that the visual disturbance after surgery can be attributed to the mild right carotid stenosis. This stenosis is noted to be approximately 40%. Her visual disturbance is improving and hopefully this will continue. She is scheduled to see [place] back in 6 months for follow up duplex scan. She is on aspirin and is on a statin. She knows to call sooner if she has problems.  [name] [facility] [phone]

## 2017-02-06 DIAGNOSIS — H47011 Ischemic optic neuropathy, right eye: Secondary | ICD-10-CM | POA: Insufficient documentation

## 2017-02-06 DIAGNOSIS — H35371 Puckering of macula, right eye: Secondary | ICD-10-CM | POA: Diagnosis not present

## 2017-02-10 DIAGNOSIS — I6529 Occlusion and stenosis of unspecified carotid artery: Secondary | ICD-10-CM | POA: Diagnosis not present

## 2017-02-10 DIAGNOSIS — M810 Age-related osteoporosis without current pathological fracture: Secondary | ICD-10-CM | POA: Diagnosis not present

## 2017-02-10 DIAGNOSIS — I2510 Atherosclerotic heart disease of native coronary artery without angina pectoris: Secondary | ICD-10-CM | POA: Diagnosis not present

## 2017-02-10 DIAGNOSIS — Z0000 Encounter for general adult medical examination without abnormal findings: Secondary | ICD-10-CM | POA: Diagnosis not present

## 2017-02-11 DIAGNOSIS — Z1212 Encounter for screening for malignant neoplasm of rectum: Secondary | ICD-10-CM | POA: Diagnosis not present

## 2017-02-20 DIAGNOSIS — Q078 Other specified congenital malformations of nervous system: Secondary | ICD-10-CM | POA: Diagnosis not present

## 2017-02-20 DIAGNOSIS — H5461 Unqualified visual loss, right eye, normal vision left eye: Secondary | ICD-10-CM | POA: Diagnosis not present

## 2017-02-20 DIAGNOSIS — H47011 Ischemic optic neuropathy, right eye: Secondary | ICD-10-CM | POA: Diagnosis not present

## 2017-02-20 DIAGNOSIS — H3589 Other specified retinal disorders: Secondary | ICD-10-CM | POA: Diagnosis not present

## 2017-02-20 DIAGNOSIS — H35371 Puckering of macula, right eye: Secondary | ICD-10-CM | POA: Diagnosis not present

## 2017-02-24 ENCOUNTER — Ambulatory Visit: Payer: Medicare Other | Admitting: Podiatry

## 2017-02-26 ENCOUNTER — Encounter: Payer: Self-pay | Admitting: Podiatry

## 2017-02-26 ENCOUNTER — Ambulatory Visit (INDEPENDENT_AMBULATORY_CARE_PROVIDER_SITE_OTHER): Payer: Medicare Other | Admitting: Podiatry

## 2017-02-26 DIAGNOSIS — B351 Tinea unguium: Secondary | ICD-10-CM

## 2017-02-26 DIAGNOSIS — M21611 Bunion of right foot: Secondary | ICD-10-CM

## 2017-02-26 DIAGNOSIS — L84 Corns and callosities: Secondary | ICD-10-CM

## 2017-02-26 DIAGNOSIS — M2041 Other hammer toe(s) (acquired), right foot: Secondary | ICD-10-CM | POA: Diagnosis not present

## 2017-02-26 NOTE — Progress Notes (Signed)
   Subjective: Patient presents to the office today for chief complaint of painful callus lesions of the feet. Patient states that the pain is ongoing and is affecting their ability to ambulate without pain. Patient presents today for further treatment and evaluation. Patient also complains of a bunion deformity with hammertoe to the second digit of the right foot. Patient had bunion surgery several years ago however the bunion has recurred. At the moment it is not symptomatic. Patient also has an additional complaint regarding dystrophic nails the bilateral great toes. Patient is unable to take oral Lamisil due to elevated liver enzymes. Patient elected discuss different treatment options regarding the bilateral great toenails  Objective:  Physical Exam General: Alert and oriented x3 in no acute distress  Dermatology: Hyperkeratotic lesion present on the plantar aspect of the right foot 2. Pain on palpation with a central nucleated core noted.  Skin is warm, dry and supple bilateral lower extremities. Negative for open lesions or macerations. Hyperkeratotic dystrophic toenails also noted to the bilateral great toes likely consistent with onychomycosis.  Vascular: Palpable pedal pulses bilaterally. No edema or erythema noted. Capillary refill within normal limits.  Neurological: Epicritic and protective threshold grossly intact bilaterally.   Musculoskeletal Exam: Pain on palpation at the keratotic lesion noted. Range of motion within normal limits bilateral. Muscle strength 5/5 in all groups bilateral.  Assessment: #1 symptomatic callus lesions right plantar forefoot times #2 onychomycosis bilateral great toenails #3 hallux abductovalgus bunion deformity right with hammertoe contracture digits right foot   Plan of Care:  #1 Patient evaluated #2 Excisional debridement of keratoic lesion using a chisel blade was performed without incident.  #3 recommend urea 40% cream and a pumice stone  daily to callus lesions #4 today we discussed different treatment modalities regarding fungal nails. The patient would like to possibly pursue laser treatment. Today we will set up an appointment with Janett Billow, heart for antifungal nail laser #5 today the bunion hammertoe deformity is not symptomatic. Pursue conservative treatment including shoe gear and wide shoes. #6 return to clinic when necessary  Edrick Kins, DPM [facility]  Dr. Edrick Kins, [address]                                        [place], [place] [address]                Office [phone]  Fax [phone]

## 2017-03-04 ENCOUNTER — Ambulatory Visit (INDEPENDENT_AMBULATORY_CARE_PROVIDER_SITE_OTHER): Payer: Self-pay | Admitting: Podiatry

## 2017-03-04 DIAGNOSIS — B351 Tinea unguium: Secondary | ICD-10-CM

## 2017-03-04 NOTE — Progress Notes (Signed)
Pt presents with mycotic infection of nails Rt 1 and Lt 1, 3,4,5 All other systems are negative  Laser therapy administered to affected nails and tolerated well. All safety precautions were in place. Re-appointed in 4 weeks for 4th treatment

## 2017-03-14 ENCOUNTER — Encounter: Payer: Self-pay | Admitting: Obstetrics & Gynecology

## 2017-03-14 ENCOUNTER — Ambulatory Visit (INDEPENDENT_AMBULATORY_CARE_PROVIDER_SITE_OTHER): Payer: Medicare Other | Admitting: Obstetrics & Gynecology

## 2017-03-14 VITALS — BP 128/78 | Ht 62.0 in | Wt 113.8 lb

## 2017-03-14 DIAGNOSIS — Z01419 Encounter for gynecological examination (general) (routine) without abnormal findings: Secondary | ICD-10-CM

## 2017-03-14 DIAGNOSIS — Z780 Asymptomatic menopausal state: Secondary | ICD-10-CM | POA: Diagnosis not present

## 2017-03-14 DIAGNOSIS — N9089 Other specified noninflammatory disorders of vulva and perineum: Secondary | ICD-10-CM | POA: Diagnosis not present

## 2017-03-14 DIAGNOSIS — Z124 Encounter for screening for malignant neoplasm of cervix: Secondary | ICD-10-CM | POA: Diagnosis not present

## 2017-03-14 NOTE — Patient Instructions (Addendum)
1. Encounter for routine gynecological examination with Papanicolaou smear of cervix Normal gyn exam in Menopause.  Pap reflex done.  Breasts wnl.  Screening mammo normal 2018.  2. Menopause present No HRT.  No PMB.  Risk of Osteoporosis.  Vit D supplements/Ca++ in nutrition.  Weight bearing physical activity.  Schedule Bone Density.  3. Vulvar lesion 2 small hemangiomas on right vulva.  Reassured.  4. Screening for malignant neoplasm of cervix  - Pap IG w/ reflex to HPV when [facility]  Shannon Berger, it was a pleasure to see you today!  I will inform you of your results as soon as available.   Health Maintenance for Postmenopausal Women Menopause is a normal process in which your reproductive ability comes to an end. This process happens gradually over a span of months to years, usually between the ages of 12 and 65. Menopause is complete when you have missed 12 consecutive menstrual periods. It is important to talk with your health care provider about some of the most common conditions that affect postmenopausal women, such as heart disease, cancer, and bone loss (osteoporosis). Adopting a healthy lifestyle and getting preventive care can help to promote your health and wellness. Those actions can also lower your chances of developing some of these common conditions. What should I know about menopause? During menopause, you may experience a number of symptoms, such as:  Moderate-to-severe hot flashes.  Night sweats.  Decrease in sex drive.  Mood swings.  Headaches.  Tiredness.  Irritability.  Memory problems.  Insomnia.  Choosing to treat or not to treat menopausal changes is an individual decision that you make with your health care provider. What should I know about hormone replacement therapy and supplements? Hormone therapy products are effective for treating symptoms that are associated with menopause, such as hot flashes and night sweats. Hormone replacement carries certain  risks, especially as you become older. If you are thinking about using estrogen or estrogen with progestin treatments, discuss the benefits and risks with your health care provider. What should I know about heart disease and stroke? Heart disease, heart attack, and stroke become more likely as you age. This may be due, in part, to the hormonal changes that your body experiences during menopause. These can affect how your body processes dietary fats, triglycerides, and cholesterol. Heart attack and stroke are both medical emergencies. There are many things that you can do to help prevent heart disease and stroke:  Have your blood pressure checked at least every 1-2 years. High blood pressure causes heart disease and increases the risk of stroke.  If you are 34-77 years old, ask your health care provider if you should take aspirin to prevent a heart attack or a stroke.  Do not use any tobacco products, including cigarettes, chewing tobacco, or electronic cigarettes. If you need help quitting, ask your health care provider.  It is important to eat a healthy diet and maintain a healthy weight. ? Be sure to include plenty of vegetables, fruits, low-fat dairy products, and lean protein. ? Avoid eating foods that are high in solid fats, added sugars, or salt (sodium).  Get regular exercise. This is one of the most important things that you can do for your health. ? Try to exercise for at least 150 minutes each week. The type of exercise that you do should increase your heart rate and make you sweat. This is known as moderate-intensity exercise. ? Try to do strengthening exercises at least twice each week. Do these  in addition to the moderate-intensity exercise.  Know your numbers.Ask your health care provider to check your cholesterol and your blood glucose. Continue to have your blood tested as directed by your health care provider.  What should I know about cancer screening? There are several types  of cancer. Take the following steps to reduce your risk and to catch any cancer development as early as possible. Breast Cancer  Practice breast self-awareness. ? This means understanding how your breasts normally appear and feel. ? It also means doing regular breast self-exams. Let your health care provider know about any changes, no matter how small.  If you are 57 or older, have a clinician do a breast exam (clinical breast exam or CBE) every year. Depending on your age, family history, and medical history, it may be recommended that you also have a yearly breast X-ray (mammogram).  If you have a family history of breast cancer, talk with your health care provider about genetic screening.  If you are at high risk for breast cancer, talk with your health care provider about having an MRI and a mammogram every year.  Breast cancer (BRCA) gene test is recommended for women who have family members with BRCA-related cancers. Results of the assessment will determine the need for genetic counseling and BRCA1 and for BRCA2 testing. BRCA-related cancers include these types: ? Breast. This occurs in males or females. ? Ovarian. ? Tubal. This may also be called fallopian tube cancer. ? Cancer of the abdominal or pelvic lining (peritoneal cancer). ? Prostate. ? Pancreatic.  Cervical, Uterine, and Ovarian Cancer Your health care provider may recommend that you be screened regularly for cancer of the pelvic organs. These include your ovaries, uterus, and vagina. This screening involves a pelvic exam, which includes checking for microscopic changes to the surface of your cervix (Pap test).  For women ages 21-65, health care providers may recommend a pelvic exam and a Pap test every three years. For women ages 9-65, they may recommend the Pap test and pelvic exam, combined with testing for human papilloma virus (HPV), every five years. Some types of HPV increase your risk of cervical cancer. Testing for  HPV may also be done on women of any age who have unclear Pap test results.  Other health care providers may not recommend any screening for nonpregnant women who are considered low risk for pelvic cancer and have no symptoms. Ask your health care provider if a screening pelvic exam is right for you.  If you have had past treatment for cervical cancer or a condition that could lead to cancer, you need Pap tests and screening for cancer for at least 20 years after your treatment. If Pap tests have been discontinued for you, your risk factors (such as having a new sexual partner) need to be reassessed to determine if you should start having screenings again. Some women have medical problems that increase the chance of getting cervical cancer. In these cases, your health care provider may recommend that you have screening and Pap tests more often.  If you have a family history of uterine cancer or ovarian cancer, talk with your health care provider about genetic screening.  If you have vaginal bleeding after reaching menopause, tell your health care provider.  There are currently no reliable tests available to screen for ovarian cancer.  Lung Cancer Lung cancer screening is recommended for adults 96-48 years old who are at high risk for lung cancer because of a history of smoking.  A yearly low-dose CT scan of the lungs is recommended if you:  Currently smoke.  Have a history of at least 30 pack-years of smoking and you currently smoke or have quit within the past 15 years. A pack-year is smoking an average of one pack of cigarettes per day for one year.  Yearly screening should:  Continue until it has been 15 years since you quit.  Stop if you develop a health problem that would prevent you from having lung cancer treatment.  Colorectal Cancer  This type of cancer can be detected and can often be prevented.  Routine colorectal cancer screening usually begins at age 69 and continues through  age 72.  If you have risk factors for colon cancer, your health care provider may recommend that you be screened at an earlier age.  If you have a family history of colorectal cancer, talk with your health care provider about genetic screening.  Your health care provider may also recommend using home test kits to check for hidden blood in your stool.  A small camera at the end of a tube can be used to examine your colon directly (sigmoidoscopy or colonoscopy). This is done to check for the earliest forms of colorectal cancer.  Direct examination of the colon should be repeated every 5-10 years until age 55. However, if early forms of precancerous polyps or small growths are found or if you have a family history or genetic risk for colorectal cancer, you may need to be screened more often.  Skin Cancer  Check your skin from head to toe regularly.  Monitor any moles. Be sure to tell your health care provider: ? About any new moles or changes in moles, especially if there is a change in a mole's shape or color. ? If you have a mole that is larger than the size of a pencil eraser.  If any of your family members has a history of skin cancer, especially at a young age, talk with your health care provider about genetic screening.  Always use sunscreen. Apply sunscreen liberally and repeatedly throughout the day.  Whenever you are outside, protect yourself by wearing long sleeves, pants, a wide-brimmed hat, and sunglasses.  What should I know about osteoporosis? Osteoporosis is a condition in which bone destruction happens more quickly than new bone creation. After menopause, you may be at an increased risk for osteoporosis. To help prevent osteoporosis or the bone fractures that can happen because of osteoporosis, the following is recommended:  If you are 58-46 years old, get at least 1,000 mg of calcium and at least 600 mg of vitamin D per day.  If you are older than age 43 but younger than  age 37, get at least 1,200 mg of calcium and at least 600 mg of vitamin D per day.  If you are older than age 35, get at least 1,200 mg of calcium and at least 800 mg of vitamin D per day.  Smoking and excessive alcohol intake increase the risk of osteoporosis. Eat foods that are rich in calcium and vitamin D, and do weight-bearing exercises several times each week as directed by your health care provider. What should I know about how menopause affects my mental health? Depression may occur at any age, but it is more common as you become older. Common symptoms of depression include:  Low or sad mood.  Changes in sleep patterns.  Changes in appetite or eating patterns.  Feeling an overall lack of motivation  or enjoyment of activities that you previously enjoyed.  Frequent crying spells.  Talk with your health care provider if you think that you are experiencing depression. What should I know about immunizations? It is important that you get and maintain your immunizations. These include:  Tetanus, diphtheria, and pertussis (Tdap) booster vaccine.  Influenza every year before the flu season begins.  Pneumonia vaccine.  Shingles vaccine.  Your health care provider may also recommend other immunizations. This information is not intended to replace advice given to you by your health care provider. Make sure you discuss any questions you have with your health care provider. Document Released: 08/16/2005 Document Revised: 01/12/2016 Document Reviewed: 03/28/2015 Elsevier Interactive Patient Education  2018 [employer].

## 2017-03-14 NOTE — Progress Notes (Addendum)
Shannon Berger 1941-03-29 [id number]   History:    76 y.o. G2P2 Married  RP:  Established patient presenting for annual gyn exam  HPI:  Menopause.  No HRT.  No PMB.  No pelvic pain. Not sexually active.  Has had many back surgeries with fusions, continued back pain.  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  Breasts wnl.  Mictions/BMs wnl.  Past medical history,surgical history, family history and social history were all reviewed and documented in the EPIC chart.  Gynecologic History No LMP recorded. Patient is postmenopausal. Contraception: post menopausal status Last Pap: 2016. Results were: normal Last mammogram: 2018. Results were: normal  Obstetric History OB History  Gravida Para Term Preterm AB Living  2 2       2   SAB TAB Ectopic Multiple Live Births               # Outcome Date GA Lbr Len/2nd Weight Sex Delivery Anes PTL Lv  2 Para           1 Para                ROS: A ROS was performed and pertinent positives and negatives are included in the history.  GENERAL: No fevers or chills. HEENT: No change in vision, no earache, sore throat or sinus congestion. NECK: No pain or stiffness. CARDIOVASCULAR: No chest pain or pressure. No palpitations. PULMONARY: No shortness of breath, cough or wheeze. GASTROINTESTINAL: No abdominal pain, nausea, vomiting or diarrhea, melena or bright red blood per rectum. GENITOURINARY: No urinary frequency, urgency, hesitancy or dysuria. MUSCULOSKELETAL: No joint or muscle pain, no back pain, no recent trauma. DERMATOLOGIC: No rash, no itching, no lesions. ENDOCRINE: No polyuria, polydipsia, no heat or cold intolerance. No recent change in weight. HEMATOLOGICAL: No anemia or easy bruising or bleeding. NEUROLOGIC: No headache, seizures, numbness, tingling or weakness. PSYCHIATRIC: No depression, no loss of interest in normal activity or change in sleep pattern.     Exam:   BP 128/78   Ht 5\' 2"  (1.575 m)   Wt  113 lb 12.8 oz (51.6 kg)   BMI 20.81 kg/m   Body mass index is 20.81 kg/m.  General appearance : Well developed well nourished female. No acute distress HEENT: Eyes: no retinal hemorrhage or exudates,  Neck supple, trachea midline, no carotid bruits, no thyroidmegaly Lungs: Clear to auscultation, no rhonchi or wheezes, or rib retractions  Heart: Regular rate and rhythm, no murmurs or gallops Breast:Examined in sitting and supine position were symmetrical in appearance, no palpable masses or tenderness,  no skin retraction, no nipple inversion, no nipple discharge, no skin discoloration, no axillary or supraclavicular lymphadenopathy Abdomen: no palpable masses or tenderness, no rebound or guarding Extremities: no edema or skin discoloration or tenderness  Pelvic:  Vulva normal.  2 small hemangiomas, patient reassured.  Bartholin, Urethra, Skene Glands: Within normal limits             Vagina: No gross lesions or discharge  Cervix: No gross lesions or discharge.  Pap reflex done.  Uterus  AV, normal size, shape and consistency, non-tender and mobile  Adnexa  Without masses or tenderness  Anus and perineum  normal    Assessment/Plan:  76 y.o. female for annual exam   1. Encounter for routine gynecological examination with Papanicolaou smear of cervix Normal gyn exam in Menopause.  Pap reflex done.  Breasts wnl.  Screening mammo normal 2018.  2. Menopause present  No HRT.  No PMB.  Risk of Osteoporosis.  Vit D supplements/Ca++ in nutrition.  Weight bearing physical activity.  Schedule Bone Density.  3. Vulvar lesion 2 small hemangiomas on right vulva.  Reassured.  4. Screening for malignant neoplasm of cervix  - Pap IG w/ reflex to HPV when [facility]  Counseling on above issues >50% x 15 minutes  Princess Bruins MD, 10:55 AM 03/14/2017

## 2017-03-17 LAB — PAP IG W/ RFLX HPV ASCU

## 2017-03-19 ENCOUNTER — Encounter: Payer: Self-pay | Admitting: *Deleted

## 2017-03-19 DIAGNOSIS — M4714 Other spondylosis with myelopathy, thoracic region: Secondary | ICD-10-CM | POA: Diagnosis not present

## 2017-03-19 DIAGNOSIS — M48062 Spinal stenosis, lumbar region with neurogenic claudication: Secondary | ICD-10-CM | POA: Diagnosis not present

## 2017-03-19 DIAGNOSIS — M4327 Fusion of spine, lumbosacral region: Secondary | ICD-10-CM | POA: Diagnosis not present

## 2017-03-19 DIAGNOSIS — M4326 Fusion of spine, lumbar region: Secondary | ICD-10-CM | POA: Diagnosis not present

## 2017-04-01 ENCOUNTER — Ambulatory Visit: Payer: Medicare Other

## 2017-04-01 DIAGNOSIS — B351 Tinea unguium: Secondary | ICD-10-CM

## 2017-04-19 DIAGNOSIS — Z23 Encounter for immunization: Secondary | ICD-10-CM | POA: Diagnosis not present

## 2017-04-30 DIAGNOSIS — R922 Inconclusive mammogram: Secondary | ICD-10-CM | POA: Diagnosis not present

## 2017-05-09 ENCOUNTER — Ambulatory Visit: Payer: Medicare Other

## 2017-05-13 ENCOUNTER — Ambulatory Visit (INDEPENDENT_AMBULATORY_CARE_PROVIDER_SITE_OTHER): Payer: Medicare Other | Admitting: Podiatry

## 2017-05-13 DIAGNOSIS — B351 Tinea unguium: Secondary | ICD-10-CM

## 2017-05-13 NOTE — Progress Notes (Signed)
Pt presents with mycotic infection of nails Rt 1 and Lt 1, 3,4,5 All other systems are negative  Laser therapy administered to affected nails and tolerated well. All safety precautions were in place. Re-appointed in 4 weeks for 4th treatment

## 2017-06-09 ENCOUNTER — Ambulatory Visit (INDEPENDENT_AMBULATORY_CARE_PROVIDER_SITE_OTHER): Payer: Medicare Other | Admitting: Podiatry

## 2017-06-09 DIAGNOSIS — B351 Tinea unguium: Secondary | ICD-10-CM

## 2017-06-09 DIAGNOSIS — I10 Essential (primary) hypertension: Secondary | ICD-10-CM | POA: Diagnosis not present

## 2017-06-10 ENCOUNTER — Ambulatory Visit: Payer: Medicare Other

## 2017-06-19 DIAGNOSIS — M545 Low back pain: Secondary | ICD-10-CM | POA: Diagnosis not present

## 2017-06-19 DIAGNOSIS — G250 Essential tremor: Secondary | ICD-10-CM | POA: Diagnosis not present

## 2017-06-19 DIAGNOSIS — I10 Essential (primary) hypertension: Secondary | ICD-10-CM | POA: Diagnosis not present

## 2017-06-19 DIAGNOSIS — F3289 Other specified depressive episodes: Secondary | ICD-10-CM | POA: Diagnosis not present

## 2017-06-23 DIAGNOSIS — H53411 Scotoma involving central area, right eye: Secondary | ICD-10-CM | POA: Diagnosis not present

## 2017-06-23 DIAGNOSIS — H47011 Ischemic optic neuropathy, right eye: Secondary | ICD-10-CM | POA: Diagnosis not present

## 2017-06-23 DIAGNOSIS — Z961 Presence of intraocular lens: Secondary | ICD-10-CM | POA: Diagnosis not present

## 2017-06-23 DIAGNOSIS — H35371 Puckering of macula, right eye: Secondary | ICD-10-CM | POA: Diagnosis not present

## 2017-06-23 NOTE — Progress Notes (Signed)
Pt presents with mycotic infection of nails Rt 1 and Lt 1, 3,4,5 All other systems are negative  Laser therapy administered to affected nails and tolerated well. All safety precautions were in place. Re-appointed in 4 weeks for 5th treatment

## 2017-07-07 DIAGNOSIS — H53411 Scotoma involving central area, right eye: Secondary | ICD-10-CM | POA: Diagnosis not present

## 2017-07-07 DIAGNOSIS — H47091 Other disorders of optic nerve, not elsewhere classified, right eye: Secondary | ICD-10-CM | POA: Diagnosis not present

## 2017-07-09 ENCOUNTER — Encounter: Payer: Self-pay | Admitting: Vascular Surgery

## 2017-07-09 ENCOUNTER — Ambulatory Visit (HOSPITAL_COMMUNITY)
Admission: RE | Admit: 2017-07-09 | Discharge: 2017-07-09 | Disposition: A | Discharge status: Home / Self Care | Payer: Medicare Other | Source: Ambulatory Visit | Attending: Vascular Surgery | Admitting: Vascular Surgery

## 2017-07-09 ENCOUNTER — Ambulatory Visit: Payer: Medicare Other | Admitting: Vascular Surgery

## 2017-07-09 VITALS — BP 143/75 | HR 57 | Temp 98.2°F | Resp 16 | Ht 62.0 in | Wt 115.0 lb

## 2017-07-09 DIAGNOSIS — I6523 Occlusion and stenosis of bilateral carotid arteries: Secondary | ICD-10-CM | POA: Diagnosis not present

## 2017-07-09 LAB — VAS US CAROTID
LEFT ECA DIAS: -17 cm/s
LEFT VERTEBRAL DIAS: -15 cm/s
Left CCA dist dias: -21 cm/s
Left CCA dist sys: -82 cm/s
Left CCA prox dias: 23 cm/s
Left CCA prox sys: 107 cm/s
Left ICA dist dias: -21 cm/s
Left ICA dist sys: -88 cm/s
Left ICA prox dias: 66 cm/s
Left ICA prox sys: 203 cm/s
RIGHT CCA MID DIAS: 20 cm/s
RIGHT ECA DIAS: -17 cm/s
RIGHT VERTEBRAL DIAS: -19 cm/s
Right CCA prox dias: 26 cm/s
Right CCA prox sys: 116 cm/s
Right cca dist sys: -77 cm/s

## 2017-07-09 NOTE — Progress Notes (Signed)
Patient name: Shannon Berger MRN: [id number] DOB: [birth year] Sex: female  REASON FOR VISIT:   Follow-up of bilateral carotid disease.  HPI:   Shannon Berger is a pleasant 77 y.o. female who we have been following with bilateral carotid disease.  Patient has a 40-59% right carotid stenosis and a 60-79% left carotid stenosis.  The patient had a visual disturbance after some surgery on her eye.  I saw the patient back on 02/05/2017 and did not think that this was related to her carotid disease.  I set her up for a 85-month follow-up visit.  Since I saw her last, she denies any history of stroke, TIAs, expressive or receptive aphasia, or amaurosis fugax.  She still having some issues with the right eye but this has been stable.  She is not a smoker.  She exercises at the [facility] 3 times a week.  Past Medical History:  Diagnosis Date  . Arthritis   . Carotid arterial disease ([place])   . Constipation   . Degeneration of lumbar intervertebral disc   . 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 Fusion of spine of lumbar region   . Hyperlipidemia   . Hypertension   . Low back pain    Radiating to left leg  . Osteoarthritis   . Osteopenia   . Sleep difficulties   . Spinal stenosis at L4-L5 level     Family History  Problem Relation Age of Onset  . Heart failure Mother   . Cancer Mother        colon  . Hypertension Mother   . Other Mother        Varicose Veins  . Diabetes Father   . Heart disease Father   . Hypertension Father   . Heart attack Father   . Cancer Brother        squamos cell - head, neck, ear    SOCIAL HISTORY: Social History   Tobacco Use  . Smoking status: Never Smoker  . Smokeless tobacco: Never Used  Substance Use Topics  . Alcohol use: Yes    Alcohol/week: 0.0 oz    Comment: 1 gl wine biweekly    Allergies  Allergen Reactions  . Oxycodone-Acetaminophen Anaphylaxis  . Celebrex [Celecoxib]     Pt does not remember if this is truly an  allergy. Hallucinations  . Hydrocodone   . Hydrocodone-Acetaminophen Nausea And Vomiting  . Oxycodone     Current Outpatient Medications  Medication Sig Dispense Refill  . acetaminophen (TYLENOL) 500 MG tablet Take 325 mg by mouth.     . ALPRAZolam (XANAX) 0.25 MG tablet Take 0.125 mg by mouth at bedtime as needed. Take 1/2 tablet po PRN QHS insomnia or anxiety    . Ascorbic Acid (VITAMIN C PO) Take 1,000 mg by mouth at bedtime.     Marland Kitchen aspirin EC 81 MG tablet Take 81 mg by mouth at bedtime.     Marland Kitchen atorvastatin (LIPITOR) 40 MG tablet Take 40 mg by mouth daily.  3  . bisoprolol-hydrochlorothiazide (ZIAC) 2.5-6.25 MG per tablet Take 1 tablet by mouth every morning.     Marland Kitchen buPROPion (WELLBUTRIN XL) 150 MG 24 hr tablet Take 150 mg by mouth daily.  3  . Calcium Carb-Cholecalciferol 500-600 MG-UNIT TABS Take by mouth.    . calcium carbonate (OS-CAL) 600 MG tablet Take 600 mg by mouth daily.    . Cholecalciferol (D3-1000) 1000 units tablet Take 1,000 Units by mouth daily.    Marland Kitchen  DULoxetine (CYMBALTA) 60 MG capsule 60 mg.    . ezetimibe (ZETIA) 10 MG tablet Take 10 mg by mouth daily.    Marland Kitchen ibandronate (BONIVA) 150 MG tablet TK 1 T PO ONCE A MONTH AS DIRECTED    . losartan (COZAAR) 50 MG tablet Take 50 mg by mouth daily.    . meloxicam (MOBIC) 15 MG tablet Take 15 mg by mouth as needed.     . Multiple Vitamin (MULTIVITAMIN) tablet Take 1 tablet by mouth daily.      . Omega-3 Fatty Acids (FISH OIL PO) Take 1,400 mg by mouth 2 (two) times daily.     Marland Kitchen omeprazole (PRILOSEC) 20 MG capsule Take 20 mg by mouth daily.    Marland Kitchen senna (SENOKOT) 8.6 MG TABS tablet Take 1 tablet by mouth at bedtime as needed for mild constipation.    Marland Kitchen venlafaxine (EFFEXOR) 25 MG tablet Take 25 mg by mouth 2 (two) times daily.     No current facility-administered medications for this visit.     REVIEW OF SYSTEMS:  [X]  denotes positive finding, [ ]  denotes negative finding Cardiac  Comments:  Chest pain or chest pressure:      Shortness of breath upon exertion: X   Short of breath when lying flat:    Irregular heart rhythm:        Vascular    Pain in calf, thigh, or hip brought on by ambulation:    Pain in feet at night that wakes you up from your sleep:     Blood clot in your veins:    Leg swelling:         Pulmonary    Oxygen at home:    Productive cough:     Wheezing:         Neurologic    Sudden weakness in arms or legs:     Sudden numbness in arms or legs:     Sudden onset of difficulty speaking or slurred speech:    Temporary loss of vision in one eye:     Problems with dizziness:         Gastrointestinal    Blood in stool:     Vomited blood:         Genitourinary    Burning when urinating:     Blood in urine:        Psychiatric    Major depression:         Hematologic    Bleeding problems:    Problems with blood clotting too easily:        Skin    Rashes or ulcers:        Constitutional    Fever or chills:     PHYSICAL EXAM:   Vitals:   07/09/17 1046 07/09/17 1048  BP: (!) 151/80 (!) 143/75  Pulse: (!) 57   Resp: 16   Temp: 98.2 F (36.8 C)   TempSrc: Oral   SpO2: 100%   Weight: 115 lb (52.2 kg)   Height: 5\' 2"  (1.575 m)     GENERAL: The patient is a well-nourished female, in no acute distress. The vital signs are documented above. CARDIAC: There is a regular rate and rhythm.  VASCULAR: I do not detect carotid bruits. She has palpable posterior tibial pulses bilaterally. PULMONARY: There is good air exchange bilaterally without wheezing or rales. ABDOMEN: Soft and non-tender with normal pitched bowel sounds.  MUSCULOSKELETAL: There are no major deformities or cyanosis. NEUROLOGIC: No focal weakness or  paresthesias are detected. SKIN: There are no ulcers or rashes noted. PSYCHIATRIC: The patient has a normal affect.  DATA:    CAROTID DUPLEX: I have independently interpreted her carotid duplex scan today.  On the right side she has a 40-59% stenosis.  This has  not changed compared to the study 6 months ago.  On the left side she has a 60-79% carotid stenosis.  This likely has not changed compared the study 6 months ago.  The stenosis is in the lower end of that range.  MEDICAL ISSUES:   BILATERAL CAROTID STENOSES: This patient has bilateral carotid stenoses which are asymptomatic.  She understands we would not consider carotid endarterectomy unless the stenosis progressed to greater than 80% or she develops new symptoms.  We have discussed the symptoms of cerebrovascular disease.  She is on aspirin and is on a statin.  She is not a smoker.  I have ordered a follow-up carotid duplex scan in 6 months and I will see her back at that time.  She knows to call sooner if she has problems.  [name] [facility] [phone]

## 2017-07-17 DIAGNOSIS — H5213 Myopia, bilateral: Secondary | ICD-10-CM | POA: Diagnosis not present

## 2017-07-31 DIAGNOSIS — H4720 Unspecified optic atrophy: Secondary | ICD-10-CM | POA: Diagnosis not present

## 2017-07-31 DIAGNOSIS — H40051 Ocular hypertension, right eye: Secondary | ICD-10-CM | POA: Diagnosis not present

## 2017-09-17 DIAGNOSIS — M48062 Spinal stenosis, lumbar region with neurogenic claudication: Secondary | ICD-10-CM | POA: Diagnosis not present

## 2017-09-18 DIAGNOSIS — H47011 Ischemic optic neuropathy, right eye: Secondary | ICD-10-CM | POA: Diagnosis not present

## 2017-09-18 DIAGNOSIS — Z961 Presence of intraocular lens: Secondary | ICD-10-CM | POA: Diagnosis not present

## 2017-09-18 DIAGNOSIS — H35371 Puckering of macula, right eye: Secondary | ICD-10-CM | POA: Diagnosis not present

## 2017-09-19 DIAGNOSIS — M25561 Pain in right knee: Secondary | ICD-10-CM | POA: Diagnosis not present

## 2017-09-19 DIAGNOSIS — S4992XA Unspecified injury of left shoulder and upper arm, initial encounter: Secondary | ICD-10-CM | POA: Diagnosis not present

## 2017-09-19 DIAGNOSIS — S8991XA Unspecified injury of right lower leg, initial encounter: Secondary | ICD-10-CM | POA: Diagnosis not present

## 2017-09-19 DIAGNOSIS — T148XXA Other injury of unspecified body region, initial encounter: Secondary | ICD-10-CM | POA: Diagnosis not present

## 2017-09-22 ENCOUNTER — Other Ambulatory Visit: Payer: Self-pay | Admitting: Orthopedic Surgery

## 2017-09-22 DIAGNOSIS — M25522 Pain in left elbow: Secondary | ICD-10-CM

## 2017-09-22 DIAGNOSIS — S42412A Displaced simple supracondylar fracture without intercondylar fracture of left humerus, initial encounter for closed fracture: Secondary | ICD-10-CM

## 2017-09-22 DIAGNOSIS — M25512 Pain in left shoulder: Secondary | ICD-10-CM | POA: Diagnosis not present

## 2017-09-25 ENCOUNTER — Ambulatory Visit
Admission: RE | Admit: 2017-09-25 | Discharge: 2017-09-25 | Disposition: A | Discharge status: Home / Self Care | Payer: Medicare Other | Source: Ambulatory Visit | Attending: Orthopedic Surgery | Admitting: Orthopedic Surgery

## 2017-09-25 DIAGNOSIS — M7989 Other specified soft tissue disorders: Secondary | ICD-10-CM | POA: Diagnosis not present

## 2017-09-25 DIAGNOSIS — M25522 Pain in left elbow: Secondary | ICD-10-CM

## 2017-09-25 DIAGNOSIS — S42412A Displaced simple supracondylar fracture without intercondylar fracture of left humerus, initial encounter for closed fracture: Secondary | ICD-10-CM

## 2017-09-25 DIAGNOSIS — S4992XA Unspecified injury of left shoulder and upper arm, initial encounter: Secondary | ICD-10-CM | POA: Diagnosis not present

## 2017-09-29 DIAGNOSIS — M25512 Pain in left shoulder: Secondary | ICD-10-CM | POA: Diagnosis not present

## 2017-09-29 DIAGNOSIS — S42465A Nondisplaced fracture of medial condyle of left humerus, initial encounter for closed fracture: Secondary | ICD-10-CM | POA: Diagnosis not present

## 2017-09-30 ENCOUNTER — Other Ambulatory Visit: Payer: Self-pay

## 2017-10-06 DIAGNOSIS — S42465D Nondisplaced fracture of medial condyle of left humerus, subsequent encounter for fracture with routine healing: Secondary | ICD-10-CM | POA: Diagnosis not present

## 2017-10-28 ENCOUNTER — Encounter: Payer: Self-pay | Admitting: Obstetrics & Gynecology

## 2017-10-28 DIAGNOSIS — Z1231 Encounter for screening mammogram for malignant neoplasm of breast: Secondary | ICD-10-CM | POA: Diagnosis not present

## 2017-10-28 DIAGNOSIS — Z803 Family history of malignant neoplasm of breast: Secondary | ICD-10-CM | POA: Diagnosis not present

## 2017-11-03 DIAGNOSIS — S42465D Nondisplaced fracture of medial condyle of left humerus, subsequent encounter for fracture with routine healing: Secondary | ICD-10-CM | POA: Diagnosis not present

## 2017-11-21 DIAGNOSIS — L821 Other seborrheic keratosis: Secondary | ICD-10-CM | POA: Diagnosis not present

## 2017-11-21 DIAGNOSIS — L570 Actinic keratosis: Secondary | ICD-10-CM | POA: Diagnosis not present

## 2017-11-21 DIAGNOSIS — D692 Other nonthrombocytopenic purpura: Secondary | ICD-10-CM | POA: Diagnosis not present

## 2017-11-21 DIAGNOSIS — L84 Corns and callosities: Secondary | ICD-10-CM | POA: Diagnosis not present

## 2017-12-03 DIAGNOSIS — S42465D Nondisplaced fracture of medial condyle of left humerus, subsequent encounter for fracture with routine healing: Secondary | ICD-10-CM | POA: Diagnosis not present

## 2017-12-04 DIAGNOSIS — I10 Essential (primary) hypertension: Secondary | ICD-10-CM | POA: Diagnosis not present

## 2017-12-15 DIAGNOSIS — M545 Low back pain: Secondary | ICD-10-CM | POA: Diagnosis not present

## 2017-12-15 DIAGNOSIS — I6529 Occlusion and stenosis of unspecified carotid artery: Secondary | ICD-10-CM | POA: Diagnosis not present

## 2017-12-15 DIAGNOSIS — I10 Essential (primary) hypertension: Secondary | ICD-10-CM | POA: Diagnosis not present

## 2017-12-15 DIAGNOSIS — M810 Age-related osteoporosis without current pathological fracture: Secondary | ICD-10-CM | POA: Diagnosis not present

## 2017-12-22 ENCOUNTER — Encounter: Payer: Self-pay | Admitting: Podiatry

## 2017-12-22 ENCOUNTER — Ambulatory Visit: Payer: Medicare Other | Admitting: Podiatry

## 2017-12-22 DIAGNOSIS — M21611 Bunion of right foot: Secondary | ICD-10-CM | POA: Diagnosis not present

## 2017-12-22 DIAGNOSIS — L84 Corns and callosities: Secondary | ICD-10-CM

## 2017-12-29 NOTE — Progress Notes (Signed)
   Subjective: 77 year old female presenting today with for follow up evaluation of painful callus lesions of the right foot as well as a bunion and hammertoes of the right foot. She reports having completed laser treatment for the onychomycosis. She denies any new complaints at this time. Patient is here for further evaluation and treatment.   Past Medical History:  Diagnosis Date  . Arthritis   . Carotid arterial disease ([place])   . Constipation   . Degeneration of lumbar intervertebral disc   . 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 Fusion of spine of lumbar region   . Hyperlipidemia   . Hypertension   . Low back pain    Radiating to left leg  . Osteoarthritis   . Osteopenia   . Sleep difficulties   . Spinal stenosis at L4-L5 level      Objective:  Physical Exam General: Alert and oriented x3 in no acute distress  Dermatology: Hyperkeratotic lesion present on the plantar aspect of the right foot 2. Pain on palpation with a central nucleated core noted.  Skin is warm, dry and supple bilateral lower extremities. Negative for open lesions or macerations.  Vascular: Palpable pedal pulses bilaterally. No edema or erythema noted. Capillary refill within normal limits.  Neurological: Epicritic and protective threshold grossly intact bilaterally.   Musculoskeletal Exam: Pain on palpation at the keratotic lesion noted. Range of motion within normal limits bilateral. Muscle strength 5/5 in all groups bilateral.  Assessment: #1 symptomatic callus lesions right plantar forefoot x 2 #2 hallux abductovalgus bunion deformity right with hammertoe contracture digits right foot   Plan of Care:  #1 Patient evaluated #2 callus lesions debrided using a chisel blade without incident or bleeding.  #3 recommend good shoe gear that is wide fitting to alleviate pressure from the bunions #4 Return to clinic as needed.   Edrick Kins, DPM [facility]  Dr. Edrick Kins, [address]                                        [place], [place] [address]                Office [phone]  Fax [phone]

## 2017-12-31 DIAGNOSIS — S42465D Nondisplaced fracture of medial condyle of left humerus, subsequent encounter for fracture with routine healing: Secondary | ICD-10-CM | POA: Diagnosis not present

## 2018-01-01 DIAGNOSIS — H43812 Vitreous degeneration, left eye: Secondary | ICD-10-CM | POA: Diagnosis not present

## 2018-01-01 DIAGNOSIS — H4720 Unspecified optic atrophy: Secondary | ICD-10-CM | POA: Diagnosis not present

## 2018-01-01 DIAGNOSIS — H5340 Unspecified visual field defects: Secondary | ICD-10-CM | POA: Diagnosis not present

## 2018-01-01 DIAGNOSIS — H40051 Ocular hypertension, right eye: Secondary | ICD-10-CM | POA: Diagnosis not present

## 2018-01-06 ENCOUNTER — Other Ambulatory Visit: Payer: Self-pay

## 2018-01-06 DIAGNOSIS — I6523 Occlusion and stenosis of bilateral carotid arteries: Secondary | ICD-10-CM

## 2018-01-07 ENCOUNTER — Encounter: Payer: Self-pay | Admitting: Vascular Surgery

## 2018-01-07 ENCOUNTER — Other Ambulatory Visit: Payer: Self-pay

## 2018-01-07 ENCOUNTER — Ambulatory Visit (HOSPITAL_COMMUNITY)
Admission: RE | Admit: 2018-01-07 | Discharge: 2018-01-07 | Disposition: A | Discharge status: Home / Self Care | Payer: Medicare Other | Source: Ambulatory Visit | Attending: Vascular Surgery | Admitting: Vascular Surgery

## 2018-01-07 ENCOUNTER — Ambulatory Visit: Payer: Medicare Other | Admitting: Vascular Surgery

## 2018-01-07 VITALS — BP 156/83 | HR 57 | Temp 97.0°F | Resp 16 | Ht 63.0 in | Wt 115.0 lb

## 2018-01-07 DIAGNOSIS — I6523 Occlusion and stenosis of bilateral carotid arteries: Secondary | ICD-10-CM

## 2018-01-07 NOTE — Progress Notes (Signed)
Patient name: Shannon Berger MRN: [id number] DOB: [birth year] Sex: female  REASON FOR VISIT:   Follow-up of bilateral carotid stenosis.  HPI:   Shannon Berger is a pleasant 77 y.o. female who I last saw on 07/09/2017.  I have been following the patient with bilateral carotid disease.  Patient had a 40 to 59% right carotid stenosis and a 60 to 79% left carotid stenosis.  Since I saw her last, she denies any history of stroke, TIAs, expressive or receptive aphasia, or amaurosis fugax.  She denies any claudication or rest pain.  Past Medical History:  Diagnosis Date  . Arthritis   . Carotid arterial disease ([place])   . Constipation   . Degeneration of lumbar intervertebral disc   . Dislocation of hip joint prosthesis ([place])   . Fusion of spine of lumbar region   . Hyperlipidemia   . Hypertension   . Low back pain    Radiating to left leg  . Osteoarthritis   . Osteopenia   . Sleep difficulties   . Spinal stenosis at L4-L5 level     Family History  Problem Relation Age of Onset  . Heart failure Mother   . Cancer Mother        colon  . Hypertension Mother   . Other Mother        Varicose Veins  . Diabetes Father   . Heart disease Father   . Hypertension Father   . Heart attack Father   . Cancer Brother        squamos cell - head, neck, ear    SOCIAL HISTORY: Social History   Tobacco Use  . Smoking status: Never Smoker  . Smokeless tobacco: Never Used  Substance Use Topics  . Alcohol use: Yes    Alcohol/week: 0.0 oz    Comment: 1 gl wine biweekly    Allergies  Allergen Reactions  . Oxycodone-Acetaminophen Anaphylaxis  . Celebrex [Celecoxib]     Pt does not remember if this is truly an allergy. Hallucinations  . Hydrocodone   . Hydrocodone-Acetaminophen Nausea And Vomiting  . Oxycodone     Current Outpatient Medications  Medication Sig Dispense Refill  . acetaminophen (TYLENOL) 500 MG tablet Take 325 mg by mouth.     . ALPRAZolam (XANAX) 0.25  MG tablet Take 0.125 mg by mouth at bedtime as needed. Take 1/2 tablet po PRN QHS insomnia or anxiety    . Ascorbic Acid (VITAMIN C PO) Take 1,000 mg by mouth at bedtime.     Marland Kitchen aspirin EC 81 MG tablet Take 81 mg by mouth at bedtime.     Marland Kitchen atorvastatin (LIPITOR) 40 MG tablet Take 40 mg by mouth daily.  3  . bisoprolol-hydrochlorothiazide (ZIAC) 2.5-6.25 MG per tablet Take 1 tablet by mouth every morning.     Marland Kitchen buPROPion (WELLBUTRIN XL) 150 MG 24 hr tablet Take 150 mg by mouth daily.  3  . Calcium Carb-Cholecalciferol 500-600 MG-UNIT TABS Take by mouth.    . calcium carbonate (OS-CAL) 600 MG tablet Take 600 mg by mouth daily.    . Cholecalciferol (D3-1000) 1000 units tablet Take 1,000 Units by mouth daily.    . DULoxetine (CYMBALTA) 60 MG capsule 60 mg.    . ezetimibe (ZETIA) 10 MG tablet Take 10 mg by mouth daily.    . hydrochlorothiazide (HYDRODIURIL) 25 MG tablet TK 1 T QD  3  . ibandronate (BONIVA) 150 MG tablet TK 1 T PO ONCE A MONTH AS  DIRECTED    . losartan (COZAAR) 50 MG tablet Take 50 mg by mouth daily.    . meloxicam (MOBIC) 15 MG tablet Take 15 mg by mouth as needed.     . Multiple Vitamin (MULTIVITAMIN) tablet Take 1 tablet by mouth daily.      . Omega-3 Fatty Acids (FISH OIL PO) Take 1,400 mg by mouth 2 (two) times daily.     Marland Kitchen omeprazole (PRILOSEC) 20 MG capsule Take 20 mg by mouth daily.    Marland Kitchen senna (SENOKOT) 8.6 MG TABS tablet Take 1 tablet by mouth at bedtime as needed for mild constipation.    Marland Kitchen venlafaxine (EFFEXOR) 25 MG tablet Take 75 mg by mouth daily.      No current facility-administered medications for this visit.     REVIEW OF SYSTEMS:  [X]  denotes positive finding, [ ]  denotes negative finding Cardiac  Comments:  Chest pain or chest pressure: x   Shortness of breath upon exertion: x   Short of breath when lying flat:    Irregular heart rhythm:        Vascular    Pain in calf, thigh, or hip brought on by ambulation:    Pain in feet at night that wakes you up  from your sleep:     Blood clot in your veins:    Leg swelling:         Pulmonary    Oxygen at home:    Productive cough:     Wheezing:         Neurologic    Sudden weakness in arms or legs:     Sudden numbness in arms or legs:     Sudden onset of difficulty speaking or slurred speech:    Temporary loss of vision in one eye:     Problems with dizziness:         Gastrointestinal    Blood in stool:     Vomited blood:         Genitourinary    Burning when urinating:     Blood in urine:        Psychiatric    Major depression:         Hematologic    Bleeding problems:    Problems with blood clotting too easily:        Skin    Rashes or ulcers:        Constitutional    Fever or chills:     PHYSICAL EXAM:   Vitals:   01/07/18 1108 01/07/18 1116  BP: (!) 178/80 (!) 164/81  Pulse: (!) 56 (!) 57  Resp: 16   Temp: (!) 97 F (36.1 C)   TempSrc: Oral   SpO2: 100%   Weight: 115 lb (52.2 kg)   Height: 5\' 3"  (1.6 m)     GENERAL: The patient is a well-nourished female, in no acute distress. The vital signs are documented above. CARDIAC: There is a regular rate and rhythm.  VASCULAR: I do not detect carotid bruits. She has palpable posterior tibial pulses. She has no significant lower extremity swelling. PULMONARY: There is good air exchange bilaterally without wheezing or rales. ABDOMEN: Soft and non-tender with normal pitched bowel sounds.  MUSCULOSKELETAL: There are no major deformities or cyanosis. NEUROLOGIC: No focal weakness or paresthesias are detected. SKIN: There are no ulcers or rashes noted. PSYCHIATRIC: The patient has a normal affect.  DATA:    CAROTID DUPLEX: I have independently interpreted her carotid duplex scan today.  On the right side there is a less than 39% stenosis.  These velocities have improved compared to the study 6 months ago.  On the left side there is a 60 to 79% stenosis.  This is stable.  Both vertebral arteries are patent with  antegrade flow  MEDICAL ISSUES:   BILATERAL CAROTID DISEASE: The stenosis on the right has improved.  The patient has a stable 60 to 79% left carotid stenosis.  She is asymptomatic.  She is on aspirin and is on a statin.  She is not a smoker.  I recommended a follow-up carotid duplex scan in 6 months and I will see her back at that time.  She knows to call sooner if she has problems.  [name] [facility] [phone]

## 2018-01-12 ENCOUNTER — Other Ambulatory Visit: Payer: Self-pay

## 2018-01-12 DIAGNOSIS — I6523 Occlusion and stenosis of bilateral carotid arteries: Secondary | ICD-10-CM

## 2018-02-11 DIAGNOSIS — S42465D Nondisplaced fracture of medial condyle of left humerus, subsequent encounter for fracture with routine healing: Secondary | ICD-10-CM | POA: Diagnosis not present

## 2018-02-24 DIAGNOSIS — H6123 Impacted cerumen, bilateral: Secondary | ICD-10-CM | POA: Diagnosis not present

## 2018-02-24 DIAGNOSIS — H9113 Presbycusis, bilateral: Secondary | ICD-10-CM | POA: Diagnosis not present

## 2018-02-24 DIAGNOSIS — Z7289 Other problems related to lifestyle: Secondary | ICD-10-CM | POA: Diagnosis not present

## 2018-02-24 DIAGNOSIS — Z974 Presence of external hearing-aid: Secondary | ICD-10-CM | POA: Diagnosis not present

## 2018-03-18 DIAGNOSIS — M48062 Spinal stenosis, lumbar region with neurogenic claudication: Secondary | ICD-10-CM | POA: Diagnosis not present

## 2018-03-18 DIAGNOSIS — M4714 Other spondylosis with myelopathy, thoracic region: Secondary | ICD-10-CM | POA: Diagnosis not present

## 2018-03-24 DIAGNOSIS — D538 Other specified nutritional anemias: Secondary | ICD-10-CM | POA: Diagnosis not present

## 2018-03-24 DIAGNOSIS — E538 Deficiency of other specified B group vitamins: Secondary | ICD-10-CM | POA: Diagnosis not present

## 2018-03-24 DIAGNOSIS — M810 Age-related osteoporosis without current pathological fracture: Secondary | ICD-10-CM | POA: Diagnosis not present

## 2018-03-24 DIAGNOSIS — I10 Essential (primary) hypertension: Secondary | ICD-10-CM | POA: Diagnosis not present

## 2018-03-24 DIAGNOSIS — R82998 Other abnormal findings in urine: Secondary | ICD-10-CM | POA: Diagnosis not present

## 2018-03-24 DIAGNOSIS — E7849 Other hyperlipidemia: Secondary | ICD-10-CM | POA: Diagnosis not present

## 2018-03-24 DIAGNOSIS — D649 Anemia, unspecified: Secondary | ICD-10-CM | POA: Diagnosis not present

## 2018-03-26 DIAGNOSIS — M545 Low back pain: Secondary | ICD-10-CM | POA: Diagnosis not present

## 2018-03-26 DIAGNOSIS — I10 Essential (primary) hypertension: Secondary | ICD-10-CM | POA: Diagnosis not present

## 2018-03-26 DIAGNOSIS — Z0000 Encounter for general adult medical examination without abnormal findings: Secondary | ICD-10-CM | POA: Diagnosis not present

## 2018-03-26 DIAGNOSIS — L84 Corns and callosities: Secondary | ICD-10-CM | POA: Diagnosis not present

## 2018-03-26 DIAGNOSIS — Z23 Encounter for immunization: Secondary | ICD-10-CM | POA: Diagnosis not present

## 2018-04-07 ENCOUNTER — Encounter: Payer: Self-pay | Admitting: Obstetrics & Gynecology

## 2018-04-07 ENCOUNTER — Ambulatory Visit: Payer: Medicare Other | Admitting: Obstetrics & Gynecology

## 2018-04-07 VITALS — BP 128/80 | Ht 61.25 in | Wt 115.0 lb

## 2018-04-07 DIAGNOSIS — Z01419 Encounter for gynecological examination (general) (routine) without abnormal findings: Secondary | ICD-10-CM | POA: Diagnosis not present

## 2018-04-07 DIAGNOSIS — N393 Stress incontinence (female) (male): Secondary | ICD-10-CM | POA: Diagnosis not present

## 2018-04-07 DIAGNOSIS — Z780 Asymptomatic menopausal state: Secondary | ICD-10-CM | POA: Diagnosis not present

## 2018-04-07 DIAGNOSIS — M810 Age-related osteoporosis without current pathological fracture: Secondary | ICD-10-CM | POA: Diagnosis not present

## 2018-04-07 DIAGNOSIS — Z1212 Encounter for screening for malignant neoplasm of rectum: Secondary | ICD-10-CM | POA: Diagnosis not present

## 2018-04-07 NOTE — Progress Notes (Signed)
Shannon Berger [birth year] [id number]   History:    77 y.o. G2P2L2 Married  RP:  Established patient presenting for annual gyn exam   HPI: Menopause, well on no HRT.  No PMB.  No pelvic pain.  Rarely sexually active, uncomfortable.  Coconut oil recommended for IC.  SUI, wears a pad.  Tendency for constipation, on Senokot.  Breasts wnl.  BMI 21.55.  Starting water fitness, because of severe back pain/back surgeries (2).  Health labs with Fam MD.  Past medical history,surgical history, family history and social history were all reviewed and documented in the EPIC chart.  Gynecologic History No LMP recorded. Patient is postmenopausal. Contraception: post menopausal status Last Pap: 03/2017. Results were: Negative Last mammogram: 10/2017. Results were: Benign Bone Density: Osteoporosis on Boniva.  Elbow fracture after a fall in 2018.  Bone Density scheduled by Fam MD Colonoscopy: 2014  Obstetric History OB History  Gravida Para Term Preterm AB Living  2 2       2   SAB TAB Ectopic Multiple Live Births               # Outcome Date GA Lbr Len/2nd Weight Sex Delivery Anes PTL Lv  2 Para           1 Para              ROS: A ROS was performed and pertinent positives and negatives are included in the history.  GENERAL: No fevers or chills. HEENT: No change in vision, no earache, sore throat or sinus congestion. NECK: No pain or stiffness. CARDIOVASCULAR: No chest pain or pressure. No palpitations. PULMONARY: No shortness of breath, cough or wheeze. GASTROINTESTINAL: No abdominal pain, nausea, vomiting or diarrhea, melena or bright red blood per rectum. GENITOURINARY: No urinary frequency, urgency, hesitancy or dysuria. MUSCULOSKELETAL: No joint or muscle pain, no back pain, no recent trauma. DERMATOLOGIC: No rash, no itching, no lesions. ENDOCRINE: No polyuria, polydipsia, no heat or cold intolerance. No recent change in weight. HEMATOLOGICAL: No anemia or easy bruising or bleeding.  NEUROLOGIC: No headache, seizures, numbness, tingling or weakness. PSYCHIATRIC: No depression, no loss of interest in normal activity or change in sleep pattern.     Exam:   BP 128/80   Ht 5' 1.25" (1.556 m)   Wt 115 lb (52.2 kg)   BMI 21.55 kg/m   Body mass index is 21.55 kg/m.  General appearance : Well developed well nourished female. No acute distress HEENT: Eyes: no retinal hemorrhage or exudates,  Neck supple, trachea midline, no carotid bruits, no thyroidmegaly Lungs: Clear to auscultation, no rhonchi or wheezes, or rib retractions  Heart: Regular rate and rhythm, no murmurs or gallops Breast:Examined in sitting and supine position were symmetrical in appearance, no palpable masses or tenderness,  no skin retraction, no nipple inversion, no nipple discharge, no skin discoloration, no axillary or supraclavicular lymphadenopathy Abdomen: no palpable masses or tenderness, no rebound or guarding Extremities: no edema or skin discoloration or tenderness  Pelvic: Vulva: Normal             Vagina: No gross lesions or discharge  Cervix: No gross lesions or discharge  Uterus  AV, normal size, shape and consistency, non-tender and mobile  Adnexa  Without masses or tenderness  Anus: Normal   Assessment/Plan:  77 y.o. female for annual exam   1. Well female exam with routine gynecological exam Normal gynecologic exam and menopause.  Pap test negative in September 2018.  No  indication to repeat this year.  Breast exam normal.  Last mammogram benign in April 2019.  Good body mass index at 21.55.  Will do water fitness because of back issues.  Health labs with family physician.  Colonoscopy in 2014.  2. Post-menopause Well on no hormone replacement therapy.  No postmenopausal bleeding.  3. Age-related osteoporosis without current pathological fracture Currently on Boniva.  Had a left elbow fracture after a minor fall in 2018.  Bone density scheduled through her family physician.  Will  review results when available.  Vitamin D supplements, calcium intake of 1.5 g/day and regular weightbearing physical activity recommended.  4. SUI (stress urinary incontinence, female) Regular urination about every 3 hours to avoid overfilling of the bladder recommended.  Avoid pelvic pressure by treating cough and constipation and avoiding over lifting.  Kegel exercises recommended and instructed.  Counseling on above issues and coordination of care more than 50% for 15 minutes.  Princess Bruins MD, 8:26 AM 04/07/2018

## 2018-04-07 NOTE — Patient Instructions (Signed)
1. Well female exam with routine gynecological exam Normal gynecologic exam and menopause.  Pap test negative in September 2018.  No indication to repeat this year.  Breast exam normal.  Last mammogram benign in April 2019.  Good body mass index at 21.55.  Will do water fitness because of back issues.  Health labs with family physician.  Colonoscopy in 2014.  2. Post-menopause Well on no hormone replacement therapy.  No postmenopausal bleeding.  3. Age-related osteoporosis without current pathological fracture Currently on Boniva.  Had a left elbow fracture after a minor fall in 2018.  Bone density scheduled through her family physician.  Will review results when available.  Vitamin D supplements, calcium intake of 1.5 g/day and regular weightbearing physical activity recommended.  4. SUI (stress urinary incontinence, female) Regular urination about every 3 hours to avoid overfilling of the bladder recommended.  Avoid pelvic pressure by treating cough and constipation and avoiding over lifting.  Kegel exercises recommended and instructed.  Shannon Berger, it was a pleasure seeing you today!   Kegel Exercises Kegel exercises help strengthen the muscles that support the rectum, vagina, small intestine, bladder, and uterus. Doing Kegel exercises can help:  Improve bladder and bowel control.  Improve sexual response.  Reduce problems and discomfort during pregnancy.  Kegel exercises involve squeezing your pelvic floor muscles, which are the same muscles you squeeze when you try to stop the flow of urine. The exercises can be done while sitting, standing, or lying down, but it is best to vary your position. Phase 1 exercises 1. Squeeze your pelvic floor muscles tight. You should feel a tight lift in your rectal area. If you are a female, you should also feel a tightness in your vaginal area. Keep your stomach, buttocks, and legs relaxed. 2. Hold the muscles tight for up to 10 seconds. 3. Relax your  muscles. Repeat this exercise 50 times a day or as many times as told by your health care provider. Continue to do this exercise for at least 4-6 weeks or for as long as told by your health care provider. This information is not intended to replace advice given to you by your health care provider. Make sure you discuss any questions you have with your health care provider. Document Released: 06/10/2012 Document Revised: 02/17/2016 Document Reviewed: 05/14/2015 Elsevier Interactive Patient Education  [employer].

## 2018-04-09 ENCOUNTER — Ambulatory Visit: Payer: Medicare Other | Admitting: Physician Assistant

## 2018-04-09 ENCOUNTER — Encounter: Payer: Self-pay | Admitting: Physician Assistant

## 2018-04-09 VITALS — BP 166/86 | HR 86 | Ht 61.25 in | Wt 115.6 lb

## 2018-04-09 DIAGNOSIS — I6523 Occlusion and stenosis of bilateral carotid arteries: Secondary | ICD-10-CM

## 2018-04-09 DIAGNOSIS — Z01812 Encounter for preprocedural laboratory examination: Secondary | ICD-10-CM | POA: Diagnosis not present

## 2018-04-09 DIAGNOSIS — R0609 Other forms of dyspnea: Secondary | ICD-10-CM | POA: Diagnosis not present

## 2018-04-09 DIAGNOSIS — Z961 Presence of intraocular lens: Secondary | ICD-10-CM | POA: Diagnosis not present

## 2018-04-09 DIAGNOSIS — E785 Hyperlipidemia, unspecified: Secondary | ICD-10-CM

## 2018-04-09 DIAGNOSIS — R072 Precordial pain: Secondary | ICD-10-CM

## 2018-04-09 DIAGNOSIS — I10 Essential (primary) hypertension: Secondary | ICD-10-CM | POA: Diagnosis not present

## 2018-04-09 DIAGNOSIS — R0600 Dyspnea, unspecified: Secondary | ICD-10-CM

## 2018-04-09 DIAGNOSIS — H47011 Ischemic optic neuropathy, right eye: Secondary | ICD-10-CM | POA: Diagnosis not present

## 2018-04-09 DIAGNOSIS — H35371 Puckering of macula, right eye: Secondary | ICD-10-CM | POA: Diagnosis not present

## 2018-04-09 MED ORDER — BISOPROLOL FUMARATE 5 MG PO TABS: 5.0000 mg | ORAL_TABLET | Freq: Every day | ORAL | Status: DC

## 2018-04-09 NOTE — Progress Notes (Signed)
Cardiology Office Note    Date:  04/09/2018   ID:  Cheyna, Retana [birth year], MRN [id number]  PCP:  [name], MD  Cardiologist:  New - last seen by Dr. Martinique 03/20/2015  Chief Complaint  Patient presents with  . New Patient (Initial Visit)    remotely seen by Dr. Martinique. Dyspnea on exertion    History of Present Illness:  Shannon Berger is a 77 y.o. female with past medical history of carotid artery disease, hypertension, hyperlipidemia, ischemic optic neuropathy, and osteopenia.  Patient was previously referred to Dr. Martinique in 2016 for evaluation of chest pain.  She was complaining of chest pain not related to exertion at the time.  She had a similar symptom in the past with normal stress echo in 2012 and normal ETT in 2008.  She eventually underwent a Myoview on 03/24/2015 which showed EF 68%, normal perfusion, overall low risk study.  Patient has not been seen by cardiology service since.  Her carotid artery disease has been followed by Dr. [name] of vascular surgery.  Last carotid ultrasound obtained on 01/07/2018 showed mild disease on the right 1 to 39%, 60 to 79% stenosis in the left ICA.  Patient presents today for cardiology office visit.  1 of her main issue is dyspnea on exertion.  She says she had a lot of anxiety after her 3 back operations, some of her dyspnea may be related to anxiety issue.  She also has significant scoliosis that can cause shortness of breath due to poor lung expansion as well.  She probably had a pulmonary function test yesterday, however I am unable to see it in our system.  Recent lab work shows borderline anemia with hemoglobin greater than 10, however unable to explain her symptoms.  She denies any orthopnea, lower extremity edema, or PND, she says her symptom has been more noticeable within the past year.  I recommended echocardiogram as initial assessment.  I do not hear any significant heart murmur, my suspicion for valvular induced  shortness of breath is very low.  My concern is she might have anginal equivalent based on her symptoms.  I would recommend a coronary CT with FFR for further assessment.  She does have some degree of precordial chest pain, this is similar to her chest pain back in 2016 and only occurs about 3 times in the past 3 months.  This does not occur with exertion, therefore I do not think her chest pain is necessarily cardiac in nature, I am more worried about her shortness of breath with exertion.   Past Medical History:  Diagnosis Date  . Arthritis   . Carotid arterial disease ([place])   . Constipation   . Degeneration of lumbar intervertebral disc   . Dislocation of hip joint prosthesis ([place])   . Fusion of spine of lumbar region   . Hyperlipidemia   . Hypertension   . Low back pain    Radiating to left leg  . Osteoarthritis   . Osteopenia   . Sleep difficulties   . Spinal stenosis at L4-L5 level     Past Surgical History:  Procedure Laterality Date  . BACK SURGERY    . BUNIONECTOMY    . EYE SURGERY    . FOOT SURGERY     Status post right foot surgery  . HAND FUSION     Right thumb  . JOINT REPLACEMENT Left 2014   Hip  . LUMBAR FUSION  Anterior and posterior  . OTHER SURGICAL HISTORY     post fusion of her right thumb  . PILONIDAL CYST / SINUS EXCISION    . TENDON REPAIR     left hip  . TENDON REPAIR     Reattachment of tendon- Left hip  . TONSILLECTOMY AND ADENOIDECTOMY    . TOTAL HIP ARTHROPLASTY Left 09/04/2012   Procedure: LEFT TOTAL HIP ARTHROPLASTY ANTERIOR APPROACH;  Surgeon: Mcarthur Rossetti, MD;  Location: [facility];  Service: Orthopedics;  Laterality: Left;  Left Total Hip Arthroplasty, Anterior Approach    Current Medications: Outpatient Medications Prior to Visit  Medication Sig Dispense Refill  . acetaminophen (TYLENOL) 500 MG tablet Take 325 mg by mouth.     . ALPRAZolam (XANAX) 0.25 MG tablet Take 0.125 mg by mouth at bedtime as needed. Take 1/2 tablet po  PRN QHS insomnia or anxiety    . Ascorbic Acid (VITAMIN C PO) Take 1,000 mg by mouth at bedtime.     Marland Kitchen aspirin EC 81 MG tablet Take 81 mg by mouth at bedtime.     Marland Kitchen atorvastatin (LIPITOR) 40 MG tablet Take 40 mg by mouth daily.  3  . buPROPion (WELLBUTRIN XL) 300 MG 24 hr tablet Take 300 mg by mouth daily.    . Calcium Carb-Cholecalciferol 500-600 MG-UNIT TABS Take by mouth.    . calcium carbonate (OS-CAL) 600 MG tablet Take 600 mg by mouth daily.    . Cholecalciferol (VITAMIN D) 2000 units tablet Take 2,000 Units by mouth as directed.    . ezetimibe (ZETIA) 10 MG tablet Take 10 mg by mouth daily.    . hydrochlorothiazide (HYDRODIURIL) 25 MG tablet TK 1 T QD  3  . ibandronate (BONIVA) 150 MG tablet TK 1 T PO ONCE A MONTH AS DIRECTED    . losartan (COZAAR) 50 MG tablet Take 50 mg by mouth daily.    . meloxicam (MOBIC) 15 MG tablet Take 15 mg by mouth as needed.     . Multiple Vitamin (MULTIVITAMIN) tablet Take 1 tablet by mouth daily.      . Omega-3 Fatty Acids (FISH OIL PO) Take 1,400 mg by mouth 2 (two) times daily.     Marland Kitchen omeprazole (PRILOSEC) 20 MG capsule Take 20 mg by mouth daily.    Marland Kitchen senna (SENOKOT) 8.6 MG TABS tablet Take 1 tablet by mouth at bedtime as needed for mild constipation.    Marland Kitchen venlafaxine (EFFEXOR) 25 MG tablet Take 75 mg by mouth daily.     . bisoprolol-hydrochlorothiazide (ZIAC) 2.5-6.25 MG per tablet Take 1 tablet by mouth every morning.     Marland Kitchen buPROPion (WELLBUTRIN XL) 150 MG 24 hr tablet Take 150 mg by mouth daily.  3  . Cholecalciferol (D3-1000) 1000 units tablet Take 1,000 Units by mouth daily.     No facility-administered medications prior to visit.      Allergies:   Oxycodone-acetaminophen; Celebrex [celecoxib]; Hydrocodone; Hydrocodone-acetaminophen; and Oxycodone   Social History   Socioeconomic History  . Marital status: Married    Spouse name: Not on file  . Number of children: 2  . Years of education: Not on file  . Highest education level: Not on file   Occupational History  . 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 Financial resource strain: Not on file  . Food insecurity:    Worry: Not on file    Inability: Not on file  .  Transportation needs:    Medical: Not on file    Non-medical: Not on file  Tobacco Use  . Smoking status: Never Smoker  . Smokeless tobacco: Never Used  Substance and Sexual Activity  . Alcohol use: Yes    Alcohol/week: 0.0 standard drinks    Comment: 1 gl wine biweekly  . Drug use: No  . Sexual activity: Yes    Partners: Male    Comment: 1st intercourse- 66, partners- 33, married- 7 yrs   Lifestyle  . Physical activity:    Days per week: Not on file    Minutes per session: Not on file  . Stress: Not on file  Relationships  . Social connections:    Talks on phone: Not on file    Gets together: Not on file    Attends religious service: Not on file    Active member of club or organization: Not on file    Attends meetings of clubs or organizations: Not on file    Relationship status: Not on file  Other Topics Concern  . Not on file  Social History Narrative  . Not on file     Family History:  The patient's family history includes Cancer in her brother and mother; Diabetes in her father; Heart attack in her father; Heart disease in her father; Heart failure in her mother; Hypertension in her father and mother; Other in her mother.   ROS:   Please see the history of present illness.    ROS All other systems reviewed and are negative.   PHYSICAL EXAM:   VS:  BP (!) 166/86   Pulse 86   Ht 5' 1.25" (1.556 m)   Wt 115 lb 9.6 oz (52.4 kg)   SpO2 96%   BMI 21.66 kg/m    GEN: Well nourished, well developed, in no acute distress  HEENT: normal  Neck: no JVD, carotid bruits, or masses Cardiac: RRR; no murmurs, rubs, or gallops,no edema  Respiratory:  clear to auscultation bilaterally, normal work of breathing GI: soft, nontender, nondistended, +  BS MS: no deformity or atrophy  Skin: warm and dry, no rash Neuro:  Alert and Oriented x 3, Strength and sensation are intact Psych: euthymic mood, full affect  Wt Readings from Last 3 Encounters:  04/09/18 115 lb 9.6 oz (52.4 kg)  04/07/18 115 lb (52.2 kg)  01/07/18 115 lb (52.2 kg)      Studies/Labs Reviewed:   EKG:  EKG is ordered today.  The ekg ordered today demonstrates sinus bradycardia, heart rate 57, no significant ST-T wave changes.  Recent Labs: No results found for requested labs within last 8760 hours.   Lipid Panel No results found for: CHOL, TRIG, HDL, CHOLHDL, VLDL, LDLCALC, LDLDIRECT  Additional studies/ records that were reviewed today include:   Myoview 03/24/2015 Study Highlights    The left ventricular ejection fraction is hyperdynamic (>65%).  Nuclear stress EF: 68%.  There was no ST segment deviation noted during stress.  The study is normal.  This is a low risk study.  Normal myocardial perfusion and function   Low risk lexiscan nuclear stress test demonstrating normal myocardial perfusion and function; EF 68%,       ASSESSMENT:    1. DOE (dyspnea on exertion)   2. Pre-procedure lab exam   3. Bilateral carotid artery stenosis   4. Essential hypertension   5. Hyperlipidemia, unspecified hyperlipidemia type   6. Precordial chest pain      PLAN:  In order  of problems listed above:  1. Dyspnea on exertion: Has been going on for the past year.  I discussed the case with DOD Dr. Harrell Gave, I recommend a initial work-up with echocardiogram.  I am also concerned of anginal equivalent.  Her chest pain is somewhat atypical and does not correlate with exertion and it has been going on for the since 2016.  I am more concerned of the dyspnea on exertion.  Her husband has been noticing she gets more short of breath after climbing up a flight of stairs in the past year.  I recommend coronary CT with FFR for further assessment.  Some of her  shortness of breath may be related to severe scoliosis which restricted the lung expansion.  2. Precordial chest pain: Not related to exertion.  Similar to what she experienced in 2016.  3. Bilateral carotid artery disease: Followed by vascular surgery.  Moderate disease on the left, mild disease on the right  4. Hypertension: Blood pressure stable  5. Hyperlipidemia: Lipitor 40 mg daily.  Lipid panel followed by primary care provider.  Last lipid panel was in September 2019.  HDL 73, LDL 72, total cholesterol 158, triglyceride 65    Medication Adjustments/Labs and Tests Ordered: Current medicines are reviewed at length with the patient today.  Concerns regarding medicines are outlined above.  Medication changes, Labs and Tests ordered today are listed in the Patient Instructions below. Patient Instructions  Medication Instructions:  Your physician recommends that you continue on your current medications as directed. Please refer to the Current Medication list given to you today.    Labwork: Please return to our office for lab work (Bmet) within 7 days prior to your Cardiac CT  Testing/Procedures: Your physician has requested that you have an echocardiogram. Echocardiography is a painless test that uses sound waves to create images of your heart. It provides your doctor with information about the size and shape of your heart and how well your heart's chambers and valves are working. This procedure takes approximately one hour. There are no restrictions for this procedure.  Your physician has requested that you have cardiac CT. Cardiac computed tomography (CT) is a painless test that uses an x-ray machine to take clear, detailed pictures of your heart. For further information please visit [email or web address]. Please follow instruction sheet as given.     Follow-Up: Your physician recommends that you schedule a follow-up appointment in: 3 months with Dr.Jordan   Any Other Special  Instructions Will Be Listed Below (If Applicable).     If you need a refill on your cardiac medications before your next appointment, please call your pharmacy.  Please arrive at the [facility] main entrance of [facility] at TBD (30-45 minutes prior to test start time)  [facility] [place], [place] [address] [phone]  Proceed to the [facility] Radiology Department (First Floor).  Please follow these instructions carefully (unless otherwise directed):   On the Night Before the Test: . Be sure to Drink plenty of water. . Do not consume any caffeinated/decaffeinated beverages or chocolate 12 hours prior to your test. . Do not take any antihistamines 12 hours prior to your test.  On the Day of the Test: . Drink plenty of water. Do not drink any water within one hour of the test. . Do not eat any food 4 hours prior to the test. . You may take your regular medications prior to the test.  After the Test: . Drink plenty of water. . After receiving IV contrast, you may experience a mild flushed feeling. This is normal. . On occasion, you may experience a mild rash up to 24 hours after the test. This is not dangerous. If this occurs, you can take Benadryl 25 mg and increase your fluid intake. . If you experience trouble breathing, this can be serious. If it is severe call 911 IMMEDIATELY. If it is mild, please call our office. . If you take any of these medications: Glipizide/Metformin, Avandament, Glucavance, please do not take 48 hours after completing test.       Signed, Almyra Deforest, [place]  04/09/2018 5:35 PM    [facility] [address], [place], [place]  [address] Phone: [phone]; Fax: [phone]

## 2018-04-09 NOTE — Patient Instructions (Addendum)
Medication Instructions:  Your physician recommends that you continue on your current medications as directed. Please refer to the Current Medication list given to you today.    Labwork: Please return to our office for lab work (Bmet) within 7 days prior to your Cardiac CT  Testing/Procedures: Your physician has requested that you have an echocardiogram. Echocardiography is a painless test that uses sound waves to create images of your heart. It provides your doctor with information about the size and shape of your heart and how well your heart's chambers and valves are working. This procedure takes approximately one hour. There are no restrictions for this procedure.  Your physician has requested that you have cardiac CT. Cardiac computed tomography (CT) is a painless test that uses an x-ray machine to take clear, detailed pictures of your heart. For further information please visit [facility][email or web address]. Please follow instruction sheet as given.     Follow-Up: Your physician recommends that you schedule a follow-up appointment in: 3 months with Dr.Jordan   Any Other Special Instructions Will Be Listed Below (If Applicable).     If you need a refill on your cardiac medications before your next appointment, please call your pharmacy.  Please arrive at the [facility] main entrance of [facility] at TBD (30-45 minutes prior to test start time)  [facility] [place], [place] [address] [phone]  Proceed to the [facility] Radiology Department (First Floor).  Please follow these instructions carefully (unless otherwise directed):   On the Night Before the Test: . Be sure to Drink plenty of water. . Do not consume any caffeinated/decaffeinated beverages or chocolate 12 hours prior to your test. . Do not take any antihistamines 12 hours prior to your test.  On the Day of the Test: . Drink plenty of water. Do not drink any water within one  hour of the test. . Do not eat any food 4 hours prior to the test. . You may take your regular medications prior to the test.                After the Test: . Drink plenty of water. . After receiving IV contrast, you may experience a mild flushed feeling. This is normal. . On occasion, you may experience a mild rash up to 24 hours after the test. This is not dangerous. If this occurs, you can take Benadryl 25 mg and increase your fluid intake. . If you experience trouble breathing, this can be serious. If it is severe call 911 IMMEDIATELY. If it is mild, please call our office. . If you take any of these medications: Glipizide/Metformin, Avandament, Glucavance, please do not take 48 hours after completing test.

## 2018-04-13 ENCOUNTER — Other Ambulatory Visit: Payer: Self-pay

## 2018-04-13 ENCOUNTER — Ambulatory Visit (HOSPITAL_COMMUNITY): Payer: Medicare Other | Attending: Cardiovascular Disease

## 2018-04-13 ENCOUNTER — Telehealth: Payer: Self-pay | Admitting: Physician Assistant

## 2018-04-13 DIAGNOSIS — I519 Heart disease, unspecified: Secondary | ICD-10-CM | POA: Diagnosis not present

## 2018-04-13 DIAGNOSIS — I10 Essential (primary) hypertension: Secondary | ICD-10-CM | POA: Insufficient documentation

## 2018-04-13 DIAGNOSIS — R0609 Other forms of dyspnea: Secondary | ICD-10-CM | POA: Diagnosis not present

## 2018-04-13 DIAGNOSIS — R0600 Dyspnea, unspecified: Secondary | ICD-10-CM

## 2018-04-13 DIAGNOSIS — Z8249 Family history of ischemic heart disease and other diseases of the circulatory system: Secondary | ICD-10-CM | POA: Diagnosis not present

## 2018-04-13 DIAGNOSIS — E785 Hyperlipidemia, unspecified: Secondary | ICD-10-CM | POA: Diagnosis not present

## 2018-04-13 IMAGING — CR DG CHEST 2V
2 series · 2 of 2 positions shown · non-contrast
Comparison: PA and lateral chest 08/21/2012.

CLINICAL DATA: Shortness of breath for 6 weeks since low back
surgery.

EXAM:
CHEST  2 VIEW

[chest pa]
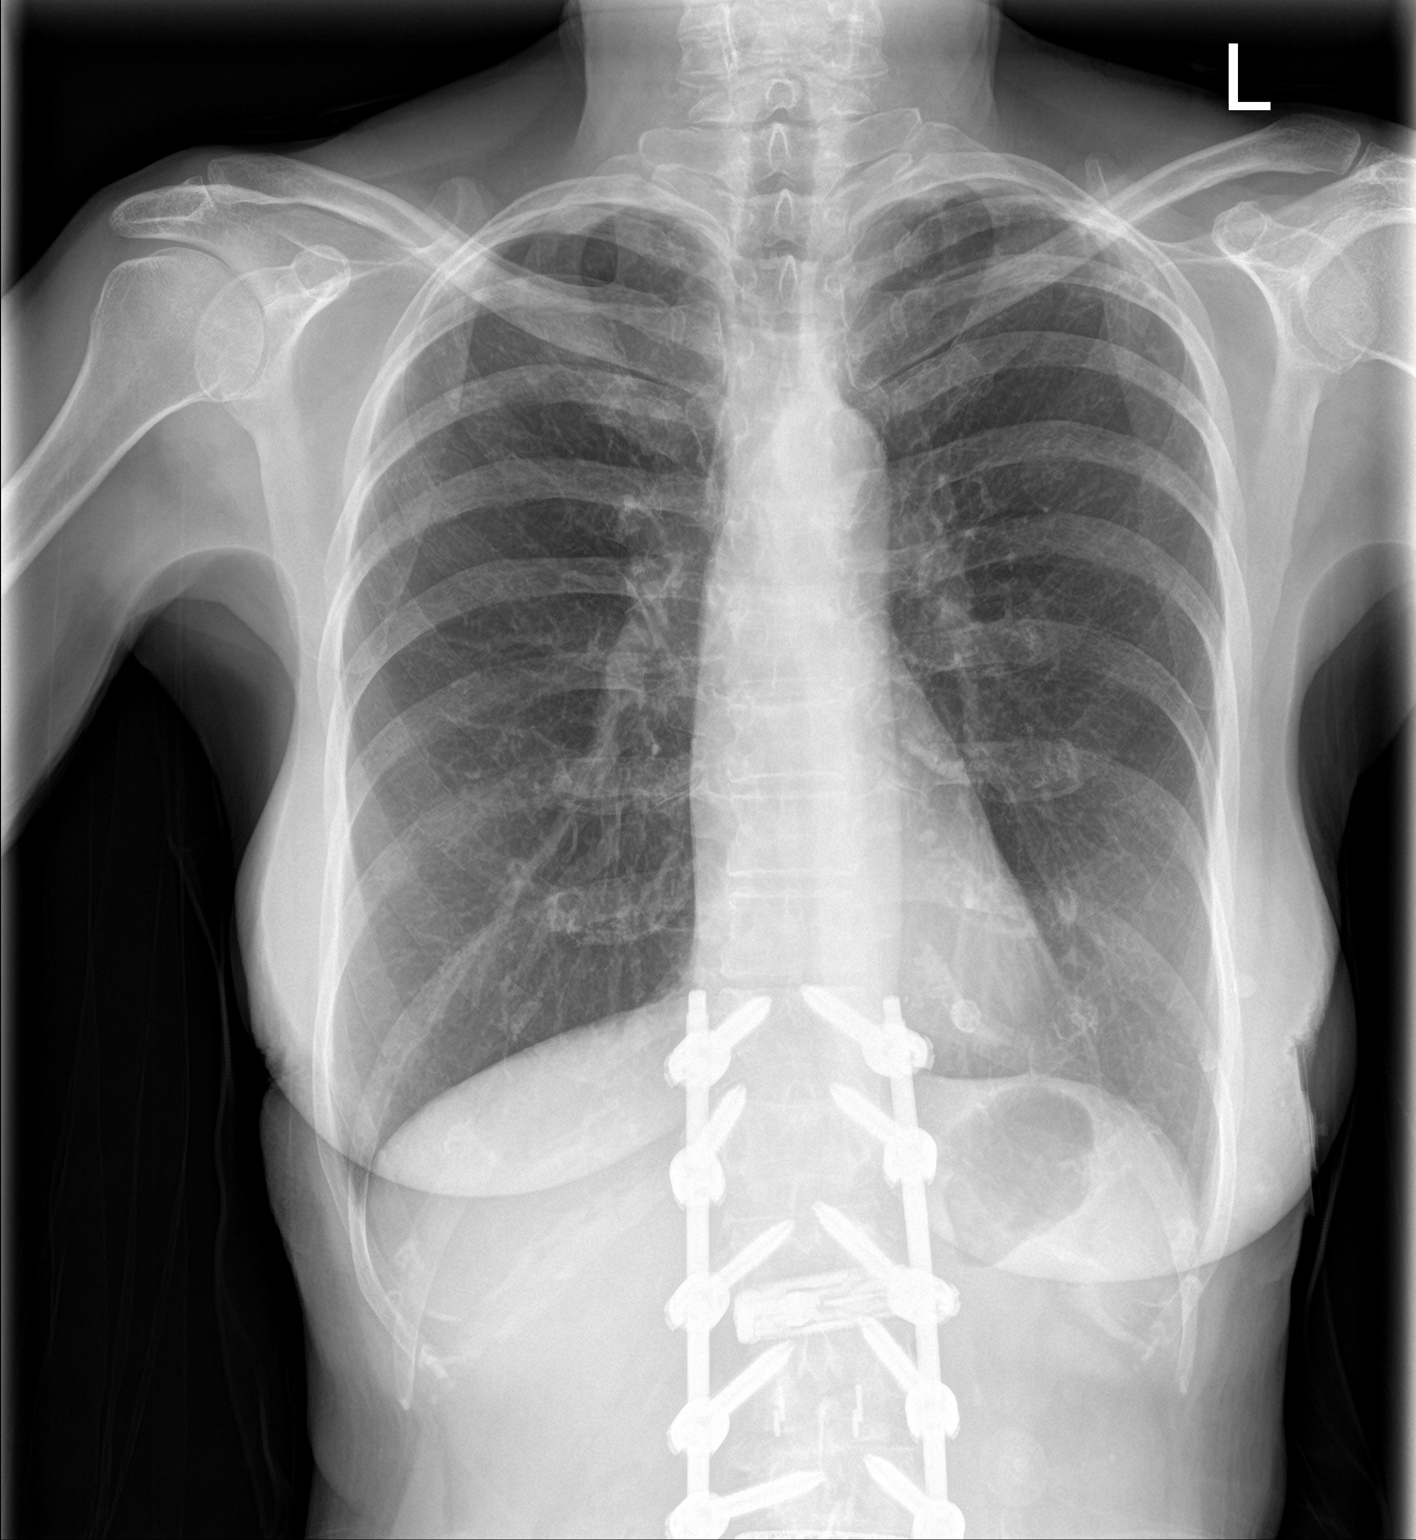

[chest lat]
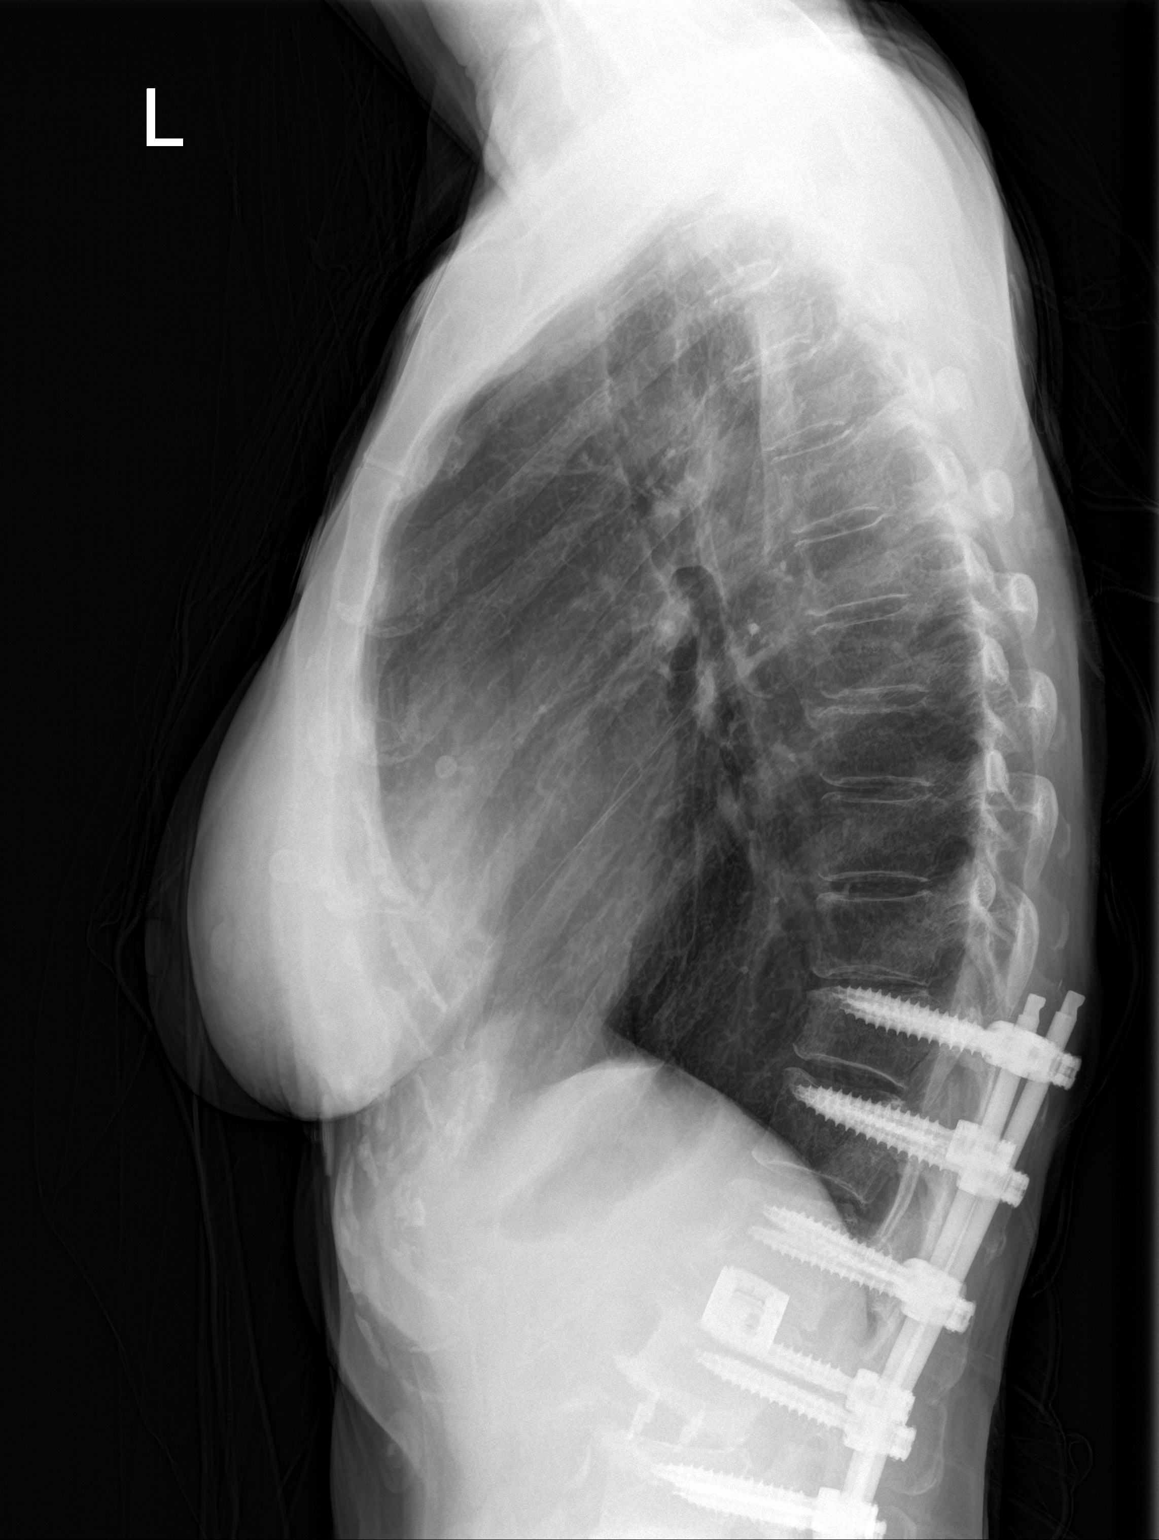

[2 of 2 positions shown; findings below may reference images not displayed]

FINDINGS: The lungs are clear. Heart size is normal. No pneumothorax or
pleural effusion. Lower thoracic fusion hardware extends into the
lumbar spine and below the inferior margin of film.
IMPRESSION: No acute disease.

## 2018-04-13 IMAGING — CT CT ANGIO CHEST
2 of 6 series · 19 of 36 positions shown · IV contrast (Omni 300)
Comparison: None.

CLINICAL DATA: Shortness of Breath

EXAM:
CT ANGIOGRAPHY CHEST WITH CONTRAST
TECHNIQUE: Multidetector CT imaging of the chest was performed using the
standard protocol during bolus administration of intravenous
contrast. Multiplanar CT image reconstructions and MIPs were
obtained to evaluate the vascular anatomy.
CONTRAST:  100 cc Isovue

[Series 8: pe thins · axial · 0.64mm/px · z∈[+1230,+1490]mm · 18 of 574 slices shown]
[im 28/574  lung]
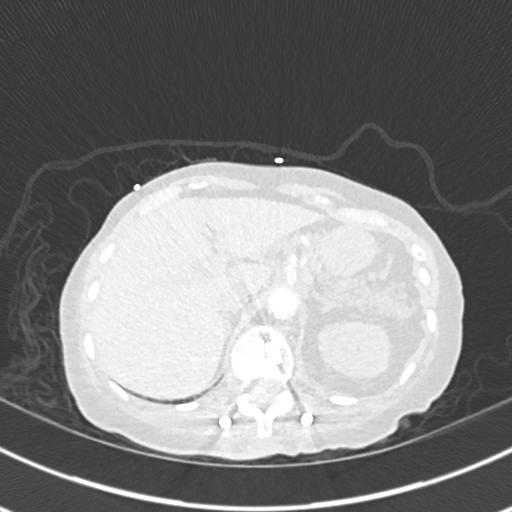
[im 55/574  mediastinal]
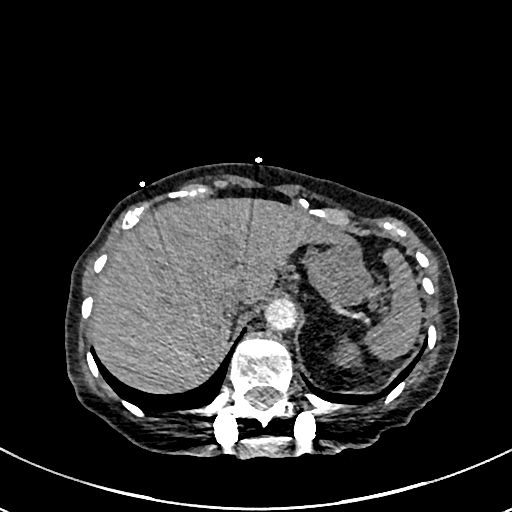
[im 82/574  lung]
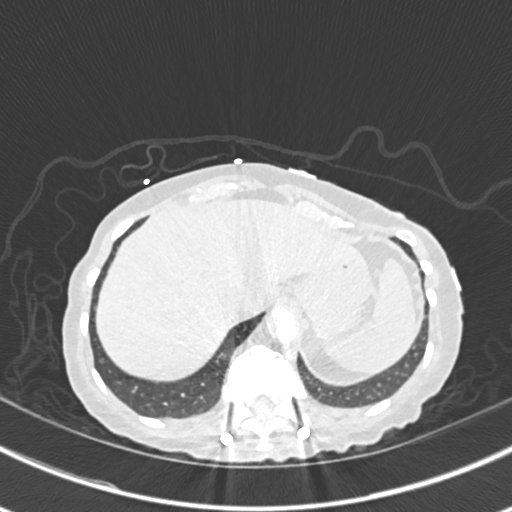
[im 110/574  mediastinal]
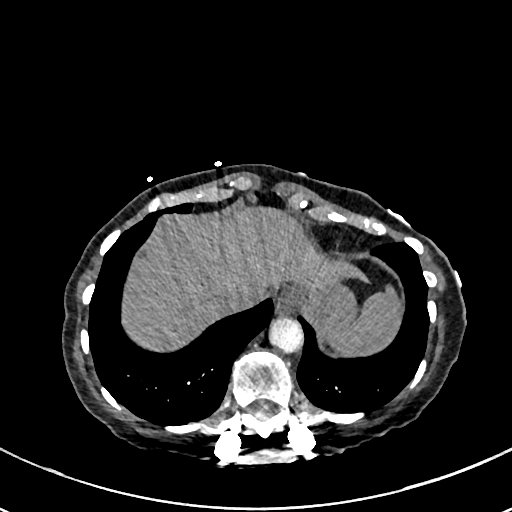
[im 164/574  lung]
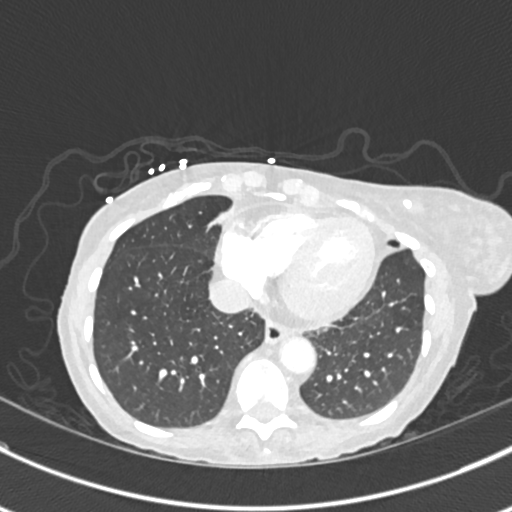
[im 192/574  mediastinal]
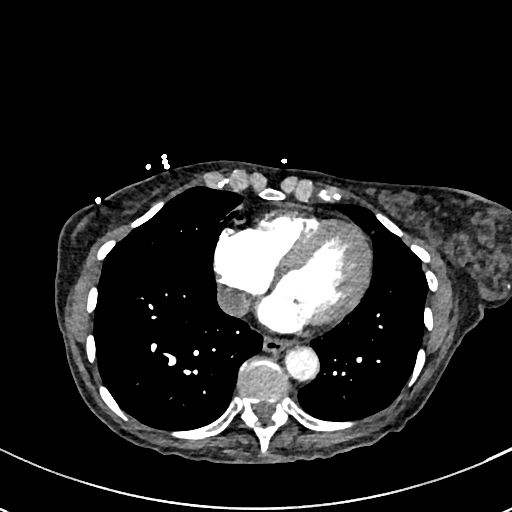
[im 219/574  lung]
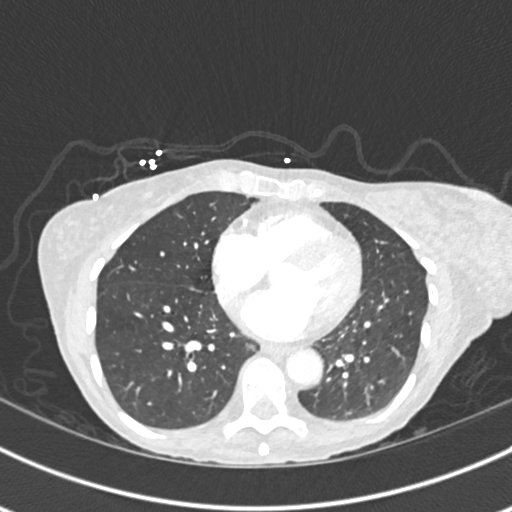
[im 246/574  mediastinal]
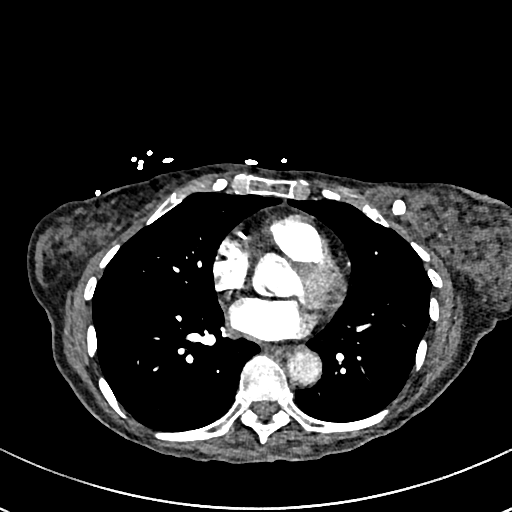
[im 273/574  lung]
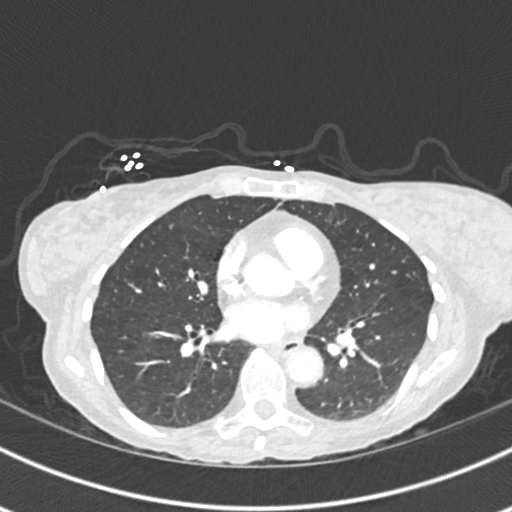
[im 301/574  mediastinal]
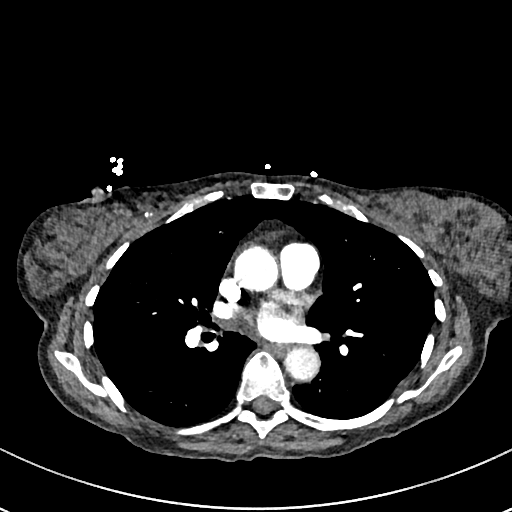
[im 328/574  lung]
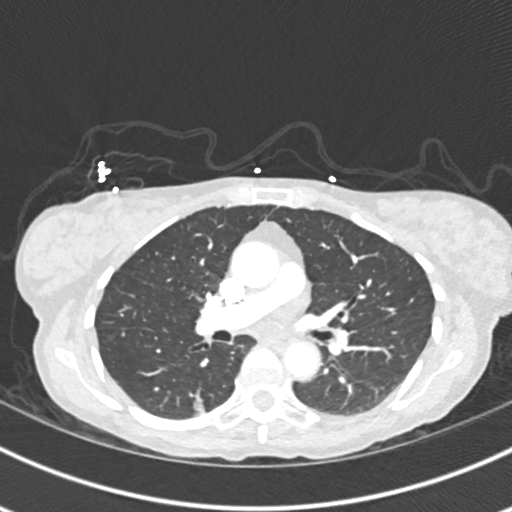
[im 355/574  mediastinal]
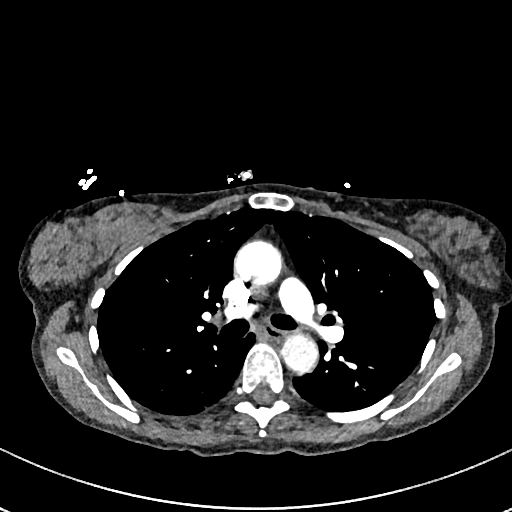
[im 383/574  lung]
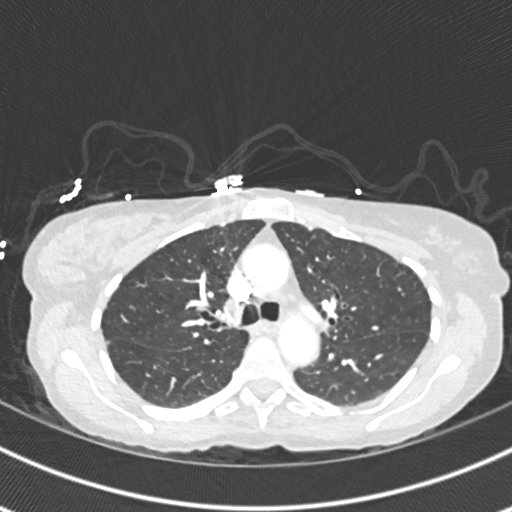
[im 437/574  mediastinal]
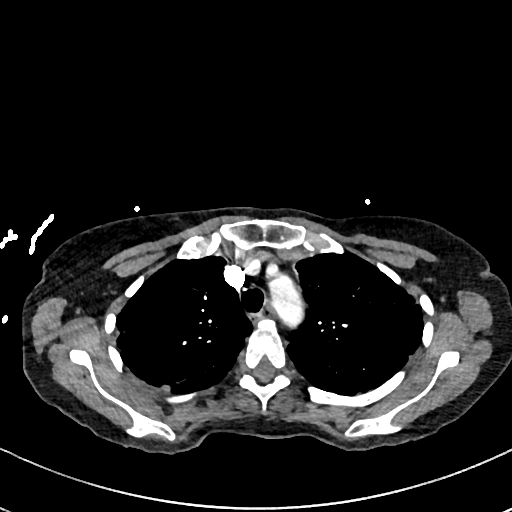
[im 464/574  lung]
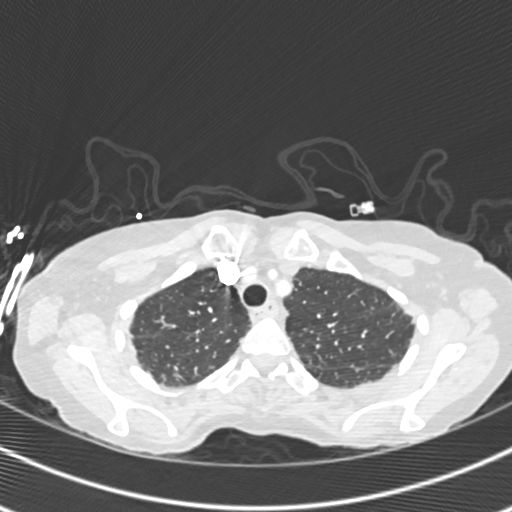
[im 492/574  mediastinal]
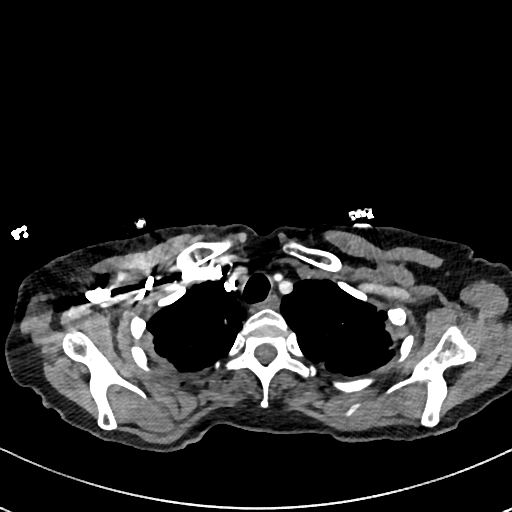
[im 519/574  lung]
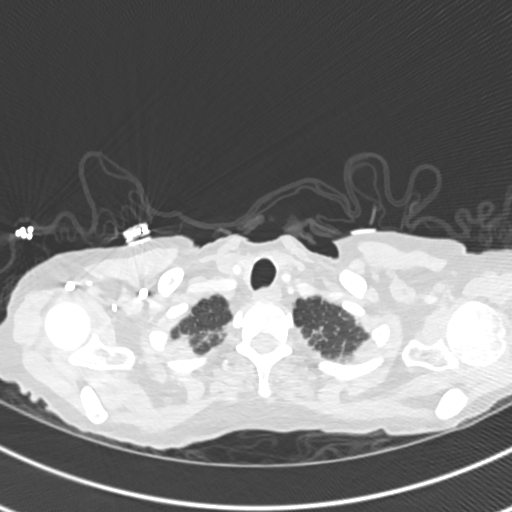
[im 546/574  mediastinal]
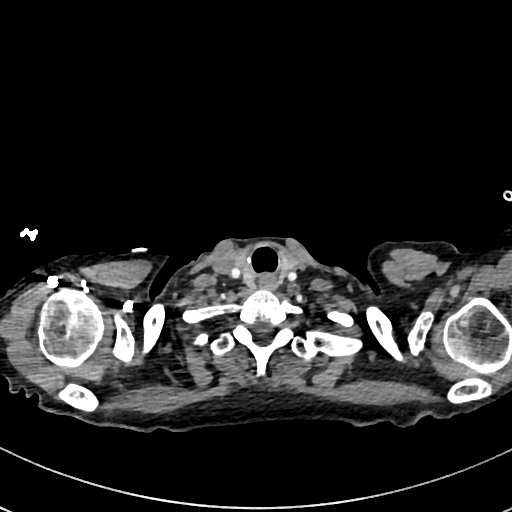

[Series 9: pe 2mm cor · coronal · 0.59mm/px · 1 of 105 slices shown]
[im 53/105  mediastinal]
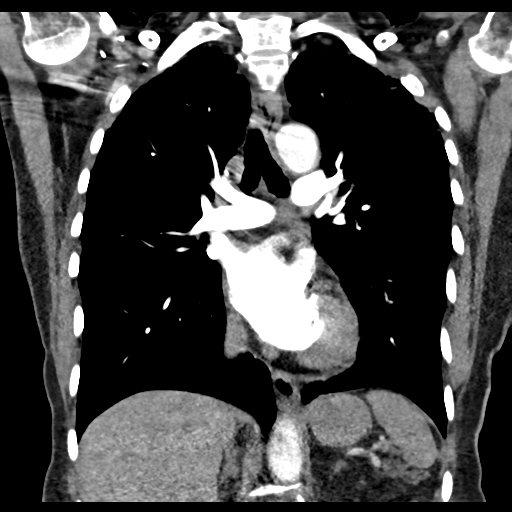

[19 of 36 positions shown; findings below may reference images not displayed]

FINDINGS: Mediastinum/Lymph Nodes: Images of the thoracic inlet are
unremarkable. Central airways are patent. No mediastinal hematoma or
adenopathy. Mild atherosclerotic calcifications of thoracic aorta.
No aortic aneurysm or dissection. The study is of excellent
technical quality. No pulmonary embolus is noted. Atherosclerotic
calcifications are noted coronary arteries.

Lungs/Pleura: Images of the lung parenchyma shows no acute
infiltrate or pulmonary edema. There is mild scarring bilateral lung
bases anteriorly. No focal consolidation. Linear scarring noted in
the right lower lobe posteriorly. Mild bilateral apical pleural
parenchymal scarring. Minimal hyperinflation.

Upper abdomen: The visualized upper abdomen shows no adrenal gland
mass. There are extensive artifacts from metallic rods lower
thoracic and lumbar spine. Nonobstructive calcified calculus in
midpole of the left kidney measures 1.4 mm. There is a right hepatic
lobe cyst measures 9 mm.

Musculoskeletal: No destructive bony lesions are noted. Mild
degenerative changes are noted mid thoracic and upper thoracic
spine. Metallic fixation rods and transpedicular screws are noted
lower thoracic and lumbar spine.

Review of the MIP images confirms the above findings.
IMPRESSION: 1. No pulmonary embolus is noted.
2. No mediastinal hematoma or adenopathy.
3. No acute infiltrate or pulmonary edema.
4. No aortic aneurysm or dissection.
5. Left nonobstructive nephrolithiasis.

## 2018-04-13 NOTE — Telephone Encounter (Signed)
New Message:     Please call, question about when she needs to have her lab work.

## 2018-04-13 NOTE — Telephone Encounter (Signed)
Pt informed she may have labwork done at office.

## 2018-04-28 ENCOUNTER — Encounter: Payer: Medicare Other | Admitting: Obstetrics & Gynecology

## 2018-04-28 DIAGNOSIS — Z6820 Body mass index (BMI) 20.0-20.9, adult: Secondary | ICD-10-CM | POA: Diagnosis not present

## 2018-04-28 DIAGNOSIS — M810 Age-related osteoporosis without current pathological fracture: Secondary | ICD-10-CM | POA: Diagnosis not present

## 2018-05-13 DIAGNOSIS — Z01812 Encounter for preprocedural laboratory examination: Secondary | ICD-10-CM | POA: Diagnosis not present

## 2018-05-14 ENCOUNTER — Ambulatory Visit (HOSPITAL_COMMUNITY)
Admission: RE | Admit: 2018-05-14 | Discharge: 2018-05-14 | Disposition: A | Discharge status: Home / Self Care | Payer: Medicare Other | Source: Ambulatory Visit | Attending: Physician Assistant | Admitting: Physician Assistant

## 2018-05-14 ENCOUNTER — Other Ambulatory Visit: Payer: Self-pay

## 2018-05-14 ENCOUNTER — Encounter (HOSPITAL_COMMUNITY): Payer: Self-pay

## 2018-05-14 ENCOUNTER — Ambulatory Visit (HOSPITAL_COMMUNITY): Payer: Medicare Other

## 2018-05-14 ENCOUNTER — Telehealth: Payer: Self-pay | Admitting: *Deleted

## 2018-05-14 DIAGNOSIS — Z01812 Encounter for preprocedural laboratory examination: Secondary | ICD-10-CM

## 2018-05-14 DIAGNOSIS — I10 Essential (primary) hypertension: Secondary | ICD-10-CM

## 2018-05-14 DIAGNOSIS — Z79899 Other long term (current) drug therapy: Secondary | ICD-10-CM

## 2018-05-14 DIAGNOSIS — N289 Disorder of kidney and ureter, unspecified: Secondary | ICD-10-CM

## 2018-05-14 DIAGNOSIS — R0609 Other forms of dyspnea: Secondary | ICD-10-CM

## 2018-05-14 DIAGNOSIS — R072 Precordial pain: Secondary | ICD-10-CM

## 2018-05-14 DIAGNOSIS — R0600 Dyspnea, unspecified: Secondary | ICD-10-CM

## 2018-05-14 LAB — BASIC METABOLIC PANEL
BUN/Creatinine Ratio: 13 (ref 12–28)
BUN: 20 mg/dL (ref 8–27)
CO2: 24 mmol/L (ref 20–29)
Calcium: 10.1 mg/dL (ref 8.7–10.3)
Chloride: 101 mmol/L (ref 96–106)
Creatinine, Ser: 1.56 mg/dL — ABNORMAL HIGH (ref 0.57–1.00)
GFR calc Af Amer: 37 mL/min/{1.73_m2} — ABNORMAL LOW (ref >59)
GFR calc non Af Amer: 32 mL/min/{1.73_m2} — ABNORMAL LOW (ref >59)
Glucose: 88 mg/dL (ref 65–99)
Potassium: 4.6 mmol/L (ref 3.5–5.2)
Sodium: 142 mmol/L (ref 134–144)

## 2018-05-14 MED ORDER — BISOPROLOL FUMARATE 5 MG PO TABS: 10.0000 mg | ORAL_TABLET | Freq: Every day | ORAL | Status: DC

## 2018-05-14 NOTE — Telephone Encounter (Signed)
Spoke to Ivin Booty F.-states patient is rescheduled for cardiac CT 11/25 at 10:30.  She request we call patient to verify labs are to be completed in 2 weeks (11/20) and review CT instructions and medication instructions for CT.       Also request order to be reentered as the current one was cancelled.  Order placed.    Spoke to patient, aware to have repeat labs in 2 weeks (11/20) aware of medication changes and verbalized understanding.  She is aware to call back with any questions or concerns.

## 2018-05-14 NOTE — Telephone Encounter (Signed)
I would increase bisoprolol to 10 mg daily  [name], [facility]

## 2018-05-14 NOTE — Telephone Encounter (Signed)
Spoke to patient, advised of Dr. Doug Sou recommendations.    She is currently taking Bisoprolol 5 mg daily and HCTZ 25 mg daily.  She is aware to stop HCTZ.    Advised would verify with Dr. Martinique no further recommendations for bisoprolol (as originally recommended increase to 5 which patient is currently taking).    Order placed for repeat in 2 weeks.  She states her blood pressure has been "ok" at home.   She states it was initially elevated at the hospital but came down to 130s/60s.    Advised to closely monitor since medication change.

## 2018-05-14 NOTE — Telephone Encounter (Signed)
Patient aware and verbalized understanding. °

## 2018-05-14 NOTE — Progress Notes (Signed)
Patient arrived CT spoke with doctor because abnormal labs. Cannot have CT today and will need to call [facility] at [phone]

## 2018-05-14 NOTE — Addendum Note (Signed)
Addended by: Patria Mane A on: 05/14/2018 04:29 PM   Modules accepted: Orders

## 2018-05-14 NOTE — Telephone Encounter (Signed)
Spoke to [employer] is at [facility] [facility] currently to have her cardiac CT.  They are concerned about her labs and need ok to do the CT.       Reviewed with Dr. Barnie Alderman CT, change bisoprolol/hctz to only bisoprolol 5 mg, stop HTCZ.  Repeat BMET in 2 weeks.  Reschedule cardiac CT.  Called CT department, aware and will have patient call to discuss medication changes.

## 2018-05-20 ENCOUNTER — Observation Stay (HOSPITAL_COMMUNITY)
Admission: EM | Admit: 2018-05-20 | Discharge: 2018-05-21 | Disposition: A | Discharge status: Home / Self Care | Payer: Medicare Other | Attending: Internal Medicine | Admitting: Internal Medicine

## 2018-05-20 ENCOUNTER — Encounter (HOSPITAL_COMMUNITY): Payer: Self-pay

## 2018-05-20 ENCOUNTER — Emergency Department (HOSPITAL_COMMUNITY): Payer: Medicare Other

## 2018-05-20 ENCOUNTER — Observation Stay (HOSPITAL_COMMUNITY): Payer: Medicare Other

## 2018-05-20 ENCOUNTER — Other Ambulatory Visit: Payer: Self-pay

## 2018-05-20 ENCOUNTER — Ambulatory Visit (HOSPITAL_COMMUNITY): Payer: Medicare Other

## 2018-05-20 DIAGNOSIS — Z79899 Other long term (current) drug therapy: Secondary | ICD-10-CM | POA: Diagnosis not present

## 2018-05-20 DIAGNOSIS — N183 Chronic kidney disease, stage 3 unspecified: Secondary | ICD-10-CM | POA: Diagnosis present

## 2018-05-20 DIAGNOSIS — F419 Anxiety disorder, unspecified: Secondary | ICD-10-CM | POA: Diagnosis not present

## 2018-05-20 DIAGNOSIS — M5030 Other cervical disc degeneration, unspecified cervical region: Secondary | ICD-10-CM | POA: Diagnosis not present

## 2018-05-20 DIAGNOSIS — I6522 Occlusion and stenosis of left carotid artery: Secondary | ICD-10-CM | POA: Insufficient documentation

## 2018-05-20 DIAGNOSIS — I509 Heart failure, unspecified: Secondary | ICD-10-CM | POA: Insufficient documentation

## 2018-05-20 DIAGNOSIS — K219 Gastro-esophageal reflux disease without esophagitis: Secondary | ICD-10-CM | POA: Diagnosis not present

## 2018-05-20 DIAGNOSIS — F329 Major depressive disorder, single episode, unspecified: Secondary | ICD-10-CM | POA: Diagnosis not present

## 2018-05-20 DIAGNOSIS — I130 Hypertensive heart and chronic kidney disease with heart failure and stage 1 through stage 4 chronic kidney disease, or unspecified chronic kidney disease: Secondary | ICD-10-CM | POA: Diagnosis not present

## 2018-05-20 DIAGNOSIS — Z7982 Long term (current) use of aspirin: Secondary | ICD-10-CM | POA: Insufficient documentation

## 2018-05-20 DIAGNOSIS — E785 Hyperlipidemia, unspecified: Secondary | ICD-10-CM | POA: Diagnosis not present

## 2018-05-20 DIAGNOSIS — R600 Localized edema: Secondary | ICD-10-CM | POA: Insufficient documentation

## 2018-05-20 DIAGNOSIS — R4701 Aphasia: Secondary | ICD-10-CM | POA: Diagnosis not present

## 2018-05-20 DIAGNOSIS — R4781 Slurred speech: Secondary | ICD-10-CM | POA: Diagnosis not present

## 2018-05-20 DIAGNOSIS — I10 Essential (primary) hypertension: Secondary | ICD-10-CM | POA: Diagnosis present

## 2018-05-20 DIAGNOSIS — D649 Anemia, unspecified: Secondary | ICD-10-CM | POA: Diagnosis not present

## 2018-05-20 DIAGNOSIS — F32A Depression, unspecified: Secondary | ICD-10-CM | POA: Diagnosis present

## 2018-05-20 DIAGNOSIS — G459 Transient cerebral ischemic attack, unspecified: Principal | ICD-10-CM | POA: Diagnosis present

## 2018-05-20 DIAGNOSIS — M6281 Muscle weakness (generalized): Secondary | ICD-10-CM | POA: Diagnosis not present

## 2018-05-20 DIAGNOSIS — I739 Peripheral vascular disease, unspecified: Secondary | ICD-10-CM

## 2018-05-20 DIAGNOSIS — R531 Weakness: Secondary | ICD-10-CM | POA: Diagnosis not present

## 2018-05-20 DIAGNOSIS — D539 Nutritional anemia, unspecified: Secondary | ICD-10-CM

## 2018-05-20 DIAGNOSIS — I779 Disorder of arteries and arterioles, unspecified: Secondary | ICD-10-CM | POA: Diagnosis present

## 2018-05-20 HISTORY — DX: Transient cerebral ischemic attack, unspecified: G45.9

## 2018-05-20 LAB — I-STAT CHEM 8, ED
BUN: 21 mg/dL (ref 8–23)
Calcium, Ion: 1.16 mmol/L (ref 1.15–1.40)
Chloride: 106 mmol/L (ref 98–111)
Creatinine, Ser: 1.2 mg/dL — ABNORMAL HIGH (ref 0.44–1.00)
Glucose, Bld: 97 mg/dL (ref 70–99)
HCT: 29 % — ABNORMAL LOW (ref 36.0–46.0)
Hemoglobin: 9.9 g/dL — ABNORMAL LOW (ref 12.0–15.0)
Potassium: 3.6 mmol/L (ref 3.5–5.1)
Sodium: 143 mmol/L (ref 135–145)
TCO2: 24 mmol/L (ref 22–32)

## 2018-05-20 LAB — COMPREHENSIVE METABOLIC PANEL
ALT: 27 U/L (ref 0–44)
AST: 36 U/L (ref 15–41)
Albumin: 3.5 g/dL (ref 3.5–5.0)
Alkaline Phosphatase: 123 U/L (ref 38–126)
Anion gap: 11 (ref 5–15)
BUN: 21 mg/dL (ref 8–23)
CO2: 23 mmol/L (ref 22–32)
Calcium: 9.5 mg/dL (ref 8.9–10.3)
Chloride: 105 mmol/L (ref 98–111)
Creatinine, Ser: 1.26 mg/dL — ABNORMAL HIGH (ref 0.44–1.00)
GFR calc Af Amer: 46 mL/min — ABNORMAL LOW (ref >60)
GFR calc non Af Amer: 40 mL/min — ABNORMAL LOW (ref >60)
Glucose, Bld: 105 mg/dL — ABNORMAL HIGH (ref 70–99)
Potassium: 3.7 mmol/L (ref 3.5–5.1)
Sodium: 139 mmol/L (ref 135–145)
Total Bilirubin: 0.7 mg/dL (ref 0.3–1.2)
Total Protein: 6.2 g/dL — ABNORMAL LOW (ref 6.5–8.1)

## 2018-05-20 LAB — CBC
HCT: 31.8 % — ABNORMAL LOW (ref 36.0–46.0)
Hemoglobin: 10.4 g/dL — ABNORMAL LOW (ref 12.0–15.0)
MCH: 32.7 pg (ref 26.0–34.0)
MCHC: 32.7 g/dL (ref 30.0–36.0)
MCV: 100 fL (ref 80.0–100.0)
Platelets: 279 10*3/uL (ref 150–400)
RBC: 3.18 MIL/uL — ABNORMAL LOW (ref 3.87–5.11)
RDW: 12.2 % (ref 11.5–15.5)
WBC: 5.4 10*3/uL (ref 4.0–10.5)
nRBC: 0 % (ref 0.0–0.2)

## 2018-05-20 LAB — URINALYSIS, ROUTINE W REFLEX MICROSCOPIC
Bilirubin Urine: NEGATIVE
Glucose, UA: NEGATIVE mg/dL
Hgb urine dipstick: NEGATIVE
Ketones, ur: NEGATIVE mg/dL
Leukocytes, UA: NEGATIVE
Nitrite: NEGATIVE
Protein, ur: NEGATIVE mg/dL
Specific Gravity, Urine: 1.006 (ref 1.005–1.030)
pH: 7 (ref 5.0–8.0)

## 2018-05-20 LAB — I-STAT TROPONIN, ED: Troponin i, poc: 0.01 ng/mL (ref 0.00–0.08)

## 2018-05-20 LAB — DIFFERENTIAL
Abs Immature Granulocytes: 0.01 10*3/uL (ref 0.00–0.07)
Basophils Absolute: 0 10*3/uL (ref 0.0–0.1)
Basophils Relative: 1 %
Eosinophils Absolute: 0 10*3/uL (ref 0.0–0.5)
Eosinophils Relative: 1 %
Immature Granulocytes: 0 %
Lymphocytes Relative: 16 %
Lymphs Abs: 0.9 10*3/uL (ref 0.7–4.0)
Monocytes Absolute: 0.5 10*3/uL (ref 0.1–1.0)
Monocytes Relative: 10 %
Neutro Abs: 3.9 10*3/uL (ref 1.7–7.7)
Neutrophils Relative %: 72 %

## 2018-05-20 LAB — PROTIME-INR
INR: 0.96
Prothrombin Time: 12.7 seconds (ref 11.4–15.2)

## 2018-05-20 LAB — APTT: aPTT: 25 seconds (ref 24–36)

## 2018-05-20 LAB — RAPID URINE DRUG SCREEN, HOSP PERFORMED
Amphetamines: NOT DETECTED
Barbiturates: NOT DETECTED
Benzodiazepines: POSITIVE — AB
Cocaine: NOT DETECTED
Opiates: NOT DETECTED
Tetrahydrocannabinol: NOT DETECTED

## 2018-05-20 LAB — ETHANOL: Alcohol, Ethyl (B): <10 mg/dL (ref <10)

## 2018-05-20 LAB — BRAIN NATRIURETIC PEPTIDE: B Natriuretic Peptide: 532.1 pg/mL — ABNORMAL HIGH (ref 0.0–100.0)

## 2018-05-20 MED ORDER — ATORVASTATIN CALCIUM 40 MG PO TABS
40.0000 mg | ORAL_TABLET | Freq: Every day | ORAL | Status: DC
  Administered 2018-05-20 – 2018-05-21 (×2): 40 mg via ORAL
  Filled 2018-05-20 (×2): qty 1

## 2018-05-20 MED ORDER — PANTOPRAZOLE SODIUM 40 MG PO TBEC
40.0000 mg | DELAYED_RELEASE_TABLET | Freq: Every day | ORAL | Status: DC
  Administered 2018-05-20 – 2018-05-21 (×2): 40 mg via ORAL
  Filled 2018-05-20 (×2): qty 1

## 2018-05-20 MED ORDER — ONDANSETRON HCL 4 MG/2ML IJ SOLN: 4.0000 mg | Freq: Three times a day (TID) | INTRAMUSCULAR | Status: DC | PRN

## 2018-05-20 MED ORDER — ALPRAZOLAM 0.25 MG PO TABS: 0.1250 mg | ORAL_TABLET | Freq: Every evening | ORAL | Status: DC | PRN

## 2018-05-20 MED ORDER — VITAMIN C 500 MG PO TABS
1000.0000 mg | ORAL_TABLET | Freq: Two times a day (BID) | ORAL | Status: DC
  Administered 2018-05-21: 1000 mg via ORAL
  Filled 2018-05-20: qty 2

## 2018-05-20 MED ORDER — IOPAMIDOL (ISOVUE-370) INJECTION 76%
INTRAVENOUS | Status: AC
  Filled 2018-05-20: qty 100

## 2018-05-20 MED ORDER — OMEGA-3-ACID ETHYL ESTERS 1 G PO CAPS
1.0000 g | ORAL_CAPSULE | Freq: Every day | ORAL | Status: DC
  Administered 2018-05-21: 1 g via ORAL
  Filled 2018-05-20: qty 1

## 2018-05-20 MED ORDER — SENNOSIDES-DOCUSATE SODIUM 8.6-50 MG PO TABS: 1.0000 | ORAL_TABLET | Freq: Every evening | ORAL | Status: DC | PRN

## 2018-05-20 MED ORDER — VITAMIN D 25 MCG (1000 UNIT) PO TABS
2000.0000 [IU] | ORAL_TABLET | Freq: Every day | ORAL | Status: DC
  Administered 2018-05-21: 2000 [IU] via ORAL
  Filled 2018-05-20: qty 2

## 2018-05-20 MED ORDER — BUPROPION HCL ER (XL) 150 MG PO TB24
300.0000 mg | ORAL_TABLET | Freq: Every day | ORAL | Status: DC
  Administered 2018-05-20 – 2018-05-21 (×2): 300 mg via ORAL
  Filled 2018-05-20 (×2): qty 2

## 2018-05-20 MED ORDER — SENNA 8.6 MG PO TABS: 1.0000 | ORAL_TABLET | Freq: Every evening | ORAL | Status: DC | PRN

## 2018-05-20 MED ORDER — EZETIMIBE 10 MG PO TABS
10.0000 mg | ORAL_TABLET | Freq: Every day | ORAL | Status: DC
  Administered 2018-05-20: 10 mg via ORAL
  Filled 2018-05-20: qty 1

## 2018-05-20 MED ORDER — IOPAMIDOL (ISOVUE-370) INJECTION 76%
60.0000 mL | Freq: Once | INTRAVENOUS | Status: AC | PRN
  Administered 2018-05-21: 60 mL via INTRAVENOUS

## 2018-05-20 MED ORDER — HYDRALAZINE HCL 20 MG/ML IJ SOLN: 5.0000 mg | INTRAMUSCULAR | Status: DC | PRN

## 2018-05-20 MED ORDER — LORAZEPAM 2 MG/ML IJ SOLN
0.5000 mg | Freq: Once | INTRAMUSCULAR | Status: AC
  Administered 2018-05-20: 0.5 mg via INTRAVENOUS
  Filled 2018-05-20: qty 1

## 2018-05-20 MED ORDER — SODIUM CHLORIDE 0.9 % IV SOLN
INTRAVENOUS | Status: DC
  Administered 2018-05-21: 16:00:00 via INTRAVENOUS

## 2018-05-20 MED ORDER — VENLAFAXINE HCL 75 MG PO TABS
75.0000 mg | ORAL_TABLET | Freq: Every day | ORAL | Status: DC
  Administered 2018-05-21: 75 mg via ORAL
  Filled 2018-05-20: qty 1
  Filled 2018-05-20: qty 2

## 2018-05-20 MED ORDER — CALCIUM 600 MG PO TABS: 1200.0000 mg | ORAL_TABLET | Freq: Every day | ORAL | Status: DC

## 2018-05-20 MED ORDER — ADULT MULTIVITAMIN W/MINERALS CH
1.0000 | ORAL_TABLET | Freq: Every day | ORAL | Status: DC
  Administered 2018-05-21: 1 via ORAL
  Filled 2018-05-20: qty 1

## 2018-05-20 MED ORDER — STROKE: EARLY STAGES OF RECOVERY BOOK
Freq: Once | Status: AC
  Administered 2018-05-20
  Filled 2018-05-20 (×2): qty 1

## 2018-05-20 MED ORDER — ENOXAPARIN SODIUM 30 MG/0.3ML ~~LOC~~ SOLN
30.0000 mg | SUBCUTANEOUS | Status: DC
  Administered 2018-05-20: 30 mg via SUBCUTANEOUS
  Filled 2018-05-20: qty 0.3

## 2018-05-20 MED ORDER — ZOLPIDEM TARTRATE 5 MG PO TABS: 5.0000 mg | ORAL_TABLET | Freq: Every evening | ORAL | Status: DC | PRN

## 2018-05-20 NOTE — ED Triage Notes (Signed)
Pt BIB [facility] for eval of episodic L sided weakness and aphasia around 1615 this afternoon lasting 15-20 minutes. Resolved by EMS arrival. PT arrives GCS 15 A&Ox4. Pt reports she is at baseline at this time. Pt endorses prior L sided weakness d/t hx of back surgery.

## 2018-05-20 NOTE — H&P (Addendum)
History and Physical    Shannon Berger XLK:[id number] DOB: [birth year] DOA: 05/20/2018  Referring MD/NP/PA:   PCP: Shannon Infante, MD   Patient coming from:  The patient is coming from home.  At baseline, pt is independent for most of ADL.        Chief Complaint: Aphasia, left leg weakness  HPI: Shannon Berger is a 77 y.o. female with medical history significant of hypertension, hyperlipidemia, chronic back pain due to lumbar spine degenerative disc disease, GERD, depression with anxiety, carotid artery disease, CKD-3, who presents with aphasia, left leg weakness.  Per patient's husband, patient had one episode of difficulty speaking, started at home about 16:15, lasted for about 20 minutes then resolved spontaneously.  Patient has a chronic left leg weakness due to lumbar surgery, which has worsened today.  No weakness, numbness in arms. Per husband, pt had agitation and mild confusion which has resolved. 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  No chest pain, shortness breath, cough.  No fever or chills.  No symptoms for UTI.  No facial droop.   ED Course: pt was found to have WBC 5.4, INR 0.96, negative urinalysis, alcohol level less than 10, slightly worsening renal function, blood pressure 216/85, 208/83, no tachycardia, mild tachypnea, oxygen saturation 100% on room air, temperature normal. Ct-head showed mild-to-moderate small vessel ischemic disease of periventricular white matter, but no acute intracranial abnormality. Pt is placed on telemetry bed for observation.  Neurology, Dr. Leonel Ramsay was consulted.  Review of Systems:   General: no fevers, chills, no body weight gain, has fatigue HEENT: no blurry vision, hearing changes or sore throat Respiratory: no dyspnea, coughing, wheezing CV: no chest pain, no palpitations GI: no nausea, vomiting, abdominal pain, diarrhea, constipation GU: no  dysuria, burning on urination, increased urinary frequency, hematuria  Ext: no leg edema Neuro: has difficulty speaking, and worsening left leg weakness.  Has chronic right eye vision loss and chronic poor hearing. Skin: no rash, no skin tear. MSK: No muscle spasm, no deformity, no limitation of range of movement in spin Heme: No easy bruising.  Travel history: No recent long distant travel.  Allergy:  Allergies  Allergen Reactions  . Oxycodone-Acetaminophen Anaphylaxis  . Celebrex [Celecoxib]     Pt does not remember if this is truly an allergy. Hallucinations  . Hydrocodone   . Hydrocodone-Acetaminophen Nausea And Vomiting  . Oxycodone     Past Medical History:  Diagnosis Date  . Arthritis   . Carotid arterial disease ([place])   . Constipation   . Degeneration of lumbar intervertebral disc   . Dislocation of hip joint prosthesis ([place])   . Fusion of spine of lumbar region   . Hyperlipidemia   . Hypertension   . Low back pain    Radiating to left leg  . Osteoarthritis   . Osteopenia   . Sleep difficulties   . Spinal stenosis at L4-L5 level     Past Surgical History:  Procedure Laterality Date  . BACK SURGERY    . BUNIONECTOMY    . EYE SURGERY    . FOOT SURGERY     Status post right foot surgery  . HAND FUSION     Right thumb  . JOINT REPLACEMENT Left 2014   Hip  . LUMBAR FUSION     Anterior and posterior  . OTHER SURGICAL HISTORY     post fusion of her right thumb  .  PILONIDAL CYST / SINUS EXCISION    . TENDON REPAIR     left hip  . TENDON REPAIR     Reattachment of tendon- Left hip  . TONSILLECTOMY AND ADENOIDECTOMY    . TOTAL HIP ARTHROPLASTY Left 09/04/2012   Procedure: LEFT TOTAL HIP ARTHROPLASTY ANTERIOR APPROACH;  Surgeon: Mcarthur Rossetti, MD;  Location: [facility];  Service: Orthopedics;  Laterality: Left;  Left Total Hip Arthroplasty, Anterior Approach    Social History:  reports that she has never smoked. She has never used smokeless tobacco. She  reports that she drinks alcohol. She reports that she does not use drugs.  Family History:  Family History  Problem Relation Age of Onset  . Heart failure Mother   . Cancer Mother        colon  . Hypertension Mother   . Other Mother        Varicose Veins  . Diabetes Father   . Heart disease Father   . Hypertension Father   . Heart attack Father   . Cancer Brother        squamos cell - head, neck, ear     Prior to Admission medications   Medication Sig Start Date End Date Taking? Authorizing Provider  acetaminophen (TYLENOL) 500 MG tablet Take 325 mg by mouth.     [provider]  ALPRAZolam Duanne Moron) 0.25 MG tablet Take 0.125 mg by mouth at bedtime as needed. Take 1/2 tablet po PRN QHS insomnia or anxiety    [provider]  Ascorbic Acid (VITAMIN C PO) Take 1,000 mg by mouth at bedtime.     [provider]  aspirin EC 81 MG tablet Take 81 mg by mouth at bedtime.     [provider]  atorvastatin (LIPITOR) 40 MG tablet Take 40 mg by mouth daily. 01/29/15   [provider]  bisoprolol (ZEBETA) 5 MG tablet Take 2 tablets (10 mg total) by mouth daily. 05/14/18   [place], [name], MD  buPROPion (WELLBUTRIN XL) 300 MG 24 hr tablet Take 300 mg by mouth daily.    [provider]  Calcium Carb-Cholecalciferol 500-600 MG-UNIT TABS Take by mouth.    [provider]  calcium carbonate (OS-CAL) 600 MG tablet Take 600 mg by mouth daily.    [provider]  Cholecalciferol (VITAMIN D) 2000 units tablet Take 2,000 Units by mouth as directed.    [provider]  ezetimibe (ZETIA) 10 MG tablet Take 10 mg by mouth daily.    [provider]  ibandronate (BONIVA) 150 MG tablet TK 1 T PO ONCE A MONTH AS DIRECTED 11/13/16   [provider]  losartan (COZAAR) 50 MG tablet Take 50 mg by mouth daily.    [provider]  meloxicam (MOBIC) 15 MG tablet Take 15 mg by mouth as needed.     [provider]  Multiple Vitamin (MULTIVITAMIN) tablet Take 1 tablet by mouth daily.      [provider]  Omega-3 Fatty Acids (FISH OIL PO) Take 1,400 mg by mouth 2 (two) times daily.     [provider]  omeprazole (PRILOSEC) 20 MG capsule Take 20 mg by mouth daily.    [provider]  senna (SENOKOT) 8.6 MG TABS tablet Take 1 tablet by mouth at bedtime as needed for mild constipation.    [provider]  venlafaxine (EFFEXOR) 25 MG tablet Take 75 mg by mouth daily.     [provider]  Physical Exam: Vitals:   05/20/18 1815 05/20/18 1818 05/20/18 1825 05/20/18 1830  BP:   (!) 216/85 (!) 208/83  Pulse:   72 74  Resp:   16 (!) 21  Temp:   98.3 F (36.8 C)   TempSrc:   Oral   SpO2: 100%  100% 100%  Weight:  51.7 kg    Height:  5\' 1"  (1.549 m)     General: Not in acute distress HEENT:       Eyes: has poor vision in right eye. Left eye with PERRL, EOMI, no scleral icterus.       ENT: No discharge from the ears and nose, no pharynx injection, no tonsillar enlargement.        Neck: No JVD, no bruit, no mass felt. Heme: No neck lymph node enlargement. Cardiac: S1/S2, RRR, No murmurs, No gallops or rubs. Respiratory: No rales, wheezing, rhonchi or rubs. GI: Soft, nondistended, nontender, no rebound pain, no organomegaly, BS present. GU: No hematuria Ext: No pitting leg edema bilaterally. 2+DP/PT pulse bilaterally. Musculoskeletal: No joint deformities, No joint redness or warmth, no limitation of ROM in spin. Skin: No rashes.  Neuro: Alert, oriented X3, cranial nerves II-XII grossly intact. Has mild left leg weakness.  Psych: Patient is not psychotic, no suicidal or hemocidal ideation.  Labs on Admission: I have personally reviewed following labs and imaging studies  CBC: Recent Labs  Lab 05/20/18 1833 05/20/18 1849  WBC 5.4  --   NEUTROABS 3.9  --   HGB 10.4* 9.9*  HCT 31.8* 29.0*  MCV 100.0  --   PLT 279  --    Basic Metabolic  Panel: Recent Labs  Lab 05/20/18 1833 05/20/18 1849  NA 139 143  K 3.7 3.6  CL 105 106  CO2 23  --   GLUCOSE 105* 97  BUN 21 21  CREATININE 1.26* 1.20*  CALCIUM 9.5  --    GFR: Estimated Creatinine Clearance: 29.6 mL/min (A) (by C-G formula based on SCr of 1.2 mg/dL (H)). Liver Function Tests: Recent Labs  Lab 05/20/18 1833  AST 36  ALT 27  ALKPHOS 123  BILITOT 0.7  PROT 6.2*  ALBUMIN 3.5   No results for input(s): LIPASE, AMYLASE in the last 168 hours. No results for input(s): AMMONIA in the last 168 hours. Coagulation Profile: Recent Labs  Lab 05/20/18 1833  INR 0.96   Cardiac Enzymes: No results for input(s): CKTOTAL, CKMB, CKMBINDEX, TROPONINI in the last 168 hours. BNP (last 3 results) No results for input(s): PROBNP in the last 8760 hours. HbA1C: No results for input(s): HGBA1C in the last 72 hours. CBG: No results for input(s): GLUCAP in the last 168 hours. Lipid Profile: No results for input(s): CHOL, HDL, LDLCALC, TRIG, CHOLHDL, LDLDIRECT in the last 72 hours. Thyroid Function Tests: No results for input(s): TSH, T4TOTAL, FREET4, T3FREE, THYROIDAB in the last 72 hours. Anemia Panel: No results for input(s): VITAMINB12, FOLATE, FERRITIN, TIBC, IRON, RETICCTPCT in the last 72 hours. Urine analysis:    Component Value Date/Time   COLORURINE YELLOW 05/20/2018 1831   APPEARANCEUR CLEAR 05/20/2018 1831   LABSPEC 1.006 05/20/2018 1831   PHURINE 7.0 05/20/2018 1831   GLUCOSEU NEGATIVE 05/20/2018 1831   HGBUR NEGATIVE 05/20/2018 1831   BILIRUBINUR NEGATIVE 05/20/2018 1831   KETONESUR NEGATIVE 05/20/2018 1831   PROTEINUR NEGATIVE 05/20/2018 1831   UROBILINOGEN 0.2 08/21/2012 1426   NITRITE NEGATIVE 05/20/2018 1831   LEUKOCYTESUR NEGATIVE 05/20/2018 1831   Sepsis Labs: @LABRCNTIP (procalcitonin:4,lacticidven:4) )No results  found for this or any previous visit (from the past 240 hour(s)).   Radiological Exams on Admission: Ct Head Wo  Contrast  Result Date: 05/20/2018 CLINICAL DATA:  Episodic left-sided weakness and aphasia this afternoon lasting 15-20 minutes now resolved. EXAM: CT HEAD WITHOUT CONTRAST TECHNIQUE: Contiguous axial images were obtained from the base of the skull through the vertex without intravenous contrast. COMPARISON:  None. FINDINGS: Brain: Mild-to-moderate small vessel ischemic disease of periventricular white matter with age related involutional changes of the brain. No intra-axial mass, extra-axial fluid collection, midline shift or edema. No hydrocephalus. Midline fourth ventricle and basal cisterns without effacement. Vascular: Atherosclerosis of the carotid siphons. No hyperdense vessels. Skull: Intact Sinuses/Orbits: Bilateral lens replacements. Clear paranasal sinuses and mastoids. Osteoarthritis of the temporomandibular joints with subchondral cystic change of the condylar heads of the mandible. Other: None IMPRESSION: 1. Mild-to-moderate small vessel ischemic disease of periventricular white matter. No acute intracranial abnormality. 2. Bilateral TMJ osteoarthritis. Electronically Signed   By: Ashley Royalty M.D.   On: 05/20/2018 20:04   Mr Brain Wo Contrast  Result Date: 05/20/2018 CLINICAL DATA:  Left-sided weakness and aphasia EXAM: MRI HEAD WITHOUT CONTRAST TECHNIQUE: Multiplanar, multiecho pulse sequences of the brain and surrounding structures were obtained without intravenous contrast. COMPARISON:  None. FINDINGS: BRAIN: There is no acute infarct, acute hemorrhage, hydrocephalus or extra-axial collection. The midline structures are normal. No midline shift or other mass effect. There are no old infarcts. Multifocal white matter hyperintensity, most commonly due to chronic ischemic microangiopathy. The cerebral and cerebellar volume are age-appropriate. Susceptibility-sensitive sequences show no chronic microhemorrhage or superficial siderosis. VASCULAR: Major intracranial arterial and venous sinus flow  voids are normal. SKULL AND UPPER CERVICAL SPINE: Calvarial bone marrow signal is normal. There is no skull base mass. Visualized upper cervical spine and soft tissues are normal. SINUSES/ORBITS: No fluid levels or advanced mucosal thickening. No mastoid or middle ear effusion. The orbits are normal. IMPRESSION: 1. No acute abnormality. 2. Chronic small vessel ischemia. Electronically Signed   By: Ulyses Jarred M.D.   On: 05/20/2018 21:20     EKG: Independently reviewed.  Sinus rhythm, QTC 432, anteroseptal infarction pattern, no ischemic change.  Assessment/Plan Principal Problem:   TIA (transient ischemic attack) Active Problems:   Hypertension   Carotid arterial disease (HCC)   Hyperlipidemia   Anxiety   Depression   GERD (gastroesophageal reflux disease)   CKD (chronic kidney disease), stage III (HCC)   Macrocytic anemia   Possible TIA (transient ischemic attack): Patient had transient difficulty speaking and worsening left leg edema, with negative CT head, concerning for TIA.  Urology, Dr. Leonel Ramsay was consulted.  - will place on tele bed for obs - will follow up Neurology's Recs.  - f/u MRI which is ordered by EDP - ASA  - fasting lipid panel and HbA1c  - 2D transthoracic echocardiography  - swallowing screen. If fails, will get SLP - Check UDS  - PT/OT consult - CTA of head and neck, and MRI-C spin per Dr. Leonel Ramsay  HTN:  -hold home bp meds to allow permissive hypertension -IV hydralazine prn for SBP>220 and dBP> 120  Carotid arterial disease ([place]): -On aspirin, Lipitor, Zetia  Hyperlipidemia: -lipitor and Zetia  Depression and anxiety: no suicidal or homicidal ideations. -Continue home medications: PRN Xanax and Wellbutrin  GERD: -Protonix  CKD (chronic kidney disease), stage III ([place]): slightly worse. Baseline creatinine 0.8 on 07/12/2016. She had cre1.56 on 05/13/2018 which was likely acute change.  Likely due to  dehydration and continuation of Mobic and  Cozaar -IVF: NS 75 cc/h--> since pt has hx of dCHF as shown by 2D echo on 04/13/18 (EF 60-65% with grade 2 diastolic dysfunction on 2d echo), will check BNP to make sure it is OK to give some IVF for worsening renal function.  -hold Cozaar and Mobic   Macrocytic anemia: slight drop. HGb 9.9<--10.4 on 05/20/2018.  MCV 100.0. -Follow-up with CBC    DVT ppx: SQ Lovenox Code Status: Partial code (I discussed with the patient in the presence of her son and husband who is POA and explained the meaning of CODE STATUS, patient wants to be partial code, OK for CPR, but no intubation). Family Communication:  Yes, patient's son, husband and daughter-in law at bed side Disposition Plan:  Anticipate discharge back to previous home environment Consults called: Neurology, Dr. Leonel Ramsay Admission status: Obs / tele    Date of Service 05/20/2018    Ivor Costa [facility] Pager [phone]  If 7PM-7AM, please contact night-coverage [email or web address] Password [email or web address] 05/20/2018, 10:12 PM

## 2018-05-20 NOTE — ED Provider Notes (Signed)
[facility]ER[facility] DEPARTMENT Provider Note   CSN: [id number] Arrival date & time: 05/20/18  1814     History   Chief Complaint Chief Complaint  Patient presents with  . Extremity Weakness    HPI Shannon Berger is a 77 y.o. female.  77 yo F with a cc of difficulty with speech and left-sided weakness.  This lasted for about 15 to 30 minutes.  Happened right about 430.  Patient was on the phone talking to her friend and she felt like she could not get the words she needed.  She said something came out that was not the word.  She is not sure exactly if it was garbled or a different word.  She also stopped midsentence and could not remember what she was talking about.  She felt that her left upper and left lower extremity were weak but worse lower than upper.  This has now resolved and she is back to normal.  She has been having a slow worsening of her gait.  She has a tremor at baseline.  She denies head injury or headache.  The history is provided by the patient.  Extremity Weakness  This is a new problem. The current episode started yesterday. The problem occurs constantly. The problem has not changed since onset.Pertinent negatives include no chest pain, no headaches and no shortness of breath. Nothing aggravates the symptoms. Nothing relieves the symptoms. She has tried nothing for the symptoms. The treatment provided no relief.    Past Medical History:  Diagnosis Date  . Arthritis   . Carotid arterial disease ([place])   . Constipation   . Degeneration of lumbar intervertebral disc   . Dislocation of hip joint prosthesis ([place])   . Fusion of spine of lumbar region   . Hyperlipidemia   . Hypertension   . Low back pain    Radiating to left leg  . Osteoarthritis   . Osteopenia   . Sleep difficulties   . Spinal stenosis at L4-L5 level     Patient Active Problem List   Diagnosis Date Noted  . GERD (gastroesophageal reflux disease) 05/20/2018  . CKD  (chronic kidney disease), stage III ([place]) 05/20/2018  . Macrocytic anemia 05/20/2018  . TIA (transient ischemic attack) 05/20/2018  . Ischemic optic neuropathy of right eye 02/06/2017  . Epiretinal membrane (ERM) of right eye 11/14/2016  . Pseudophakia of both eyes 11/14/2016  . Status post hardware removal 07/11/2016  . Spinal stenosis of lumbar region S/P lumbar fusion 11/22/2015  . Anxiety 11/22/2015  . Constipation 11/22/2015  . Depression 11/22/2015  . Fusion of spine of lumbosacral region 05/02/2015  . Leg pain, bilateral 03/22/2015  . Leg weakness, bilateral 03/22/2015  . H/O total hip arthroplasty 11/22/2014  . Lumbar canal stenosis 09/28/2014  . Pain of left sacroiliac joint 08/15/2014  . Degeneration of intervertebral disc of lumbar region 08/01/2014  . LBP (low back pain) 08/01/2014  . Dislocation of hip joint prosthesis ([place]) 12/11/2012  . Degenerative arthritis of hip 09/04/2012  . Occlusion and stenosis of carotid artery without mention of cerebral infarction 12/25/2011  . Chest pain 04/15/2011  . PVC (premature ventricular contraction) 04/15/2011  . Hypertension   . Carotid arterial disease ([place])   . Hyperlipidemia   . Pilonidal cyst 02/05/2011    Past Surgical History:  Procedure Laterality Date  . BACK SURGERY    . BUNIONECTOMY    . EYE SURGERY    . FOOT SURGERY  Status post right foot surgery  . HAND FUSION     Right thumb  . JOINT REPLACEMENT Left 2014   Hip  . LUMBAR FUSION     Anterior and posterior  . OTHER SURGICAL HISTORY     post fusion of her right thumb  . PILONIDAL CYST / SINUS EXCISION    . TENDON REPAIR     left hip  . TENDON REPAIR     Reattachment of tendon- Left hip  . TONSILLECTOMY AND ADENOIDECTOMY    . TOTAL HIP ARTHROPLASTY Left 09/04/2012   Procedure: LEFT TOTAL HIP ARTHROPLASTY ANTERIOR APPROACH;  Surgeon: Mcarthur Rossetti, MD;  Location: [facility];  Service: Orthopedics;  Laterality: Left;  Left Total Hip Arthroplasty,  Anterior Approach     OB History    Gravida  2   Para  2   Term      Preterm      AB      Living  2     SAB      TAB      Ectopic      Multiple      Live Births               Home Medications    Prior to Admission medications   Medication Sig Start Date End Date Taking? Authorizing Provider  ALPRAZolam (XANAX) 0.25 MG tablet Take 0.125-0.25 mg by mouth at bedtime as needed for sleep.    Yes [provider]  Ascorbic Acid (VITAMIN C PO) Take 1,000 mg by mouth 2 (two) times daily.    Yes [provider]  aspirin EC 81 MG tablet Take 81 mg by mouth at bedtime.    Yes [provider]  atorvastatin (LIPITOR) 40 MG tablet Take 40 mg by mouth daily. 01/29/15  Yes [provider]  bisoprolol (ZEBETA) 5 MG tablet Take 2 tablets (10 mg total) by mouth daily. 05/14/18  Yes [place], [name], MD  buPROPion (WELLBUTRIN XL) 300 MG 24 hr tablet Take 300 mg by mouth daily.   Yes [provider]  CALCIUM PO Take 1,200 mg by mouth daily.    Yes [provider]  Cholecalciferol (VITAMIN D) 2000 units tablet Take 2,000 Units by mouth as directed.   Yes [provider]  ezetimibe (ZETIA) 10 MG tablet Take 10 mg by mouth daily.   Yes [provider]  ibandronate (BONIVA) 150 MG tablet Take 150 mg by mouth every 30 (thirty) days.  11/13/16  Yes [provider]  losartan (COZAAR) 50 MG tablet Take 50 mg by mouth daily.   Yes [provider]  meloxicam (MOBIC) 15 MG tablet Take 15 mg by mouth as needed for pain.    Yes [provider]  Multiple Vitamin (MULTIVITAMIN) tablet Take 1 tablet by mouth daily.     Yes [provider]  Omega-3 Fatty Acids (FISH OIL PO) Take 1,400 mg by mouth 2 (two) times daily.    Yes [provider]  omeprazole (PRILOSEC) 20 MG capsule Take 20 mg by mouth daily.   Yes [provider]  senna (SENOKOT) 8.6 MG TABS tablet Take 1 tablet by mouth at  bedtime as needed for mild constipation.   Yes [provider]  venlafaxine (EFFEXOR) 25 MG tablet Take 75 mg by mouth daily.    Yes [provider]    Family History Family History  Problem Relation Age of Onset  . Heart failure Mother   .  Cancer Mother        colon  . Hypertension Mother   . Other Mother        Varicose Veins  . Diabetes Father   . Heart disease Father   . Hypertension Father   . Heart attack Father   . Cancer Brother        squamos cell - head, neck, ear    Social History Social History   Tobacco Use  . Smoking status: Never Smoker  . Smokeless tobacco: Never Used  Substance Use Topics  . Alcohol use: Yes    Alcohol/week: 0.0 standard drinks    Comment: 1 gl wine biweekly  . Drug use: No     Allergies   Oxycodone-acetaminophen; Celebrex [celecoxib]; Hydrocodone; Hydrocodone-acetaminophen; and Oxycodone   Review of Systems Review of Systems  Constitutional: Negative for chills and fever.  HENT: Negative for congestion and rhinorrhea.   Eyes: Negative for redness and visual disturbance.  Respiratory: Negative for shortness of breath and wheezing.   Cardiovascular: Negative for chest pain and palpitations.  Gastrointestinal: Negative for nausea and vomiting.  Genitourinary: Negative for dysuria and urgency.  Musculoskeletal: Positive for extremity weakness. Negative for arthralgias and myalgias.  Skin: Negative for pallor and wound.  Neurological: Positive for speech difficulty and weakness. Negative for dizziness and headaches.     Physical Exam Updated Vital Signs BP (!) 181/68 (BP Location: Right Arm)   Pulse 67   Temp 98.2 F (36.8 C) (Oral)   Resp 18   Ht 5' 0.5" (1.537 m)   Wt 54.7 kg   SpO2 100%   BMI 23.16 kg/m   Physical Exam  Constitutional: She is oriented to person, place, and time. She appears well-developed and well-nourished. No distress.  HENT:  Head: Normocephalic and atraumatic.  Eyes: Pupils  are equal, round, and reactive to light. EOM are normal.  Neck: Normal range of motion. Neck supple.  Cardiovascular: Normal rate and regular rhythm. Exam reveals no gallop and no friction rub.  No murmur heard. Pulmonary/Chest: Effort normal. She has no wheezes. She has no rales.  Abdominal: Soft. She exhibits no distension. There is no tenderness.  Musculoskeletal: She exhibits no edema or tenderness.  Neurological: She is alert and oriented to person, place, and time. She has normal strength. No cranial nerve deficit or sensory deficit. Coordination (tremor) abnormal.  Slight left sided facial droop, normal per husband.   Intention tremor limits ability to perform finger to nose or heel to shin, at baseline per family.   Skin: Skin is warm and dry. She is not diaphoretic.  Psychiatric: She has a normal mood and affect. Her behavior is normal.  Nursing note and vitals reviewed.    ED Treatments / Results  Labs (all labs ordered are listed, but only abnormal results are displayed) Labs Reviewed  CBC - Abnormal; Notable for the following components:      Result Value   RBC 3.18 (*)    Hemoglobin 10.4 (*)    HCT 31.8 (*)    All other components within normal limits  COMPREHENSIVE METABOLIC PANEL - Abnormal; Notable for the following components:   Glucose, Bld 105 (*)    Creatinine, Ser 1.26 (*)    Total Protein 6.2 (*)    GFR calc non Af Amer 40 (*)    GFR calc Af Amer 46 (*)    All other components within normal limits  RAPID URINE DRUG SCREEN, HOSP PERFORMED - Abnormal; Notable for the following  components:   Benzodiazepines POSITIVE (*)    All other components within normal limits  I-STAT CHEM 8, ED - Abnormal; Notable for the following components:   Creatinine, Ser 1.20 (*)    Hemoglobin 9.9 (*)    HCT 29.0 (*)    All other components within normal limits  ETHANOL  PROTIME-INR  APTT  DIFFERENTIAL  URINALYSIS, ROUTINE W REFLEX MICROSCOPIC  HEMOGLOBIN A1C  LIPID PANEL   BRAIN NATRIURETIC PEPTIDE  I-STAT TROPONIN, ED    EKG EKG Interpretation  Date/Time:  Wednesday May 20 2018 18:25:38 EST Ventricular Rate:  74 PR Interval:    QRS Duration: 86 QT Interval:  389 QTC Calculation: 432 R Axis:   63 Text Interpretation:  Normal sinus rhythm Rate slower Otherwise no significant change Confirmed by Deno Etienne [phone]) on 05/20/2018 6:47:25 PM   Radiology Ct Head Wo Contrast  Result Date: 05/20/2018 CLINICAL DATA:  Episodic left-sided weakness and aphasia this afternoon lasting 15-20 minutes now resolved. EXAM: CT HEAD WITHOUT CONTRAST TECHNIQUE: Contiguous axial images were obtained from the base of the skull through the vertex without intravenous contrast. COMPARISON:  None. FINDINGS: Brain: Mild-to-moderate small vessel ischemic disease of periventricular white matter with age related involutional changes of the brain. No intra-axial mass, extra-axial fluid collection, midline shift or edema. No hydrocephalus. Midline fourth ventricle and basal cisterns without effacement. Vascular: Atherosclerosis of the carotid siphons. No hyperdense vessels. Skull: Intact Sinuses/Orbits: Bilateral lens replacements. Clear paranasal sinuses and mastoids. Osteoarthritis of the temporomandibular joints with subchondral cystic change of the condylar heads of the mandible. Other: None IMPRESSION: 1. Mild-to-moderate small vessel ischemic disease of periventricular white matter. No acute intracranial abnormality. 2. Bilateral TMJ osteoarthritis. Electronically Signed   By: Ashley Royalty M.D.   On: 05/20/2018 20:04   Mr Brain Wo Contrast  Result Date: 05/20/2018 CLINICAL DATA:  Left-sided weakness and aphasia EXAM: MRI HEAD WITHOUT CONTRAST TECHNIQUE: Multiplanar, multiecho pulse sequences of the brain and surrounding structures were obtained without intravenous contrast. COMPARISON:  None. FINDINGS: BRAIN: There is no acute infarct, acute hemorrhage, hydrocephalus or  extra-axial collection. The midline structures are normal. No midline shift or other mass effect. There are no old infarcts. Multifocal white matter hyperintensity, most commonly due to chronic ischemic microangiopathy. The cerebral and cerebellar volume are age-appropriate. Susceptibility-sensitive sequences show no chronic microhemorrhage or superficial siderosis. VASCULAR: Major intracranial arterial and venous sinus flow voids are normal. SKULL AND UPPER CERVICAL SPINE: Calvarial bone marrow signal is normal. There is no skull base mass. Visualized upper cervical spine and soft tissues are normal. SINUSES/ORBITS: No fluid levels or advanced mucosal thickening. No mastoid or middle ear effusion. The orbits are normal. IMPRESSION: 1. No acute abnormality. 2. Chronic small vessel ischemia. Electronically Signed   By: Ulyses Jarred M.D.   On: 05/20/2018 21:20    Procedures Procedures (including critical care time)  Medications Ordered in ED Medications   stroke: mapping our early stages of recovery book (has no administration in time range)  0.9 %  sodium chloride infusion (has no administration in time range)  senna-docusate (Senokot-S) tablet 1 tablet (has no administration in time range)  enoxaparin (LOVENOX) injection 30 mg (has no administration in time range)  ondansetron (ZOFRAN) injection 4 mg (has no administration in time range)  hydrALAZINE (APRESOLINE) injection 5 mg (has no administration in time range)  zolpidem (AMBIEN) tablet 5 mg (has no administration in time range)  atorvastatin (LIPITOR) tablet 40 mg (has no administration in time range)  ezetimibe (ZETIA) tablet 10 mg (has no administration in time range)  ALPRAZolam (XANAX) tablet 0.125-0.25 mg (has no administration in time range)  buPROPion (WELLBUTRIN XL) 24 hr tablet 300 mg (has no administration in time range)  venlafaxine (EFFEXOR) tablet 75 mg (has no administration in time range)  pantoprazole (PROTONIX) EC tablet 40  mg (has no administration in time range)  vitamin C (ASCORBIC ACID) tablet 1,000 mg (has no administration in time range)  cholecalciferol (VITAMIN D3) tablet 2,000 Units (has no administration in time range)  multivitamin with minerals tablet 1 tablet (has no administration in time range)  omega-3 acid ethyl esters (LOVAZA) capsule 1 g (has no administration in time range)  LORazepam (ATIVAN) injection 0.5 mg (0.5 mg Intravenous Given 05/20/18 2012)     Initial Impression / Assessment and Plan / ED Course  I have reviewed the triage vital signs and the nursing notes.  Pertinent labs & imaging results that were available during my care of the patient were reviewed by me and considered in my medical decision making (see chart for details).     77 yo F with possible TIA.  Had some difficulty with word finding it sounds like an left-sided weakness.  Will obtain a CT of the head lab work discussed with neurology.  Discussed with Dr. Leonel Ramsay, recommended TIA Obs.  Will discuss with hospitalist.  Labs reviewed patient has a mild anemia at 9.9.  Her urine was negative for infection.  CT of the head was reviewed by me without intracranial bleeding.  The patients results and plan were reviewed and discussed.   Any x-rays performed were independently reviewed by myself.   Differential diagnosis were considered with the presenting HPI.  Medications   stroke: mapping our early stages of recovery book (has no administration in time range)  0.9 %  sodium chloride infusion (has no administration in time range)  senna-docusate (Senokot-S) tablet 1 tablet (has no administration in time range)  enoxaparin (LOVENOX) injection 30 mg (has no administration in time range)  ondansetron (ZOFRAN) injection 4 mg (has no administration in time range)  hydrALAZINE (APRESOLINE) injection 5 mg (has no administration in time range)  zolpidem (AMBIEN) tablet 5 mg (has no administration in time range)    atorvastatin (LIPITOR) tablet 40 mg (has no administration in time range)  ezetimibe (ZETIA) tablet 10 mg (has no administration in time range)  ALPRAZolam (XANAX) tablet 0.125-0.25 mg (has no administration in time range)  buPROPion (WELLBUTRIN XL) 24 hr tablet 300 mg (has no administration in time range)  venlafaxine (EFFEXOR) tablet 75 mg (has no administration in time range)  pantoprazole (PROTONIX) EC tablet 40 mg (has no administration in time range)  vitamin C (ASCORBIC ACID) tablet 1,000 mg (has no administration in time range)  cholecalciferol (VITAMIN D3) tablet 2,000 Units (has no administration in time range)  multivitamin with minerals tablet 1 tablet (has no administration in time range)  omega-3 acid ethyl esters (LOVAZA) capsule 1 g (has no administration in time range)  LORazepam (ATIVAN) injection 0.5 mg (0.5 mg Intravenous Given 05/20/18 2012)    Vitals:   05/20/18 1818 05/20/18 1825 05/20/18 1830 05/20/18 2233  BP:  (!) 216/85 (!) 208/83 (!) 181/68  Pulse:  72 74 67  Resp:  16 (!) 21 18  Temp:  98.3 F (36.8 C)  98.2 F (36.8 C)  TempSrc:  Oral  Oral  SpO2:  100% 100% 100%  Weight: 51.7 kg   54.7 kg  Height:  5\' 1"  (1.549 m)   5' 0.5" (1.537 m)    Final diagnoses:  TIA (transient ischemic attack)    Admission/ observation were discussed with the admitting physician, patient and/or family and they are comfortable with the plan.    Final Clinical Impressions(s) / ED Diagnoses   Final diagnoses:  TIA (transient ischemic attack)    ED Discharge Orders    None       Deno Etienne, DO 05/20/18 2308

## 2018-05-20 NOTE — ED Notes (Signed)
Patient transported to MRI 

## 2018-05-21 ENCOUNTER — Observation Stay (HOSPITAL_COMMUNITY): Payer: Medicare Other

## 2018-05-21 ENCOUNTER — Other Ambulatory Visit: Payer: Self-pay | Admitting: *Deleted

## 2018-05-21 ENCOUNTER — Observation Stay (HOSPITAL_BASED_OUTPATIENT_CLINIC_OR_DEPARTMENT_OTHER): Payer: Medicare Other

## 2018-05-21 DIAGNOSIS — Z79899 Other long term (current) drug therapy: Secondary | ICD-10-CM | POA: Diagnosis not present

## 2018-05-21 DIAGNOSIS — F419 Anxiety disorder, unspecified: Secondary | ICD-10-CM | POA: Diagnosis not present

## 2018-05-21 DIAGNOSIS — D649 Anemia, unspecified: Secondary | ICD-10-CM | POA: Diagnosis not present

## 2018-05-21 DIAGNOSIS — E785 Hyperlipidemia, unspecified: Secondary | ICD-10-CM | POA: Diagnosis not present

## 2018-05-21 DIAGNOSIS — K219 Gastro-esophageal reflux disease without esophagitis: Secondary | ICD-10-CM | POA: Diagnosis not present

## 2018-05-21 DIAGNOSIS — R600 Localized edema: Secondary | ICD-10-CM | POA: Diagnosis not present

## 2018-05-21 DIAGNOSIS — I340 Nonrheumatic mitral (valve) insufficiency: Secondary | ICD-10-CM | POA: Diagnosis not present

## 2018-05-21 DIAGNOSIS — I509 Heart failure, unspecified: Secondary | ICD-10-CM | POA: Diagnosis not present

## 2018-05-21 DIAGNOSIS — I10 Essential (primary) hypertension: Secondary | ICD-10-CM | POA: Diagnosis not present

## 2018-05-21 DIAGNOSIS — M5030 Other cervical disc degeneration, unspecified cervical region: Secondary | ICD-10-CM | POA: Diagnosis present

## 2018-05-21 DIAGNOSIS — I6522 Occlusion and stenosis of left carotid artery: Secondary | ICD-10-CM | POA: Diagnosis not present

## 2018-05-21 DIAGNOSIS — I130 Hypertensive heart and chronic kidney disease with heart failure and stage 1 through stage 4 chronic kidney disease, or unspecified chronic kidney disease: Secondary | ICD-10-CM | POA: Diagnosis not present

## 2018-05-21 DIAGNOSIS — F329 Major depressive disorder, single episode, unspecified: Secondary | ICD-10-CM | POA: Diagnosis not present

## 2018-05-21 DIAGNOSIS — N183 Chronic kidney disease, stage 3 (moderate): Secondary | ICD-10-CM | POA: Diagnosis not present

## 2018-05-21 DIAGNOSIS — R29818 Other symptoms and signs involving the nervous system: Secondary | ICD-10-CM | POA: Diagnosis not present

## 2018-05-21 DIAGNOSIS — M4802 Spinal stenosis, cervical region: Secondary | ICD-10-CM | POA: Diagnosis not present

## 2018-05-21 DIAGNOSIS — Z7982 Long term (current) use of aspirin: Secondary | ICD-10-CM | POA: Diagnosis not present

## 2018-05-21 DIAGNOSIS — G459 Transient cerebral ischemic attack, unspecified: Secondary | ICD-10-CM | POA: Diagnosis not present

## 2018-05-21 LAB — ECHOCARDIOGRAM COMPLETE
Height: 60.5 in
Weight: 1929.47 oz

## 2018-05-21 LAB — LIPID PANEL
Cholesterol: 174 mg/dL (ref 0–200)
HDL: 71 mg/dL (ref >40)
LDL Cholesterol: 91 mg/dL (ref 0–99)
Total CHOL/HDL Ratio: 2.5 RATIO
Triglycerides: 59 mg/dL (ref <150)
VLDL: 12 mg/dL (ref 0–40)

## 2018-05-21 LAB — HEMOGLOBIN A1C
Hgb A1c MFr Bld: 5.4 % (ref 4.8–5.6)
Mean Plasma Glucose: 108.28 mg/dL

## 2018-05-21 MED ORDER — ASPIRIN 325 MG PO TBEC: 325.0000 mg | DELAYED_RELEASE_TABLET | Freq: Every day | ORAL | 0 refills | Status: DC

## 2018-05-21 MED ORDER — ASPIRIN EC 325 MG PO TBEC
325.0000 mg | DELAYED_RELEASE_TABLET | Freq: Every day | ORAL | Status: DC
  Administered 2018-05-21: 325 mg via ORAL
  Filled 2018-05-21: qty 1

## 2018-05-21 NOTE — H&P (View-Only) (Signed)
Hospital Consult    Reason for Consult:  TIA with carotid artery stenosis Requesting Physician:  Dr. Reesa Chew MRN #:  [id number]  History of Present Illness: This is a 77 y.o. female with past medical history significant for hypertension, hyperlipidemia, CAD, CKD stage III, chronic back pain, and carotid artery stenosis admitted to the hospital yesterday with an episode of expressive aphasia.  Work-up has included a CT head and MRI brain which are negative for acute CVA.  Neurology has been consulted and have diagnosed patient with a TIA likely related to high-grade left ICA stenosis.  Echocardiogram negative.  This is a patient well-known to  [facility] as she has had regular carotid duplex every 6 months demonstrating a 60 to 79% asymptomatic stenosis of left ICA.  CTA during this admission demonstrates closer to an 80% stenosis of left ICA.  Right ICA estimated to be 50% stenosis.  She describes a story of talking on her phone yesterday with a friend and suddenly could not think of the right words to say.  This was alarming to her however resolved after a few minutes.  She has chronic left leg weakness related to her lumbar spine.  She denies any further strokelike symptoms.  She is taking an aspirin and a statin daily.  She is a non-smoker.  Past Medical History:  Diagnosis Date  . Arthritis   . Carotid arterial disease ([place])   . Constipation   . Degeneration of lumbar intervertebral disc   . Dislocation of hip joint prosthesis ([place])   . Fusion of spine of lumbar region   . Hyperlipidemia   . Hypertension   . Low back pain    Radiating to left leg  . Osteoarthritis   . Osteopenia   . Sleep difficulties   . Spinal stenosis at L4-L5 level     Past Surgical History:  Procedure Laterality Date  . BACK SURGERY    . BUNIONECTOMY    . EYE SURGERY    . FOOT SURGERY     Status post right foot surgery  . HAND FUSION     Right thumb  . JOINT REPLACEMENT Left 2014   Hip  . LUMBAR FUSION     Anterior and posterior  . OTHER SURGICAL HISTORY     post fusion of her right thumb  . PILONIDAL CYST / SINUS EXCISION    . TENDON REPAIR     left hip  . TENDON REPAIR     Reattachment of tendon- Left hip  . TONSILLECTOMY AND ADENOIDECTOMY    . TOTAL HIP ARTHROPLASTY Left 09/04/2012   Procedure: LEFT TOTAL HIP ARTHROPLASTY ANTERIOR APPROACH;  Surgeon: Mcarthur Rossetti, MD;  Location: [facility];  Service: Orthopedics;  Laterality: Left;  Left Total Hip Arthroplasty, Anterior Approach    Allergies  Allergen Reactions  . Oxycodone-Acetaminophen Anaphylaxis  . Celebrex [Celecoxib]     Pt does not remember if this is truly an allergy. Hallucinations  . Hydrocodone   . Hydrocodone-Acetaminophen Nausea And Vomiting  . Oxycodone     Prior to Admission medications   Medication Sig Start Date End Date Taking? Authorizing Provider  ALPRAZolam (XANAX) 0.25 MG tablet Take 0.125-0.25 mg by mouth at bedtime as needed for sleep.    Yes [provider]  Ascorbic Acid (VITAMIN C PO) Take 1,000 mg by mouth 2 (two) times daily.    Yes [provider]  aspirin EC 81 MG tablet Take 81 mg by mouth at bedtime.  Yes [provider]  atorvastatin (LIPITOR) 40 MG tablet Take 40 mg by mouth daily. 01/29/15  Yes [provider]  bisoprolol (ZEBETA) 5 MG tablet Take 2 tablets (10 mg total) by mouth daily. 05/14/18  Yes [place], [name], MD  buPROPion (WELLBUTRIN XL) 300 MG 24 hr tablet Take 300 mg by mouth daily.   Yes [provider]  CALCIUM PO Take 1,200 mg by mouth daily.    Yes [provider]  Cholecalciferol (VITAMIN D) 2000 units tablet Take 2,000 Units by mouth as directed.   Yes [provider]  ezetimibe (ZETIA) 10 MG tablet Take 10 mg by mouth daily.   Yes [provider]  ibandronate (BONIVA) 150 MG tablet Take 150 mg by mouth every 30 (thirty) days.  11/13/16  Yes [provider]  losartan (COZAAR) 50 MG tablet Take 50  mg by mouth daily.   Yes [provider]  meloxicam (MOBIC) 15 MG tablet Take 15 mg by mouth as needed for pain.    Yes [provider]  Multiple Vitamin (MULTIVITAMIN) tablet Take 1 tablet by mouth daily.     Yes [provider]  Omega-3 Fatty Acids (FISH OIL PO) Take 1,400 mg by mouth 2 (two) times daily.    Yes [provider]  omeprazole (PRILOSEC) 20 MG capsule Take 20 mg by mouth daily.   Yes [provider]  senna (SENOKOT) 8.6 MG TABS tablet Take 1 tablet by mouth at bedtime as needed for mild constipation.   Yes [provider]  venlafaxine (EFFEXOR) 25 MG tablet Take 75 mg by mouth daily.    Yes [provider]    Social History   Socioeconomic History  . Marital status: Married    Spouse name: Not on file  . Number of children: 2  . Years of education: Not on file  . Highest education level: Not on file  Occupational History  . 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 Financial resource strain: Not on file  . Food insecurity:    Worry: Not on file    Inability: Not on file  . Transportation needs:    Medical: Not on file    Non-medical: Not on file  Tobacco Use  . Smoking status: Never Smoker  . Smokeless tobacco: Never Used  Substance and Sexual Activity  . Alcohol use: Yes    Alcohol/week: 0.0 standard drinks    Comment: 1 gl wine biweekly  . Drug use: No  . Sexual activity: Yes    Partners: Male    Comment: 1st intercourse- 1, partners- 60, married- 82 yrs   Lifestyle  . Physical activity:    Days per week: Not on file    Minutes per session: Not on file  . Stress: Not on file  Relationships  . Social connections:    Talks on phone: Not on file    Gets together: Not on file    Attends religious service: Not on file    Active member of club or organization: Not on file    Attends meetings of clubs or organizations: Not on file    Relationship  status: Not on file  . Intimate partner violence:    Fear of current or ex partner: Not on file    Emotionally abused: Not on file    Physically abused: Not on file    Forced sexual activity: Not on file  Other Topics Concern  . Not on file  Social History Narrative  . Not on file     Family History  Problem Relation Age of Onset  . Heart failure Mother   . Cancer Mother        colon  . Hypertension Mother   . Other Mother        Varicose Veins  . Diabetes Father   . Heart disease Father   . Hypertension Father   . Heart attack Father   . Cancer Brother        squamos cell - head, neck, ear    ROS: Otherwise negative unless mentioned in HPI  Physical Examination  Vitals:   05/21/18 0949 05/21/18 1202  BP: (!) 158/67 (!) 161/78  Pulse: 67 70  Resp: 20 19  Temp: 97.7 F (36.5 C) 97.8 F (36.6 C)  SpO2: 100% 100%   Body mass index is 23.16 kg/m.  General:  WDWN in NAD Gait: Not observed HENT: WNL, normocephalic Pulmonary: normal non-labored breathing, without Rales, rhonchi,  wheezing Cardiac: tachycardic, without  Murmurs, rubs or gallops; L neck bruit Abdomen:  soft, NT/ND, no masses Skin: without rashes Vascular Exam/Pulses: symmetrical radial pulses Extremities: without ischemic changes, without Gangrene , without cellulitis; without open wounds;  Musculoskeletal: no muscle wasting or atrophy  Neurologic: A&O X 3;  No focal weakness or paresthesias are detected; speech is fluent/normal; L SLR weaker than R; symmetrical grip strength  Psychiatric:  The pt has Normal affect. Lymph:  Unremarkable  CBC    Component Value Date/Time   WBC 5.4 05/20/2018 1833   RBC 3.18 (L) 05/20/2018 1833   HGB 9.9 (L) 05/20/2018 1849   HCT 29.0 (L) 05/20/2018 1849   PLT 279 05/20/2018 1833   MCV 100.0 05/20/2018 1833   MCH 32.7 05/20/2018 1833   MCHC 32.7 05/20/2018 1833   RDW 12.2 05/20/2018 1833   LYMPHSABS 0.9 05/20/2018 1833   MONOABS 0.5 05/20/2018 1833    EOSABS 0.0 05/20/2018 1833   BASOSABS 0.0 05/20/2018 1833    BMET    Component Value Date/Time   NA 143 05/20/2018 1849   NA 142 05/13/2018 1155   K 3.6 05/20/2018 1849   CL 106 05/20/2018 1849   CO2 23 05/20/2018 1833   GLUCOSE 97 05/20/2018 1849   BUN 21 05/20/2018 1849   BUN 20 05/13/2018 1155   CREATININE 1.20 (H) 05/20/2018 1849   CALCIUM 9.5 05/20/2018 1833   GFRNONAA 40 (L) 05/20/2018 1833   GFRAA 46 (L) 05/20/2018 1833    COAGS: Lab Results  Component Value Date   INR 0.96 05/20/2018   INR 0.92 08/21/2012     Non-Invasive Vascular Imaging:   IMPRESSION: CTA NECK:  1. Hemodynamically significant 80% stenosis LEFT ICA. Less than 50% stenosis RIGHT ICA. 2. Patent bilateral vertebral arteries.  CTA HEAD:  1. No emergent large vessel occlusion or flow-limiting stenosis. 2. Mild intracranial atherosclerosis.  MRI BRAIN IMPRESSION: 1. No acute abnormality. 2. Chronic small vessel ischemia.  Statin:  Yes.   Beta Blocker:  Yes.   Aspirin:  Yes.   ACEI:  No. ARB:  Yes.   CCB use:  No Other antiplatelets/anticoagulants:  No.    ASSESSMENT/PLAN: This is a 77 y.o. female with symptomatic L ICA stenosis  TIA likely related to 80% L ICA stenosis, which was noted on CTA neck Agree with aspirin and statin Patient will require revascularization of L ICA On call surgeon Dr. Carlis Abbott will evaluate the  patient later today and further discuss treatment plan   Dagoberto Ligas PA-C [facility] [phone]  I have seen and evaluated the patient. I agree with the PA note as documented above. 77 yo F with high grade L ICA stenosis admitted with TIA.  Symptoms resolved and now neuro intact.  Followed by Dr. [name] as outpatient and last seen this summer with asymptomatic moderate L ICA stenosis.  Will arrange L CEA with Dr. [name] on Tuesday.  [place] for discharge from our standpoint on ASA/statin.    Marty Heck, MD [facility] [place] Office: [phone] Pager: [facility]

## 2018-05-21 NOTE — Plan of Care (Signed)
  Problem: Education: Goal: Knowledge of General Education information will improve Description Including pain rating scale, medication(s)/side effects and non-pharmacologic comfort measures Outcome: Completed/Met   Problem: Health Behavior/Discharge Planning: Goal: Ability to manage health-related needs will improve Outcome: Completed/Met   Problem: Clinical Measurements: Goal: Ability to maintain clinical measurements within normal limits will improve Outcome: Completed/Met Goal: Will remain free from infection Outcome: Completed/Met Goal: Diagnostic test results will improve Outcome: Completed/Met Goal: Respiratory complications will improve Outcome: Completed/Met Goal: Cardiovascular complication will be avoided Outcome: Completed/Met   Problem: Activity: Goal: Risk for activity intolerance will decrease Outcome: Completed/Met   Problem: Nutrition: Goal: Adequate nutrition will be maintained Outcome: Completed/Met   Problem: Coping: Goal: Level of anxiety will decrease Outcome: Completed/Met   Problem: Elimination: Goal: Will not experience complications related to bowel motility Outcome: Completed/Met Goal: Will not experience complications related to urinary retention Outcome: Completed/Met   Problem: Pain Managment: Goal: General experience of comfort will improve Outcome: Completed/Met   Problem: Safety: Goal: Ability to remain free from injury will improve Outcome: Completed/Met   Problem: Skin Integrity: Goal: Risk for impaired skin integrity will decrease Outcome: Completed/Met   Problem: Education: Goal: Knowledge of disease or condition will improve Outcome: Completed/Met Goal: Knowledge of secondary prevention will improve Outcome: Completed/Met Goal: Knowledge of patient specific risk factors addressed and post discharge goals established will improve Outcome: Completed/Met Goal: Individualized Educational Video(s) Outcome: Completed/Met    Problem: Coping: Goal: Will verbalize positive feelings about self Outcome: Completed/Met   Problem: Education: Goal: Individualized Educational Video(s) Outcome: Completed/Met Goal: Knowledge of disease or condition will improve Outcome: Completed/Met Goal: Knowledge of secondary prevention will improve Outcome: Completed/Met Goal: Knowledge of patient specific risk factors addressed and post discharge goals established will improve Outcome: Completed/Met   Problem: Coping: Goal: Will verbalize positive feelings about self Outcome: Completed/Met Goal: Will identify appropriate support needs Outcome: Completed/Met   Problem: Health Behavior/Discharge Planning: Goal: Ability to manage health-related needs will improve Outcome: Completed/Met   Problem: Self-Care: Goal: Ability to participate in self-care as condition permits will improve Outcome: Completed/Met Goal: Verbalization of feelings and concerns over difficulty with self-care will improve Outcome: Completed/Met Goal: Ability to communicate needs accurately will improve Outcome: Completed/Met   Problem: Ischemic Stroke/TIA Tissue Perfusion: Goal: Complications of ischemic stroke/TIA will be minimized Outcome: Completed/Met

## 2018-05-21 NOTE — Plan of Care (Signed)

## 2018-05-21 NOTE — Consult Note (Addendum)
Hospital Consult    Reason for Consult:  TIA with carotid artery stenosis Requesting Physician:  Dr. Reesa Chew MRN #:  [id number]  History of Present Illness: This is a 77 y.o. female with past medical history significant for hypertension, hyperlipidemia, CAD, CKD stage III, chronic back pain, and carotid artery stenosis admitted to the hospital yesterday with an episode of expressive aphasia.  Work-up has included a CT head and MRI brain which are negative for acute CVA.  Neurology has been consulted and have diagnosed patient with a TIA likely related to high-grade left ICA stenosis.  Echocardiogram negative.  This is a patient well-known to  [facility] as she has had regular carotid duplex every 6 months demonstrating a 60 to 79% asymptomatic stenosis of left ICA.  CTA during this admission demonstrates closer to an 80% stenosis of left ICA.  Right ICA estimated to be 50% stenosis.  She describes a story of talking on her phone yesterday with a friend and suddenly could not think of the right words to say.  This was alarming to her however resolved after a few minutes.  She has chronic left leg weakness related to her lumbar spine.  She denies any further strokelike symptoms.  She is taking an aspirin and a statin daily.  She is a non-smoker.  Past Medical History:  Diagnosis Date  . Arthritis   . Carotid arterial disease ([place])   . Constipation   . Degeneration of lumbar intervertebral disc   . Dislocation of hip joint prosthesis ([place])   . Fusion of spine of lumbar region   . Hyperlipidemia   . Hypertension   . Low back pain    Radiating to left leg  . Osteoarthritis   . Osteopenia   . Sleep difficulties   . Spinal stenosis at L4-L5 level     Past Surgical History:  Procedure Laterality Date  . BACK SURGERY    . BUNIONECTOMY    . EYE SURGERY    . FOOT SURGERY     Status post right foot surgery  . HAND FUSION     Right thumb  . JOINT REPLACEMENT Left 2014   Hip  . LUMBAR FUSION     Anterior and posterior  . OTHER SURGICAL HISTORY     post fusion of her right thumb  . PILONIDAL CYST / SINUS EXCISION    . TENDON REPAIR     left hip  . TENDON REPAIR     Reattachment of tendon- Left hip  . TONSILLECTOMY AND ADENOIDECTOMY    . TOTAL HIP ARTHROPLASTY Left 09/04/2012   Procedure: LEFT TOTAL HIP ARTHROPLASTY ANTERIOR APPROACH;  Surgeon: Mcarthur Rossetti, MD;  Location: [facility];  Service: Orthopedics;  Laterality: Left;  Left Total Hip Arthroplasty, Anterior Approach    Allergies  Allergen Reactions  . Oxycodone-Acetaminophen Anaphylaxis  . Celebrex [Celecoxib]     Pt does not remember if this is truly an allergy. Hallucinations  . Hydrocodone   . Hydrocodone-Acetaminophen Nausea And Vomiting  . Oxycodone     Prior to Admission medications   Medication Sig Start Date End Date Taking? Authorizing Provider  ALPRAZolam (XANAX) 0.25 MG tablet Take 0.125-0.25 mg by mouth at bedtime as needed for sleep.    Yes [provider]  Ascorbic Acid (VITAMIN C PO) Take 1,000 mg by mouth 2 (two) times daily.    Yes [provider]  aspirin EC 81 MG tablet Take 81 mg by mouth at bedtime.  Yes [provider]  atorvastatin (LIPITOR) 40 MG tablet Take 40 mg by mouth daily. 01/29/15  Yes [provider]  bisoprolol (ZEBETA) 5 MG tablet Take 2 tablets (10 mg total) by mouth daily. 05/14/18  Yes [place], [name], MD  buPROPion (WELLBUTRIN XL) 300 MG 24 hr tablet Take 300 mg by mouth daily.   Yes [provider]  CALCIUM PO Take 1,200 mg by mouth daily.    Yes [provider]  Cholecalciferol (VITAMIN D) 2000 units tablet Take 2,000 Units by mouth as directed.   Yes [provider]  ezetimibe (ZETIA) 10 MG tablet Take 10 mg by mouth daily.   Yes [provider]  ibandronate (BONIVA) 150 MG tablet Take 150 mg by mouth every 30 (thirty) days.  11/13/16  Yes [provider]  losartan (COZAAR) 50 MG tablet Take 50  mg by mouth daily.   Yes [provider]  meloxicam (MOBIC) 15 MG tablet Take 15 mg by mouth as needed for pain.    Yes [provider]  Multiple Vitamin (MULTIVITAMIN) tablet Take 1 tablet by mouth daily.     Yes [provider]  Omega-3 Fatty Acids (FISH OIL PO) Take 1,400 mg by mouth 2 (two) times daily.    Yes [provider]  omeprazole (PRILOSEC) 20 MG capsule Take 20 mg by mouth daily.   Yes [provider]  senna (SENOKOT) 8.6 MG TABS tablet Take 1 tablet by mouth at bedtime as needed for mild constipation.   Yes [provider]  venlafaxine (EFFEXOR) 25 MG tablet Take 75 mg by mouth daily.    Yes [provider]    Social History   Socioeconomic History  . Marital status: Married    Spouse name: Not on file  . Number of children: 2  . Years of education: Not on file  . Highest education level: Not on file  Occupational History  . 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 Financial resource strain: Not on file  . Food insecurity:    Worry: Not on file    Inability: Not on file  . Transportation needs:    Medical: Not on file    Non-medical: Not on file  Tobacco Use  . Smoking status: Never Smoker  . Smokeless tobacco: Never Used  Substance and Sexual Activity  . Alcohol use: Yes    Alcohol/week: 0.0 standard drinks    Comment: 1 gl wine biweekly  . Drug use: No  . Sexual activity: Yes    Partners: Male    Comment: 1st intercourse- 65, partners- 92, married- 78 yrs   Lifestyle  . Physical activity:    Days per week: Not on file    Minutes per session: Not on file  . Stress: Not on file  Relationships  . Social connections:    Talks on phone: Not on file    Gets together: Not on file    Attends religious service: Not on file    Active member of club or organization: Not on file    Attends meetings of clubs or organizations: Not on file    Relationship  status: Not on file  . Intimate partner violence:    Fear of current or ex partner: Not on file    Emotionally abused: Not on file    Physically abused: Not on file    Forced sexual activity: Not on file  Other Topics Concern  . Not on file  Social History Narrative  . Not on file     Family History  Problem Relation Age of Onset  . Heart failure Mother   . Cancer Mother        colon  . Hypertension Mother   . Other Mother        Varicose Veins  . Diabetes Father   . Heart disease Father   . Hypertension Father   . Heart attack Father   . Cancer Brother        squamos cell - head, neck, ear    ROS: Otherwise negative unless mentioned in HPI  Physical Examination  Vitals:   05/21/18 0949 05/21/18 1202  BP: (!) 158/67 (!) 161/78  Pulse: 67 70  Resp: 20 19  Temp: 97.7 F (36.5 C) 97.8 F (36.6 C)  SpO2: 100% 100%   Body mass index is 23.16 kg/m.  General:  WDWN in NAD Gait: Not observed HENT: WNL, normocephalic Pulmonary: normal non-labored breathing, without Rales, rhonchi,  wheezing Cardiac: tachycardic, without  Murmurs, rubs or gallops; L neck bruit Abdomen:  soft, NT/ND, no masses Skin: without rashes Vascular Exam/Pulses: symmetrical radial pulses Extremities: without ischemic changes, without Gangrene , without cellulitis; without open wounds;  Musculoskeletal: no muscle wasting or atrophy  Neurologic: A&O X 3;  No focal weakness or paresthesias are detected; speech is fluent/normal; L SLR weaker than R; symmetrical grip strength  Psychiatric:  The pt has Normal affect. Lymph:  Unremarkable  CBC    Component Value Date/Time   WBC 5.4 05/20/2018 1833   RBC 3.18 (L) 05/20/2018 1833   HGB 9.9 (L) 05/20/2018 1849   HCT 29.0 (L) 05/20/2018 1849   PLT 279 05/20/2018 1833   MCV 100.0 05/20/2018 1833   MCH 32.7 05/20/2018 1833   MCHC 32.7 05/20/2018 1833   RDW 12.2 05/20/2018 1833   LYMPHSABS 0.9 05/20/2018 1833   MONOABS 0.5 05/20/2018 1833    EOSABS 0.0 05/20/2018 1833   BASOSABS 0.0 05/20/2018 1833    BMET    Component Value Date/Time   NA 143 05/20/2018 1849   NA 142 05/13/2018 1155   K 3.6 05/20/2018 1849   CL 106 05/20/2018 1849   CO2 23 05/20/2018 1833   GLUCOSE 97 05/20/2018 1849   BUN 21 05/20/2018 1849   BUN 20 05/13/2018 1155   CREATININE 1.20 (H) 05/20/2018 1849   CALCIUM 9.5 05/20/2018 1833   GFRNONAA 40 (L) 05/20/2018 1833   GFRAA 46 (L) 05/20/2018 1833    COAGS: Lab Results  Component Value Date   INR 0.96 05/20/2018   INR 0.92 08/21/2012     Non-Invasive Vascular Imaging:   IMPRESSION: CTA NECK:  1. Hemodynamically significant 80% stenosis LEFT ICA. Less than 50% stenosis RIGHT ICA. 2. Patent bilateral vertebral arteries.  CTA HEAD:  1. No emergent large vessel occlusion or flow-limiting stenosis. 2. Mild intracranial atherosclerosis.  MRI BRAIN IMPRESSION: 1. No acute abnormality. 2. Chronic small vessel ischemia.  Statin:  Yes.   Beta Blocker:  Yes.   Aspirin:  Yes.   ACEI:  No. ARB:  Yes.   CCB use:  No Other antiplatelets/anticoagulants:  No.    ASSESSMENT/PLAN: This is a 77 y.o. female with symptomatic L ICA stenosis  TIA likely related to 80% L ICA stenosis, which was noted on CTA neck Agree with aspirin and statin Patient will require revascularization of L ICA On call surgeon Dr. Carlis Abbott will evaluate the  patient later today and further discuss treatment plan   Dagoberto Ligas PA-C [facility] [phone]  I have seen and evaluated the patient. I agree with the PA note as documented above. 77 yo F with high grade L ICA stenosis admitted with TIA.  Symptoms resolved and now neuro intact.  Followed by Dr. [name] as outpatient and last seen this summer with asymptomatic moderate L ICA stenosis.  Will arrange L CEA with Dr. [name] on Tuesday.  [place] for discharge from our standpoint on ASA/statin.    Marty Heck, MD [facility] [place] Office: [phone] Pager: [place]

## 2018-05-21 NOTE — Progress Notes (Addendum)
STROKE TEAM PROGRESS NOTE   INTERVAL HISTORY Her husband and dtr are at the bedside.  She was talking on the phone yesterday - 1st half of conversation ok. 2nd half, having a hard time remembering what she was trying to say. Lasted a few minutes. No previous episodes   Vitals:   05/21/18 0353 05/21/18 0423 05/21/18 0501 05/21/18 0949  BP: 140/64 122/69 (!) 143/66 (!) 158/67  Pulse: 67 68 66 67  Resp: 16 14 18 20   Temp:   98.3 F (36.8 C) 97.7 F (36.5 C)  TempSrc:   Oral Oral  SpO2: 97% 97% 98% 100%  Weight:      Height:        CBC:  Recent Labs  Lab 05/20/18 1833 05/20/18 1849  WBC 5.4  --   NEUTROABS 3.9  --   HGB 10.4* 9.9*  HCT 31.8* 29.0*  MCV 100.0  --   PLT 279  --     Basic Metabolic Panel:  Recent Labs  Lab 05/20/18 1833 05/20/18 1849  NA 139 143  K 3.7 3.6  CL 105 106  CO2 23  --   GLUCOSE 105* 97  BUN 21 21  CREATININE 1.26* 1.20*  CALCIUM 9.5  --    Lipid Panel:     Component Value Date/Time   CHOL 174 05/21/2018 0412   TRIG 59 05/21/2018 0412   HDL 71 05/21/2018 0412   CHOLHDL 2.5 05/21/2018 0412   VLDL 12 05/21/2018 0412   LDLCALC 91 05/21/2018 0412   HgbA1c:  Lab Results  Component Value Date   HGBA1C 5.4 05/21/2018   Urine Drug Screen:     Component Value Date/Time   LABOPIA NONE DETECTED 05/20/2018 1831   COCAINSCRNUR NONE DETECTED 05/20/2018 1831   LABBENZ POSITIVE (A) 05/20/2018 1831   AMPHETMU NONE DETECTED 05/20/2018 1831   THCU NONE DETECTED 05/20/2018 1831   LABBARB NONE DETECTED 05/20/2018 1831    Alcohol Level     Component Value Date/Time   ETH <10 05/20/2018 1833    IMAGING Ct Angio Head W Or Wo Contrast  Result Date: 05/21/2018 CLINICAL DATA:  Follow up code stroke. History of carotid artery disease, hypertension, hyperlipidemia. EXAM: CT ANGIOGRAPHY HEAD AND NECK TECHNIQUE: Multidetector CT imaging of the head and neck was performed using the standard protocol during bolus administration of intravenous  contrast. Multiplanar CT image reconstructions and MIPs were obtained to evaluate the vascular anatomy. Carotid stenosis measurements (when applicable) are obtained utilizing NASCET criteria, using the distal internal carotid diameter as the denominator. CONTRAST:  1mL ISOVUE-370 IOPAMIDOL (ISOVUE-370) INJECTION 76% COMPARISON:  MRI of the head May 20, 2018 FINDINGS: CTA NECK FINDINGS: AORTIC ARCH: Normal appearance of the thoracic arch, normal branch pattern. Mild calcific atherosclerosis. The origins of the innominate, left Common carotid artery and subclavian artery are widely patent. RIGHT CAROTID SYSTEM: Common carotid artery is patent. Calcific atherosclerosis resulting in less than 50% stenosis RIGHT ICA. LEFT CAROTID SYSTEM: Common carotid artery is patent. Calcific atherosclerosis resulting in 1 cm segment of 80% stenosis by NASCET criteria. Mild poststenotic dilatation. VERTEBRAL ARTERIES:Codominant vertebral arteries. Normal appearance of the vertebral arteries, widely patent. SKELETON: No acute osseous process though bone windows have not been submitted. OTHER NECK: Soft tissues of the neck are nonacute though, not tailored for evaluation. UPPER CHEST: Mild biapical pleural thickening. No superior mediastinal lymphadenopathy. CTA HEAD FINDINGS: ANTERIOR CIRCULATION: Patent cervical internal carotid arteries, petrous, cavernous and supra clinoid internal carotid arteries. Patent anterior communicating artery.  Patent anterior and middle cerebral arteries, mild luminal irregularity compatible with atherosclerosis. No large vessel occlusion, significant stenosis, contrast extravasation or aneurysm. POSTERIOR CIRCULATION: Patent vertebral arteries, vertebrobasilar junction and basilar artery, as well as main branch vessels. Patent posterior cerebral arteries, mild luminal irregularity compatible with atherosclerosis. No large vessel occlusion, significant stenosis, contrast extravasation or aneurysm.  VENOUS SINUSES: Major dural venous sinuses are patent though not tailored for evaluation on this angiographic examination. ANATOMIC VARIANTS: None. DELAYED PHASE: No abnormal intracranial enhancement. MIP images reviewed. IMPRESSION: CTA NECK: 1. Hemodynamically significant 80% stenosis LEFT ICA. Less than 50% stenosis RIGHT ICA. 2. Patent bilateral vertebral arteries. CTA HEAD: 1. No emergent large vessel occlusion or flow-limiting stenosis. 2. Mild intracranial atherosclerosis. Aortic Atherosclerosis (ICD10-I70.0). Electronically Signed   By: Elon Alas M.D.   On: 05/21/2018 00:57   Ct Head Wo Contrast  Result Date: 05/20/2018 CLINICAL DATA:  Episodic left-sided weakness and aphasia this afternoon lasting 15-20 minutes now resolved. EXAM: CT HEAD WITHOUT CONTRAST TECHNIQUE: Contiguous axial images were obtained from the base of the skull through the vertex without intravenous contrast. COMPARISON:  None. FINDINGS: Brain: Mild-to-moderate small vessel ischemic disease of periventricular white matter with age related involutional changes of the brain. No intra-axial mass, extra-axial fluid collection, midline shift or edema. No hydrocephalus. Midline fourth ventricle and basal cisterns without effacement. Vascular: Atherosclerosis of the carotid siphons. No hyperdense vessels. Skull: Intact Sinuses/Orbits: Bilateral lens replacements. Clear paranasal sinuses and mastoids. Osteoarthritis of the temporomandibular joints with subchondral cystic change of the condylar heads of the mandible. Other: None IMPRESSION: 1. Mild-to-moderate small vessel ischemic disease of periventricular white matter. No acute intracranial abnormality. 2. Bilateral TMJ osteoarthritis. Electronically Signed   By: Ashley Royalty M.D.   On: 05/20/2018 20:04   Ct Angio Neck W Or Wo Contrast  Result Date: 05/21/2018 CLINICAL DATA:  Follow up code stroke. History of carotid artery disease, hypertension, hyperlipidemia. EXAM: CT  ANGIOGRAPHY HEAD AND NECK TECHNIQUE: Multidetector CT imaging of the head and neck was performed using the standard protocol during bolus administration of intravenous contrast. Multiplanar CT image reconstructions and MIPs were obtained to evaluate the vascular anatomy. Carotid stenosis measurements (when applicable) are obtained utilizing NASCET criteria, using the distal internal carotid diameter as the denominator. CONTRAST:  60mL ISOVUE-370 IOPAMIDOL (ISOVUE-370) INJECTION 76% COMPARISON:  MRI of the head May 20, 2018 FINDINGS: CTA NECK FINDINGS: AORTIC ARCH: Normal appearance of the thoracic arch, normal branch pattern. Mild calcific atherosclerosis. The origins of the innominate, left Common carotid artery and subclavian artery are widely patent. RIGHT CAROTID SYSTEM: Common carotid artery is patent. Calcific atherosclerosis resulting in less than 50% stenosis RIGHT ICA. LEFT CAROTID SYSTEM: Common carotid artery is patent. Calcific atherosclerosis resulting in 1 cm segment of 80% stenosis by NASCET criteria. Mild poststenotic dilatation. VERTEBRAL ARTERIES:Codominant vertebral arteries. Normal appearance of the vertebral arteries, widely patent. SKELETON: No acute osseous process though bone windows have not been submitted. OTHER NECK: Soft tissues of the neck are nonacute though, not tailored for evaluation. UPPER CHEST: Mild biapical pleural thickening. No superior mediastinal lymphadenopathy. CTA HEAD FINDINGS: ANTERIOR CIRCULATION: Patent cervical internal carotid arteries, petrous, cavernous and supra clinoid internal carotid arteries. Patent anterior communicating artery. Patent anterior and middle cerebral arteries, mild luminal irregularity compatible with atherosclerosis. No large vessel occlusion, significant stenosis, contrast extravasation or aneurysm. POSTERIOR CIRCULATION: Patent vertebral arteries, vertebrobasilar junction and basilar artery, as well as main branch vessels. Patent  posterior cerebral arteries, mild luminal irregularity compatible with atherosclerosis.  No large vessel occlusion, significant stenosis, contrast extravasation or aneurysm. VENOUS SINUSES: Major dural venous sinuses are patent though not tailored for evaluation on this angiographic examination. ANATOMIC VARIANTS: None. DELAYED PHASE: No abnormal intracranial enhancement. MIP images reviewed. IMPRESSION: CTA NECK: 1. Hemodynamically significant 80% stenosis LEFT ICA. Less than 50% stenosis RIGHT ICA. 2. Patent bilateral vertebral arteries. CTA HEAD: 1. No emergent large vessel occlusion or flow-limiting stenosis. 2. Mild intracranial atherosclerosis. Aortic Atherosclerosis (ICD10-I70.0). Electronically Signed   By: Elon Alas M.D.   On: 05/21/2018 00:57   Mr Brain Wo Contrast  Result Date: 05/20/2018 CLINICAL DATA:  Left-sided weakness and aphasia EXAM: MRI HEAD WITHOUT CONTRAST TECHNIQUE: Multiplanar, multiecho pulse sequences of the brain and surrounding structures were obtained without intravenous contrast. COMPARISON:  None. FINDINGS: BRAIN: There is no acute infarct, acute hemorrhage, hydrocephalus or extra-axial collection. The midline structures are normal. No midline shift or other mass effect. There are no old infarcts. Multifocal white matter hyperintensity, most commonly due to chronic ischemic microangiopathy. The cerebral and cerebellar volume are age-appropriate. Susceptibility-sensitive sequences show no chronic microhemorrhage or superficial siderosis. VASCULAR: Major intracranial arterial and venous sinus flow voids are normal. SKULL AND UPPER CERVICAL SPINE: Calvarial bone marrow signal is normal. There is no skull base mass. Visualized upper cervical spine and soft tissues are normal. SINUSES/ORBITS: No fluid levels or advanced mucosal thickening. No mastoid or middle ear effusion. The orbits are normal. IMPRESSION: 1. No acute abnormality. 2. Chronic small vessel ischemia.  Electronically Signed   By: Ulyses Jarred M.D.   On: 05/20/2018 21:20   Mr Cervical Spine Wo Contrast  Result Date: 05/21/2018 CLINICAL DATA:  LEFT-sided weakness, now resolved. Gait imbalance and baseline tremor. EXAM: MRI CERVICAL SPINE WITHOUT CONTRAST TECHNIQUE: Multiplanar, multisequence MR imaging of the cervical spine was performed. No intravenous contrast was administered. COMPARISON:  CT angiogram head and neck May 21, 2018 FINDINGS: Motion degraded examination: Moderate motion on sagittal sequences, severely motion degraded axial sequences, nondiagnostic axial T2 gradient. ALIGNMENT: Maintained cervical lordosis.  No malalignment. VERTEBRAE/DISCS: Vertebral bodies are intact. Moderate C3-4, C4-5, C5-6 and moderate to severe C6-7 disc height loss with diffuse disc desiccation. Moderate acute on chronic discogenic endplate changes P6-1. Mild chronic discogenic endplate changes P5-0 through C5-6. Bilateral C8 1 cm perineural cysts. Heterogeneous bone marrow signal most compatible with osteopenia. CORD:Cervical spinal cord is normal morphology and signal characteristics from the cervicomedullary junction to level of T3-4, the most caudal well-visualized level to motion degrades sensitivity for subtle potential signal abnormality. No syrinx. POSTERIOR FOSSA, VERTEBRAL ARTERIES, PARASPINAL TISSUES: No MR findings of ligamentous injury. Vertebral artery flow voids present. Included posterior fossa and paraspinal soft tissues are normal. DISC LEVELS: Nondiagnostic axial T2 gradient limiting assessment. Mild canal stenosis C3-4 and C4-5. Nondiagnostic assessment of neural foramen due to motion. Prior CT demonstrated severe RIGHT C6-7 neural foraminal narrowing. IMPRESSION: 1. Motion degraded examination, nondiagnostic axial T2 gradient sequence. 2. No fracture or malalignment. 3. Degenerative change of the cervical spine with mild canal stenosis C3-4 and C4-5. Electronically Signed   By: Elon Alas  M.D.   On: 05/21/2018 03:49    PHYSICAL EXAM Frail elderly Caucasian lady not in distress. . Afebrile. Head is nontraumatic. Neck is supple without bruit.    Cardiac exam no murmur or gallop. Lungs are clear to auscultation. Distal pulses are well felt. Neurological Exam ;  Awake  Alert oriented x 3. Normal speech and language.eye movements full without nystagmus.fundi were not visualized. Vision acuity and  fields appear normal. Hearing is normal. Palatal movements are normal. Face symmetric. Tongue midline. Normal strength, tone, reflexes and coordination. Normal sensation. Gait deferred.   ASSESSMENT/PLAN Ms. BEVELY HACKBART is a 77 y.o. female with history of carotid stenosis, hypertension, hyperlipidemia  presenting with transient difficulty speaking and had weakness in her L leg 1 hr previously.  TIA with transient aphasia in setting of L ICA 80% stenosis  CT head mild to mod SVD PVWM. B TMJ  MRI  No acuate stroke. SVD  CTA head no LVO. atherosclerosis   CTA neck L ICA 80%, R ICA < 50%  MR CS motion degraded, non-dx. No fx, mal alignment. Degenerative changes and mild canal stenosis C3-4 and C4-5  Carotid Doppler  R 1-39%, L 60-79% 2D Echo  Left ventricle: The cavity size was normal. Wall thickness was normal. Systolic function was normal. The estimated ejection fraction was in the range of 60% to 65%. Wall motion was normal; there were no regional wall motion abnormalities  LDL 91  HgbA1c 5.4  Lovenox 30 mg sq daily for VTE prophylaxis  aspirin 81 mg daily prior to admission, now on aspirin 325 mg daily.   Therapy recommendations:  pending   Disposition:  pending   VVS consult - pt of Dr. [name]  current stroke guidelines recommend surgery within 1 week  Hypertensive Urgency  BP 216/85 on arrival  Now [phone] . Home meds: cozaar 50 . BP goal normotensive  Hyperlipidemia  Home meds: Lipitor 40 and Zetia 10 and fish oil, resumed in hospital  LDL 91,  goal < 70  Continue statin at discharge  Other Stroke Risk Factors  Advanced age  ETOH use, advised to drink no more than 1 drink(s) a day  Carotid artery disease known L ICA stenosis. Followed by Dr. [name]. Last seen by him July 2019 with similar blockage, but asymptomatic  Other Active Problems  HOH, wears hearing aids  Memory difficulties, Dr. Joylene Draft monitoring, but not certain she has memory issues  Hallucinations yesterday after receiving ativan 0.5 mg for MRI  Multiple back OR  DOE  Hx [facility] day # 0  Burnetta Sabin, MSN, APRN, ANVP-BC, AGPCNP-BC Advanced Practice Stroke Nurse [place] for Schedule & Pager information 05/21/2018 11:35 AM  I have personally examined this patient, reviewed notes, independently viewed imaging studies, participated in medical decision making and plan of care.ROS completed by me personally and pertinent positives fully documented  I have made any additions or clarifications directly to the above note. Agree with note above. She presented with transient episode of speech difficulties and word finding possibly a left hemispheric TIA. She has moderate proximal left carotid stenosis which is likely symptomatic and she will benefit with early left carotid revascularization. Long discussion with the patient and multiple family members about risk-benefit of carotid surgery and answered questions. Continue ongoing stroke workup. Discussed with Dr. Reesa Chew and Dr. Carlis Abbott from vascular surgery. Greater than 50% time during this 35 minute visit was spent on counseling and coordination of care about TIA, symptomatic carotid stenosis, carotid revascularization and answered questions  Antony Contras, [place] Pager: [phone] 05/21/2018 4:38 PM  To contact Stroke Continuity provider, please refer to [email or web address] After hours, contact General Neurology

## 2018-05-21 NOTE — Evaluation (Signed)
Physical Therapy Evaluation Patient Details Name: Shannon Berger MRN: [id number] DOB: 17-May-1941 Today's Date: 05/21/2018   History of Present Illness  Pt is a 77 y/o female admitted secondary to difficulty speaking and L LE weakness. MRI was negative for any acute abnormalities. PMH including but not limited to hypertension, hyperlipidemia, chronic back pain due to lumbar spine degenerative disc disease, GERD, depression with anxiety, carotid artery disease, CKD3.    Clinical Impression  Pt presented supine in bed with HOB elevated, awake and willing to participate in therapy session. Pt's spouse present throughout session as well. Prior to admission, pt reported that she ambulated with use of a cane and was independent with ADLs. Pt currently at min guard level for bed mobility, transfers and ambulation in hallway with RW. Pt presenting with ataxia and poor coordination in all extremities with purposeful movement, which pt reported has been progressively getting worse. Pt's husband present throughout and discussed safety measures for him to take at home (I.e.- appropriate guarding for safety with mobility). Pt's husband expressed understanding. Pt would continue to benefit from skilled physical therapy services at this time while admitted and after d/c to address the below listed limitations in order to improve overall safety and independence with functional mobility.     Follow Up Recommendations Supervision/Assistance - 24 hour;Other (comment)(pt wanting to participate in aquatic therapy (has script))    Equipment Recommendations  None recommended by PT    Recommendations for Other Services       Precautions / Restrictions Precautions Precautions: Fall Restrictions Weight Bearing Restrictions: No      Mobility  Bed Mobility Overal bed mobility: Needs Assistance Bed Mobility: Supine to Sit     Supine to sit: Min guard     General bed mobility comments: increased time  and effort, min guard for safety  Transfers Overall transfer level: Needs assistance Equipment used: Rolling walker (2 wheeled) Transfers: Sit to/from Stand Sit to Stand: Min guard         General transfer comment: good technique, min guard for safety  Ambulation/Gait Ambulation/Gait assistance: Min guard Gait Distance (Feet): 75 Feet Assistive device: Rolling walker (2 wheeled) Gait Pattern/deviations: Step-through pattern;Decreased step length - right;Decreased step length - left;Decreased stride length;Shuffle;Ataxic Gait velocity: decreased   General Gait Details: pt tremulous throughout entire body (mostly bilateral UEs/LEs); consistent L lean and L drift which pt's husband reports is baseline  Financial trader Rankin (Stroke Patients Only) Modified Rankin (Stroke Patients Only) Pre-Morbid Rankin Score: Moderate disability Modified Rankin: Moderate disability     Balance Overall balance assessment: Needs assistance Sitting-balance support: Feet supported Sitting balance-Leahy Scale: Fair     Standing balance support: Bilateral upper extremity supported Standing balance-Leahy Scale: Poor                               Pertinent Vitals/Pain Pain Assessment: No/denies pain    Home Living Family/patient expects to be discharged to:: Private residence Living Arrangements: Spouse/significant other Available Help at Discharge: Family;Available 24 hours/day Type of Home: House Home Access: Stairs to enter Entrance Stairs-Rails: Psychiatric nurse of Steps: 4 Home Layout: Able to live on main level with bedroom/bathroom Home Equipment: Walker - 2 wheels;Cane - single point;Bedside commode;Shower seat      Prior Function Level of Independence: Independent with assistive device(s)  Comments: pt ambulates with cane at baseline     Hand Dominance        Extremity/Trunk Assessment    Upper Extremity Assessment Upper Extremity Assessment: Defer to OT evaluation;Overall [facility] for tasks assessed    Lower Extremity Assessment Lower Extremity Assessment: Generalized weakness;RLE deficits/detail;LLE deficits/detail RLE Deficits / Details: pt with tremulous type movement with purposeful active movement (not present at rest); MMT revealed 4/5 for hip flexion, knee flexion, ankle DF/PF; 3+/5 for knee flexion RLE Coordination: decreased gross motor;decreased fine motor LLE Deficits / Details: pt with tremulous type movement with purposeful active movement (not present at rest); MMT revealed 4/5 for hip flexion, knee flexion, ankle DF/PF; 3+/5 for knee flexion LLE Coordination: decreased gross motor;decreased fine motor       Communication   Communication: No difficulties  Cognition Arousal/Alertness: Awake/alert Behavior During Therapy: WFL for tasks assessed/performed Overall Cognitive Status: Within Functional Limits for tasks assessed                                        General Comments      Exercises     Assessment/Plan    PT Assessment Patient needs continued PT services  PT Problem List Decreased strength;Decreased balance;Decreased mobility;Decreased coordination;Decreased knowledge of use of DME;Decreased safety awareness;Decreased knowledge of precautions       PT Treatment Interventions Gait training;DME instruction;Stair training;Therapeutic activities;Therapeutic exercise;Balance training;Functional mobility training;Neuromuscular re-education;Patient/family education    PT Goals (Current goals can be found in the Care Plan section)  Acute Rehab PT Goals Patient Stated Goal: return home today PT Goal Formulation: With patient/family Time For Goal Achievement: 06/04/18 Potential to Achieve Goals: Good    Frequency Min 3X/week   Barriers to discharge        Co-evaluation               AM-PAC PT "6 Clicks" Daily Activity   Outcome Measure Difficulty turning over in bed (including adjusting bedclothes, sheets and blankets)?: None Difficulty moving from lying on back to sitting on the side of the bed? : None Difficulty sitting down on and standing up from a chair with arms (e.g., wheelchair, bedside commode, etc,.)?: Unable Help needed moving to and from a bed to chair (including a wheelchair)?: A Little Help needed walking in hospital room?: A Little Help needed climbing 3-5 steps with a railing? : A Little 6 Click Score: 18    End of Session Equipment Utilized During Treatment: Gait belt Activity Tolerance: Patient tolerated treatment well Patient left: in chair;with call bell/phone within reach;with family/visitor present Nurse Communication: Mobility status PT Visit Diagnosis: Other abnormalities of gait and mobility (R26.89);Muscle weakness (generalized) (M62.81)    Time: 0737-1062 PT Time Calculation (min) (ACUTE ONLY): 28 min   Charges:   PT Evaluation $PT Eval Moderate Complexity: 1 Mod PT Treatments $Gait Training: 8-22 mins        Sherie Don, [place], DPT  Acute Rehabilitation Services Pager [phone] Office [place] 05/21/2018, 2:56 PM

## 2018-05-21 NOTE — Progress Notes (Signed)
OT Cancellation Note  Patient Details Name: Shannon Berger MRN: [id number] DOB: 08/24/1940   Cancelled Treatment:     Reason pt not seen/Evaluation not complete: Orders received and chart reviewed for occupational therapy assessment. Pt is currently off the floor testing/ECHO Lab. Attempted x3 this morning. Will check back as schedule permits for evaluation.  Carlynn Herald, [place] Beth Dixon, OTR/L 05/21/2018, 10:26 AM

## 2018-05-21 NOTE — Plan of Care (Signed)
  Problem: Education: Goal: Knowledge of General Education information will improve Description Including pain rating scale, medication(s)/side effects and non-pharmacologic comfort measures Outcome: Completed/Met   Problem: Health Behavior/Discharge Planning: Goal: Ability to manage health-related needs will improve Outcome: Completed/Met   Problem: Clinical Measurements: Goal: Ability to maintain clinical measurements within normal limits will improve Outcome: Completed/Met Goal: Will remain free from infection Outcome: Completed/Met Goal: Diagnostic test results will improve Outcome: Completed/Met Goal: Respiratory complications will improve Outcome: Completed/Met

## 2018-05-21 NOTE — Progress Notes (Signed)
Patient is for discharge home today.  IV and telemetry dsicontinued.  AVS discussed.  Patient is comfortable ,  No complaint,  Upon discharge.

## 2018-05-21 NOTE — Consult Note (Signed)
Neurology Consultation Reason for Consult: TIA Referring Physician: Mora Bellman  CC: Difficulty speaking  History is obtained from: Patient  HPI: Shannon Berger is a 77 y.o. right-handed female with a history of carotid stenosis, hypertension, hyperlipidemia who presents with transient difficulty speaking which occurred earlier while talking on the phone.  She states that she suddenly would not be able to finish a sentence after starting it.  She also would have trouble with any slightly complex word even getting the first word out.  She denies any come prehension difficulty.  When she walked, she had more difficulty than she typically does, which she attributes to her chronic left leg issues.   Also of note, she has had progressive tremor over the past couple of years.  She also has had progressive difficulty walking which is been worsening.  LKW: 4:30 PM tpa given?: no, resolution of symptoms    ROS: A 14 point ROS was performed and is negative except as noted in the HPI.   Past Medical History:  Diagnosis Date  . Arthritis   . Carotid arterial disease ([place])   . Constipation   . Degeneration of lumbar intervertebral disc   . Dislocation of hip joint prosthesis ([place])   . Fusion of spine of lumbar region   . Hyperlipidemia   . Hypertension   . Low back pain    Radiating to left leg  . Osteoarthritis   . Osteopenia   . Sleep difficulties   . Spinal stenosis at L4-L5 level      Family History  Problem Relation Age of Onset  . Heart failure Mother   . Cancer Mother        colon  . Hypertension Mother   . Other Mother        Varicose Veins  . Diabetes Father   . Heart disease Father   . Hypertension Father   . Heart attack Father   . Cancer Brother        squamos cell - head, neck, ear     Social History:  reports that she has never smoked. She has never used smokeless tobacco. She reports that she drinks alcohol. She reports that she does not use  drugs.   Exam: Current vital signs: BP (!) 181/68 (BP Location: Right Arm)   Pulse 67   Temp 98.2 F (36.8 C) (Oral)   Resp 18   Ht 5' 0.5" (1.537 m)   Wt 54.7 kg   SpO2 100%   BMI 23.16 kg/m  Vital signs in last 24 hours: Temp:  [98.2 F (36.8 C)-98.3 F (36.8 C)] 98.2 F (36.8 C) (11/13 2233) Pulse Rate:  [67-74] 67 (11/13 2233) Resp:  [16-21] 18 (11/13 2233) BP: (181-216)/(68-85) 181/68 (11/13 2233) SpO2:  [100 %] 100 % (11/13 2233) Weight:  [51.7 kg-54.7 kg] 54.7 kg (11/13 2233)   Physical Exam  Constitutional: Appears well-developed and well-nourished.  Psych: Affect appropriate to situation Eyes: No scleral injection HENT: No OP obstrucion Head: Normocephalic.  Cardiovascular: Normal rate and regular rhythm.  Respiratory: Effort normal, non-labored breathing GI: Soft.  No distension. There is no tenderness.  Skin: WDI  Neuro: Mental Status: Patient is awake, alert, oriented to person, place, month, year, and situation. Patient is able to give a clear and coherent history. No signs of aphasia or neglect Cranial Nerves: II: Visual Fields are full. Pupils are equal, round, and reactive to light.   III,IV, VI: EOMI without ptosis or diploplia.  V: Facial  sensation is symmetric to temperature VII: Facial movement with mild left facial weakness(old per drivers license photo) VIII: hearing is intact to voice X: Uvula elevates symmetrically XI: Shoulder shrug is symmetric. XII: tongue is midline without atrophy or fasciculations.  Motor: Tone is normal. Bulk is normal. 5/5 strength was present in all four extremities.  DTR: 3+ and symmetric including pectoral reflexes. Non-sustained  Sensory: Sensation is symmetric to light touch and temperature in the arms and legs. Cerebellar: She has a postural and intentional   I have reviewed labs in epic and the results pertinent to this consultation are: CMP- creatinine 1.26  I have reviewed the images obtained: MRI  brain-negative  Impression: 77 year old female with what sounds very much like a transient expressive aphasia.  Given the acute onset and timeframe of resolution, I think that transient ischemic attack is the most likely etiology and I would favor treating it as such.  Also, though unrelated to her current symptoms, her progressive gait dysfunction with hyperreflexia does raise the possibility of cervical spine disease and therefore I would recommend an MRI of her cervical spine as well.  If she does have significant carotid stenosis, may need vascular surgery evaluation as well.  Recommendations: - HgbA1c, fasting lipid panel -CTA head and neck - Frequent neuro checks - Echocardiogram - Prophylactic therapy-Antiplatelet med: Aspirin - dose 325mg  PO or 300mg  PR - Risk factor modification - Telemetry monitoring - PT consult, OT consult, Speech consult - MRI cervical spine - Stroke team to follow    Roland Rack, MD [facility] [phone]  If 7pm- 7am, please page neurology on call as listed in [place].

## 2018-05-21 NOTE — Care Management Obs Status (Signed)
[place] NOTIFICATION   Patient Details  Name: SHAKYLA NOLLEY MRN: [id number] Date of Birth: [birth year]   Medicare Observation Status Notification Given:       Pollie Friar, RN 05/21/2018, 4:33 PM

## 2018-05-21 NOTE — Discharge Summary (Signed)
Physician Discharge Summary  Shannon Berger QIO:[id number] DOB: 1940/10/02 DOA: 05/20/2018  PCP: [name], MD  Admit date: 05/20/2018 Discharge date: 05/21/2018  Admitted From: Home Disposition: Home  Recommendations for Outpatient Follow-up:  1. Follow up with PCP in 1-2 weeks 2. Please obtain BMP/CBC in one week your next doctors visit.  3. Aspirin changed to 25 mg daily 4. Follow-up outpatient with Dr. Doren Custard from vascular surgery on 05/26/2018 for left-sided CEA outpatient.   Discharge Condition: Stable CODE STATUS: Partial Diet recommendation: Cardiac  Brief/Interim Summary: 77 year old with history of hypertension, hyperlipidemia, chronic back pain, CKD stage III came to the hospital with complains of a aphasia and left-sided weakness.  Patient CT of the head was negative for any acute pathology and MRI was also negative.  But CTA of the head and neck showed left ICA 80% stenosis in right ICA less than 50% stenosis.  Hemoglobin A1c was 5.4, LDL was 91.  Given the extent of coronary disease, vascular surgery was consulted.  Recommended outpatient left-sided carotid CEA within 1 week, patient was scheduled for surgery for next week on 05/26/2018 with Dr. Doren Custard.  Patient was seen by neurology in the hospital as well.  Recommended only aspirin turn 25 mg daily for now, no Plavix until her surgery and then it can be determined later.  At this time patient has reached maximum benefit from an hospital stay and stable to be discharged with outpatient follow-up recommendations as stated above.   Discharge Diagnoses:  Principal Problem:   TIA (transient ischemic attack) Active Problems:   Hypertension   Carotid arterial disease (HCC)   Hyperlipidemia   Anxiety   Depression   GERD (gastroesophageal reflux disease)   CKD (chronic kidney disease), stage III (HCC)   Anemia   Degenerative disc disease, cervical  Transient aphasia concerning for TIA, resolved Left-sided  carotid artery stenosis 80%  -Although patient has CT of the head negative and MRI of the brain negative for acute stroke CTA neck is concerning for internal carotid artery stenosis on the left side of 80% which is symptomatic.  Vascular surgery consulted, plans for outpatient CEA next week.  Patient has been scheduled by their service. -Appreciate neurology input-recommending aspirin 325 mg orally daily  Essential hypertension -Resume her home medications including Cozaar.  Hyperlipidemia -LDL is 91.  Resume home Lipitor and Zetia  GERD -PPI  Chronic kidney disease stage III -Renal function is at baseline.  Resume home medications  Patient was on Lovenox for DVT prophylaxis while here She is a partial code, no intubation but okay for CPR Discharge today with outpatient follow-up with vascular surgery next week.  Discharge Instructions   Allergies as of 05/21/2018      Reactions   Oxycodone-acetaminophen Anaphylaxis   Celebrex [celecoxib]    Pt does not remember if this is truly an allergy. Hallucinations   Hydrocodone    Hydrocodone-acetaminophen Nausea And Vomiting   Oxycodone       Medication List    TAKE these medications   ALPRAZolam 0.25 MG tablet Commonly known as:  XANAX Take 0.125-0.25 mg by mouth at bedtime as needed for sleep.   aspirin 325 MG EC tablet Take 1 tablet (325 mg total) by mouth daily. Start taking on:  05/22/2018 What changed:    medication strength  how much to take  when to take this   atorvastatin 40 MG tablet Commonly known as:  LIPITOR Take 40 mg by mouth daily.   bisoprolol 5 MG tablet  Commonly known as:  ZEBETA Take 2 tablets (10 mg total) by mouth daily.   buPROPion 300 MG 24 hr tablet Commonly known as:  WELLBUTRIN XL Take 300 mg by mouth daily.   CALCIUM PO Take 1,200 mg by mouth daily.   ezetimibe 10 MG tablet Commonly known as:  ZETIA Take 10 mg by mouth daily.   FISH OIL PO Take 1,400 mg by mouth 2 (two) times  daily.   ibandronate 150 MG tablet Commonly known as:  BONIVA Take 150 mg by mouth every 30 (thirty) days.   losartan 50 MG tablet Commonly known as:  COZAAR Take 50 mg by mouth daily.   meloxicam 15 MG tablet Commonly known as:  MOBIC Take 15 mg by mouth as needed for pain.   multivitamin tablet Take 1 tablet by mouth daily.   omeprazole 20 MG capsule Commonly known as:  PRILOSEC Take 20 mg by mouth daily.   senna 8.6 MG Tabs tablet Commonly known as:  SENOKOT Take 1 tablet by mouth at bedtime as needed for mild constipation.   venlafaxine 25 MG tablet Commonly known as:  EFFEXOR Take 75 mg by mouth daily.   VITAMIN C PO Take 1,000 mg by mouth 2 (two) times daily.   Vitamin D 50 MCG (2000 UT) tablet Take 2,000 Units by mouth as directed.      Follow-up Information    [name], MD. Schedule an appointment as soon as possible for a visit in 1 week(s).   Specialty:  Internal Medicine Contact information: [place] [address] [phone]        Angelia Mould, MD. Go on 05/26/2018.   Specialties:  Vascular Surgery, Cardiology Why:  Left CEA Contact information: [address] [place] [address] [phone]          Allergies  Allergen Reactions  . Oxycodone-Acetaminophen Anaphylaxis  . Celebrex [Celecoxib]     Pt does not remember if this is truly an allergy. Hallucinations  . Hydrocodone   . Hydrocodone-Acetaminophen Nausea And Vomiting  . Oxycodone     You were cared for by a hospitalist during your hospital stay. If you have any questions about your discharge medications or the care you received while you were in the hospital after you are discharged, you can call the unit and asked to speak with the hospitalist on call if the hospitalist that took care of you is not available. Once you are discharged, your primary care physician will handle any further medical issues. Please note that no refills for any discharge  medications will be authorized once you are discharged, as it is imperative that you return to your primary care physician (or establish a relationship with a primary care physician if you do not have one) for your aftercare needs so that they can reassess your need for medications and monitor your lab values.  Consultations:  Vascular surgery  Neurology   Procedures/Studies: Ct Angio Head W Or Wo Contrast  Result Date: 05/21/2018 CLINICAL DATA:  Follow up code stroke. History of carotid artery disease, hypertension, hyperlipidemia. EXAM: CT ANGIOGRAPHY HEAD AND NECK TECHNIQUE: Multidetector CT imaging of the head and neck was performed using the standard protocol during bolus administration of intravenous contrast. Multiplanar CT image reconstructions and MIPs were obtained to evaluate the vascular anatomy. Carotid stenosis measurements (when applicable) are obtained utilizing NASCET criteria, using the distal internal carotid diameter as the denominator. CONTRAST:  3mL ISOVUE-370 IOPAMIDOL (ISOVUE-370) INJECTION 76% COMPARISON:  MRI  of the head May 20, 2018 FINDINGS: CTA NECK FINDINGS: AORTIC ARCH: Normal appearance of the thoracic arch, normal branch pattern. Mild calcific atherosclerosis. The origins of the innominate, left Common carotid artery and subclavian artery are widely patent. RIGHT CAROTID SYSTEM: Common carotid artery is patent. Calcific atherosclerosis resulting in less than 50% stenosis RIGHT ICA. LEFT CAROTID SYSTEM: Common carotid artery is patent. Calcific atherosclerosis resulting in 1 cm segment of 80% stenosis by NASCET criteria. Mild poststenotic dilatation. VERTEBRAL ARTERIES:Codominant vertebral arteries. Normal appearance of the vertebral arteries, widely patent. SKELETON: No acute osseous process though bone windows have not been submitted. OTHER NECK: Soft tissues of the neck are nonacute though, not tailored for evaluation. UPPER CHEST: Mild biapical pleural  thickening. No superior mediastinal lymphadenopathy. CTA HEAD FINDINGS: ANTERIOR CIRCULATION: Patent cervical internal carotid arteries, petrous, cavernous and supra clinoid internal carotid arteries. Patent anterior communicating artery. Patent anterior and middle cerebral arteries, mild luminal irregularity compatible with atherosclerosis. No large vessel occlusion, significant stenosis, contrast extravasation or aneurysm. POSTERIOR CIRCULATION: Patent vertebral arteries, vertebrobasilar junction and basilar artery, as well as main branch vessels. Patent posterior cerebral arteries, mild luminal irregularity compatible with atherosclerosis. No large vessel occlusion, significant stenosis, contrast extravasation or aneurysm. VENOUS SINUSES: Major dural venous sinuses are patent though not tailored for evaluation on this angiographic examination. ANATOMIC VARIANTS: None. DELAYED PHASE: No abnormal intracranial enhancement. MIP images reviewed. IMPRESSION: CTA NECK: 1. Hemodynamically significant 80% stenosis LEFT ICA. Less than 50% stenosis RIGHT ICA. 2. Patent bilateral vertebral arteries. CTA HEAD: 1. No emergent large vessel occlusion or flow-limiting stenosis. 2. Mild intracranial atherosclerosis. Aortic Atherosclerosis (ICD10-I70.0). Electronically Signed   By: Elon Alas M.D.   On: 05/21/2018 00:57   Ct Head Wo Contrast  Result Date: 05/20/2018 CLINICAL DATA:  Episodic left-sided weakness and aphasia this afternoon lasting 15-20 minutes now resolved. EXAM: CT HEAD WITHOUT CONTRAST TECHNIQUE: Contiguous axial images were obtained from the base of the skull through the vertex without intravenous contrast. COMPARISON:  None. FINDINGS: Brain: Mild-to-moderate small vessel ischemic disease of periventricular white matter with age related involutional changes of the brain. No intra-axial mass, extra-axial fluid collection, midline shift or edema. No hydrocephalus. Midline fourth ventricle and basal  cisterns without effacement. Vascular: Atherosclerosis of the carotid siphons. No hyperdense vessels. Skull: Intact Sinuses/Orbits: Bilateral lens replacements. Clear paranasal sinuses and mastoids. Osteoarthritis of the temporomandibular joints with subchondral cystic change of the condylar heads of the mandible. Other: None IMPRESSION: 1. Mild-to-moderate small vessel ischemic disease of periventricular white matter. No acute intracranial abnormality. 2. Bilateral TMJ osteoarthritis. Electronically Signed   By: Ashley Royalty M.D.   On: 05/20/2018 20:04   Ct Angio Neck W Or Wo Contrast  Result Date: 05/21/2018 CLINICAL DATA:  Follow up code stroke. History of carotid artery disease, hypertension, hyperlipidemia. EXAM: CT ANGIOGRAPHY HEAD AND NECK TECHNIQUE: Multidetector CT imaging of the head and neck was performed using the standard protocol during bolus administration of intravenous contrast. Multiplanar CT image reconstructions and MIPs were obtained to evaluate the vascular anatomy. Carotid stenosis measurements (when applicable) are obtained utilizing NASCET criteria, using the distal internal carotid diameter as the denominator. CONTRAST:  65mL ISOVUE-370 IOPAMIDOL (ISOVUE-370) INJECTION 76% COMPARISON:  MRI of the head May 20, 2018 FINDINGS: CTA NECK FINDINGS: AORTIC ARCH: Normal appearance of the thoracic arch, normal branch pattern. Mild calcific atherosclerosis. The origins of the innominate, left Common carotid artery and subclavian artery are widely patent. RIGHT CAROTID SYSTEM: Common carotid artery is patent. Calcific atherosclerosis  resulting in less than 50% stenosis RIGHT ICA. LEFT CAROTID SYSTEM: Common carotid artery is patent. Calcific atherosclerosis resulting in 1 cm segment of 80% stenosis by NASCET criteria. Mild poststenotic dilatation. VERTEBRAL ARTERIES:Codominant vertebral arteries. Normal appearance of the vertebral arteries, widely patent. SKELETON: No acute osseous process  though bone windows have not been submitted. OTHER NECK: Soft tissues of the neck are nonacute though, not tailored for evaluation. UPPER CHEST: Mild biapical pleural thickening. No superior mediastinal lymphadenopathy. CTA HEAD FINDINGS: ANTERIOR CIRCULATION: Patent cervical internal carotid arteries, petrous, cavernous and supra clinoid internal carotid arteries. Patent anterior communicating artery. Patent anterior and middle cerebral arteries, mild luminal irregularity compatible with atherosclerosis. No large vessel occlusion, significant stenosis, contrast extravasation or aneurysm. POSTERIOR CIRCULATION: Patent vertebral arteries, vertebrobasilar junction and basilar artery, as well as main branch vessels. Patent posterior cerebral arteries, mild luminal irregularity compatible with atherosclerosis. No large vessel occlusion, significant stenosis, contrast extravasation or aneurysm. VENOUS SINUSES: Major dural venous sinuses are patent though not tailored for evaluation on this angiographic examination. ANATOMIC VARIANTS: None. DELAYED PHASE: No abnormal intracranial enhancement. MIP images reviewed. IMPRESSION: CTA NECK: 1. Hemodynamically significant 80% stenosis LEFT ICA. Less than 50% stenosis RIGHT ICA. 2. Patent bilateral vertebral arteries. CTA HEAD: 1. No emergent large vessel occlusion or flow-limiting stenosis. 2. Mild intracranial atherosclerosis. Aortic Atherosclerosis (ICD10-I70.0). Electronically Signed   By: Elon Alas M.D.   On: 05/21/2018 00:57   Mr Brain Wo Contrast  Result Date: 05/20/2018 CLINICAL DATA:  Left-sided weakness and aphasia EXAM: MRI HEAD WITHOUT CONTRAST TECHNIQUE: Multiplanar, multiecho pulse sequences of the brain and surrounding structures were obtained without intravenous contrast. COMPARISON:  None. FINDINGS: BRAIN: There is no acute infarct, acute hemorrhage, hydrocephalus or extra-axial collection. The midline structures are normal. No midline shift or other  mass effect. There are no old infarcts. Multifocal white matter hyperintensity, most commonly due to chronic ischemic microangiopathy. The cerebral and cerebellar volume are age-appropriate. Susceptibility-sensitive sequences show no chronic microhemorrhage or superficial siderosis. VASCULAR: Major intracranial arterial and venous sinus flow voids are normal. SKULL AND UPPER CERVICAL SPINE: Calvarial bone marrow signal is normal. There is no skull base mass. Visualized upper cervical spine and soft tissues are normal. SINUSES/ORBITS: No fluid levels or advanced mucosal thickening. No mastoid or middle ear effusion. The orbits are normal. IMPRESSION: 1. No acute abnormality. 2. Chronic small vessel ischemia. Electronically Signed   By: Ulyses Jarred M.D.   On: 05/20/2018 21:20   Mr Cervical Spine Wo Contrast  Result Date: 05/21/2018 CLINICAL DATA:  LEFT-sided weakness, now resolved. Gait imbalance and baseline tremor. EXAM: MRI CERVICAL SPINE WITHOUT CONTRAST TECHNIQUE: Multiplanar, multisequence MR imaging of the cervical spine was performed. No intravenous contrast was administered. COMPARISON:  CT angiogram head and neck May 21, 2018 FINDINGS: Motion degraded examination: Moderate motion on sagittal sequences, severely motion degraded axial sequences, nondiagnostic axial T2 gradient. ALIGNMENT: Maintained cervical lordosis.  No malalignment. VERTEBRAE/DISCS: Vertebral bodies are intact. Moderate C3-4, C4-5, C5-6 and moderate to severe C6-7 disc height loss with diffuse disc desiccation. Moderate acute on chronic discogenic endplate changes W7-3. Mild chronic discogenic endplate changes X1-0 through C5-6. Bilateral C8 1 cm perineural cysts. Heterogeneous bone marrow signal most compatible with osteopenia. CORD:Cervical spinal cord is normal morphology and signal characteristics from the cervicomedullary junction to level of T3-4, the most caudal well-visualized level to motion degrades sensitivity for  subtle potential signal abnormality. No syrinx. POSTERIOR FOSSA, VERTEBRAL ARTERIES, PARASPINAL TISSUES: No MR findings of ligamentous injury. Vertebral artery  flow voids present. Included posterior fossa and paraspinal soft tissues are normal. DISC LEVELS: Nondiagnostic axial T2 gradient limiting assessment. Mild canal stenosis C3-4 and C4-5. Nondiagnostic assessment of neural foramen due to motion. Prior CT demonstrated severe RIGHT C6-7 neural foraminal narrowing. IMPRESSION: 1. Motion degraded examination, nondiagnostic axial T2 gradient sequence. 2. No fracture or malalignment. 3. Degenerative change of the cervical spine with mild canal stenosis C3-4 and C4-5. Electronically Signed   By: Elon Alas M.D.   On: 05/21/2018 03:49      Subjective:No complaints, feels back to her baseline.   General = no fevers, chills, dizziness, malaise, fatigue HEENT/EYES = negative for pain, redness, loss of vision, double vision, blurred vision, loss of hearing, sore throat, hoarseness, dysphagia Cardiovascular= negative for chest pain, palpitation, murmurs, lower extremity swelling Respiratory/lungs= negative for shortness of breath, cough, hemoptysis, wheezing, mucus production Gastrointestinal= negative for nausea, vomiting,, abdominal pain, melena, hematemesis Genitourinary= negative for Dysuria, Hematuria, Change in Urinary Frequency MSK = Negative for arthralgia, myalgias, Back Pain, Joint swelling  Neurology= Negative for headache, seizures, numbness, tingling  Psychiatry= Negative for anxiety, depression, suicidal and homocidal ideation Allergy/Immunology= Medication/Food allergy as listed  Skin= Negative for Rash, lesions, ulcers, itching    Discharge Exam: Vitals:   05/21/18 1202 05/21/18 1601  BP: (!) 161/78 124/77  Pulse: 70 69  Resp: 19 19  Temp: 97.8 F (36.6 C) 97.9 F (36.6 C)  SpO2: 100% 100%   Vitals:   05/21/18 0501 05/21/18 0949 05/21/18 1202 05/21/18 1601  BP: (!)  143/66 (!) 158/67 (!) 161/78 124/77  Pulse: 66 67 70 69  Resp: 18 20 19 19   Temp: 98.3 F (36.8 C) 97.7 F (36.5 C) 97.8 F (36.6 C) 97.9 F (36.6 C)  TempSrc: Oral Oral Oral Oral  SpO2: 98% 100% 100% 100%  Weight:      Height:        General: Pt is alert, awake, not in acute distress Cardiovascular: RRR, S1/S2 +, no rubs, no gallops Respiratory: CTA bilaterally, no wheezing, no rhonchi Abdominal: Soft, NT, ND, bowel sounds + Extremities: no edema, no cyanosis    The results of significant diagnostics from this hospitalization (including imaging, microbiology, ancillary and laboratory) are listed below for reference.     Microbiology: No results found for this or any previous visit (from the past 240 hour(s)).   Labs: BNP (last 3 results) Recent Labs    05/20/18 2042  BNP 094.7*   Basic Metabolic Panel: Recent Labs  Lab 05/20/18 1833 05/20/18 1849  NA 139 143  K 3.7 3.6  CL 105 106  CO2 23  --   GLUCOSE 105* 97  BUN 21 21  CREATININE 1.26* 1.20*  CALCIUM 9.5  --    Liver Function Tests: Recent Labs  Lab 05/20/18 1833  AST 36  ALT 27  ALKPHOS 123  BILITOT 0.7  PROT 6.2*  ALBUMIN 3.5   No results for input(s): LIPASE, AMYLASE in the last 168 hours. No results for input(s): AMMONIA in the last 168 hours. CBC: Recent Labs  Lab 05/20/18 1833 05/20/18 1849  WBC 5.4  --   NEUTROABS 3.9  --   HGB 10.4* 9.9*  HCT 31.8* 29.0*  MCV 100.0  --   PLT 279  --    Cardiac Enzymes: No results for input(s): CKTOTAL, CKMB, CKMBINDEX, TROPONINI in the last 168 hours. BNP: Invalid input(s): POCBNP CBG: No results for input(s): GLUCAP in the last 168 hours. D-Dimer No results for input(s):  DDIMER in the last 72 hours. Hgb A1c Recent Labs    05/21/18 0412  HGBA1C 5.4   Lipid Profile Recent Labs    05/21/18 0412  CHOL 174  HDL 71  LDLCALC 91  TRIG 59  CHOLHDL 2.5   Thyroid function studies No results for input(s): TSH, T4TOTAL, T3FREE,  THYROIDAB in the last 72 hours.  Invalid input(s): FREET3 Anemia work up No results for input(s): VITAMINB12, FOLATE, FERRITIN, TIBC, IRON, RETICCTPCT in the last 72 hours. Urinalysis    Component Value Date/Time   COLORURINE YELLOW 05/20/2018 1831   APPEARANCEUR CLEAR 05/20/2018 1831   LABSPEC 1.006 05/20/2018 1831   PHURINE 7.0 05/20/2018 1831   GLUCOSEU NEGATIVE 05/20/2018 1831   HGBUR NEGATIVE 05/20/2018 1831   BILIRUBINUR NEGATIVE 05/20/2018 1831   KETONESUR NEGATIVE 05/20/2018 1831   PROTEINUR NEGATIVE 05/20/2018 1831   UROBILINOGEN 0.2 08/21/2012 1426   NITRITE NEGATIVE 05/20/2018 1831   LEUKOCYTESUR NEGATIVE 05/20/2018 1831   Sepsis Labs Invalid input(s): PROCALCITONIN,  WBC,  LACTICIDVEN Microbiology No results found for this or any previous visit (from the past 240 hour(s)).   Time coordinating discharge:  I have spent 35 minutes face to face with the patient and on the ward discussing the patients care, assessment, plan and disposition with other care givers. >50% of the time was devoted counseling the patient about the risks and benefits of treatment/Discharge disposition and coordinating care.   SIGNED:   Damita Lack, MD  [facility] 05/21/2018, 4:24 PM Pager   If 7PM-7AM, please contact night-coverage [email or web address] Password [email or web address]

## 2018-05-21 NOTE — Progress Notes (Signed)
[facility] PROGRESS NOTE  Shannon Berger JYN:[id number] DOB: Mar 31, 1941 DOA: 05/20/2018 PCP: [name], MD  Assessment/Plan:  TIA (transient ischemic attack): hx carotid stenosis, htn, hyperlipidemia with transient difficulty speaking and worsening gait.  negative CT head. CTA head and neck with hemodynamically significant 80% stenosis left ica. Less than 50% stenosis right ica. Patent bilateral vertebral arteries and no emergent large vessel occlusion or flow limiting stenosis. Speech clear this am. No events on tele. Evaluated by dr Tobias Alexander neuro who recommend tia workup and MRI of cervical spine for completeness of evaluation of gait. Echo results pending, lipid panel and A1c within limits of normal UDS + benzos. PT consult pending -follow neuro recommendations -follow PT recommendations -continue homemeds -have requested vascular consult  HTN: BP high end of normal. Home meds include losartan and bisoprolol.  - continue to hold home bp meds to allow permissive hypertension -IV hydralazine prn for SBP>220 and dBP> 120 -monitor  Carotid arterial disease ([place]): see #1. Last saw vascular 01/2018 and note indicates carotid duplex revealed less than 39% stenosis on right which is improved from 6 months prior and stable on left with 60-79% stenosis -On aspirin, Lipitor, Zetia -vascular consult requested in light of symptom  Hyperlipidemia: lipid panel within limits of normal -lipitor and Zetia  Depression and anxiety: no suicidal or homicidal ideations. -Continue home medications: PRN Xanax and Wellbutrin  GERD: -Protonix  CKD (chronic kidney disease), stage III ([place]): slightly worse. Baseline creatinine 0.8 on 07/12/2016. She had cre1.56 on 05/13/2018 which was likely acute change.  Likely due to dehydration and continuation of Mobic and Cozaar. Of note scheduled for OP cardiac CT last week which was cancelled due to elevated creatinine. At that time home HCTZ  stopped. Creatinine is trending down since then -IVF: NS 75 cc/h -hold Cozaar and Mobic  Macrocytic anemia: slight drop. HGb 9.9<--10.4 on 05/20/2018.  MCV 100.0. -Follow-up with CBC    Code Status: full Family Communication: husband at bedside Disposition Plan: home when ready likely tomorrow   Consultants:  Dr Carlis Abbott vascular  Dr kirpatrick neuro  Procedures:  echo  Antibiotics: HPI/Subjective: She reports feeling better and having slept well. Husband who sat at bedside reports she was up most of night "hallucinating" and talking out loud to people not in room  Admitted with transient aphasia. tia work up vascular consult for known carotid disease in setting of symptoms  Objective: Vitals:   05/21/18 0501 05/21/18 0949  BP: (!) 143/66 (!) 158/67  Pulse: 66 67  Resp: 18 20  Temp: 98.3 F (36.8 C) 97.7 F (36.5 C)  SpO2: 98% 100%   No intake or output data in the 24 hours ending 05/21/18 1154 Filed Weights   05/20/18 1818 05/20/18 2233  Weight: 51.7 kg 54.7 kg    Exam:   General:  Awake alert well nourished in no acute distress  Cardiovascular: rrr no mgr no le edema  Respiratory: normal effort BS clear bilaterally no wheezes  Abdomen: flat soft +BS no guarding or rebounding  Musculoskeletal: joints without swelling/erythema  Neuro: speech clear no facial droop, bilateral grip 5/5 le strength 4/5 on left 5/5 on right   Data Reviewed: Basic Metabolic Panel: Recent Labs  Lab 05/20/18 1833 05/20/18 1849  NA 139 143  K 3.7 3.6  CL 105 106  CO2 23  --   GLUCOSE 105* 97  BUN 21 21  CREATININE 1.26* 1.20*  CALCIUM 9.5  --    Liver Function Tests: Recent Labs  Lab 05/20/18 1833  AST 36  ALT 27  ALKPHOS 123  BILITOT 0.7  PROT 6.2*  ALBUMIN 3.5   No results for input(s): LIPASE, AMYLASE in the last 168 hours. No results for input(s): AMMONIA in the last 168 hours. CBC: Recent Labs  Lab 05/20/18 1833 05/20/18 1849  WBC 5.4  --    NEUTROABS 3.9  --   HGB 10.4* 9.9*  HCT 31.8* 29.0*  MCV 100.0  --   PLT 279  --    Cardiac Enzymes: No results for input(s): CKTOTAL, CKMB, CKMBINDEX, TROPONINI in the last 168 hours. BNP (last 3 results) Recent Labs    05/20/18 2042  BNP 532.1*    ProBNP (last 3 results) No results for input(s): PROBNP in the last 8760 hours.  CBG: No results for input(s): GLUCAP in the last 168 hours.  No results found for this or any previous visit (from the past 240 hour(s)).   Studies: Ct Angio Head W Or Wo Contrast  Result Date: 05/21/2018 CLINICAL DATA:  Follow up code stroke. History of carotid artery disease, hypertension, hyperlipidemia. EXAM: CT ANGIOGRAPHY HEAD AND NECK TECHNIQUE: Multidetector CT imaging of the head and neck was performed using the standard protocol during bolus administration of intravenous contrast. Multiplanar CT image reconstructions and MIPs were obtained to evaluate the vascular anatomy. Carotid stenosis measurements (when applicable) are obtained utilizing NASCET criteria, using the distal internal carotid diameter as the denominator. CONTRAST:  19mL ISOVUE-370 IOPAMIDOL (ISOVUE-370) INJECTION 76% COMPARISON:  MRI of the head May 20, 2018 FINDINGS: CTA NECK FINDINGS: AORTIC ARCH: Normal appearance of the thoracic arch, normal branch pattern. Mild calcific atherosclerosis. The origins of the innominate, left Common carotid artery and subclavian artery are widely patent. RIGHT CAROTID SYSTEM: Common carotid artery is patent. Calcific atherosclerosis resulting in less than 50% stenosis RIGHT ICA. LEFT CAROTID SYSTEM: Common carotid artery is patent. Calcific atherosclerosis resulting in 1 cm segment of 80% stenosis by NASCET criteria. Mild poststenotic dilatation. VERTEBRAL ARTERIES:Codominant vertebral arteries. Normal appearance of the vertebral arteries, widely patent. SKELETON: No acute osseous process though bone windows have not been submitted. OTHER NECK:  Soft tissues of the neck are nonacute though, not tailored for evaluation. UPPER CHEST: Mild biapical pleural thickening. No superior mediastinal lymphadenopathy. CTA HEAD FINDINGS: ANTERIOR CIRCULATION: Patent cervical internal carotid arteries, petrous, cavernous and supra clinoid internal carotid arteries. Patent anterior communicating artery. Patent anterior and middle cerebral arteries, mild luminal irregularity compatible with atherosclerosis. No large vessel occlusion, significant stenosis, contrast extravasation or aneurysm. POSTERIOR CIRCULATION: Patent vertebral arteries, vertebrobasilar junction and basilar artery, as well as main branch vessels. Patent posterior cerebral arteries, mild luminal irregularity compatible with atherosclerosis. No large vessel occlusion, significant stenosis, contrast extravasation or aneurysm. VENOUS SINUSES: Major dural venous sinuses are patent though not tailored for evaluation on this angiographic examination. ANATOMIC VARIANTS: None. DELAYED PHASE: No abnormal intracranial enhancement. MIP images reviewed. IMPRESSION: CTA NECK: 1. Hemodynamically significant 80% stenosis LEFT ICA. Less than 50% stenosis RIGHT ICA. 2. Patent bilateral vertebral arteries. CTA HEAD: 1. No emergent large vessel occlusion or flow-limiting stenosis. 2. Mild intracranial atherosclerosis. Aortic Atherosclerosis (ICD10-I70.0). Electronically Signed   By: Elon Alas M.D.   On: 05/21/2018 00:57   Ct Head Wo Contrast  Result Date: 05/20/2018 CLINICAL DATA:  Episodic left-sided weakness and aphasia this afternoon lasting 15-20 minutes now resolved. EXAM: CT HEAD WITHOUT CONTRAST TECHNIQUE: Contiguous axial images were obtained from the base of the skull through the vertex without intravenous contrast. COMPARISON:  None. FINDINGS: Brain: Mild-to-moderate small vessel ischemic disease of periventricular white matter with age related involutional changes of the brain. No intra-axial mass,  extra-axial fluid collection, midline shift or edema. No hydrocephalus. Midline fourth ventricle and basal cisterns without effacement. Vascular: Atherosclerosis of the carotid siphons. No hyperdense vessels. Skull: Intact Sinuses/Orbits: Bilateral lens replacements. Clear paranasal sinuses and mastoids. Osteoarthritis of the temporomandibular joints with subchondral cystic change of the condylar heads of the mandible. Other: None IMPRESSION: 1. Mild-to-moderate small vessel ischemic disease of periventricular white matter. No acute intracranial abnormality. 2. Bilateral TMJ osteoarthritis. Electronically Signed   By: Ashley Royalty M.D.   On: 05/20/2018 20:04   Ct Angio Neck W Or Wo Contrast  Result Date: 05/21/2018 CLINICAL DATA:  Follow up code stroke. History of carotid artery disease, hypertension, hyperlipidemia. EXAM: CT ANGIOGRAPHY HEAD AND NECK TECHNIQUE: Multidetector CT imaging of the head and neck was performed using the standard protocol during bolus administration of intravenous contrast. Multiplanar CT image reconstructions and MIPs were obtained to evaluate the vascular anatomy. Carotid stenosis measurements (when applicable) are obtained utilizing NASCET criteria, using the distal internal carotid diameter as the denominator. CONTRAST:  65mL ISOVUE-370 IOPAMIDOL (ISOVUE-370) INJECTION 76% COMPARISON:  MRI of the head May 20, 2018 FINDINGS: CTA NECK FINDINGS: AORTIC ARCH: Normal appearance of the thoracic arch, normal branch pattern. Mild calcific atherosclerosis. The origins of the innominate, left Common carotid artery and subclavian artery are widely patent. RIGHT CAROTID SYSTEM: Common carotid artery is patent. Calcific atherosclerosis resulting in less than 50% stenosis RIGHT ICA. LEFT CAROTID SYSTEM: Common carotid artery is patent. Calcific atherosclerosis resulting in 1 cm segment of 80% stenosis by NASCET criteria. Mild poststenotic dilatation. VERTEBRAL ARTERIES:Codominant vertebral  arteries. Normal appearance of the vertebral arteries, widely patent. SKELETON: No acute osseous process though bone windows have not been submitted. OTHER NECK: Soft tissues of the neck are nonacute though, not tailored for evaluation. UPPER CHEST: Mild biapical pleural thickening. No superior mediastinal lymphadenopathy. CTA HEAD FINDINGS: ANTERIOR CIRCULATION: Patent cervical internal carotid arteries, petrous, cavernous and supra clinoid internal carotid arteries. Patent anterior communicating artery. Patent anterior and middle cerebral arteries, mild luminal irregularity compatible with atherosclerosis. No large vessel occlusion, significant stenosis, contrast extravasation or aneurysm. POSTERIOR CIRCULATION: Patent vertebral arteries, vertebrobasilar junction and basilar artery, as well as main branch vessels. Patent posterior cerebral arteries, mild luminal irregularity compatible with atherosclerosis. No large vessel occlusion, significant stenosis, contrast extravasation or aneurysm. VENOUS SINUSES: Major dural venous sinuses are patent though not tailored for evaluation on this angiographic examination. ANATOMIC VARIANTS: None. DELAYED PHASE: No abnormal intracranial enhancement. MIP images reviewed. IMPRESSION: CTA NECK: 1. Hemodynamically significant 80% stenosis LEFT ICA. Less than 50% stenosis RIGHT ICA. 2. Patent bilateral vertebral arteries. CTA HEAD: 1. No emergent large vessel occlusion or flow-limiting stenosis. 2. Mild intracranial atherosclerosis. Aortic Atherosclerosis (ICD10-I70.0). Electronically Signed   By: Elon Alas M.D.   On: 05/21/2018 00:57   Mr Brain Wo Contrast  Result Date: 05/20/2018 CLINICAL DATA:  Left-sided weakness and aphasia EXAM: MRI HEAD WITHOUT CONTRAST TECHNIQUE: Multiplanar, multiecho pulse sequences of the brain and surrounding structures were obtained without intravenous contrast. COMPARISON:  None. FINDINGS: BRAIN: There is no acute infarct, acute  hemorrhage, hydrocephalus or extra-axial collection. The midline structures are normal. No midline shift or other mass effect. There are no old infarcts. Multifocal white matter hyperintensity, most commonly due to chronic ischemic microangiopathy. The cerebral and cerebellar volume are age-appropriate. Susceptibility-sensitive sequences show no chronic microhemorrhage or superficial siderosis.  VASCULAR: Major intracranial arterial and venous sinus flow voids are normal. SKULL AND UPPER CERVICAL SPINE: Calvarial bone marrow signal is normal. There is no skull base mass. Visualized upper cervical spine and soft tissues are normal. SINUSES/ORBITS: No fluid levels or advanced mucosal thickening. No mastoid or middle ear effusion. The orbits are normal. IMPRESSION: 1. No acute abnormality. 2. Chronic small vessel ischemia. Electronically Signed   By: Ulyses Jarred M.D.   On: 05/20/2018 21:20   Mr Cervical Spine Wo Contrast  Result Date: 05/21/2018 CLINICAL DATA:  LEFT-sided weakness, now resolved. Gait imbalance and baseline tremor. EXAM: MRI CERVICAL SPINE WITHOUT CONTRAST TECHNIQUE: Multiplanar, multisequence MR imaging of the cervical spine was performed. No intravenous contrast was administered. COMPARISON:  CT angiogram head and neck May 21, 2018 FINDINGS: Motion degraded examination: Moderate motion on sagittal sequences, severely motion degraded axial sequences, nondiagnostic axial T2 gradient. ALIGNMENT: Maintained cervical lordosis.  No malalignment. VERTEBRAE/DISCS: Vertebral bodies are intact. Moderate C3-4, C4-5, C5-6 and moderate to severe C6-7 disc height loss with diffuse disc desiccation. Moderate acute on chronic discogenic endplate changes J2-8. Mild chronic discogenic endplate changes N8-6 through C5-6. Bilateral C8 1 cm perineural cysts. Heterogeneous bone marrow signal most compatible with osteopenia. CORD:Cervical spinal cord is normal morphology and signal characteristics from the  cervicomedullary junction to level of T3-4, the most caudal well-visualized level to motion degrades sensitivity for subtle potential signal abnormality. No syrinx. POSTERIOR FOSSA, VERTEBRAL ARTERIES, PARASPINAL TISSUES: No MR findings of ligamentous injury. Vertebral artery flow voids present. Included posterior fossa and paraspinal soft tissues are normal. DISC LEVELS: Nondiagnostic axial T2 gradient limiting assessment. Mild canal stenosis C3-4 and C4-5. Nondiagnostic assessment of neural foramen due to motion. Prior CT demonstrated severe RIGHT C6-7 neural foraminal narrowing. IMPRESSION: 1. Motion degraded examination, nondiagnostic axial T2 gradient sequence. 2. No fracture or malalignment. 3. Degenerative change of the cervical spine with mild canal stenosis C3-4 and C4-5. Electronically Signed   By: Elon Alas M.D.   On: 05/21/2018 03:49    Scheduled Meds: . aspirin EC  325 mg Oral Daily  . atorvastatin  40 mg Oral Daily  . buPROPion  300 mg Oral Daily  . cholecalciferol  2,000 Units Oral Daily  . enoxaparin (LOVENOX) injection  30 mg Subcutaneous Q24H  . ezetimibe  10 mg Oral QHS  . multivitamin with minerals  1 tablet Oral Daily  . omega-3 acid ethyl esters  1 g Oral Daily  . pantoprazole  40 mg Oral Daily  . venlafaxine  75 mg Oral Daily  . vitamin C  1,000 mg Oral BID   Continuous Infusions: . sodium chloride      Principal Problem:   TIA (transient ischemic attack) Active Problems:   Hypertension   Carotid arterial disease (H[facility])   Hyperlipidemia   CKD (chronic kidney disease), stage III ([facility])   Anemia   Degenerative disc disease, cervical   Depression   GERD (gastroesophageal reflux disease)   Anxiety    Time spent: 93 minutes    [facility]  If 7PM-7AM, please contact night-coverage at [email or web address], password [facility] 05/21/2018, 11:54 AM  LOS: 0 days

## 2018-05-21 NOTE — Progress Notes (Signed)
2D Echocardiogram has been performed.  Shannon Berger 05/21/2018, 9:14 AM

## 2018-05-21 NOTE — Care Management Note (Addendum)
Case Management Note  Patient Details  Name: IYANI DRESNER MRN: [id number] Date of Birth: [birth year]  Subjective/Objective:   Pt in with TIA. She is from home with spouse. Spouse can provide 24 hour supervision.  DME: all needed                 Action/Plan: Per PT patient is signed up for water therapy and would like to continue with that. CM following for d/c needs, physician orders.  Addendum: (1630): I have discussed the patient's current level of function related to TIA  with the patient and spouse.  They acknowledge understanding of this and feel they can provide the level of care the patient will need at home.    Pt discharging home with her spouse. He will provide 24 hour supervision and transport home.   Expected Discharge Date:                  Expected Discharge Plan:     In-House Referral:     Discharge planning Services     Post Acute Care Choice:    Choice offered to:     DME Arranged:    DME Agency:     HH Arranged:    HH Agency:     Status of Service:  In process, will continue to follow  If discussed at Long Length of Stay Meetings, dates discussed:    Additional Comments:  Pollie Friar, RN 05/21/2018, 2:23 PM

## 2018-05-22 ENCOUNTER — Telehealth: Payer: Self-pay

## 2018-05-22 ENCOUNTER — Other Ambulatory Visit: Payer: Self-pay | Admitting: *Deleted

## 2018-05-25 ENCOUNTER — Encounter (HOSPITAL_COMMUNITY): Payer: Self-pay | Admitting: Orthopedic Surgery

## 2018-05-26 ENCOUNTER — Other Ambulatory Visit: Payer: Self-pay

## 2018-05-26 ENCOUNTER — Encounter (HOSPITAL_COMMUNITY): Payer: Self-pay | Admitting: *Deleted

## 2018-05-26 ENCOUNTER — Inpatient Hospital Stay (HOSPITAL_COMMUNITY)
Admission: RE | Admit: 2018-05-26 | Discharge: 2018-05-27 | DRG: 039 | Disposition: A | Discharge status: Home / Self Care | Payer: Medicare Other | Source: Ambulatory Visit | Attending: Vascular Surgery | Admitting: Vascular Surgery

## 2018-05-26 ENCOUNTER — Encounter (HOSPITAL_COMMUNITY)
Admission: RE | Disposition: A | Discharge status: Home / Self Care | Payer: Self-pay | Source: Ambulatory Visit | Attending: Vascular Surgery

## 2018-05-26 ENCOUNTER — Inpatient Hospital Stay (HOSPITAL_COMMUNITY): Payer: Medicare Other

## 2018-05-26 DIAGNOSIS — Z791 Long term (current) use of non-steroidal anti-inflammatories (NSAID): Secondary | ICD-10-CM

## 2018-05-26 DIAGNOSIS — F329 Major depressive disorder, single episode, unspecified: Secondary | ICD-10-CM | POA: Diagnosis present

## 2018-05-26 DIAGNOSIS — E785 Hyperlipidemia, unspecified: Secondary | ICD-10-CM | POA: Diagnosis present

## 2018-05-26 DIAGNOSIS — Z7982 Long term (current) use of aspirin: Secondary | ICD-10-CM | POA: Diagnosis not present

## 2018-05-26 DIAGNOSIS — Z981 Arthrodesis status: Secondary | ICD-10-CM

## 2018-05-26 DIAGNOSIS — N183 Chronic kidney disease, stage 3 (moderate): Secondary | ICD-10-CM | POA: Diagnosis present

## 2018-05-26 DIAGNOSIS — Z79899 Other long term (current) drug therapy: Secondary | ICD-10-CM | POA: Diagnosis not present

## 2018-05-26 DIAGNOSIS — I6522 Occlusion and stenosis of left carotid artery: Principal | ICD-10-CM | POA: Diagnosis present

## 2018-05-26 DIAGNOSIS — Z8673 Personal history of transient ischemic attack (TIA), and cerebral infarction without residual deficits: Secondary | ICD-10-CM | POA: Diagnosis not present

## 2018-05-26 DIAGNOSIS — I129 Hypertensive chronic kidney disease with stage 1 through stage 4 chronic kidney disease, or unspecified chronic kidney disease: Secondary | ICD-10-CM | POA: Diagnosis present

## 2018-05-26 DIAGNOSIS — I6529 Occlusion and stenosis of unspecified carotid artery: Secondary | ICD-10-CM | POA: Diagnosis present

## 2018-05-26 DIAGNOSIS — F419 Anxiety disorder, unspecified: Secondary | ICD-10-CM | POA: Diagnosis not present

## 2018-05-26 DIAGNOSIS — Z885 Allergy status to narcotic agent status: Secondary | ICD-10-CM

## 2018-05-26 DIAGNOSIS — K219 Gastro-esophageal reflux disease without esophagitis: Secondary | ICD-10-CM | POA: Diagnosis not present

## 2018-05-26 DIAGNOSIS — Z96642 Presence of left artificial hip joint: Secondary | ICD-10-CM | POA: Diagnosis present

## 2018-05-26 DIAGNOSIS — I2510 Atherosclerotic heart disease of native coronary artery without angina pectoris: Secondary | ICD-10-CM | POA: Diagnosis not present

## 2018-05-26 DIAGNOSIS — Z85820 Personal history of malignant melanoma of skin: Secondary | ICD-10-CM

## 2018-05-26 HISTORY — DX: Adverse effect of unspecified anesthetic, initial encounter: T41.45XA

## 2018-05-26 HISTORY — DX: Anxiety disorder, unspecified: F41.9

## 2018-05-26 HISTORY — PX: ENDARTERECTOMY: SHX5162

## 2018-05-26 HISTORY — DX: Pain in left leg: M79.605

## 2018-05-26 HISTORY — PX: PATCH ANGIOPLASTY: SHX6230

## 2018-05-26 HISTORY — DX: Malignant melanoma of skin, unspecified: C43.9

## 2018-05-26 HISTORY — DX: Cerebral infarction, unspecified: I63.9

## 2018-05-26 HISTORY — PX: CAROTID ENDARTERECTOMY: SUR193

## 2018-05-26 HISTORY — DX: Low back pain, unspecified: M54.50

## 2018-05-26 HISTORY — DX: Cardiac murmur, unspecified: R01.1

## 2018-05-26 HISTORY — DX: Transient cerebral ischemic attack, unspecified: G45.9

## 2018-05-26 HISTORY — DX: Other complications of anesthesia, initial encounter: T88.59XA

## 2018-05-26 HISTORY — DX: Low back pain: M54.5

## 2018-05-26 LAB — TYPE AND SCREEN
ABO/RH(D): O NEG
Antibody Screen: NEGATIVE

## 2018-05-26 LAB — CBC
HCT: 29 % — ABNORMAL LOW (ref 36.0–46.0)
Hemoglobin: 9.3 g/dL — ABNORMAL LOW (ref 12.0–15.0)
MCH: 32 pg (ref 26.0–34.0)
MCHC: 32.1 g/dL (ref 30.0–36.0)
MCV: 99.7 fL (ref 80.0–100.0)
Platelets: 256 10*3/uL (ref 150–400)
RBC: 2.91 MIL/uL — ABNORMAL LOW (ref 3.87–5.11)
RDW: 12.6 % (ref 11.5–15.5)
WBC: 7.1 10*3/uL (ref 4.0–10.5)
nRBC: 0 % (ref 0.0–0.2)

## 2018-05-26 LAB — CREATININE, SERUM
Creatinine, Ser: 1.04 mg/dL — ABNORMAL HIGH (ref 0.44–1.00)
GFR calc Af Amer: 59 mL/min — ABNORMAL LOW (ref >60)
GFR calc non Af Amer: 50 mL/min — ABNORMAL LOW (ref >60)

## 2018-05-26 LAB — ABO/RH: ABO/RH(D): O NEG

## 2018-05-26 LAB — POCT ACTIVATED CLOTTING TIME
Activated Clotting Time: 219 seconds
Activated Clotting Time: 219 seconds
Activated Clotting Time: 263 seconds

## 2018-05-26 LAB — SURGICAL PCR SCREEN
MRSA, PCR: NEGATIVE
Staphylococcus aureus: NEGATIVE

## 2018-05-26 SURGERY — ENDARTERECTOMY, CAROTID
Anesthesia: General | Site: Neck | Laterality: Left

## 2018-05-26 MED ORDER — FENTANYL CITRATE (PF) 100 MCG/2ML IJ SOLN: 25.0000 ug | INTRAMUSCULAR | Status: DC | PRN

## 2018-05-26 MED ORDER — POTASSIUM CHLORIDE CRYS ER 20 MEQ PO TBCR: 20.0000 meq | EXTENDED_RELEASE_TABLET | Freq: Every day | ORAL | Status: DC | PRN

## 2018-05-26 MED ORDER — DEXTRAN 40 IN SALINE 10-0.9 % IV SOLN
INTRAVENOUS | Status: AC
  Filled 2018-05-26: qty 500

## 2018-05-26 MED ORDER — SODIUM CHLORIDE 0.9 % IV SOLN: INTRAVENOUS | Status: DC

## 2018-05-26 MED ORDER — DEXTRAN 40 IN SALINE 10-0.9 % IV SOLN
INTRAVENOUS | Status: AC | PRN
  Administered 2018-05-26: 25 mL/h

## 2018-05-26 MED ORDER — ONDANSETRON HCL 4 MG/2ML IJ SOLN: 4.0000 mg | Freq: Four times a day (QID) | INTRAMUSCULAR | Status: DC | PRN

## 2018-05-26 MED ORDER — MAGNESIUM SULFATE 2 GM/50ML IV SOLN: 2.0000 g | Freq: Every day | INTRAVENOUS | Status: DC | PRN

## 2018-05-26 MED ORDER — SODIUM CHLORIDE 0.9 % IV SOLN
INTRAVENOUS | Status: DC | PRN
  Administered 2018-05-26: 40 ug/min via INTRAVENOUS

## 2018-05-26 MED ORDER — IBANDRONATE SODIUM 150 MG PO TABS: 150.0000 mg | ORAL_TABLET | ORAL | Status: DC

## 2018-05-26 MED ORDER — GUAIFENESIN-DM 100-10 MG/5ML PO SYRP: 15.0000 mL | ORAL_SOLUTION | ORAL | Status: DC | PRN

## 2018-05-26 MED ORDER — SODIUM CHLORIDE 0.9 % IV SOLN
0.0125 ug/kg/min | INTRAVENOUS | Status: AC
  Administered 2018-05-26: 0.15 ug/kg/min via INTRAVENOUS
  Filled 2018-05-26: qty 2000

## 2018-05-26 MED ORDER — SODIUM CHLORIDE 0.9 % IV SOLN
INTRAVENOUS | Status: AC
  Filled 2018-05-26: qty 1.2

## 2018-05-26 MED ORDER — SODIUM CHLORIDE 0.9 % IV SOLN
INTRAVENOUS | Status: DC
  Administered 2018-05-26: 17:00:00 via INTRAVENOUS

## 2018-05-26 MED ORDER — TRAMADOL HCL 50 MG PO TABS
50.0000 mg | ORAL_TABLET | Freq: Four times a day (QID) | ORAL | Status: DC | PRN
  Administered 2018-05-26 – 2018-05-27 (×2): 50 mg via ORAL
  Filled 2018-05-26 (×3): qty 1

## 2018-05-26 MED ORDER — LIDOCAINE 2% (20 MG/ML) 5 ML SYRINGE
INTRAMUSCULAR | Status: DC | PRN
  Administered 2018-05-26: 50 mg via INTRAVENOUS

## 2018-05-26 MED ORDER — HYDRALAZINE HCL 20 MG/ML IJ SOLN
INTRAMUSCULAR | Status: AC
  Filled 2018-05-26: qty 1

## 2018-05-26 MED ORDER — HEPARIN SODIUM (PORCINE) 1000 UNIT/ML IJ SOLN
INTRAMUSCULAR | Status: DC | PRN
  Administered 2018-05-26: 5500 [IU] via INTRAVENOUS
  Administered 2018-05-26 (×2): 1000 [IU] via INTRAVENOUS

## 2018-05-26 MED ORDER — PANTOPRAZOLE SODIUM 40 MG PO TBEC
40.0000 mg | DELAYED_RELEASE_TABLET | Freq: Every day | ORAL | Status: DC
  Administered 2018-05-27: 40 mg via ORAL
  Filled 2018-05-26: qty 1

## 2018-05-26 MED ORDER — METOPROLOL TARTRATE 5 MG/5ML IV SOLN: 2.0000 mg | INTRAVENOUS | Status: DC | PRN

## 2018-05-26 MED ORDER — BUPROPION HCL ER (XL) 300 MG PO TB24
300.0000 mg | ORAL_TABLET | Freq: Every day | ORAL | Status: DC
  Administered 2018-05-26 – 2018-05-27 (×2): 300 mg via ORAL
  Filled 2018-05-26 (×3): qty 1

## 2018-05-26 MED ORDER — HEPARIN SODIUM (PORCINE) 1000 UNIT/ML IJ SOLN
INTRAMUSCULAR | Status: AC
  Filled 2018-05-26: qty 1

## 2018-05-26 MED ORDER — ACETAMINOPHEN 500 MG PO TABS
500.0000 mg | ORAL_TABLET | ORAL | Status: DC | PRN
  Administered 2018-05-26: 500 mg via ORAL
  Administered 2018-05-26: 1000 mg via ORAL
  Filled 2018-05-26 (×4): qty 1

## 2018-05-26 MED ORDER — SODIUM CHLORIDE 0.9 % IV SOLN
INTRAVENOUS | Status: DC | PRN
  Administered 2018-05-26: 11:00:00

## 2018-05-26 MED ORDER — BISOPROLOL FUMARATE 5 MG PO TABS
10.0000 mg | ORAL_TABLET | Freq: Every day | ORAL | Status: DC
  Administered 2018-05-27: 10 mg via ORAL
  Filled 2018-05-26: qty 2

## 2018-05-26 MED ORDER — LIDOCAINE-EPINEPHRINE (PF) 1 %-1:200000 IJ SOLN
INTRAMUSCULAR | Status: DC | PRN
  Administered 2018-05-26: 10 mL

## 2018-05-26 MED ORDER — MUPIROCIN 2 % EX OINT
TOPICAL_OINTMENT | CUTANEOUS | Status: AC
  Administered 2018-05-26: 1
  Filled 2018-05-26: qty 22

## 2018-05-26 MED ORDER — CHLORHEXIDINE GLUCONATE CLOTH 2 % EX PADS: 6.0000 | MEDICATED_PAD | Freq: Once | CUTANEOUS | Status: DC

## 2018-05-26 MED ORDER — ROCURONIUM BROMIDE 50 MG/5ML IV SOSY
PREFILLED_SYRINGE | INTRAVENOUS | Status: AC
  Filled 2018-05-26: qty 5

## 2018-05-26 MED ORDER — PHENYLEPHRINE 40 MCG/ML (10ML) SYRINGE FOR IV PUSH (FOR BLOOD PRESSURE SUPPORT)
PREFILLED_SYRINGE | INTRAVENOUS | Status: AC
  Filled 2018-05-26: qty 10

## 2018-05-26 MED ORDER — SODIUM CHLORIDE 0.9 % IV SOLN: INTRAVENOUS | Status: DC | PRN

## 2018-05-26 MED ORDER — LIDOCAINE 2% (20 MG/ML) 5 ML SYRINGE
INTRAMUSCULAR | Status: AC
  Filled 2018-05-26: qty 5

## 2018-05-26 MED ORDER — SENNA 8.6 MG PO TABS: 1.0000 | ORAL_TABLET | Freq: Every evening | ORAL | Status: DC | PRN

## 2018-05-26 MED ORDER — VENLAFAXINE HCL 75 MG PO TABS
75.0000 mg | ORAL_TABLET | Freq: Every day | ORAL | Status: DC
Start: 2018-05-26 — End: 2018-05-27
  Administered 2018-05-27: 75 mg via ORAL
  Filled 2018-05-26: qty 1

## 2018-05-26 MED ORDER — CEFAZOLIN SODIUM-DEXTROSE 2-4 GM/100ML-% IV SOLN
2.0000 g | INTRAVENOUS | Status: AC
  Administered 2018-05-26: 2 g via INTRAVENOUS
  Filled 2018-05-26: qty 100

## 2018-05-26 MED ORDER — PROTAMINE SULFATE 10 MG/ML IV SOLN
INTRAVENOUS | Status: DC | PRN
  Administered 2018-05-26 (×4): 10 mg via INTRAVENOUS

## 2018-05-26 MED ORDER — CEFAZOLIN SODIUM-DEXTROSE 2-4 GM/100ML-% IV SOLN
2.0000 g | Freq: Three times a day (TID) | INTRAVENOUS | Status: AC
  Administered 2018-05-26 – 2018-05-27 (×2): 2 g via INTRAVENOUS
  Filled 2018-05-26 (×2): qty 100

## 2018-05-26 MED ORDER — LIDOCAINE-EPINEPHRINE (PF) 1 %-1:200000 IJ SOLN
INTRAMUSCULAR | Status: AC
  Filled 2018-05-26: qty 30

## 2018-05-26 MED ORDER — ENOXAPARIN SODIUM 40 MG/0.4ML ~~LOC~~ SOLN: 40.0000 mg | SUBCUTANEOUS | Status: DC

## 2018-05-26 MED ORDER — ATORVASTATIN CALCIUM 40 MG PO TABS
40.0000 mg | ORAL_TABLET | Freq: Every day | ORAL | Status: DC
  Administered 2018-05-26: 40 mg via ORAL
  Filled 2018-05-26: qty 1

## 2018-05-26 MED ORDER — FENTANYL CITRATE (PF) 100 MCG/2ML IJ SOLN
INTRAMUSCULAR | Status: AC
  Filled 2018-05-26: qty 2

## 2018-05-26 MED ORDER — LABETALOL HCL 5 MG/ML IV SOLN
INTRAVENOUS | Status: AC
  Filled 2018-05-26: qty 4

## 2018-05-26 MED ORDER — PROPOFOL 1000 MG/100ML IV EMUL
INTRAVENOUS | Status: AC
  Filled 2018-05-26: qty 100

## 2018-05-26 MED ORDER — ONDANSETRON HCL 4 MG/2ML IJ SOLN
INTRAMUSCULAR | Status: DC | PRN
  Administered 2018-05-26: 4 mg via INTRAVENOUS

## 2018-05-26 MED ORDER — SUGAMMADEX SODIUM 200 MG/2ML IV SOLN
INTRAVENOUS | Status: DC | PRN
  Administered 2018-05-26: 109.4 mg via INTRAVENOUS

## 2018-05-26 MED ORDER — LACTATED RINGERS IV SOLN
INTRAVENOUS | Status: DC
  Administered 2018-05-26: 09:00:00 via INTRAVENOUS

## 2018-05-26 MED ORDER — ALUM & MAG HYDROXIDE-SIMETH 200-200-20 MG/5ML PO SUSP: 15.0000 mL | ORAL | Status: DC | PRN

## 2018-05-26 MED ORDER — 0.9 % SODIUM CHLORIDE (POUR BTL) OPTIME
TOPICAL | Status: DC | PRN
  Administered 2018-05-26 (×2): 1000 mL

## 2018-05-26 MED ORDER — PHENOL 1.4 % MT LIQD: 1.0000 | OROMUCOSAL | Status: DC | PRN

## 2018-05-26 MED ORDER — DEXAMETHASONE SODIUM PHOSPHATE 10 MG/ML IJ SOLN
INTRAMUSCULAR | Status: AC
  Filled 2018-05-26: qty 1

## 2018-05-26 MED ORDER — ASPIRIN EC 325 MG PO TBEC
325.0000 mg | DELAYED_RELEASE_TABLET | Freq: Every day | ORAL | Status: DC
  Administered 2018-05-26 – 2018-05-27 (×2): 325 mg via ORAL
  Filled 2018-05-26 (×2): qty 1

## 2018-05-26 MED ORDER — HYDRALAZINE HCL 20 MG/ML IJ SOLN
5.0000 mg | INTRAMUSCULAR | Status: AC | PRN
  Administered 2018-05-26 (×2): 5 mg via INTRAVENOUS
  Filled 2018-05-26: qty 1

## 2018-05-26 MED ORDER — LOSARTAN POTASSIUM 50 MG PO TABS
50.0000 mg | ORAL_TABLET | Freq: Every day | ORAL | Status: DC
  Administered 2018-05-26 – 2018-05-27 (×2): 50 mg via ORAL
  Filled 2018-05-26 (×2): qty 1

## 2018-05-26 MED ORDER — FENTANYL CITRATE (PF) 250 MCG/5ML IJ SOLN
INTRAMUSCULAR | Status: DC | PRN
  Administered 2018-05-26: 50 ug via INTRAVENOUS

## 2018-05-26 MED ORDER — ALPRAZOLAM 0.25 MG PO TABS
0.1250 mg | ORAL_TABLET | Freq: Every evening | ORAL | Status: DC | PRN
  Administered 2018-05-26: 0.25 mg via ORAL
  Filled 2018-05-26: qty 1

## 2018-05-26 MED ORDER — DOCUSATE SODIUM 100 MG PO CAPS
100.0000 mg | ORAL_CAPSULE | Freq: Every day | ORAL | Status: DC
  Administered 2018-05-27: 100 mg via ORAL
  Filled 2018-05-26: qty 1

## 2018-05-26 MED ORDER — SODIUM CHLORIDE 0.9 % IV SOLN: 500.0000 mL | Freq: Once | INTRAVENOUS | Status: DC | PRN

## 2018-05-26 MED ORDER — PROPOFOL 500 MG/50ML IV EMUL
INTRAVENOUS | Status: DC | PRN
  Administered 2018-05-26: 75 ug/kg/min via INTRAVENOUS

## 2018-05-26 MED ORDER — ROCURONIUM BROMIDE 50 MG/5ML IV SOSY
PREFILLED_SYRINGE | INTRAVENOUS | Status: DC | PRN
  Administered 2018-05-26: 50 mg via INTRAVENOUS

## 2018-05-26 MED ORDER — DEXAMETHASONE SODIUM PHOSPHATE 10 MG/ML IJ SOLN
INTRAMUSCULAR | Status: DC | PRN
  Administered 2018-05-26: 10 mg via INTRAVENOUS

## 2018-05-26 MED ORDER — LABETALOL HCL 5 MG/ML IV SOLN
10.0000 mg | INTRAVENOUS | Status: AC | PRN
  Administered 2018-05-26 (×4): 10 mg via INTRAVENOUS

## 2018-05-26 MED ORDER — MUPIROCIN 2 % EX OINT
TOPICAL_OINTMENT | Freq: Once | CUTANEOUS | Status: DC
  Filled 2018-05-26: qty 22

## 2018-05-26 MED ORDER — PROPOFOL 10 MG/ML IV BOLUS
INTRAVENOUS | Status: DC | PRN
  Administered 2018-05-26: 60 mg via INTRAVENOUS

## 2018-05-26 MED ORDER — EZETIMIBE 10 MG PO TABS
10.0000 mg | ORAL_TABLET | Freq: Every day | ORAL | Status: DC
  Administered 2018-05-27: 10 mg via ORAL
  Filled 2018-05-26: qty 1

## 2018-05-26 MED ORDER — LACTATED RINGERS IV SOLN
INTRAVENOUS | Status: DC | PRN
  Administered 2018-05-26: 10:00:00 via INTRAVENOUS

## 2018-05-26 MED ORDER — ONDANSETRON HCL 4 MG/2ML IJ SOLN
INTRAMUSCULAR | Status: AC
  Filled 2018-05-26: qty 2

## 2018-05-26 SURGICAL SUPPLY — 45 items
BAG DECANTER FOR FLEXI CONT (MISCELLANEOUS) ×4 IMPLANT
CANISTER SUCT 3000ML PPV (MISCELLANEOUS) ×2 IMPLANT
CANNULA VESSEL 3MM 2 BLNT TIP (CANNULA) ×6 IMPLANT
CATH ROBINSON RED A/P 18FR (CATHETERS) ×2 IMPLANT
CLIP VESOCCLUDE MED 24/CT (CLIP) ×2 IMPLANT
CLIP VESOCCLUDE SM WIDE 24/CT (CLIP) ×2 IMPLANT
COVER WAND RF STERILE (DRAPES) ×2 IMPLANT
CRADLE DONUT ADULT HEAD (MISCELLANEOUS) ×2 IMPLANT
DERMABOND ADVANCED (GAUZE/BANDAGES/DRESSINGS) ×1
DERMABOND ADVANCED .7 DNX12 (GAUZE/BANDAGES/DRESSINGS) ×1 IMPLANT
DRAIN CHANNEL 15F RND FF W/TCR (WOUND CARE) IMPLANT
ELECT REM PT RETURN 9FT ADLT (ELECTROSURGICAL) ×2
ELECTRODE REM PT RTRN 9FT ADLT (ELECTROSURGICAL) ×1 IMPLANT
EVACUATOR SILICONE 100CC (DRAIN) IMPLANT
GLOVE BIO SURGEON STRL SZ7.5 (GLOVE) ×6 IMPLANT
GLOVE BIOGEL PI IND STRL 7.0 (GLOVE) ×1 IMPLANT
GLOVE BIOGEL PI IND STRL 8 (GLOVE) ×1 IMPLANT
GLOVE BIOGEL PI INDICATOR 7.0 (GLOVE) ×1
GLOVE BIOGEL PI INDICATOR 8 (GLOVE) ×1
GLOVE ECLIPSE 7.0 STRL STRAW (GLOVE) ×2 IMPLANT
GLOVE SS BIOGEL STRL SZ 7 (GLOVE) ×1 IMPLANT
GLOVE SUPERSENSE BIOGEL SZ 7 (GLOVE) ×1
GOWN STRL REUS W/ TWL LRG LVL3 (GOWN DISPOSABLE) ×3 IMPLANT
GOWN STRL REUS W/TWL LRG LVL3 (GOWN DISPOSABLE) ×3
KIT BASIN OR (CUSTOM PROCEDURE TRAY) ×2 IMPLANT
KIT SHUNT ARGYLE CAROTID ART 6 (VASCULAR PRODUCTS) IMPLANT
KIT TURNOVER KIT B (KITS) ×2 IMPLANT
NEEDLE HYPO 25GX1X1/2 BEV (NEEDLE) ×2 IMPLANT
NS IRRIG 1000ML POUR BTL (IV SOLUTION) ×6 IMPLANT
PACK CAROTID (CUSTOM PROCEDURE TRAY) ×2 IMPLANT
PAD ARMBOARD 7.5X6 YLW CONV (MISCELLANEOUS) ×4 IMPLANT
PATCH VASC XENOSURE 1CMX6CM (Vascular Products) ×1 IMPLANT
PATCH VASC XENOSURE 1X6 (Vascular Products) ×1 IMPLANT
SHUNT CAROTID BYPASS 10 (VASCULAR PRODUCTS) ×2 IMPLANT
SHUNT CAROTID BYPASS 12 (VASCULAR PRODUCTS) IMPLANT
SPONGE SURGIFOAM ABS GEL 100 (HEMOSTASIS) IMPLANT
SUT PROLENE 6 0 BV (SUTURE) ×4 IMPLANT
SUT SILK 2 0 PERMA HAND 18 BK (SUTURE) IMPLANT
SUT VIC AB 3-0 SH 27 (SUTURE) ×1
SUT VIC AB 3-0 SH 27X BRD (SUTURE) ×1 IMPLANT
SUT VICRYL 4-0 PS2 18IN ABS (SUTURE) ×2 IMPLANT
SYR 20CC LL (SYRINGE) ×2 IMPLANT
SYR CONTROL 10ML LL (SYRINGE) ×2 IMPLANT
TOWEL GREEN STERILE (TOWEL DISPOSABLE) ×2 IMPLANT
WATER STERILE IRR 1000ML POUR (IV SOLUTION) ×2 IMPLANT

## 2018-05-26 NOTE — Anesthesia Procedure Notes (Signed)
Procedure Name: Intubation Date/Time: 05/26/2018 10:47 AM Performed by: Renato Shin, CRNA Pre-anesthesia Checklist: Patient identified, Emergency Drugs available, Suction available and Patient being monitored Patient Re-evaluated:Patient Re-evaluated prior to induction Oxygen Delivery Method: Circle system utilized Preoxygenation: Pre-oxygenation with 100% oxygen Induction Type: IV induction Ventilation: Mask ventilation without difficulty Laryngoscope Size: Mac and 3 Grade View: Grade II Tube type: Oral Tube size: 7.0 mm Number of attempts: 1 Airway Equipment and Method: Stylet Secured at: 23 cm Tube secured with: Tape Dental Injury: Teeth and Oropharynx as per pre-operative assessment  Comments: Blase Mess, SRNA

## 2018-05-26 NOTE — Interval H&P Note (Signed)
History and Physical Interval Note:  05/26/2018 10:09 AM  Shannon Berger  has presented today for surgery, with the diagnosis of LEFT CAROTID STENOSIS  The various methods of treatment have been discussed with the patient and family. After consideration of risks, benefits and other options for treatment, the patient has consented to  Procedure(s): ENDARTERECTOMY CAROTID (Left) as a surgical intervention .  The patient's history has been reviewed, patient examined, no change in status, stable for surgery.  I have reviewed the patient's chart and labs.  Questions were answered to the patient's satisfaction.     [name]

## 2018-05-26 NOTE — Progress Notes (Signed)
PHARMACIST - PHYSICIAN COMMUNICATION  CONCERNING: P&T Medication Policy Regarding Oral Bisphosphonates  RECOMMENDATION: Your order for ibandronate (Boniva) has been discontinued at this time.  * Dosed q30 days, but patient also reported not taking this medication.  If the patient's post-hospital medical condition warrants safe use of this class of drugs, please resume the pre-hospital regimen upon discharge.  DESCRIPTION:  Alendronate (Fosamax), ibandronate (Boniva), and risedronate (Actonel) can cause severe esophageal erosions in patients who are unable to remain upright at least 30 minutes after taking this medication.   Since brief interruptions in therapy are thought to have minimal impact on bone mineral density, the [facility] has established that bisphosphonate orders should be routinely discontinued during hospitalization.   To override this safety policy and permit administration of Boniva, Fosamax, or Actonel in the hospital, prescribers must write "DO NOT HOLD" in the comments section when placing the order for this class of medications.  Kelvin Cellar, RPh Pager: [phone] 05/26/2018 4:24 PM

## 2018-05-26 NOTE — Anesthesia Preprocedure Evaluation (Signed)
Anesthesia Evaluation  Patient identified by MRN, date of birth, ID band Patient awake    Reviewed: Allergy & Precautions, NPO status , Patient's Chart, lab work & pertinent test results, reviewed documented beta blocker date and time   History of Anesthesia Complications Negative for: history of anesthetic complications  Airway Mallampati: II  TM Distance: >3 FB Neck ROM: Full    Dental  (+) Teeth Intact   Pulmonary neg pulmonary ROS,    breath sounds clear to auscultation       Cardiovascular hypertension, Pt. on medications and Pt. on home beta blockers + Peripheral Vascular Disease   Rhythm:Regular     Neuro/Psych PSYCHIATRIC DISORDERS Anxiety Depression TIA   GI/Hepatic GERD  Medicated and Controlled,  Endo/Other    Renal/GU Renal InsufficiencyRenal disease     Musculoskeletal  (+) Arthritis ,   Abdominal   Peds  Hematology  (+) anemia ,   Anesthesia Other Findings 11/19 tte: - Left ventricle: The cavity size was normal. Wall thickness was   normal. Systolic function was normal. The estimated ejection   fraction was in the range of 60% to 65%. Wall motion was normal;   there were no regional wall motion abnormalities. Features are   consistent with a pseudonormal left ventricular filling pattern,   with concomitant abnormal relaxation and increased filling   pressure (grade 2 diastolic dysfunction). - Mitral valve: There was mild regurgitation. - Pulmonary arteries: Systolic pressure was mildly increased. PA   peak pressure: 34 mm Hg (S).  Reproductive/Obstetrics                             Anesthesia Physical Anesthesia Plan  ASA: III  Anesthesia Plan: General   Post-op Pain Management:    Induction: Intravenous  PONV Risk Score and Plan: 3 and Ondansetron, Dexamethasone, Propofol infusion and TIVA  Airway Management Planned: Oral ETT  Additional Equipment: Arterial  line  Intra-op Plan:   Post-operative Plan: Extubation in OR  Informed Consent: I have reviewed the patients History and Physical, chart, labs and discussed the procedure including the risks, benefits and alternatives for the proposed anesthesia with the patient or authorized representative who has indicated his/her understanding and acceptance.   Dental advisory given  Plan Discussed with: CRNA and Surgeon  Anesthesia Plan Comments:         Anesthesia Quick Evaluation

## 2018-05-26 NOTE — Transfer of Care (Signed)
Immediate Anesthesia Transfer of Care Note  Patient: Shannon Berger  Procedure(s) Performed: ENDARTERECTOMY CAROTID LEFT (Left Neck) PATCH ANGIOPLASTY LEFT CAROTID ARTERY (Left Neck)  Patient Location: PACU  Anesthesia Type:General  Level of Consciousness: awake, alert  and patient cooperative  Airway & Oxygen Therapy: Patient Spontanous Breathing and Patient connected to face mask oxygen  Post-op Assessment: Report given to RN, Post -op Vital signs reviewed and stable and Patient moving all extremities X 4  Post vital signs: Reviewed and stable  Last Vitals:  Vitals Value Taken Time  BP 177/83 05/26/2018 12:38 PM  Temp    Pulse 80 05/26/2018 12:43 PM  Resp 14 05/26/2018 12:43 PM  SpO2 100 % 05/26/2018 12:43 PM  Vitals shown include unvalidated device data.  Last Pain:  Vitals:   05/26/18 0855  TempSrc: Oral         Complications: No apparent anesthesia complications

## 2018-05-26 NOTE — Anesthesia Procedure Notes (Signed)
Arterial Line Insertion Start/End11/19/2019 9:52 AM Performed by: Trinna Post., CRNA, CRNA  Preanesthetic checklist: patient identified, IV checked, site marked, risks and benefits discussed, surgical consent, monitors and equipment checked, pre-op evaluation, timeout performed and anesthesia consent Lidocaine 1% used for infiltration radial was placed Catheter size: 20 G Hand hygiene performed , maximum sterile barriers used  and Seldinger technique used Allen's test indicative of satisfactory collateral circulation Attempts: 1 Procedure performed without using ultrasound guided technique. Following insertion, Biopatch and dressing applied. Post procedure assessment: normal  Patient tolerated the procedure well with no immediate complications. Additional procedure comments: Inserted by [employer], SRNA .

## 2018-05-26 NOTE — Discharge Instructions (Signed)
   [facility] [place]  Discharge Instructions   Carotid Endarterectomy (CEA)  Please refer to the following instructions for your post-procedure care. Your surgeon or physician assistant will discuss any changes with you.  Activity  You are encouraged to walk as much as you can. You can slowly return to normal activities but must avoid strenuous activity and heavy lifting until your doctor tell you it's OK. Avoid activities such as vacuuming or swinging a golf club. You can drive after one week if you are comfortable and you are no longer taking prescription pain medications. It is normal to feel tired for serval weeks after your surgery. It is also normal to have difficulty with sleep habits, eating, and bowel movements after surgery. These will go away with time.  Bathing/Showering  You may shower after you come home. Do not soak in a bathtub, hot tub, or swim until the incision heals completely.  Incision Care  Shower every day. Clean your incision with mild soap and water. Pat the area dry with a clean towel. You do not need a bandage unless otherwise instructed. Do not apply any ointments or creams to your incision. You may have skin glue on your incision. Do not peel it off. It will come off on its own in about one week. Your incision may feel thickened and raised for several weeks after your surgery. This is normal and the skin will soften over time. For Men Only: It's OK to shave around the incision but do not shave the incision itself for 2 weeks. It is common to have numbness under your chin that could last for several months.  Diet  Resume your normal diet. There are no special food restrictions following this procedure. A low fat/low cholesterol diet is recommended for all patients with vascular disease. In order to heal from your surgery, it is CRITICAL to get adequate nutrition. Your body requires vitamins, minerals, and protein. Vegetables are the best  source of vitamins and minerals. Vegetables also provide the perfect balance of protein. Processed food has little nutritional value, so try to avoid this.        Medications  Resume taking all of your medications unless your doctor or physician assistant tells you not to. If your incision is causing pain, you may take over-the- counter pain relievers such as acetaminophen (Tylenol). If you were prescribed a stronger pain medication, please be aware these medications can cause nausea and constipation. Prevent nausea by taking the medication with a snack or meal. Avoid constipation by drinking plenty of fluids and eating foods with a high amount of fiber, such as fruits, vegetables, and grains. Do not take Tylenol if you are taking prescription pain medications.  Follow Up  Our office will schedule a follow up appointment 2-3 weeks following discharge.  Please call us immediately for any of the following conditions  Increased pain, redness, drainage (pus) from your incision site. Fever of 101 degrees or higher. If you should develop stroke (slurred speech, difficulty swallowing, weakness on one side of your body, loss of vision) you should call 911 and go to the nearest emergency room.  Reduce your risk of vascular disease:  Stop smoking. If you would like help call [facility] at 1-800-QUIT-NOW (1-800-784-8669) or [place] at 336-586-4000. Manage your cholesterol Maintain a desired weight Control your diabetes Keep your blood pressure down  If you have any questions, please call the office at [phone].   

## 2018-05-26 NOTE — Progress Notes (Signed)
   VASCULAR SURGERY POSTOP NOTE:   The patient is doing well postop.  No specific complaints.  Anticipate discharge tomorrow on aspirin and a statin.  SUBJECTIVE:   Pain well controlled.  PHYSICAL EXAM:   Vitals:   05/26/18 1405 05/26/18 1408 05/26/18 1423 05/26/18 1514  BP:  (!) 155/75  138/68  Pulse: (!) 59 64    Resp: 14 14  16   Temp:   97.7 F (36.5 C) 97.6 F (36.4 C)  TempSrc:    Oral  SpO2: 99% 100%  100%   Her left neck incision looks fine. Neuro intact.  LABS:   Lab Results  Component Value Date   WBC 7.1 05/26/2018   HGB 9.3 (L) 05/26/2018   HCT 29.0 (L) 05/26/2018   MCV 99.7 05/26/2018   PLT 256 05/26/2018    PROBLEM LIST:    Active Problems:   Carotid stenosis   CURRENT MEDS:   . aspirin  325 mg Oral Daily  . atorvastatin  40 mg Oral q1800  . bisoprolol  10 mg Oral Daily  . buPROPion  300 mg Oral Daily  . [START ON 05/27/2018] docusate sodium  100 mg Oral Daily  . [START ON 05/27/2018] enoxaparin (LOVENOX) injection  40 mg Subcutaneous Q24H  . ezetimibe  10 mg Oral Daily  . hydrALAZINE      . labetalol      . labetalol      . losartan  50 mg Oral Daily  . pantoprazole  40 mg Oral Daily  . venlafaxine  75 mg Oral Daily    [name] Beeper: [phone] Office: [phone] 05/26/2018

## 2018-05-26 NOTE — Op Note (Signed)
    NAME: Shannon Berger    MRN: [id number] DOB: 1941-04-27    DATE OF OPERATION: 05/26/2018  PREOP DIAGNOSIS:    Symptomatic left carotid stenosis  POSTOP DIAGNOSIS:    Same  PROCEDURE:    Left carotid endarterectomy with bovine pericardial patch angioplasty  SURGEON: Judeth Cornfield. [name], MD, FACS  ASSIST: Laurence Slate, PA  ANESTHESIA: General  EBL: Minimal  INDICATIONS:    Shannon Berger is a 77 y.o. female who had an episode of expressive aphasia.  CT scan showed an 80% left carotid stenosis.  Left carotid endarterectomy was recommended in order to lower her risk of future stroke.  FINDINGS:   Focal calcific plaque producing in 90% stenosis  TECHNIQUE:   The patient was taken to the operating room and received a general anesthetic.  After careful positioning, the left neck was prepped and draped in usual sterile fashion.  An incision was made along the anterior border the sternocleidomastoid and the dissection carried down to the common carotid artery which was dissected free and controlled with Rummel tourniquet.  The patient was then heparinized.  ACT was monitored throughout the case.  The superior thyroid artery was controlled with a silk tie.  The facial vein was divided between 2-0 silk ties and the internal carotid artery above the plaque was controlled with a vessel loop.  The external carotid artery was controlled with a loop.  Clamps were then placed on the internal then the common and the external carotid artery.  A longitudinal arteriotomy was made in the common carotid artery and this was extended through the plaque into the internal carotid artery.  There was excellent backbleeding.  A 10 shunt was placed into the internal carotid artery and then backbled and then placed into the common carotid artery and secured with Rummel tourniquet.  Flow was reestablished to the shunt.  An endarterectomy plane was established proximally and the plaque was  sharply divided.  Eversion endarterectomy was performed of the external carotid artery.  Distally there was a nice tapering the plaque and no tacking sutures were required.  The artery was then irrigated with copious amounts of heparin and dextran and all loose debris removed.  The bovine pericardial patch was then sewn using continuous 6-0 Prolene suture.  Prior to completing this patch closure the shunt was removed.  The arteries were backbled and flushed appropriately and the anastomosis completed.  Flow was reestablished first to the external carotid artery and into the internal carotid artery.  The heparin was partially reversed with protamine.  Hemostasis was obtained in the wound.  The wound was then closed the deep layer of 3-0 Vicryl, the platysma with running 3-0 Vicryl and the skin closed with 4-0 Vicryl.  Dermabond was applied.  The patient awoke neurologically intact.  She was transferred to the recovery room in stable condition.  All needle and sponge counts were correct.  [name], MD, FACS [facility]  DATE OF DICTATION:   05/26/2018

## 2018-05-27 ENCOUNTER — Encounter (HOSPITAL_COMMUNITY): Payer: Self-pay | Admitting: Vascular Surgery

## 2018-05-27 LAB — BASIC METABOLIC PANEL
Anion gap: 10 (ref 5–15)
BUN: 18 mg/dL (ref 8–23)
CO2: 21 mmol/L — ABNORMAL LOW (ref 22–32)
Calcium: 8.9 mg/dL (ref 8.9–10.3)
Chloride: 108 mmol/L (ref 98–111)
Creatinine, Ser: 1.14 mg/dL — ABNORMAL HIGH (ref 0.44–1.00)
GFR calc Af Amer: 52 mL/min — ABNORMAL LOW (ref >60)
GFR calc non Af Amer: 45 mL/min — ABNORMAL LOW (ref >60)
Glucose, Bld: 105 mg/dL — ABNORMAL HIGH (ref 70–99)
Potassium: 3.7 mmol/L (ref 3.5–5.1)
Sodium: 139 mmol/L (ref 135–145)

## 2018-05-27 LAB — CBC
HCT: 27.8 % — ABNORMAL LOW (ref 36.0–46.0)
Hemoglobin: 9 g/dL — ABNORMAL LOW (ref 12.0–15.0)
MCH: 32.5 pg (ref 26.0–34.0)
MCHC: 32.4 g/dL (ref 30.0–36.0)
MCV: 100.4 fL — ABNORMAL HIGH (ref 80.0–100.0)
Platelets: 260 10*3/uL (ref 150–400)
RBC: 2.77 MIL/uL — ABNORMAL LOW (ref 3.87–5.11)
RDW: 12.9 % (ref 11.5–15.5)
WBC: 6.7 10*3/uL (ref 4.0–10.5)
nRBC: 0 % (ref 0.0–0.2)

## 2018-05-27 MED ORDER — TRAMADOL HCL 50 MG PO TABS: 50.0000 mg | ORAL_TABLET | Freq: Four times a day (QID) | ORAL | 0 refills | Status: DC | PRN

## 2018-05-27 MED FILL — traMADol HCL 50 MG TABS: 50 | 2 days supply | Qty: 8 | Fill #0

## 2018-05-27 NOTE — Anesthesia Postprocedure Evaluation (Signed)
Anesthesia Post Note  Patient: CHEETARA HOGE  Procedure(s) Performed: ENDARTERECTOMY CAROTID LEFT (Left Neck) PATCH ANGIOPLASTY LEFT CAROTID ARTERY (Left Neck)     Patient location during evaluation: PACU Anesthesia Type: General Level of consciousness: awake and alert Pain management: pain level controlled Vital Signs Assessment: post-procedure vital signs reviewed and stable Respiratory status: spontaneous breathing, nonlabored ventilation, respiratory function stable and patient connected to nasal cannula oxygen Cardiovascular status: blood pressure returned to baseline and stable Postop Assessment: no apparent nausea or vomiting Anesthetic complications: no    Last Vitals:  Vitals:   05/27/18 0000 05/27/18 0515  BP: 136/68 (!) 147/73  Pulse: 94 86  Resp: 18 12  Temp: 36.7 C 36.8 C  SpO2: 98% 99%    Last Pain:  Vitals:   05/27/18 0515  TempSrc: Oral  PainSc:                  Raoul Ciano

## 2018-05-27 NOTE — Discharge Summary (Signed)
Discharge Summary     Shannon Berger [birth year] 77 y.o. female  [id number]  Admission Date: 05/26/2018  Discharge Date: 05/27/18  Physician: Angelia Mould, *  Admission Diagnosis: LEFT CAROTID STENOSIS   HPI:   This is a 77 y.o. female with past medical history significant for hypertension, hyperlipidemia, CAD, CKD stage III, chronic back pain, and carotid artery stenosis admitted to the hospital yesterday with an episode of expressive aphasia.  Work-up has included a CT head and MRI brain which are negative for acute CVA.  Neurology has been consulted and have diagnosed patient with a TIA likely related to high-grade left ICA stenosis.  Echocardiogram negative.  This is a patient well-known to  [facility] as she has had regular carotid duplex every 6 months demonstrating a 60 to 79% asymptomatic stenosis of left ICA.  CTA during this admission demonstrates closer to an 80% stenosis of left ICA.  Right ICA estimated to be 50% stenosis.  She describes a story of talking on her phone yesterday with a friend and suddenly could not think of the right words to say.  This was alarming to her however resolved after a few minutes.  She has chronic left leg weakness related to her lumbar spine.  She denies any further strokelike symptoms.  She is taking an aspirin and a statin daily.  She is a non-smoker.  Hospital Course:  The patient was admitted to the hospital and taken to the operating room on 05/26/2018 and underwent left carotid endarterectomy.    Findings: Shannon Berger is a 77 y.o. female who had an episode of expressive aphasia.  CT scan showed an 80% left carotid stenosis.  Left carotid endarterectomy was recommended in order to lower her risk of future stroke.  The pt tolerated the procedure well and was transported to the PACU in good condition.   By POD 1, the pt neuro status was in tact.   She had a slight headache earlier, but this had resolved.  She did not  have any focal weakness or paresthesias and tongue was midline.  The remainder of the hospital course consisted of increasing mobilization and increasing intake of solids without difficulty.   Recent Labs    05/27/18 0322  NA 139  K 3.7  CL 108  CO2 21*  GLUCOSE 105*  BUN 18  CALCIUM 8.9   Recent Labs    05/26/18 1604 05/27/18 0322  WBC 7.1 6.7  HGB 9.3* 9.0*  HCT 29.0* 27.8*  PLT 256 260   No results for input(s): INR in the last 72 hours.   Discharge Instructions    Discharge patient   Complete by:  As directed    Discharge disposition:  01-Home or Self Care   Discharge patient date:  05/27/2018      Discharge Diagnosis:  LEFT CAROTID STENOSIS  Secondary Diagnosis: Patient Active Problem List   Diagnosis Date Noted  . Carotid stenosis 05/26/2018  . Degenerative disc disease, cervical 05/21/2018  . GERD (gastroesophageal reflux disease) 05/20/2018  . CKD (chronic kidney disease), stage III ([place]) 05/20/2018  . Anemia 05/20/2018  . TIA (transient ischemic attack) 05/20/2018  . Ischemic optic neuropathy of right eye 02/06/2017  . Epiretinal membrane (ERM) of right eye 11/14/2016  . Pseudophakia of both eyes 11/14/2016  . Status post hardware removal 07/11/2016  . Spinal stenosis of lumbar region S/P lumbar fusion 11/22/2015  . Anxiety 11/22/2015  . Constipation 11/22/2015  . Depression 11/22/2015  . Fusion of  spine of lumbosacral region 05/02/2015  . Leg pain, bilateral 03/22/2015  . Leg weakness, bilateral 03/22/2015  . H/O total hip arthroplasty 11/22/2014  . Lumbar canal stenosis 09/28/2014  . Pain of left sacroiliac joint 08/15/2014  . Degeneration of intervertebral disc of lumbar region 08/01/2014  . LBP (low back pain) 08/01/2014  . Dislocation of hip joint prosthesis ([place]) 12/11/2012  . Degenerative arthritis of hip 09/04/2012  . Occlusion and stenosis of carotid artery without mention of cerebral infarction 12/25/2011  . Chest pain 04/15/2011    . PVC (premature ventricular contraction) 04/15/2011  . Hypertension   . Carotid arterial disease ([place])   . Hyperlipidemia   . Pilonidal cyst 02/05/2011   Past Medical History:  Diagnosis Date  . Anxiety   . Arthritis   . Carotid arterial disease ([place])   . Complication of anesthesia    "hallucinates for days; have a hard time waking up; almost couldn't get her awake last time; wake up wild" (05/26/2018)  . Constipation   . Degeneration of lumbar intervertebral disc   . Dislocation of hip joint prosthesis ([place])   . Fusion of spine of lumbar region   . Heart murmur    "slight one" (05/26/2018)  . Hyperlipidemia   . Hypertension   . Low back pain radiating to left lower extremity   . Melanoma ([place])    "center of upper chest"  . Osteoarthritis   . Osteopenia   . Sleep difficulties   . Spinal stenosis at L4-L5 level   . Stroke ([place])    tia--last week  . TIA (transient ischemic attack) 05/20/2018    Allergies as of 05/27/2018      Reactions   Oxycodone-acetaminophen Anaphylaxis   Versed [midazolam] Other (See Comments)   "makes me hyper and have hallucinations"   Hydrocodone    Hydrocodone-acetaminophen Nausea And Vomiting   Oxycodone       Medication List    TAKE these medications   ALPRAZolam 0.25 MG tablet Commonly known as:  XANAX Take 0.125-0.25 mg by mouth at bedtime as needed for sleep.   aspirin 325 MG EC tablet Take 1 tablet (325 mg total) by mouth daily.   atorvastatin 40 MG tablet Commonly known as:  LIPITOR Take 40 mg by mouth daily.   bisoprolol 5 MG tablet Commonly known as:  ZEBETA Take 2 tablets (10 mg total) by mouth daily.   buPROPion 300 MG 24 hr tablet Commonly known as:  WELLBUTRIN XL Take 300 mg by mouth daily.   CALCIUM PO Take 1,200 mg by mouth daily.   ezetimibe 10 MG tablet Commonly known as:  ZETIA Take 10 mg by mouth daily.   FISH OIL PO Take 1,400 mg by mouth 2 (two) times daily.   ibandronate 150 MG tablet Commonly  known as:  BONIVA Take 150 mg by mouth every 30 (thirty) days.   losartan 50 MG tablet Commonly known as:  COZAAR Take 50 mg by mouth daily.   meloxicam 15 MG tablet Commonly known as:  MOBIC Take 15 mg by mouth as needed for pain.   multivitamin tablet Take 1 tablet by mouth daily.   omeprazole 20 MG capsule Commonly known as:  PRILOSEC Take 20 mg by mouth daily.   senna 8.6 MG Tabs tablet Commonly known as:  SENOKOT Take 1 tablet by mouth at bedtime as needed for mild constipation.   traMADol 50 MG tablet Commonly known as:  ULTRAM Take 1 tablet (50 mg total) by mouth  every 6 (six) hours as needed for moderate pain.   TYMLOS [place] Inject 80 mcg into the skin daily.   venlafaxine 25 MG tablet Commonly known as:  EFFEXOR Take 75 mg by mouth daily.   VITAMIN C PO Take 1,000 mg by mouth 2 (two) times daily.   Vitamin D 50 MCG (2000 UT) tablet Take 2,000 Units by mouth as directed.        [facility] Discharge Instructions Carotid Endarterectomy (CEA)  Please refer to the following instructions for your post-procedure care. Your surgeon or physician assistant will discuss any changes with you.  Activity  You are encouraged to walk as much as you can. You can slowly return to normal activities but must avoid strenuous activity and heavy lifting until your doctor tell you it's OK. Avoid activities such as vacuuming or swinging a golf club. You can drive after one week if you are comfortable and you are no longer taking prescription pain medications. It is normal to feel tired for serval weeks after your surgery. It is also normal to have difficulty with sleep habits, eating, and bowel movements after surgery. These will go away with time.  Bathing/Showering  You may shower after you come home. Do not soak in a bathtub, hot tub, or swim until the incision heals completely.  Incision Care  Shower every day. Clean your incision with mild  soap and water. Pat the area dry with a clean towel. You do not need a bandage unless otherwise instructed. Do not apply any ointments or creams to your incision. You may have skin glue on your incision. Do not peel it off. It will come off on its own in about one week. Your incision may feel thickened and raised for several weeks after your surgery. This is normal and the skin will soften over time. For Men Only: It's OK to shave around the incision but do not shave the incision itself for 2 weeks. It is common to have numbness under your chin that could last for several months.  Diet  Resume your normal diet. There are no special food restrictions following this procedure. A low fat/low cholesterol diet is recommended for all patients with vascular disease. In order to heal from your surgery, it is CRITICAL to get adequate nutrition. Your body requires vitamins, minerals, and protein. Vegetables are the best source of vitamins and minerals. Vegetables also provide the perfect balance of protein. Processed food has little nutritional value, so try to avoid this.  Medications  Resume taking all of your medications unless your doctor or physician assistant tells you not to.  If your incision is causing pain, you may take over-the- counter pain relievers such as acetaminophen (Tylenol). If you were prescribed a stronger pain medication, please be aware these medications can cause nausea and constipation.  Prevent nausea by taking the medication with a snack or meal. Avoid constipation by drinking plenty of fluids and eating foods with a high amount of fiber, such as fruits, vegetables, and grains.  Do not take Tylenol if you are taking prescription pain medications.  Follow Up  Our office will schedule a follow up appointment 2-3 weeks following discharge.  Please call us immediately for any of the following conditions  . Increased pain, redness, drainage (pus) from your incision site. . Fever of 101  degrees or higher. . If you should develop stroke (slurred speech, difficulty swallowing, weakness on one side of your body, loss of vision)  you should call 911 and go to the nearest emergency room. .  Reduce your risk of vascular disease:  . Stop smoking. If you would like help call [facility] at 1-800-QUIT-NOW 7791067762) or [place] at (918)228-6758. . Manage your cholesterol . Maintain a desired weight . Control your diabetes . Keep your blood pressure down .  If you have any questions, please call the office at [phone].  Prescriptions given: 1.  Tramadol#8 No Refill   Disposition: home  Patient's condition: is Good  Follow up: 1. Dr. [name] in 2 weeks.   Leontine Locket, PA-C [facility] [phone]   --- For [facility] use ---   Modified Rankin score at D/C (0-6): 0  IV medication needed for:  1. Hypertension: No 2. Hypotension: No  Post-op Complications: No  1. Post-op CVA or TIA: No  If yes: Event classification (right eye, left eye, right cortical, left cortical, verterobasilar, other): n/a  If yes: Timing of event (intra-op, <6 hrs post-op, >=6 hrs post-op, unknown): na/  2. CN injury: No  If yes: CN n/a injuried   3. Myocardial infarction: No  If yes: Dx by (EKG or clinical, Troponin): n/a  4.  CHF: No  5.  Dysrhythmia (new): No  6. Wound infection: No  7. Reperfusion symptoms: No  8. Return to OR: No  If yes: return to OR for (bleeding, neurologic, other CEA incision, other): n/a  Discharge medications: Statin use:  Yes ASA use:  Yes   Beta blocker use:  Yes ACE-Inhibitor use:  No  ARB use:  Yes CCB use: No P2Y12 Antagonist use: No, [ ]  Plavix, [ ]  Plasugrel, [ ]  Ticlopinine, [ ]  Ticagrelor, [ ]  Other, [ ]  No for medical reason, [ ]  Non-compliant, [ ]  Not-indicated Anti-coagulant use:  No, [ ]  Warfarin, [ ]  Rivaroxaban, [ ]  Dabigatran,

## 2018-05-27 NOTE — Progress Notes (Signed)
05/27/2018 12:31 PM Discharge AVS meds taken today and those due this evening reviewed.  Follow-up appointments and when to call md reviewed.  D/C IV and TELE.  Questions and concerns addressed.   D/C home per orders. [place]

## 2018-05-27 NOTE — Progress Notes (Signed)
   VASCULAR SURGERY ASSESSMENT & PLAN:   1 Day Post-Op s/p: Left carotid endarterectomy.  Doing well.  Home today on aspirin and a statin.  I will see her back in 2 to 3 weeks.  SUBJECTIVE:   She had a slight headache earlier but this has resolved.  PHYSICAL EXAM:   Vitals:   05/26/18 1925 05/26/18 1945 05/27/18 0000 05/27/18 0515  BP: (!) 174/71 138/68 136/68 (!) 147/73  Pulse:  88 94 86  Resp:  16 18 12   Temp:  97.7 F (36.5 C) 98.1 F (36.7 C) 98.2 F (36.8 C)  TempSrc:  Oral Oral Oral  SpO2:  100% 98% 99%   Her neck incision looks fine. NEURO: No focal weakness or paresthesias.  Tongue is midline.  LABS:   Lab Results  Component Value Date   WBC 6.7 05/27/2018   HGB 9.0 (L) 05/27/2018   HCT 27.8 (L) 05/27/2018   MCV 100.4 (H) 05/27/2018   PLT 260 05/27/2018   Lab Results  Component Value Date   CREATININE 1.14 (H) 05/27/2018    PROBLEM LIST:    Active Problems:   Carotid stenosis   CURRENT MEDS:   . aspirin  325 mg Oral Daily  . atorvastatin  40 mg Oral q1800  . bisoprolol  10 mg Oral Daily  . buPROPion  300 mg Oral Daily  . docusate sodium  100 mg Oral Daily  . enoxaparin (LOVENOX) injection  40 mg Subcutaneous Q24H  . ezetimibe  10 mg Oral Daily  . losartan  50 mg Oral Daily  . pantoprazole  40 mg Oral Daily  . venlafaxine  75 mg Oral Daily    [name] Beeper: [phone] Office: [phone] 05/27/2018

## 2018-05-27 NOTE — Care Management Note (Signed)
Case Management Note  Patient Details  Name: Shannon Berger MRN: [id number] Date of Birth: August 27, 1940  Subjective/Objective:    S/p CEA                Action/Plan: PTA home. No rehab needs. No DME needs. Meds filled by [facility] pharmacy and will be delivered to bedside prior to discharge   Expected Discharge Date:  05/27/18               Expected Discharge Plan:  Home/Self Care  In-House Referral:  NA  Discharge planning Services  NA  Post Acute Care Choice:  NA Choice offered to:  NA  DME Arranged:  N/A DME Agency:  NA  HH Arranged:  NA HH Agency:  NA  Status of Service:  Completed, signed off  If discussed at [place] of Stay Meetings, dates discussed:    Additional Comments:  Bartholomew Crews, RN 05/27/2018, 12:25 PM

## 2018-05-28 ENCOUNTER — Telehealth: Payer: Self-pay | Admitting: Vascular Surgery

## 2018-05-28 NOTE — Telephone Encounter (Signed)
-----   Message from Mena Goes, RN sent at 05/26/2018  2:57 PM EST ----- Regarding: 2-3 weeks postop CEA   ----- Message ----- From: Ulyses Amor, PA-C Sent: 05/26/2018  12:09 PM EST To: [facility] Admin Pool, [facility] Charge Pool   S/P left CEA f/u with Dr. [name] in 2-3 weeks

## 2018-05-28 NOTE — Telephone Encounter (Signed)
sch appt spk to pt 06/17/18 1030am p/o MD

## 2018-06-01 ENCOUNTER — Ambulatory Visit (HOSPITAL_COMMUNITY)
Admission: RE | Admit: 2018-06-01 | Discharge: 2018-06-01 | Disposition: A | Discharge status: Home / Self Care | Payer: Medicare Other | Source: Ambulatory Visit | Attending: Physician Assistant | Admitting: Physician Assistant

## 2018-06-01 ENCOUNTER — Encounter (HOSPITAL_COMMUNITY): Payer: Self-pay

## 2018-06-01 ENCOUNTER — Ambulatory Visit (HOSPITAL_COMMUNITY): Admission: RE | Admit: 2018-06-01 | Payer: Medicare Other | Source: Ambulatory Visit

## 2018-06-01 ENCOUNTER — Encounter: Payer: Self-pay | Admitting: *Deleted

## 2018-06-01 DIAGNOSIS — Z006 Encounter for examination for normal comparison and control in clinical research program: Secondary | ICD-10-CM

## 2018-06-01 DIAGNOSIS — R0600 Dyspnea, unspecified: Secondary | ICD-10-CM

## 2018-06-01 DIAGNOSIS — R0609 Other forms of dyspnea: Secondary | ICD-10-CM | POA: Diagnosis not present

## 2018-06-01 DIAGNOSIS — R072 Precordial pain: Secondary | ICD-10-CM | POA: Diagnosis not present

## 2018-06-01 MED ORDER — IOPAMIDOL (ISOVUE-370) INJECTION 76%
80.0000 mL | Freq: Once | INTRAVENOUS | Status: AC | PRN
  Administered 2018-06-01: 80 mL via INTRAVENOUS

## 2018-06-01 MED ORDER — METOPROLOL TARTRATE 5 MG/5ML IV SOLN
5.0000 mg | INTRAVENOUS | Status: DC | PRN
  Administered 2018-06-01: 5 mg via INTRAVENOUS

## 2018-06-01 MED ORDER — METOPROLOL TARTRATE 5 MG/5ML IV SOLN
INTRAVENOUS | Status: AC
  Filled 2018-06-01: qty 25

## 2018-06-01 MED ORDER — NITROGLYCERIN 0.4 MG SL SUBL
0.8000 mg | SUBLINGUAL_TABLET | Freq: Once | SUBLINGUAL | Status: AC
  Administered 2018-06-01: 0.8 mg via SUBLINGUAL
  Filled 2018-06-01: qty 25

## 2018-06-01 MED ORDER — DILTIAZEM HCL 25 MG/5ML IV SOLN
5.0000 mg | Freq: Once | INTRAVENOUS | Status: DC
  Filled 2018-06-01: qty 5

## 2018-06-01 MED ORDER — NITROGLYCERIN 0.4 MG SL SUBL
SUBLINGUAL_TABLET | SUBLINGUAL | Status: AC
  Filled 2018-06-01: qty 2

## 2018-06-01 MED ORDER — METOPROLOL TARTRATE 5 MG/5ML IV SOLN
10.0000 mg | INTRAVENOUS | Status: DC | PRN
  Administered 2018-06-01: 10 mg via INTRAVENOUS

## 2018-06-01 NOTE — Research (Signed)
Subject met inclusion and exclusion criteria.  The informed consent form, study requirements and expectations were reviewed with the subject and questions and concerns were addressed prior to the signing of the consent form.  The subject verbalized understanding of the trial requirements.  The subject agreed to participate in the CADfem G4 trial and signed the informed consent.  The informed consent was obtained prior to performance of any protocol-specific procedures for the subject.  A copy of the signed informed consent was given to the subject and a copy was placed in the subject's medical record.

## 2018-06-02 DIAGNOSIS — N183 Chronic kidney disease, stage 3 (moderate): Secondary | ICD-10-CM | POA: Diagnosis not present

## 2018-06-02 DIAGNOSIS — I6522 Occlusion and stenosis of left carotid artery: Secondary | ICD-10-CM | POA: Diagnosis not present

## 2018-06-02 DIAGNOSIS — I6529 Occlusion and stenosis of unspecified carotid artery: Secondary | ICD-10-CM | POA: Diagnosis not present

## 2018-06-02 DIAGNOSIS — I10 Essential (primary) hypertension: Secondary | ICD-10-CM | POA: Diagnosis not present

## 2018-06-17 ENCOUNTER — Ambulatory Visit (INDEPENDENT_AMBULATORY_CARE_PROVIDER_SITE_OTHER): Payer: Self-pay | Admitting: Vascular Surgery

## 2018-06-17 ENCOUNTER — Other Ambulatory Visit: Payer: Self-pay

## 2018-06-17 ENCOUNTER — Encounter: Payer: Self-pay | Admitting: Vascular Surgery

## 2018-06-17 VITALS — BP 138/72 | HR 58 | Temp 97.0°F | Resp 14 | Ht 61.0 in | Wt 114.0 lb

## 2018-06-17 DIAGNOSIS — I6523 Occlusion and stenosis of bilateral carotid arteries: Secondary | ICD-10-CM

## 2018-06-17 NOTE — Progress Notes (Signed)
Patient name: Shannon Berger MRN: [id number] DOB: 1941/03/13 Sex: female  REASON FOR VISIT:   Follow-up after left carotid endarterectomy  HPI:   Shannon Berger is a pleasant 77 y.o. female who I been following with bilateral carotid disease.  The patient had an episode of expressive a aphasia and CT scan showed an 80% left carotid stenosis.  Left carotid endarterectomy was recommended in order to lower her risk of future stroke.  She underwent left carotid endarterectomy with bovine pericardial patch angioplasty on 05/26/2018.  She comes in for her first outpatient visit.  Of note she had a less than 39% right carotid stenosis.  Since discharge she has been doing well and has no specific complaints.  She denies any focal weakness or paresthesias.  She is on aspirin and is on a statin.  She is not a smoker.  Current Outpatient Medications  Medication Sig Dispense Refill  . Abaloparatide (TYMLOS [place]) Inject 80 mcg into the skin daily.     Marland Kitchen ALPRAZolam (XANAX) 0.25 MG tablet Take 0.125-0.25 mg by mouth at bedtime as needed for sleep.     . Ascorbic Acid (VITAMIN C PO) Take 1,000 mg by mouth 2 (two) times daily.     Marland Kitchen aspirin EC 325 MG EC tablet Take 1 tablet (325 mg total) by mouth daily. 30 tablet 0  . atorvastatin (LIPITOR) 40 MG tablet Take 40 mg by mouth daily.  3  . bisoprolol (ZEBETA) 5 MG tablet Take 2 tablets (10 mg total) by mouth daily.    Marland Kitchen buPROPion (WELLBUTRIN XL) 300 MG 24 hr tablet Take 300 mg by mouth daily.    Marland Kitchen CALCIUM PO Take 1,200 mg by mouth daily.     . Cholecalciferol (VITAMIN D) 2000 units tablet Take 2,000 Units by mouth as directed.    . ezetimibe (ZETIA) 10 MG tablet Take 10 mg by mouth daily.    Marland Kitchen losartan (COZAAR) 50 MG tablet Take 50 mg by mouth daily.    . meloxicam (MOBIC) 15 MG tablet Take 15 mg by mouth as needed for pain.     . Multiple Vitamin (MULTIVITAMIN) tablet Take 1 tablet by mouth daily.      . Omega-3 Fatty Acids (FISH OIL PO) Take  1,400 mg by mouth 2 (two) times daily.     Marland Kitchen omeprazole (PRILOSEC) 20 MG capsule Take 20 mg by mouth daily.    Marland Kitchen senna (SENOKOT) 8.6 MG TABS tablet Take 1 tablet by mouth at bedtime as needed for mild constipation.    . traMADol (ULTRAM) 50 MG tablet Take 1 tablet (50 mg total) by mouth every 6 (six) hours as needed for moderate pain. 8 tablet 0  . venlafaxine (EFFEXOR) 25 MG tablet Take 75 mg by mouth daily.     Marland Kitchen ibandronate (BONIVA) 150 MG tablet Take 150 mg by mouth every 30 (thirty) days.      No current facility-administered medications for this visit.     REVIEW OF SYSTEMS:  [X]  denotes positive finding, [ ]  denotes negative finding Vascular    Leg swelling    Cardiac    Chest pain or chest pressure:    Shortness of breath upon exertion:    Short of breath when lying flat:    Irregular heart rhythm:    Constitutional    Fever or chills:     PHYSICAL EXAM:   Vitals:   06/17/18 1039 06/17/18 1044  BP: 138/73 138/72  Pulse: (!) 58 (!)  58  Resp: 14   Temp: (!) 97 F (36.1 C)   TempSrc: Oral   Weight: 114 lb (51.7 kg)   Height: 5\' 1"  (1.549 m)     GENERAL: The patient is a well-nourished female, in no acute distress. The vital signs are documented above. CARDIOVASCULAR: There is a regular rate and rhythm. PULMONARY: There is good air exchange bilaterally without wheezing or rales. I do not detect carotid bruits. NEURO: She has no focal weakness or paresthesias.  DATA:   No new data  MEDICAL ISSUES:   STATUS POST LEFT CAROTID ENDARTERECTOMY: The patient is doing well status post left carotid endarterectomy.  She is on aspirin and is on a statin.  She has a less than 39% right carotid stenosis.  I have ordered a follow-up carotid duplex scan in 6 months and I will see her back at that time.  She knows to call sooner if she has problems.  [name] [facility] [phone]

## 2018-06-19 DIAGNOSIS — L720 Epidermal cyst: Secondary | ICD-10-CM | POA: Diagnosis not present

## 2018-07-07 ENCOUNTER — Other Ambulatory Visit: Payer: Self-pay

## 2018-07-07 ENCOUNTER — Ambulatory Visit (INDEPENDENT_AMBULATORY_CARE_PROVIDER_SITE_OTHER): Payer: Self-pay | Admitting: Family

## 2018-07-07 ENCOUNTER — Encounter: Payer: Self-pay | Admitting: Family

## 2018-07-07 VITALS — BP 176/81 | HR 58 | Temp 97.5°F | Resp 16 | Ht 61.5 in | Wt 114.0 lb

## 2018-07-07 DIAGNOSIS — I6523 Occlusion and stenosis of bilateral carotid arteries: Secondary | ICD-10-CM

## 2018-07-07 DIAGNOSIS — Z9889 Other specified postprocedural states: Secondary | ICD-10-CM

## 2018-07-07 NOTE — Patient Instructions (Signed)

## 2018-07-07 NOTE — Progress Notes (Signed)
Chief Complaint: Follow up Extracranial Carotid Artery Stenosis   History of Present Illness  Shannon Berger is a 77 y.o. female who is s/p left carotid endarterectomy with bovine pericardial patch angioplasty on 05/26/2018 by Dr. [name].  The patient had an episode of expressive aphasia and CT scan showed an 80% left carotid stenosis.  Left carotid endarterectomy was recommended in order to lower her risk of future stroke.  Right eye vision issue started just after right eye surgery on 12-16-16 for wrinkled retina, pt states this is slowly improving.  Letter enclosed with pt chart (12-31-16 visit) from her eye surgeon, Dr. Cordelia Pen, states that he is "concerned that there may have been an AiON or remote vascular occlusion as  she has definitive carotid artery disease. Her blind spot seems to be improving but still CF vision is so atypical."  Dr. [name] last evaluated pt on 06-17-18. At that time patient was doing well status post left carotid endarterectomy.  She was on aspirin and is on a statin.  She had a less than 39% right carotid stenosis. Pt was to follow-up in 6 months with carotid duplex and be evaluated at that time.    She returns today with c/o exposed sutures at left CEA site. She denies any subsequent TIA symptoms or stroke.    Diabetic: no Tobacco use: non-smoker  Pt meds include: Statin : yes ASA: yes Other anticoagulants/antiplatelets: no   Past Medical History:  Diagnosis Date  . Anxiety   . Arthritis   . Carotid arterial disease ([place])   . Complication of anesthesia    "hallucinates for days; have a hard time waking up; almost couldn't get her awake last time; wake up wild" (05/26/2018)  . Constipation   . Degeneration of lumbar intervertebral disc   . Dislocation of hip joint prosthesis ([place])   . Fusion of spine of lumbar region   . Heart murmur    "slight one" (05/26/2018)  . Hyperlipidemia   . Hypertension   . Low back pain radiating to left  lower extremity   . Melanoma ([place])    "center of upper chest"  . Osteoarthritis   . Osteopenia   . Sleep difficulties   . Spinal stenosis at L4-L5 level   . Stroke ([place])    tia--last week  . TIA (transient ischemic attack) 05/20/2018    Social History Social History   Tobacco Use  . Smoking status: Never Smoker  . Smokeless tobacco: Never Used  Substance Use Topics  . Alcohol use: Yes    Alcohol/week: 0.0 standard drinks    Comment: 05/26/2018 "nothing now; wine gives me headaches"  . Drug use: Never    Family History Family History  Problem Relation Age of Onset  . Heart failure Mother   . Cancer Mother        colon  . Hypertension Mother   . Other Mother        Varicose Veins  . Diabetes Father   . Heart disease Father   . Hypertension Father   . Heart attack Father   . Cancer Brother        squamos cell - head, neck, ear    Surgical History Past Surgical History:  Procedure Laterality Date  . ANTERIOR LUMBAR FUSION     added rods and screws;  Marland Kitchen BACK SURGERY    . BREAST BIOPSY Left    "not cancer"  . BUNIONECTOMY Left   . CAROTID ENDARTERECTOMY Left 05/26/2018  .  CATARACT EXTRACTION W/ INTRAOCULAR LENS  IMPLANT, BILATERAL Bilateral   . ENDARTERECTOMY Left 05/26/2018   Procedure: ENDARTERECTOMY CAROTID LEFT;  Surgeon: Angelia Mould, MD;  Location: [facility];  Service: Vascular;  Laterality: Left;  . EYE SURGERY Right    "retina wrinkle OR; now can't see out of that eye" (05/26/2018)  . JOINT REPLACEMENT Left 2014   Hip  . LUMBAR SPINE HARDWARE REMOVAL     "went in thru the front"  . MELANOMA EXCISION     "center of upper chest"  . PATCH ANGIOPLASTY Left 05/26/2018   Procedure: PATCH ANGIOPLASTY LEFT CAROTID ARTERY;  Surgeon: Angelia Mould, MD;  Location: [facility];  Service: Vascular;  Laterality: Left;  . PILONIDAL CYST / SINUS EXCISION    . POSTERIOR LUMBAR FUSION  X 2  . TENDON REPAIR Left    Reattachment of tendon- Left hip  . THUMB  FUSION Right   . TONSILLECTOMY AND ADENOIDECTOMY    . TOTAL HIP ARTHROPLASTY Left 09/04/2012   Procedure: LEFT TOTAL HIP ARTHROPLASTY ANTERIOR APPROACH;  Surgeon: Mcarthur Rossetti, MD;  Location: [facility];  Service: Orthopedics;  Laterality: Left;  Left Total Hip Arthroplasty, Anterior Approach    Allergies  Allergen Reactions  . Oxycodone-Acetaminophen Anaphylaxis  . Versed [Midazolam] Other (See Comments)    "makes me hyper and have hallucinations"  . Hydrocodone   . Hydrocodone-Acetaminophen Nausea And Vomiting  . Oxycodone     Current Outpatient Medications  Medication Sig Dispense Refill  . Abaloparatide (TYMLOS ) Inject 80 mcg into the skin daily.     Marland Kitchen ALPRAZolam (XANAX) 0.25 MG tablet Take 0.125-0.25 mg by mouth at bedtime as needed for sleep.     . Ascorbic Acid (VITAMIN C PO) Take 1,000 mg by mouth 2 (two) times daily.     Marland Kitchen aspirin EC 325 MG EC tablet Take 1 tablet (325 mg total) by mouth daily. 30 tablet 0  . atorvastatin (LIPITOR) 40 MG tablet Take 40 mg by mouth daily.  3  . bisoprolol (ZEBETA) 5 MG tablet Take 2 tablets (10 mg total) by mouth daily.    Marland Kitchen buPROPion (WELLBUTRIN XL) 300 MG 24 hr tablet Take 300 mg by mouth daily.    Marland Kitchen CALCIUM PO Take 1,200 mg by mouth daily.     . Cholecalciferol (VITAMIN D) 2000 units tablet Take 2,000 Units by mouth as directed.    . ezetimibe (ZETIA) 10 MG tablet Take 10 mg by mouth daily.    Marland Kitchen ibandronate (BONIVA) 150 MG tablet Take 150 mg by mouth every 30 (thirty) days.     Marland Kitchen losartan (COZAAR) 50 MG tablet Take 50 mg by mouth daily.    . meloxicam (MOBIC) 15 MG tablet Take 15 mg by mouth as needed for pain.     . Multiple Vitamin (MULTIVITAMIN) tablet Take 1 tablet by mouth daily.      . Omega-3 Fatty Acids (FISH OIL PO) Take 1,400 mg by mouth 2 (two) times daily.     Marland Kitchen omeprazole (PRILOSEC) 20 MG capsule Take 20 mg by mouth daily.    Marland Kitchen senna (SENOKOT) 8.6 MG TABS tablet Take 1 tablet by mouth at bedtime as needed for mild  constipation.    . traMADol (ULTRAM) 50 MG tablet Take 1 tablet (50 mg total) by mouth every 6 (six) hours as needed for moderate pain. 8 tablet 0  . venlafaxine (EFFEXOR) 25 MG tablet Take 75 mg by mouth daily.  No current facility-administered medications for this visit.     Review of Systems : See HPI for pertinent positives and negatives.  Physical Examination  Vitals:   07/07/18 1331  BP: (!) 176/81  Pulse: (!) 58  Resp: 16  Temp: (!) 97.5 F (36.4 C)  TempSrc: Oral  SpO2: 99%  Weight: 114 lb (51.7 kg)  Height: 5' 1.5" (1.562 m)   Body mass index is 21.19 kg/m.  General: WDWN petite female in NAD GAIT: normal Eyes: PERRLA HENT: No gross abnormalities.  Pulmonary:  Respirations are non-labored, good air movement in all fields, CTAB, no rales, rhonchi, or wheezing. Cardiac: regular rhythm, bradycardic at 58/minute (on a beta blocker), no detected murmur.  VASCULAR EXAM Carotid Bruits Right Left   Negative Negative     Abdominal aortic pulse is not palpable. Radial pulses are 2+ palpable and equal.                                                                                                               Gastrointestinal: soft, nontender, BS WNL, no r/g, no palpable masses. Musculoskeletal: no muscle atrophy/wasting. M/S 5/5 throughout, extremities without ischemic changes. Skin: No rashes, no ulcers, no cellulitis.   Neurologic:  A&O X 3; appropriate affect, sensation is normal; speech is normal, CN 2-12 intact, pain and light touch intact in extremities, motor exam as listed above. Psychiatric: Normal thought content, mood appropriate to clinical situation.    Assessment: Shannon Berger is a 77 y.o. female who is s/p left carotid endarterectomy with bovine pericardial patch angioplasty on 05/26/2018 by Dr. [name].  She had expressive aphasia prior to the left CEA. She has not had any subsequent neurological events.  MR of her brain six days prior to  the left CEA showed no evidence of stroke.   She returns for a stand of dissolvable suture at her left CEA site that she would like trimmed.   This was snipped from the proximal tip of the incision which is well healed, no signs of infection, no separation, no swelling.    DATA  MR of brain w/o contrast (05-20-18): 1. No acute abnormality. 2. Chronic small vessel ischemia.   Plan: Follow-up in 6 months with Carotid Duplex scan as advised by Dr. [name] earlier this month.    I discussed in depth with the patient the nature of atherosclerosis, and emphasized the importance of maximal medical management including strict control of blood pressure, blood glucose, and lipid levels, obtaining regular exercise, and continued cessation of smoking.  The patient is aware that without maximal medical management the underlying atherosclerotic disease process will progress, limiting the benefit of any interventions. The patient was given information about stroke prevention and what symptoms should prompt the patient to seek immediate medical care. Thank you for allowing [place] to participate in this patient's care.  Clemon Chambers, RN, MSN, FNP-C [facility] Office: [phone]  Clinic Physician: Early  07/07/18 1:39 PM

## 2018-07-10 NOTE — Progress Notes (Signed)
Cardiology Office Note    Date:  07/13/2018   ID:  [name], [name] 08/01/1940, MRN [id number]  PCP:  [name], MD  Cardiologist:  Dr. Martinique  Chief Complaint  Patient presents with  . Shortness of Breath    History of Present Illness:  Shannon Berger is a 78 y.o. female is seen for follow up dyspnea. She has a  past medical history of carotid artery disease, hypertension, hyperlipidemia, ischemic optic neuropathy, and osteopenia.  She was seen in 2016 for evaluation of chest pain.  She was complaining of chest pain not related to exertion at the time.  She had a similar symptom in the past with normal stress echo in 2012 and normal ETT in 2008.  She eventually underwent a Myoview on 03/24/2015 which showed EF 68%, normal perfusion, overall low risk study.    Her carotid artery disease has been followed by Dr. [name] of vascular surgery.    She was seen by Almyra Deforest PA-C in October 2019 for symptoms of dyspnea on exertion. She had evaluation with an Echo showing normal LV systolic and valve function. Grade 2 diastolic dysfunction. Coronary CTA was done showing calcium score of 264 but nonobstructive CAD. It was felt that her scoliosis may be contributing to her dyspnea. She reported having PFTs but no results available for [place] to review.  In November 2019 she was admitted with aphasia and left sided weakness. CT and MRI showed no acute findings but carotid dopplers showed progression of carotid arterial disease with > 80% left carotid stenosis. She subsequently underwent left CEA by Dr [name] on 27/78/24 without complications.   On follow up today she reports she did great with her surgery with no complications and no recurrent Neurologic symptoms. She also reports her dyspnea is much better. She has had some sharp pains intermittently for many years in her chest and this hasn't changed.   Past Medical History:  Diagnosis Date  . Anxiety   . Arthritis   . Carotid  arterial disease ([place])   . Complication of anesthesia    "hallucinates for days; have a hard time waking up; almost couldn't get her awake last time; wake up wild" (05/26/2018)  . Constipation   . Degeneration of lumbar intervertebral disc   . Dislocation of hip joint prosthesis ([place])   . Fusion of spine of lumbar region   . Heart murmur    "slight one" (05/26/2018)  . Hyperlipidemia   . Hypertension   . Low back pain radiating to left lower extremity   . Melanoma ([place])    "center of upper chest"  . Osteoarthritis   . Osteopenia   . Sleep difficulties   . Spinal stenosis at L4-L5 level   . Stroke ([place])    tia--last week  . TIA (transient ischemic attack) 05/20/2018    Past Surgical History:  Procedure Laterality Date  . ANTERIOR LUMBAR FUSION     added rods and screws;  Marland Kitchen BACK SURGERY    . BREAST BIOPSY Left    "not cancer"  . BUNIONECTOMY Left   . CAROTID ENDARTERECTOMY Left 05/26/2018  . CATARACT EXTRACTION W/ INTRAOCULAR LENS  IMPLANT, BILATERAL Bilateral   . ENDARTERECTOMY Left 05/26/2018   Procedure: ENDARTERECTOMY CAROTID LEFT;  Surgeon: Angelia Mould, MD;  Location: [facility];  Service: Vascular;  Laterality: Left;  . EYE SURGERY Right    "retina wrinkle OR; now can't see out of that eye" (05/26/2018)  . JOINT REPLACEMENT  Left 2014   Hip  . LUMBAR SPINE HARDWARE REMOVAL     "went in thru the front"  . MELANOMA EXCISION     "center of upper chest"  . PATCH ANGIOPLASTY Left 05/26/2018   Procedure: PATCH ANGIOPLASTY LEFT CAROTID ARTERY;  Surgeon: Angelia Mould, MD;  Location: [facility];  Service: Vascular;  Laterality: Left;  . PILONIDAL CYST / SINUS EXCISION    . POSTERIOR LUMBAR FUSION  X 2  . TENDON REPAIR Left    Reattachment of tendon- Left hip  . THUMB FUSION Right   . TONSILLECTOMY AND ADENOIDECTOMY    . TOTAL HIP ARTHROPLASTY Left 09/04/2012   Procedure: LEFT TOTAL HIP ARTHROPLASTY ANTERIOR APPROACH;  Surgeon: Mcarthur Rossetti, MD;   Location: [facility];  Service: Orthopedics;  Laterality: Left;  Left Total Hip Arthroplasty, Anterior Approach    Current Medications: Outpatient Medications Prior to Visit  Medication Sig Dispense Refill  . Abaloparatide (TYMLOS [place]) Inject 80 mcg into the skin daily.     Marland Kitchen ALPRAZolam (XANAX) 0.25 MG tablet Take 0.125-0.25 mg by mouth at bedtime as needed for sleep.     . Ascorbic Acid (VITAMIN C PO) Take 1,000 mg by mouth 2 (two) times daily.     Marland Kitchen aspirin EC 325 MG EC tablet Take 1 tablet (325 mg total) by mouth daily. 30 tablet 0  . atorvastatin (LIPITOR) 40 MG tablet Take 40 mg by mouth daily.  3  . bisoprolol (ZEBETA) 5 MG tablet Take 2 tablets (10 mg total) by mouth daily.    Marland Kitchen buPROPion (WELLBUTRIN XL) 300 MG 24 hr tablet Take 300 mg by mouth daily.    Marland Kitchen CALCIUM PO Take 1,200 mg by mouth daily.     . Cholecalciferol (VITAMIN D) 2000 units tablet Take 2,000 Units by mouth as directed.    . ezetimibe (ZETIA) 10 MG tablet Take 10 mg by mouth daily.    . hydrochlorothiazide (HYDRODIURIL) 25 MG tablet Take 25 mg by mouth daily.    Marland Kitchen losartan (COZAAR) 50 MG tablet Take 50 mg by mouth daily.    . meloxicam (MOBIC) 15 MG tablet Take 15 mg by mouth as needed for pain.     . Multiple Vitamin (MULTIVITAMIN) tablet Take 1 tablet by mouth daily.      . Omega-3 Fatty Acids (FISH OIL PO) Take 1,400 mg by mouth 2 (two) times daily.     Marland Kitchen omeprazole (PRILOSEC) 20 MG capsule Take 20 mg by mouth daily.    Marland Kitchen senna (SENOKOT) 8.6 MG TABS tablet Take 1 tablet by mouth at bedtime as needed for mild constipation.    . traMADol (ULTRAM) 50 MG tablet Take 1 tablet (50 mg total) by mouth every 6 (six) hours as needed for moderate pain. 8 tablet 0  . venlafaxine (EFFEXOR) 25 MG tablet Take 75 mg by mouth daily.     Marland Kitchen ibandronate (BONIVA) 150 MG tablet Take 150 mg by mouth every 30 (thirty) days.      No facility-administered medications prior to visit.      Allergies:   Oxycodone-acetaminophen; Versed  [midazolam]; Hydrocodone; Hydrocodone-acetaminophen; and Oxycodone   Social History   Socioeconomic History  . Marital status: Married    Spouse name: Not on file  . Number of children: 2  . Years of education: Not on file  . Highest education level: Not on file  Occupational History  . Occupation: Artist: RETIRED    Comment: retired  Social Needs  . Financial resource strain: Not on file  . Food insecurity:    Worry: Not on file    Inability: Not on file  . Transportation needs:    Medical: Not on file    Non-medical: Not on file  Tobacco Use  . Smoking status: Never Smoker  . Smokeless tobacco: Never Used  Substance and Sexual Activity  . Alcohol use: Yes    Alcohol/week: 0.0 standard drinks    Comment: 05/26/2018 "nothing now; wine gives me headaches"  . Drug use: Never  . Sexual activity: Not on file    Comment: 1st intercourse- 23, partners- 1, married- 44 yrs   Lifestyle  . Physical activity:    Days per week: Not on file    Minutes per session: Not on file  . Stress: Not on file  Relationships  . Social connections:    Talks on phone: Not on file    Gets together: Not on file    Attends religious service: Not on file    Active member of club or organization: Not on file    Attends meetings of clubs or organizations: Not on file    Relationship status: Not on file  Other Topics Concern  . Not on file  Social History Narrative  . Not on file     Family History:  The patient's family history includes Cancer in her brother and mother; Diabetes in her father; Heart attack in her father; Heart disease in her father; Heart failure in her mother; Hypertension in her father and mother; Other in her mother.   ROS:   Please see the history of present illness.    ROS All other systems reviewed and are negative.   PHYSICAL EXAM:   VS:  BP 120/70   Pulse (!) 106   Ht 5\' 1"  (1.549 m)   Wt 112 lb 12.8 oz (51.2 kg)   BMI 21.31 kg/m    GEN:  Well nourished, well developed, in no acute distress  HEENT: normal  Neck: no JVD, carotid bruits, or masses Cardiac: RRR; no murmurs, rubs, or gallops,no edema  Respiratory:  clear to auscultation bilaterally, normal work of breathing GI: soft, nontender, nondistended, + BS MS: no deformity or atrophy  Skin: warm and dry, no rash Neuro:  Alert and Oriented x 3, Strength and sensation are intact Psych: euthymic mood, full affect  Wt Readings from Last 3 Encounters:  07/13/18 112 lb 12.8 oz (51.2 kg)  07/07/18 114 lb (51.7 kg)  06/17/18 114 lb (51.7 kg)      Studies/Labs Reviewed:    Recent Labs: 05/20/2018: ALT 27; B Natriuretic Peptide 532.1 05/27/2018: BUN 18; Creatinine, Ser 1.14; Hemoglobin 9.0; Platelets 260; Potassium 3.7; Sodium 139   Lipid Panel    Component Value Date/Time   CHOL 174 05/21/2018 0412   TRIG 59 05/21/2018 0412   HDL 71 05/21/2018 0412   CHOLHDL 2.5 05/21/2018 0412   VLDL 12 05/21/2018 0412   LDLCALC 91 05/21/2018 0412    Additional studies/ records that were reviewed today include:   Myoview 03/24/2015 Study Highlights    The left ventricular ejection fraction is hyperdynamic (>65%).  Nuclear stress EF: 68%.  There was no ST segment deviation noted during stress.  The study is normal.  This is a low risk study.  Normal myocardial perfusion and function   Low risk lexiscan nuclear stress test demonstrating normal myocardial perfusion and function; EF 68%,   Echo 05/21/18: Study Conclusions  -  Left ventricle: The cavity size was normal. Wall thickness was   normal. Systolic function was normal. The estimated ejection   fraction was in the range of 60% to 65%. Wall motion was normal;   there were no regional wall motion abnormalities. Features are   consistent with a pseudonormal left ventricular filling pattern,   with concomitant abnormal relaxation and increased filling   pressure (grade 2 diastolic dysfunction). - Mitral valve:  There was mild regurgitation. - Pulmonary arteries: Systolic pressure was mildly increased. PA   peak pressure: 34 mm Hg (S).  ADDENDUM REPORT: 06/01/2018 22:20  CLINICAL DATA:  Chest pain  EXAM: Cardiac CTA  MEDICATIONS: Sub lingual nitro. 4mg  x 2  TECHNIQUE: The patient was scanned on a Siemens 818 slice scanner. Gantry rotation speed was 250 msecs. Collimation was 0.6 mm. A 100 kV prospective scan was triggered in the ascending thoracic aorta at 35-75% of the R-R interval. Average HR during the scan was 60 bpm. The 3D data set was interpreted on a dedicated work station using MPR, MIP and VRT modes. A total of 80cc of contrast was used.  FINDINGS: Non-cardiac: See separate report from [facility].  The pulmonary veins drained normally to the left atrium.  Calcium Score: 260 Agatston units.  Coronary Arteries: Right dominant with no anomalies  LM: No plaque or stenosis.  LAD system: Calcified plaque proximal LAD with mild (<50%) stenosis.  Circumflex system: Moderate ramus with no significant disease. The AV LCx was relatively small with no plaque or stenosis.  RCA system: Calcified plaque with mild stenosis (<50%) in the proximal RCA.  IMPRESSION: 1. Coronary artery calcium score 260 Agatston units. This places the patient in the 73rd percentile for age and gender, suggesting intermediate risk for future cardiac events.  2.  Nonobstructive CAD.  Dalton Mclean  OVER-READ INTERPRETATION  CT CHEST  The following report is an over-read performed by radiologist Dr. Rolm Baptise of [facility], [place] on 06/01/2018. This over-read does not include interpretation of cardiac or coronary anatomy or pathology. The coronary CTA interpretation by the cardiologist is attached.  COMPARISON:  Chest CT 12/26/2015  FINDINGS: Vascular: Tortuous aorta.  No aneurysm.  Heart is normal size.  Mediastinum/Nodes: No adenopathy in the lower  mediastinum or hila.  Lungs/Pleura: No confluent opacities or effusions.  Upper Abdomen: Small low-density lesion in the right hepatic lobe is stable most compatible with cyst. No acute findings  Musculoskeletal: Chest wall soft tissues are unremarkable. No acute bony abnormality.  IMPRESSION: No acute extra cardiac abnormality.  Tortuous aorta.  Electronically Signed: By: Rolm Baptise M.D. On: 06/01/2018 11:45  ASSESSMENT:    1. Coronary artery disease involving native coronary artery of native heart without angina pectoris   2. Essential hypertension   3. Bilateral carotid artery stenosis   4. Hyperlipidemia, unspecified hyperlipidemia type      PLAN:  In order of problems listed above:  1. Dyspnea on exertion: resolved. No etiology for this based on her cardiac evaluation  2. Nonobstructive CAD. Discussed findings of CTA. Continue to focus on risk factor modification. Let us know if she has any change in symptoms of chest pain or SOB.   3. Bilateral carotid artery disease: s/p recent left CEA.   4. Hypertension: Blood pressure is well controlled.   5. Hyperlipidemia:on lipitor, Zetia and fish oil. Goal LDL < 70.    Medication Adjustments/Labs and Tests Ordered: Current medicines are reviewed at length with the patient today.  Concerns regarding  medicines are outlined above.  Medication changes, Labs and Tests ordered today are listed in the Patient Instructions below. There are no Patient Instructions on file for this visit.   Signed, Peter Martinique, MD  07/13/2018 2:52 PM    [facility] [address], [place], [place]  [address] Phone: [phone]; Fax: [phone]

## 2018-07-13 ENCOUNTER — Encounter: Payer: Self-pay | Admitting: Cardiology

## 2018-07-13 ENCOUNTER — Ambulatory Visit: Payer: Medicare Other | Admitting: Cardiology

## 2018-07-13 VITALS — BP 120/70 | HR 106 | Ht 61.0 in | Wt 112.8 lb

## 2018-07-13 DIAGNOSIS — I10 Essential (primary) hypertension: Secondary | ICD-10-CM

## 2018-07-13 DIAGNOSIS — I6523 Occlusion and stenosis of bilateral carotid arteries: Secondary | ICD-10-CM

## 2018-07-13 DIAGNOSIS — E785 Hyperlipidemia, unspecified: Secondary | ICD-10-CM

## 2018-07-13 DIAGNOSIS — I2510 Atherosclerotic heart disease of native coronary artery without angina pectoris: Secondary | ICD-10-CM | POA: Diagnosis not present

## 2018-07-22 ENCOUNTER — Encounter (HOSPITAL_COMMUNITY): Payer: Self-pay

## 2018-07-22 ENCOUNTER — Ambulatory Visit: Payer: Self-pay | Admitting: Vascular Surgery

## 2018-07-30 DIAGNOSIS — R2689 Other abnormalities of gait and mobility: Secondary | ICD-10-CM | POA: Diagnosis not present

## 2018-07-30 DIAGNOSIS — M545 Low back pain: Secondary | ICD-10-CM | POA: Diagnosis not present

## 2018-07-30 DIAGNOSIS — M6281 Muscle weakness (generalized): Secondary | ICD-10-CM | POA: Diagnosis not present

## 2018-08-04 DIAGNOSIS — R2689 Other abnormalities of gait and mobility: Secondary | ICD-10-CM | POA: Diagnosis not present

## 2018-08-04 DIAGNOSIS — M6281 Muscle weakness (generalized): Secondary | ICD-10-CM | POA: Diagnosis not present

## 2018-08-04 DIAGNOSIS — M545 Low back pain: Secondary | ICD-10-CM | POA: Diagnosis not present

## 2018-08-05 ENCOUNTER — Encounter: Payer: Self-pay | Admitting: Neurology

## 2018-08-05 DIAGNOSIS — N183 Chronic kidney disease, stage 3 (moderate): Secondary | ICD-10-CM | POA: Diagnosis not present

## 2018-08-05 DIAGNOSIS — M545 Low back pain: Secondary | ICD-10-CM | POA: Diagnosis not present

## 2018-08-05 DIAGNOSIS — I6522 Occlusion and stenosis of left carotid artery: Secondary | ICD-10-CM | POA: Diagnosis not present

## 2018-08-05 DIAGNOSIS — F3289 Other specified depressive episodes: Secondary | ICD-10-CM | POA: Diagnosis not present

## 2018-08-05 DIAGNOSIS — M810 Age-related osteoporosis without current pathological fracture: Secondary | ICD-10-CM | POA: Diagnosis not present

## 2018-08-06 DIAGNOSIS — R2689 Other abnormalities of gait and mobility: Secondary | ICD-10-CM | POA: Diagnosis not present

## 2018-08-06 DIAGNOSIS — M6281 Muscle weakness (generalized): Secondary | ICD-10-CM | POA: Diagnosis not present

## 2018-08-06 DIAGNOSIS — M545 Low back pain: Secondary | ICD-10-CM | POA: Diagnosis not present

## 2018-08-11 DIAGNOSIS — R2689 Other abnormalities of gait and mobility: Secondary | ICD-10-CM | POA: Diagnosis not present

## 2018-08-11 DIAGNOSIS — M545 Low back pain: Secondary | ICD-10-CM | POA: Diagnosis not present

## 2018-08-11 DIAGNOSIS — M6281 Muscle weakness (generalized): Secondary | ICD-10-CM | POA: Diagnosis not present

## 2018-08-13 DIAGNOSIS — F419 Anxiety disorder, unspecified: Secondary | ICD-10-CM | POA: Diagnosis not present

## 2018-08-17 DIAGNOSIS — M6281 Muscle weakness (generalized): Secondary | ICD-10-CM | POA: Diagnosis not present

## 2018-08-17 DIAGNOSIS — M545 Low back pain: Secondary | ICD-10-CM | POA: Diagnosis not present

## 2018-08-17 DIAGNOSIS — R2689 Other abnormalities of gait and mobility: Secondary | ICD-10-CM | POA: Diagnosis not present

## 2018-08-19 DIAGNOSIS — M6281 Muscle weakness (generalized): Secondary | ICD-10-CM | POA: Diagnosis not present

## 2018-08-19 DIAGNOSIS — R2689 Other abnormalities of gait and mobility: Secondary | ICD-10-CM | POA: Diagnosis not present

## 2018-08-19 DIAGNOSIS — M545 Low back pain: Secondary | ICD-10-CM | POA: Diagnosis not present

## 2018-08-25 DIAGNOSIS — M6281 Muscle weakness (generalized): Secondary | ICD-10-CM | POA: Diagnosis not present

## 2018-08-25 DIAGNOSIS — M545 Low back pain: Secondary | ICD-10-CM | POA: Diagnosis not present

## 2018-08-25 DIAGNOSIS — R2689 Other abnormalities of gait and mobility: Secondary | ICD-10-CM | POA: Diagnosis not present

## 2018-08-27 DIAGNOSIS — M545 Low back pain: Secondary | ICD-10-CM | POA: Diagnosis not present

## 2018-08-27 DIAGNOSIS — R2689 Other abnormalities of gait and mobility: Secondary | ICD-10-CM | POA: Diagnosis not present

## 2018-08-27 DIAGNOSIS — M6281 Muscle weakness (generalized): Secondary | ICD-10-CM | POA: Diagnosis not present

## 2018-09-01 DIAGNOSIS — M545 Low back pain: Secondary | ICD-10-CM | POA: Diagnosis not present

## 2018-09-01 DIAGNOSIS — M6281 Muscle weakness (generalized): Secondary | ICD-10-CM | POA: Diagnosis not present

## 2018-09-01 DIAGNOSIS — R2689 Other abnormalities of gait and mobility: Secondary | ICD-10-CM | POA: Diagnosis not present

## 2018-09-03 DIAGNOSIS — M545 Low back pain: Secondary | ICD-10-CM | POA: Diagnosis not present

## 2018-09-03 DIAGNOSIS — M6281 Muscle weakness (generalized): Secondary | ICD-10-CM | POA: Diagnosis not present

## 2018-09-03 DIAGNOSIS — R2689 Other abnormalities of gait and mobility: Secondary | ICD-10-CM | POA: Diagnosis not present

## 2018-09-08 DIAGNOSIS — M545 Low back pain: Secondary | ICD-10-CM | POA: Diagnosis not present

## 2018-09-08 DIAGNOSIS — R2689 Other abnormalities of gait and mobility: Secondary | ICD-10-CM | POA: Diagnosis not present

## 2018-09-08 DIAGNOSIS — M6281 Muscle weakness (generalized): Secondary | ICD-10-CM | POA: Diagnosis not present

## 2018-09-10 DIAGNOSIS — H02054 Trichiasis without entropian left upper eyelid: Secondary | ICD-10-CM | POA: Diagnosis not present

## 2018-09-10 DIAGNOSIS — F419 Anxiety disorder, unspecified: Secondary | ICD-10-CM | POA: Diagnosis not present

## 2018-09-10 DIAGNOSIS — F329 Major depressive disorder, single episode, unspecified: Secondary | ICD-10-CM | POA: Diagnosis not present

## 2018-09-10 DIAGNOSIS — H4720 Unspecified optic atrophy: Secondary | ICD-10-CM | POA: Diagnosis not present

## 2018-09-10 DIAGNOSIS — H04123 Dry eye syndrome of bilateral lacrimal glands: Secondary | ICD-10-CM | POA: Diagnosis not present

## 2018-09-10 DIAGNOSIS — H02051 Trichiasis without entropian right upper eyelid: Secondary | ICD-10-CM | POA: Diagnosis not present

## 2018-09-11 DIAGNOSIS — M6281 Muscle weakness (generalized): Secondary | ICD-10-CM | POA: Diagnosis not present

## 2018-09-11 DIAGNOSIS — M545 Low back pain: Secondary | ICD-10-CM | POA: Diagnosis not present

## 2018-09-11 DIAGNOSIS — R2689 Other abnormalities of gait and mobility: Secondary | ICD-10-CM | POA: Diagnosis not present

## 2018-09-16 DIAGNOSIS — M48062 Spinal stenosis, lumbar region with neurogenic claudication: Secondary | ICD-10-CM | POA: Diagnosis not present

## 2018-10-21 DIAGNOSIS — F329 Major depressive disorder, single episode, unspecified: Secondary | ICD-10-CM | POA: Diagnosis not present

## 2018-10-21 DIAGNOSIS — F419 Anxiety disorder, unspecified: Secondary | ICD-10-CM | POA: Diagnosis not present

## 2018-11-03 DIAGNOSIS — M4325 Fusion of spine, thoracolumbar region: Secondary | ICD-10-CM | POA: Diagnosis not present

## 2018-11-03 DIAGNOSIS — M4327 Fusion of spine, lumbosacral region: Secondary | ICD-10-CM | POA: Diagnosis not present

## 2018-11-03 DIAGNOSIS — M48062 Spinal stenosis, lumbar region with neurogenic claudication: Secondary | ICD-10-CM | POA: Diagnosis not present

## 2018-11-03 DIAGNOSIS — Z9889 Other specified postprocedural states: Secondary | ICD-10-CM | POA: Diagnosis not present

## 2018-11-11 DIAGNOSIS — F419 Anxiety disorder, unspecified: Secondary | ICD-10-CM | POA: Diagnosis not present

## 2018-11-11 DIAGNOSIS — F329 Major depressive disorder, single episode, unspecified: Secondary | ICD-10-CM | POA: Diagnosis not present

## 2018-11-12 DIAGNOSIS — L84 Corns and callosities: Secondary | ICD-10-CM | POA: Diagnosis not present

## 2018-11-12 DIAGNOSIS — D692 Other nonthrombocytopenic purpura: Secondary | ICD-10-CM | POA: Diagnosis not present

## 2018-11-12 DIAGNOSIS — L821 Other seborrheic keratosis: Secondary | ICD-10-CM | POA: Diagnosis not present

## 2018-11-12 DIAGNOSIS — L814 Other melanin hyperpigmentation: Secondary | ICD-10-CM | POA: Diagnosis not present

## 2018-11-13 DIAGNOSIS — I951 Orthostatic hypotension: Secondary | ICD-10-CM | POA: Diagnosis not present

## 2018-11-13 DIAGNOSIS — I129 Hypertensive chronic kidney disease with stage 1 through stage 4 chronic kidney disease, or unspecified chronic kidney disease: Secondary | ICD-10-CM | POA: Diagnosis not present

## 2018-11-13 DIAGNOSIS — R42 Dizziness and giddiness: Secondary | ICD-10-CM | POA: Diagnosis not present

## 2018-11-13 DIAGNOSIS — N183 Chronic kidney disease, stage 3 (moderate): Secondary | ICD-10-CM | POA: Diagnosis not present

## 2018-11-19 DIAGNOSIS — I779 Disorder of arteries and arterioles, unspecified: Secondary | ICD-10-CM | POA: Diagnosis not present

## 2018-11-19 DIAGNOSIS — Z961 Presence of intraocular lens: Secondary | ICD-10-CM | POA: Diagnosis not present

## 2018-11-19 DIAGNOSIS — H47011 Ischemic optic neuropathy, right eye: Secondary | ICD-10-CM | POA: Diagnosis not present

## 2018-11-19 DIAGNOSIS — H35371 Puckering of macula, right eye: Secondary | ICD-10-CM | POA: Diagnosis not present

## 2018-11-19 DIAGNOSIS — Q078 Other specified congenital malformations of nervous system: Secondary | ICD-10-CM | POA: Insufficient documentation

## 2018-12-04 DIAGNOSIS — Z1231 Encounter for screening mammogram for malignant neoplasm of breast: Secondary | ICD-10-CM | POA: Diagnosis not present

## 2018-12-04 DIAGNOSIS — Z803 Family history of malignant neoplasm of breast: Secondary | ICD-10-CM | POA: Diagnosis not present

## 2018-12-08 DIAGNOSIS — F419 Anxiety disorder, unspecified: Secondary | ICD-10-CM | POA: Diagnosis not present

## 2018-12-08 DIAGNOSIS — F329 Major depressive disorder, single episode, unspecified: Secondary | ICD-10-CM | POA: Diagnosis not present

## 2018-12-22 DIAGNOSIS — N183 Chronic kidney disease, stage 3 (moderate): Secondary | ICD-10-CM | POA: Diagnosis not present

## 2018-12-22 DIAGNOSIS — I6522 Occlusion and stenosis of left carotid artery: Secondary | ICD-10-CM | POA: Diagnosis not present

## 2018-12-22 DIAGNOSIS — R634 Abnormal weight loss: Secondary | ICD-10-CM | POA: Diagnosis not present

## 2018-12-22 DIAGNOSIS — I951 Orthostatic hypotension: Secondary | ICD-10-CM | POA: Diagnosis not present

## 2018-12-30 DIAGNOSIS — I129 Hypertensive chronic kidney disease with stage 1 through stage 4 chronic kidney disease, or unspecified chronic kidney disease: Secondary | ICD-10-CM | POA: Diagnosis not present

## 2018-12-30 DIAGNOSIS — Z79899 Other long term (current) drug therapy: Secondary | ICD-10-CM | POA: Diagnosis not present

## 2019-01-01 NOTE — Progress Notes (Addendum)
HISTORY AND PHYSICAL     CC:  follow up. Requesting Provider:  [name], MD  HPI: This is a 78 y.o. female here for follow up for carotid artery stenosis.  Pt is s/p left CEA by Dr. Scot Berger on 05/26/18 for symptomatic carotid artery stenosis that included an episode of expressive aphasia.  Pt was last seen 07/07/18 with c/o exposed sutures at the incision site.  She did not have any TIA or stroke sx.    She states that she has done well since surgery.  She has not had any stroke sx since her last visit including visual disturbances, weakness or hemiparesis or speech difficulties.  She states that her PCP increased the dose of her aspirin to 325mg  daily.    The pt is on a statin for cholesterol management.  The pt is on a daily aspirin.   Other AC:  none The pt is on ARB and BB for hypertension.   The pt is not diabetic.   Tobacco hx:  never   Past Medical History:  Diagnosis Date  . Anxiety   . Arthritis   . Carotid arterial disease ([place])   . Complication of anesthesia    "hallucinates for days; have a hard time waking up; almost couldn't get her awake last time; wake up wild" (05/26/2018)  . Constipation   . Degeneration of lumbar intervertebral disc   . Dislocation of hip joint prosthesis ([place])   . Fusion of spine of lumbar region   . Heart murmur    "slight one" (05/26/2018)  . Hyperlipidemia   . Hypertension   . Low back pain radiating to left lower extremity   . Melanoma ([place])    "center of upper chest"  . Osteoarthritis   . Osteopenia   . Sleep difficulties   . Spinal stenosis at L4-L5 level   . Stroke ([place])    tia--last week  . TIA (transient ischemic attack) 05/20/2018    Past Surgical History:  Procedure Laterality Date  . ANTERIOR LUMBAR FUSION     added rods and screws;  Marland Kitchen BACK SURGERY    . BREAST BIOPSY Left    "not cancer"  . BUNIONECTOMY Left   . CAROTID ENDARTERECTOMY Left 05/26/2018  . CATARACT EXTRACTION W/ INTRAOCULAR LENS  IMPLANT,  BILATERAL Bilateral   . ENDARTERECTOMY Left 05/26/2018   Procedure: ENDARTERECTOMY CAROTID LEFT;  Surgeon: Shannon Mould, MD;  Location: [facility];  Service: Vascular;  Laterality: Left;  . EYE SURGERY Right    "retina wrinkle OR; now can't see out of that eye" (05/26/2018)  . JOINT REPLACEMENT Left 2014   Hip  . LUMBAR SPINE HARDWARE REMOVAL     "went in thru the front"  . MELANOMA EXCISION     "center of upper chest"  . PATCH ANGIOPLASTY Left 05/26/2018   Procedure: PATCH ANGIOPLASTY LEFT CAROTID ARTERY;  Surgeon: Shannon Mould, MD;  Location: [facility];  Service: Vascular;  Laterality: Left;  . PILONIDAL CYST / SINUS EXCISION    . POSTERIOR LUMBAR FUSION  X 2  . TENDON REPAIR Left    Reattachment of tendon- Left hip  . THUMB FUSION Right   . TONSILLECTOMY AND ADENOIDECTOMY    . TOTAL HIP ARTHROPLASTY Left 09/04/2012   Procedure: LEFT TOTAL HIP ARTHROPLASTY ANTERIOR APPROACH;  Surgeon: Shannon Rossetti, MD;  Location[facility];  Service: Orthopedics;  Laterality: Left;  Left Total Hip Arthroplasty, Anterior Approach    Allergies  Allergen Reactions  .  Oxycodone-Acetaminophen Anaphylaxis  . Versed [Midazolam] Other (See Comments)    "makes me hyper and have hallucinations"  . Hydrocodone   . Hydrocodone-Acetaminophen Nausea And Vomiting  . Oxycodone     Current Outpatient Medications  Medication Sig Dispense Refill  . Abaloparatide (TYMLOS [place]) Inject 80 mcg into the skin daily.     Marland Kitchen ALPRAZolam (XANAX) 0.25 MG tablet Take 0.125-0.25 mg by mouth at bedtime as needed for sleep.     . Ascorbic Acid (VITAMIN C PO) Take 1,000 mg by mouth 2 (two) times daily.     Marland Kitchen aspirin EC 325 MG EC tablet Take 1 tablet (325 mg total) by mouth daily. 30 tablet 0  . atorvastatin (LIPITOR) 40 MG tablet Take 40 mg by mouth daily.  3  . bisoprolol (ZEBETA) 5 MG tablet Take 2 tablets (10 mg total) by mouth daily.    Marland Kitchen buPROPion (WELLBUTRIN XL) 300 MG 24 hr tablet Take 300 mg by mouth  daily.    Marland Kitchen CALCIUM PO Take 1,200 mg by mouth daily.     . Cholecalciferol (VITAMIN D) 2000 units tablet Take 2,000 Units by mouth as directed.    . ezetimibe (ZETIA) 10 MG tablet Take 10 mg by mouth daily.    . hydrochlorothiazide (HYDRODIURIL) 25 MG tablet Take 25 mg by mouth daily.    Marland Kitchen losartan (COZAAR) 50 MG tablet Take 50 mg by mouth daily.    . meloxicam (MOBIC) 15 MG tablet Take 15 mg by mouth as needed for pain.     . Multiple Vitamin (MULTIVITAMIN) tablet Take 1 tablet by mouth daily.      . Omega-3 Fatty Acids (FISH OIL PO) Take 1,400 mg by mouth 2 (two) times daily.     Marland Kitchen omeprazole (PRILOSEC) 20 MG capsule Take 20 mg by mouth daily.    Marland Kitchen senna (SENOKOT) 8.6 MG TABS tablet Take 1 tablet by mouth at bedtime as needed for mild constipation.    . traMADol (ULTRAM) 50 MG tablet Take 1 tablet (50 mg total) by mouth every 6 (six) hours as needed for moderate pain. 8 tablet 0  . venlafaxine (EFFEXOR) 25 MG tablet Take 75 mg by mouth daily.      No current facility-administered medications for this visit.     Family History  Problem Relation Age of Onset  . Heart failure Mother   . Cancer Mother        colon  . Hypertension Mother   . Other Mother        Varicose Veins  . Diabetes Father   . Heart disease Father   . Hypertension Father   . Heart attack Father   . Cancer Brother        squamos cell - head, neck, ear    Social History   Socioeconomic History  . Marital status: Married    Spouse name: Not on file  . Number of children: 2  . Years of education: Not on file  . Highest education level: Not on file  Occupational History  . 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 Financial resource strain: Not on file  . Food insecurity    Worry: Not on file    Inability: Not on file  . Transportation needs    Medical: Not on file    Non-medical: Not on file  Tobacco Use  . Smoking status: Never Smoker  . Smokeless  tobacco: Never  Used  Substance and Sexual Activity  . Alcohol use: Yes    Alcohol/week: 0.0 standard drinks    Comment: 05/26/2018 "nothing now; wine gives me headaches"  . Drug use: Never  . Sexual activity: Not on file    Comment: 1st intercourse- 23, partners- 1, married- 58 yrs   Lifestyle  . Physical activity    Days per week: Not on file    Minutes per session: Not on file  . Stress: Not on file  Relationships  . Social Herbalist on phone: Not on file    Gets together: Not on file    Attends religious service: Not on file    Active member of club or organization: Not on file    Attends meetings of clubs or organizations: Not on file    Relationship status: Not on file  . Intimate partner violence    Fear of current or ex partner: Not on file    Emotionally abused: Not on file    Physically abused: Not on file    Forced sexual activity: Not on file  Other Topics Concern  . Not on file  Social History Narrative  . Not on file     REVIEW OF SYSTEMS:   [X]  denotes positive finding, [ ]  denotes negative finding Cardiac  Comments:  Chest pain or chest pressure:    Shortness of breath upon exertion:    Short of breath when lying flat:    Irregular heart rhythm:        Vascular    Pain in calf, thigh, or hip brought on by ambulation:    Pain in feet at night that wakes you up from your sleep:     Blood clot in your veins:    Leg swelling:         Pulmonary    Oxygen at home:    Productive cough:     Wheezing:         Neurologic    Sudden weakness in arms or legs:     Sudden numbness in arms or legs:     Sudden onset of difficulty speaking or slurred speech:    Temporary loss of vision in one eye:     Problems with dizziness:         Gastrointestinal    Blood in stool:     Vomited blood:         Genitourinary    Burning when urinating:     Blood in urine:        Psychiatric    Major depression:         Hematologic    Bleeding problems:     Problems with blood clotting too easily:        Skin    Rashes or ulcers:        Constitutional    Fever or chills:      PHYSICAL EXAMINATION:  Today's Vitals   01/05/19 1322  BP: (!) 145/78  Pulse: 70  Resp: 14  Temp: 97.8 F (36.6 C)  TempSrc: Temporal  SpO2: 98%  Weight: 106 lb 3.2 oz (48.2 kg)  Height: 5\' 1"  (1.549 m)   Body mass index is 20.07 kg/m.   General:  WDWN in NAD; vital signs documented above Gait: Not observed HENT: WNL, normocephalic Pulmonary: normal non-labored breathing , without Rales, rhonchi,  wheezing Cardiac: regular HR, without  Murmurs, rubs or gallops; without carotid bruits Skin: without rashes Vascular Exam/Pulses:  Right Left  Radial 2+ (normal) 2+ (normal)  Ulnar Unable to palpate  Unable to palpate   DP Unable to palpate  Unable to palpate   PT Unable to palpate  Unable to palpate    Extremities: without ischemic changes, without Gangrene , without cellulitis; without open wounds;  Musculoskeletal: no muscle wasting or atrophy  Neurologic: A&O X 3 Psychiatric:  The pt has Normal affect.   Non-Invasive Vascular Imaging:   Carotid Duplex on 01/05/2019: Right:  1-39% ICA stenosis Left:  1-39% ICA stenosis Vertebrals:  Bilateral vertebral arteries demonstrate antegrade flow. Subclavians: Normal flow hemodynamics were seen in bilateral subclavian              arteries.  Previous Carotid duplex on 01/07/18: Right: 1-39% ICA stenosis Left:   60-79% ICA stenosis Vertebrals:  Bilateral vertebral arteries demonstrate antegrade flow. Subclavians: Normal flow hemodynamics were seen in bilateral subclavian arteries.   ASSESSMENT/PLAN:: 78 y.o. female here for follow up carotid artery stenosis.  She is s/p left CEA by Dr. Scot Berger on 05/26/18 for symptomatic carotid artery stenosis  -pt doing well since surgery.  Her incision has healed nicely.   -her duplex today reveals 1-39% carotid stenosis bilaterally -she has been  asymptomatic. -discussed s/s of stroke with pt and she understandsshould she develop any of these sx, they will go to the nearest ER. -continue statin/asa -f/u with Dr. Scot Berger in 6 months with carotid duplex.  If okay in 6 months, can f/u in one year with APP.    Shannon Locket, PA-C [facility] [phone]  Clinic MD:  Early

## 2019-01-05 ENCOUNTER — Other Ambulatory Visit: Payer: Self-pay

## 2019-01-05 ENCOUNTER — Ambulatory Visit (HOSPITAL_COMMUNITY)
Admission: RE | Admit: 2019-01-05 | Discharge: 2019-01-05 | Disposition: A | Discharge status: Home / Self Care | Payer: Medicare Other | Source: Ambulatory Visit | Attending: Family | Admitting: Family

## 2019-01-05 ENCOUNTER — Ambulatory Visit (INDEPENDENT_AMBULATORY_CARE_PROVIDER_SITE_OTHER): Payer: Medicare Other | Admitting: Physician Assistant

## 2019-01-05 VITALS — BP 145/78 | HR 70 | Temp 97.8°F | Resp 14 | Ht 61.0 in | Wt 106.2 lb

## 2019-01-05 DIAGNOSIS — I6523 Occlusion and stenosis of bilateral carotid arteries: Secondary | ICD-10-CM | POA: Insufficient documentation

## 2019-01-19 DIAGNOSIS — F329 Major depressive disorder, single episode, unspecified: Secondary | ICD-10-CM | POA: Diagnosis not present

## 2019-01-19 DIAGNOSIS — F419 Anxiety disorder, unspecified: Secondary | ICD-10-CM | POA: Diagnosis not present

## 2019-01-27 ENCOUNTER — Encounter: Payer: Self-pay | Admitting: Neurology

## 2019-02-15 DIAGNOSIS — H903 Sensorineural hearing loss, bilateral: Secondary | ICD-10-CM | POA: Diagnosis not present

## 2019-02-18 DIAGNOSIS — F419 Anxiety disorder, unspecified: Secondary | ICD-10-CM | POA: Diagnosis not present

## 2019-02-18 DIAGNOSIS — F329 Major depressive disorder, single episode, unspecified: Secondary | ICD-10-CM | POA: Diagnosis not present

## 2019-03-01 NOTE — Progress Notes (Signed)
Subjective:   Shannon Berger was seen in consultation in the movement disorder clinic at the request of Shannon Infante, MD.  The records that were made available to me were reviewed. The evaluation is for tremor.  Tremor is in the hands bilaterally.  Tremor started approximately 1+ years ago.    Tremor is most noticeable when using the hands.   There is a family hx of tremor in her mother in her later years.    Affected by caffeine:  (doesn't drink enough to know) Affected by alcohol:  (doesn't drink enough to know) Affected by stress:  Yes.   and does admit to increased stress lately, and wonders if she is having panic attacks lately because of stress (long intermittent hx of such) Affected by fatigue:  No. Spills soup if on spoon:  no Spills glass of liquid if full:  No. but may shake sometimes and other times it will not Affects ADL's (tying shoes, brushing teeth, etc):  No.   Current/Previously tried tremor medications: xanax (only takes it at bedtime);   Current medications that may exacerbate tremor:  n/a  Outside reports reviewed: historical medical records, office notes and referral letter/letters.  Allergies  Allergen Reactions  . Oxycodone-Acetaminophen Anaphylaxis  . Versed [Midazolam] Other (See Comments)    "makes me hyper and have hallucinations"  . Hydrocodone   . Hydrocodone-Acetaminophen Nausea And Vomiting  . Oxycodone     Current Outpatient Medications  Medication Instructions  . Abaloparatide (TYMLOS [place]) 80 mcg, Subcutaneous, Daily  . ALPRAZolam (XANAX) 0.125-0.25 mg, Oral, At bedtime PRN  . Ascorbic Acid (VITAMIN C PO) 1,000 mg, Oral, 2 times daily  . aspirin 325 mg, Oral, Daily  . atorvastatin (LIPITOR) 40 mg, Oral, Daily  . bisoprolol (ZEBETA) 10 mg, Oral, Daily  . buPROPion (WELLBUTRIN XL) 150 mg, Oral, Daily  . CALCIUM PO 1,200 mg, Oral, Daily  . ezetimibe (ZETIA) 10 mg, Oral, Daily  . hydrochlorothiazide (HYDRODIURIL) 25 mg, Oral, Daily,  Take 1/2 tablet  . losartan (COZAAR) 50 mg, Oral, Daily  . Multiple Vitamin (MULTIVITAMIN) tablet 1 tablet, Oral, Daily  . Omega-3 Fatty Acids (FISH OIL PO) 1,400 mg, Oral, 2 times daily  . omeprazole (PRILOSEC) 20 mg, Oral, Daily  . senna (SENOKOT) 8.6 MG TABS tablet 1 tablet, Oral, At bedtime PRN  . venlafaxine (EFFEXOR) 75 mg, Oral, Daily  . Vitamin D 2,000 Units, Oral, As directed    Past Medical History:  Diagnosis Date  . Anxiety   . Arthritis   . Carotid arterial disease ([place])   . Complication of anesthesia    "hallucinates for days; have a hard time waking up; almost couldn't get her awake last time; wake up wild" (05/26/2018)  . Constipation   . Degeneration of lumbar intervertebral disc   . Dislocation of hip joint prosthesis ([place])   . Fusion of spine of lumbar region   . Heart murmur    "slight one" (05/26/2018)  . Hyperlipidemia   . Hypertension   . Low back pain radiating to left lower extremity   . Melanoma ([place])    "center of upper chest"  . Osteoarthritis   . Osteopenia   . Sleep difficulties   . Spinal stenosis at L4-L5 level   . Stroke ([place])    tia--last week  . TIA (transient ischemic attack) 05/20/2018    Past Surgical History:  Procedure Laterality Date  . ANTERIOR LUMBAR FUSION     added rods and screws;  Marland Kitchen  BACK SURGERY    . BREAST BIOPSY Left    "not cancer"  . BUNIONECTOMY Left   . CAROTID ENDARTERECTOMY Left 05/26/2018  . CATARACT EXTRACTION W/ INTRAOCULAR LENS  IMPLANT, BILATERAL Bilateral   . ENDARTERECTOMY Left 05/26/2018   Procedure: ENDARTERECTOMY CAROTID LEFT;  Surgeon: Shannon Mould, MD;  Location: [facility];  Service: Vascular;  Laterality: Left;  . EYE SURGERY Right    "retina wrinkle OR; now can't see out of that eye" (05/26/2018)  . JOINT REPLACEMENT Left 2014   Hip  . LUMBAR SPINE HARDWARE REMOVAL     "went in thru the front"  . MELANOMA EXCISION     "center of upper chest"  . PATCH ANGIOPLASTY Left 05/26/2018    Procedure: PATCH ANGIOPLASTY LEFT CAROTID ARTERY;  Surgeon: Shannon Mould, MD;  Location: [facility];  Service: Vascular;  Laterality: Left;  . PILONIDAL CYST / SINUS EXCISION    . POSTERIOR LUMBAR FUSION  X 2  . TENDON REPAIR Left    Reattachment of tendon- Left hip  . THUMB FUSION Right   . TONSILLECTOMY AND ADENOIDECTOMY    . TOTAL HIP ARTHROPLASTY Left 09/04/2012   Procedure: LEFT TOTAL HIP ARTHROPLASTY ANTERIOR APPROACH;  Surgeon: Shannon Rossetti, MD;  Location: [facility];  Service: Orthopedics;  Laterality: Left;  Left Total Hip Arthroplasty, Anterior Approach    Social History   Socioeconomic History  . Marital status: Married    Spouse name: Shannon Berger  . Number of children: 2  . Years of education: Not on file  . Highest education level: Bachelor's degree (e.g., BA, AB, BS)  Occupational History  . 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 Financial resource strain: Not on file  . Food insecurity    Worry: Not on file    Inability: Not on file  . Transportation needs    Medical: Not on file    Non-medical: Not on file  Tobacco Use  . Smoking status: Never Smoker  . Smokeless tobacco: Never Used  Substance and Sexual Activity  . Alcohol use: Yes    Alcohol/week: 0.0 standard drinks    Comment: 05/26/2018 "nothing now; wine gives me headaches"  . Drug use: Never  . Sexual activity: Not on file    Comment: 1st intercourse- 23, partners- 1, married- 9 yrs   Lifestyle  . Physical activity    Days per week: Not on file    Minutes per session: Not on file  . Stress: Not on file  Relationships  . Social Herbalist on phone: Not on file    Gets together: Not on file    Attends religious service: Not on file    Active member of club or organization: Not on file    Attends meetings of clubs or organizations: Not on file    Relationship status: Not on file  . Intimate partner violence    Fear of current or  ex partner: Not on file    Emotionally abused: Not on file    Physically abused: Not on file    Forced sexual activity: Not on file  Other Topics Concern  . Not on file  Social History Narrative   Pt lives at home with spouse Shannon Berger, 2 story home has 2 children   She is right handed, she drinks ginger ale and come tea, no coffee, or soda    Family Status  Relation Name  Status  . Mother  Deceased at age 92       congestive heart failure  . Father  Deceased at age 26       Myocardial infarction  . Brother  Alive       In good health  . MGM  Deceased  . MGF  Deceased  . PGM  Deceased  . PGF  Deceased  . Son  Alive  . Daughter  Alive    Review of Systems Review of Systems  Constitutional: Positive for weight loss (attributes to stress and lack of eating).  HENT: Negative.   Eyes: Positive for blurred vision (R eye, due to wrinkled retina).  Respiratory: Positive for shortness of breath (with panic attacks).   Cardiovascular: Positive for chest pain (intermittent, follows with cardiology).  Gastrointestinal: Negative.   Genitourinary: Negative.   Musculoskeletal: Negative.   Skin: Negative.   Neurological: Positive for weakness (attributes to low BP (high today) and not eating enough).     Objective:   VITALS:   Vitals:   03/02/19 0953  BP: (!) 146/69  Pulse: 74  SpO2: 96%  Weight: 104 lb (47.2 kg)  Height: 5\' 2"  (1.575 m)   Gen:  Appears stated age and in NAD. HEENT:  Normocephalic, atraumatic. The mucous membranes are moist. The superficial temporal arteries are without ropiness or tenderness. Cardiovascular: Regular rate and rhythm. Lungs: Clear to auscultation bilaterally. Neck: There are no carotid bruits noted bilaterally. Skin: The hardware from her prior lumbar surgeries is clearly evident just under the skin (patient states that she has been back to surgeon and he does not want to remove).  NEUROLOGICAL:  Orientation:  The patient is alert and oriented  x 3.  Recent and remote memory are intact.  Attention span and concentration are normal.  Able to name objects and repeat without trouble.  Fund of knowledge is appropriate Cranial nerves: There is good facial symmetry. . Extraocular muscles are intact and visual fields are full to confrontational testing. Speech is fluent and clear. Soft palate rises symmetrically and there is no tongue deviation. Hearing is intact to conversational tone. Tone: Tone is good throughout. Sensation: Sensation is intact to light touch and pinprick throughout (facial, trunk, extremities). Vibration is intact at the bilateral big toe. There is no extinction with double simultaneous stimulation. There is no sensory dermatomal level identified. Coordination:  The patient has no dysdiadichokinesia or dysmetria. Motor: Strength is 5/5 in the bilateral upper and lower extremities.  Shoulder shrug is equal bilaterally.  There is no pronator drift.  There are no fasciculations noted. DTR's: Deep tendon reflexes are 3+/4 at the bilateral biceps, triceps, brachioradialis, patella and achilles.  Plantar responses are downgoing bilaterally. Gait and Station: The patient is able to ambulate without difficulty.   MOVEMENT EXAM: Tremor:  There is no rest tremor.  There is postural tremor, much worse on the L when given a weight.  It is mod on the L and mild on the right.   Mild trouble with archimedes spirals bilaterally.  she has very mild difficulty when asked to pour a full glass of water from one glass to another.  There is head tremor in the "no" direction.     Chemistry      Component Value Date/Time   NA 139 05/27/2018 0322   NA 142 05/13/2018 1155   K 3.7 05/27/2018 0322   CL 108 05/27/2018 0322   CO2 21 (L) 05/27/2018 0322   BUN 18 05/27/2018  0322   BUN 20 05/13/2018 1155   CREATININE 1.14 (H) 05/27/2018 0322   GLU 94 11/23/2015      Component Value Date/Time   CALCIUM 8.9 05/27/2018 0322   ALKPHOS 123 05/20/2018  1833   AST 36 05/20/2018 1833   ALT 27 05/20/2018 1833   BILITOT 0.7 05/20/2018 1833       records reviewed from primary care.  This was dated August 05, 2018.  Sodium was 142, potassium 4.4, chloride 105, CO2 27, BUN 34, creatinine 1.3, glucose 88.  White blood cells were 8.9, hemoglobin 11.3, hematocrit 35.3 and platelets 245.  AST 28, ALT 18.  TSH was 0.95.   Assessment/Plan:   1.  Essential Tremor.  -  We discussed nature and pathophysiology.  We discussed that this can continue to gradually get worse with time.  We discussed that some medications can worsen this, as can caffeine use.  We discussed medication therapy as well as surgical therapy.  Ultimately, the patient decided to hold off on further medications..    2.  Depression, anxiety and panic attacks  -following with counseling within Dr. Mylinda Latina office  -needs Psychiatry - told by Dr. Joylene Draft and myself today.  She states that she will follow through  -Discussed that the anxiety can make tremor worse.  3.  Hyperreflexia  -no evidence of myelopathy  -no neck pain  -no focal/lateralizing s/s  -decided to hold off on further neuroimaging  4.  History of back surgeries, with evidence of hardware coming through toward the skin  -Patient states that she has followed up with neurosurgery and they do not want to do further surgery to remove hardware.  I have reviewed [facility]opedics records from [facility] and they mention prominence of the proximal hardware, but nothing about surgical intervention.  5.  F/u prn  CC:  Shannon Infante, MD

## 2019-03-02 ENCOUNTER — Ambulatory Visit (INDEPENDENT_AMBULATORY_CARE_PROVIDER_SITE_OTHER): Payer: Medicare Other | Admitting: Neurology

## 2019-03-02 ENCOUNTER — Encounter: Payer: Self-pay | Admitting: Neurology

## 2019-03-02 ENCOUNTER — Other Ambulatory Visit: Payer: Self-pay

## 2019-03-02 VITALS — BP 146/69 | HR 74 | Ht 62.0 in | Wt 104.0 lb

## 2019-03-02 DIAGNOSIS — G250 Essential tremor: Secondary | ICD-10-CM | POA: Diagnosis not present

## 2019-03-02 DIAGNOSIS — F411 Generalized anxiety disorder: Secondary | ICD-10-CM | POA: Diagnosis not present

## 2019-03-02 DIAGNOSIS — R292 Abnormal reflex: Secondary | ICD-10-CM

## 2019-03-02 DIAGNOSIS — F410 Panic disorder [episodic paroxysmal anxiety] without agoraphobia: Secondary | ICD-10-CM

## 2019-03-02 NOTE — Patient Instructions (Signed)
You have essential tremor.  We discussed various medications, and ultimately decided to hold off on this right now.  You can let me know if you change your mind.  As a reminder, we have discussed that anxiety can make this worse and I have recommended that you see a psychiatrist to discuss your medications and see if this can be optimized.  It was my pleasure seeing you today!  The physicians and staff at [facility] are committed to providing excellent care. You may receive a survey requesting feedback about your experience at our office. We strive to receive "very good" responses to the survey questions. If you feel that your experience would prevent you from giving the office a "very good " response, please contact our office to try to remedy the situation. We may be reached at [phone]. Thank you for taking the time out of your busy day to complete the survey.

## 2019-03-12 DIAGNOSIS — I951 Orthostatic hypotension: Secondary | ICD-10-CM | POA: Diagnosis not present

## 2019-03-12 DIAGNOSIS — N183 Chronic kidney disease, stage 3 (moderate): Secondary | ICD-10-CM | POA: Diagnosis not present

## 2019-03-12 DIAGNOSIS — R42 Dizziness and giddiness: Secondary | ICD-10-CM | POA: Diagnosis not present

## 2019-03-12 DIAGNOSIS — I129 Hypertensive chronic kidney disease with stage 1 through stage 4 chronic kidney disease, or unspecified chronic kidney disease: Secondary | ICD-10-CM | POA: Diagnosis not present

## 2019-03-17 DIAGNOSIS — H903 Sensorineural hearing loss, bilateral: Secondary | ICD-10-CM | POA: Insufficient documentation

## 2019-03-17 DIAGNOSIS — H6123 Impacted cerumen, bilateral: Secondary | ICD-10-CM | POA: Diagnosis not present

## 2019-03-17 DIAGNOSIS — M5416 Radiculopathy, lumbar region: Secondary | ICD-10-CM | POA: Diagnosis not present

## 2019-03-17 DIAGNOSIS — M4325 Fusion of spine, thoracolumbar region: Secondary | ICD-10-CM | POA: Diagnosis not present

## 2019-03-18 DIAGNOSIS — I951 Orthostatic hypotension: Secondary | ICD-10-CM | POA: Diagnosis not present

## 2019-03-23 DIAGNOSIS — F419 Anxiety disorder, unspecified: Secondary | ICD-10-CM | POA: Diagnosis not present

## 2019-03-23 DIAGNOSIS — F329 Major depressive disorder, single episode, unspecified: Secondary | ICD-10-CM | POA: Diagnosis not present

## 2019-03-26 DIAGNOSIS — F331 Major depressive disorder, recurrent, moderate: Secondary | ICD-10-CM | POA: Diagnosis not present

## 2019-03-26 DIAGNOSIS — F411 Generalized anxiety disorder: Secondary | ICD-10-CM | POA: Diagnosis not present

## 2019-04-16 DIAGNOSIS — F331 Major depressive disorder, recurrent, moderate: Secondary | ICD-10-CM | POA: Diagnosis not present

## 2019-04-16 DIAGNOSIS — F411 Generalized anxiety disorder: Secondary | ICD-10-CM | POA: Diagnosis not present

## 2019-04-23 DIAGNOSIS — F329 Major depressive disorder, single episode, unspecified: Secondary | ICD-10-CM | POA: Diagnosis not present

## 2019-04-23 DIAGNOSIS — F419 Anxiety disorder, unspecified: Secondary | ICD-10-CM | POA: Diagnosis not present

## 2019-04-29 DIAGNOSIS — E7849 Other hyperlipidemia: Secondary | ICD-10-CM | POA: Diagnosis not present

## 2019-04-29 DIAGNOSIS — D539 Nutritional anemia, unspecified: Secondary | ICD-10-CM | POA: Diagnosis not present

## 2019-04-29 DIAGNOSIS — M810 Age-related osteoporosis without current pathological fracture: Secondary | ICD-10-CM | POA: Diagnosis not present

## 2019-04-29 DIAGNOSIS — I10 Essential (primary) hypertension: Secondary | ICD-10-CM | POA: Diagnosis not present

## 2019-05-03 DIAGNOSIS — D649 Anemia, unspecified: Secondary | ICD-10-CM | POA: Diagnosis not present

## 2019-05-04 DIAGNOSIS — R82998 Other abnormal findings in urine: Secondary | ICD-10-CM | POA: Diagnosis not present

## 2019-05-06 DIAGNOSIS — I951 Orthostatic hypotension: Secondary | ICD-10-CM | POA: Diagnosis not present

## 2019-05-06 DIAGNOSIS — Z0000 Encounter for general adult medical examination without abnormal findings: Secondary | ICD-10-CM | POA: Diagnosis not present

## 2019-05-06 DIAGNOSIS — I129 Hypertensive chronic kidney disease with stage 1 through stage 4 chronic kidney disease, or unspecified chronic kidney disease: Secondary | ICD-10-CM | POA: Diagnosis not present

## 2019-05-06 DIAGNOSIS — R634 Abnormal weight loss: Secondary | ICD-10-CM | POA: Diagnosis not present

## 2019-05-06 DIAGNOSIS — Z1331 Encounter for screening for depression: Secondary | ICD-10-CM | POA: Diagnosis not present

## 2019-05-07 DIAGNOSIS — Z1212 Encounter for screening for malignant neoplasm of rectum: Secondary | ICD-10-CM | POA: Diagnosis not present

## 2019-05-18 ENCOUNTER — Other Ambulatory Visit: Payer: Self-pay

## 2019-05-19 ENCOUNTER — Ambulatory Visit: Payer: Medicare Other | Admitting: Obstetrics & Gynecology

## 2019-05-19 ENCOUNTER — Encounter: Payer: Self-pay | Admitting: Obstetrics & Gynecology

## 2019-05-19 VITALS — BP 138/84 | Ht 60.0 in | Wt 106.0 lb

## 2019-05-19 DIAGNOSIS — Z780 Asymptomatic menopausal state: Secondary | ICD-10-CM

## 2019-05-19 DIAGNOSIS — Z01419 Encounter for gynecological examination (general) (routine) without abnormal findings: Secondary | ICD-10-CM

## 2019-05-19 DIAGNOSIS — N393 Stress incontinence (female) (male): Secondary | ICD-10-CM

## 2019-05-19 DIAGNOSIS — M810 Age-related osteoporosis without current pathological fracture: Secondary | ICD-10-CM

## 2019-05-19 NOTE — Progress Notes (Signed)
Shannon Berger [birth year] [id number]   History:    78 y.o. G2P2L2 Married  RP:  Established patient presenting for annual gyn exam   HPI: Menopause, well on no HRT.  No PMB.  No pelvic pain.  Rarely sexually active, uncomfortable.  Coconut oil recommended for IC.  SUI stable, wears a pad.  Tendency for constipation, on Senokot.  Breasts wnl.  BMI 20.70.  Water fitness helping, because of severe back pain/back surgeries (2), but stopped because of COVID, will restart.  Health labs with Fam MD.  Past medical history,surgical history, family history and social history were all reviewed and documented in the EPIC chart.  Gynecologic History No LMP recorded. Patient is postmenopausal. Contraception: post menopausal status Last Pap: 03/2017. Results were: Negative Last mammogram: 11/2018. Results were: Benign Bone Density: 2019 Colonoscopy: 2015  Obstetric History OB History  Gravida Para Term Preterm AB Living  2 2       2   SAB TAB Ectopic Multiple Live Births               # Outcome Date GA Lbr Len/2nd Weight Sex Delivery Anes PTL Lv  2 Para           1 Para              ROS: A ROS was performed and pertinent positives and negatives are included in the history.  GENERAL: No fevers or chills. HEENT: No change in vision, no earache, sore throat or sinus congestion. NECK: No pain or stiffness. CARDIOVASCULAR: No chest pain or pressure. No palpitations. PULMONARY: No shortness of breath, cough or wheeze. GASTROINTESTINAL: No abdominal pain, nausea, vomiting or diarrhea, melena or bright red blood per rectum. GENITOURINARY: No urinary frequency, urgency, hesitancy or dysuria. MUSCULOSKELETAL: No joint or muscle pain, no back pain, no recent trauma. DERMATOLOGIC: No rash, no itching, no lesions. ENDOCRINE: No polyuria, polydipsia, no heat or cold intolerance. No recent change in weight. HEMATOLOGICAL: No anemia or easy bruising or bleeding. NEUROLOGIC: No headache, seizures,  numbness, tingling or weakness. PSYCHIATRIC: No depression, no loss of interest in normal activity or change in sleep pattern.     Exam:   BP 138/84   Ht 5' (1.524 m)   Wt 106 lb (48.1 kg)   BMI 20.70 kg/m   Body mass index is 20.7 kg/m.  General appearance : Well developed well nourished female. No acute distress HEENT: Eyes: no retinal hemorrhage or exudates,  Neck supple, trachea midline, no carotid bruits, no thyroidmegaly Lungs: Clear to auscultation, no rhonchi or wheezes, or rib retractions  Heart: Regular rate and rhythm, no murmurs or gallops Breast:Examined in sitting and supine position were symmetrical in appearance, no palpable masses or tenderness,  no skin retraction, no nipple inversion, no nipple discharge, no skin discoloration, no axillary or supraclavicular lymphadenopathy Abdomen: no palpable masses or tenderness, no rebound or guarding Extremities: no edema or skin discoloration or tenderness  Pelvic: Vulva: Normal             Vagina: No gross lesions or discharge  Cervix: No gross lesions or discharge  Uterus  AV, normal size, shape and consistency, non-tender and mobile  Adnexa  Without masses or tenderness  Anus: Normal   Assessment/Plan:  78 y.o. female for annual exam   1. Well female exam with routine gynecological exam Normal gynecologic exam.  Pap negative in 03/2017, no indication to repeat this year.  Breasts normal.  Screening mammo benign 11/2018.  [place]  2015.  Health labs Fam MD.  Good BMI 20.7.  Continue fitness and healthy nutrition.  2. Post-menopause Well on no HRT.  No PMB.  3. Age-related osteoporosis without current pathological fracture Followed by Dr Joylene Draft.  On Tymlos 80 mg [place] daily.  Vit D supplements, Ca++ 1200 mg daily and regular weightbearing physical activities.  Last BD 2019.  4. SUI (stress urinary incontinence, female) Recommend avoiding overfilling of the bladder.  Continue with Kegel exercises.  Will refer to PT for  Pelvic floor strengthening as needed.  Other orders - desvenlafaxine (PRISTIQ) 100 MG 24 hr tablet; Take 100 mg by mouth daily. - amLODipine (NORVASC) 2.5 MG tablet; Take 2.5 mg by mouth daily.  Princess Bruins MD, 3:39 PM 05/19/2019

## 2019-05-26 ENCOUNTER — Encounter: Payer: Self-pay | Admitting: Obstetrics & Gynecology

## 2019-05-26 NOTE — Patient Instructions (Signed)
1. Well female exam with routine gynecological exam Normal gynecologic exam.  Pap negative in 03/2017, no indication to repeat this year.  Breasts normal.  Screening mammo benign 11/2018.  [place] 2015.  Health labs Fam MD.  Good BMI 20.7.  Continue fitness and healthy nutrition.  2. Post-menopause Well on no HRT.  No PMB.  3. Age-related osteoporosis without current pathological fracture Followed by Dr Joylene Draft.  On Tymlos 80 mg Shannon Berger daily.  Vit D supplements, Ca++ 1200 mg daily and regular weightbearing physical activities.  Last BD 2019.  4. SUI (stress urinary incontinence, female) Recommend avoiding overfilling of the bladder.  Continue with Kegel exercises.  Will refer to PT for Pelvic floor strengthening as needed.  Other orders - desvenlafaxine (PRISTIQ) 100 MG 24 hr tablet; Take 100 mg by mouth daily. - amLODipine (NORVASC) 2.5 MG tablet; Take 2.5 mg by mouth daily.  Shannon Berger, it was a pleasure seeing you today!

## 2019-06-08 DIAGNOSIS — F331 Major depressive disorder, recurrent, moderate: Secondary | ICD-10-CM | POA: Diagnosis not present

## 2019-06-08 DIAGNOSIS — F411 Generalized anxiety disorder: Secondary | ICD-10-CM | POA: Diagnosis not present

## 2019-06-22 DIAGNOSIS — F419 Anxiety disorder, unspecified: Secondary | ICD-10-CM | POA: Diagnosis not present

## 2019-06-24 DIAGNOSIS — Z961 Presence of intraocular lens: Secondary | ICD-10-CM | POA: Diagnosis not present

## 2019-06-24 DIAGNOSIS — H47011 Ischemic optic neuropathy, right eye: Secondary | ICD-10-CM | POA: Diagnosis not present

## 2019-06-24 DIAGNOSIS — H35371 Puckering of macula, right eye: Secondary | ICD-10-CM | POA: Diagnosis not present

## 2019-07-13 ENCOUNTER — Other Ambulatory Visit: Payer: Self-pay

## 2019-07-13 DIAGNOSIS — I6523 Occlusion and stenosis of bilateral carotid arteries: Secondary | ICD-10-CM

## 2019-07-14 ENCOUNTER — Ambulatory Visit: Payer: Medicare Other | Admitting: Vascular Surgery

## 2019-07-14 ENCOUNTER — Encounter: Payer: Self-pay | Admitting: Vascular Surgery

## 2019-07-14 ENCOUNTER — Other Ambulatory Visit: Payer: Self-pay

## 2019-07-14 ENCOUNTER — Ambulatory Visit (HOSPITAL_COMMUNITY)
Admission: RE | Admit: 2019-07-14 | Discharge: 2019-07-14 | Disposition: A | Discharge status: Home / Self Care | Payer: Medicare Other | Source: Ambulatory Visit | Attending: Family | Admitting: Family

## 2019-07-14 VITALS — BP 120/73 | HR 55 | Resp 20 | Ht 60.0 in | Wt 102.0 lb

## 2019-07-14 DIAGNOSIS — I6523 Occlusion and stenosis of bilateral carotid arteries: Secondary | ICD-10-CM | POA: Insufficient documentation

## 2019-07-14 DIAGNOSIS — Z9889 Other specified postprocedural states: Secondary | ICD-10-CM | POA: Diagnosis not present

## 2019-07-14 DIAGNOSIS — I6522 Occlusion and stenosis of left carotid artery: Secondary | ICD-10-CM | POA: Diagnosis not present

## 2019-07-14 NOTE — Progress Notes (Signed)
Patient name: Shannon Berger MRN: [id number] DOB: 09-Jul-1940 Sex: female  REASON FOR VISIT:   Follow-up after left carotid endarterectomy  HPI:   Shannon Berger is a pleasant 79 y.o. female who underwent a left carotid endarterectomy on 05/26/2018 for symptomatic left carotid stenosis.  She comes in for yearly follow-up visit.  Since I saw her last, she denies any history of stroke, TIAs, expressive or receptive aphasia, or amaurosis fugax.  She is on aspirin and is on a statin.  Her activity is limited by her hip arthritis.  Past Medical History:  Diagnosis Date  . Anxiety   . Arthritis   . Carotid arterial disease ([place])   . Complication of anesthesia    "hallucinates for days; have a hard time waking up; almost couldn't get her awake last time; wake up wild" (05/26/2018)  . Constipation   . Degeneration of lumbar intervertebral disc   . Dislocation of hip joint prosthesis ([place])   . Essential tremor   . Fusion of spine of lumbar region   . Heart murmur    "slight one" (05/26/2018)  . Hyperlipidemia   . Hypertension   . Low back pain radiating to left lower extremity   . Melanoma ([place])    "center of upper chest"  . Osteoarthritis   . Osteopenia   . Sleep difficulties   . Spinal stenosis at L4-L5 level   . Stroke ([place])    tia--last week  . TIA (transient ischemic attack) 05/20/2018    Family History  Problem Relation Age of Onset  . Heart failure Mother   . Cancer Mother        colon  . Hypertension Mother   . Other Mother        Varicose Veins  . Diabetes Father   . Heart disease Father   . Hypertension Father   . Heart attack Father   . Cancer Brother        squamos cell - head, neck, ear  . Healthy Son   . Migraines Daughter     SOCIAL HISTORY: Social History   Tobacco Use  . Smoking status: Never Smoker  . Smokeless tobacco: Never Used  Substance Use Topics  . Alcohol use: Yes    Alcohol/week: 0.0 standard drinks    Comment:  Occas.    Allergies  Allergen Reactions  . Oxycodone-Acetaminophen Anaphylaxis  . Versed [Midazolam] Other (See Comments)    "makes me hyper and have hallucinations"  . Hydrocodone   . Hydrocodone-Acetaminophen Nausea And Vomiting  . Oxycodone     Current Outpatient Medications  Medication Sig Dispense Refill  . Abaloparatide (TYMLOS [place]) Inject 80 mcg into the skin daily.     Marland Kitchen ALPRAZolam (XANAX) 0.5 MG tablet Take 0.25 mg by mouth at bedtime.     Marland Kitchen amLODipine (NORVASC) 5 MG tablet Take 5 mg by mouth at bedtime.    . Ascorbic Acid (VITAMIN C PO) Take 1,000 mg by mouth 2 (two) times daily.     Marland Kitchen aspirin EC 325 MG EC tablet Take 1 tablet (325 mg total) by mouth daily. 30 tablet 0  . atorvastatin (LIPITOR) 40 MG tablet Take 40 mg by mouth daily.  3  . bisoprolol (ZEBETA) 5 MG tablet Take 2 tablets (10 mg total) by mouth daily.    Marland Kitchen buPROPion (WELLBUTRIN XL) 300 MG 24 hr tablet Take 300 mg by mouth daily.     Marland Kitchen CALCIUM PO Take 1,200 mg by mouth daily.     Marland Kitchen  Cholecalciferol (VITAMIN D) 2000 units tablet Take 2,000 Units by mouth as directed.    . desvenlafaxine (PRISTIQ) 100 MG 24 hr tablet Take 100 mg by mouth daily.    Marland Kitchen ezetimibe (ZETIA) 10 MG tablet Take 10 mg by mouth daily.    . hydrochlorothiazide (HYDRODIURIL) 25 MG tablet Take 12.5 mg by mouth once a week. Take 1/2 tablet    . Multiple Vitamin (MULTIVITAMIN) tablet Take 1 tablet by mouth daily.      . Omega-3 Fatty Acids (FISH OIL PO) Take 1,400 mg by mouth 2 (two) times daily.     Marland Kitchen omeprazole (PRILOSEC) 20 MG capsule Take 20 mg by mouth daily.    Marland Kitchen senna (SENOKOT) 8.6 MG TABS tablet Take 1 tablet by mouth at bedtime as needed for mild constipation.     No current facility-administered medications for this visit.    REVIEW OF SYSTEMS:  [X]  denotes positive finding, [ ]  denotes negative finding Cardiac  Comments:  Chest pain or chest pressure: x stable  Shortness of breath upon exertion:    Short of breath when lying  flat:    Irregular heart rhythm:        Vascular    Pain in calf, thigh, or hip brought on by ambulation:    Pain in feet at night that wakes you up from your sleep:     Blood clot in your veins:    Leg swelling:         Pulmonary    Oxygen at home:    Productive cough:     Wheezing:         Neurologic    Sudden weakness in arms or legs:     Sudden numbness in arms or legs:     Sudden onset of difficulty speaking or slurred speech:    Temporary loss of vision in one eye:     Problems with dizziness:         Gastrointestinal    Blood in stool:     Vomited blood:         Genitourinary    Burning when urinating:     Blood in urine:        Psychiatric    Major depression:         Hematologic    Bleeding problems:    Problems with blood clotting too easily:        Skin    Rashes or ulcers:        Constitutional    Fever or chills:     PHYSICAL EXAM:   Vitals:   07/14/19 1240 07/14/19 1243  BP: 116/72 120/73  Pulse: (!) 55   Resp: 20   SpO2: 100%   Weight: 102 lb (46.3 kg)   Height: 5' (1.524 m)     GENERAL: The patient is a well-nourished female, in no acute distress. The vital signs are documented above. CARDIAC: There is a regular rate and rhythm.  VASCULAR: I do not detect carotid bruits. Both feet are warm and well perfused. She has no significant lower extremity swelling. PULMONARY: There is good air exchange bilaterally without wheezing or rales. ABDOMEN: Soft and non-tender with normal pitched bowel sounds.  MUSCULOSKELETAL: There are no major deformities or cyanosis. NEUROLOGIC: No focal weakness or paresthesias are detected. SKIN: There are no ulcers or rashes noted. PSYCHIATRIC: The patient has a normal affect.  DATA:    CAROTID DUPLEX: I have independently interpreted her carotid duplex scan.  On the left side, the left carotid endarterectomy site is widely patent.  There is no evidence of restenosis.  The left vertebral artery is patent  with antegrade flow.  On the right side there is a less than 39% stenosis.  The left vertebral artery is patent with antegrade flow.  MEDICAL ISSUES:   STATUS POST LEFT CAROTID ENDARTERECTOMY: Patient is doing well status post left carotid endarterectomy.  The left carotid endarterectomy site is widely patent with no significant stenosis on the right side either.  She is asymptomatic.  She is on aspirin and is on a statin.  She is not a smoker.  I have encouraged her to stay as active as possible however her activity is limited by her hip arthritis.  I have ordered a follow-up carotid duplex scan in 1 year and I will see her back at that time.  She knows to call sooner if she has problems.  [name] [facility] [phone]

## 2019-07-19 ENCOUNTER — Other Ambulatory Visit: Payer: Self-pay | Admitting: *Deleted

## 2019-07-19 DIAGNOSIS — I6523 Occlusion and stenosis of bilateral carotid arteries: Secondary | ICD-10-CM

## 2019-07-25 NOTE — Progress Notes (Signed)
Cardiology Office Note   Date:  07/26/2019   ID:  Shannon Berger, [place] [birth year], MRN [id number]  PCP:  [name], MD  Cardiologist:  Peter Martinique, MD EP: None  Chief Complaint  Patient presents with  . Follow-up    CAD and HTN      History of Present Illness: AHNYLA Shannon Berger is a 79 y.o. female with a PMH of non-obstructive CAD on Coronary CTA, HTN, HLD, Carotid artery disease s/p L CEA 2019, CVA, ischemic optic neuropathy, and osteopenia, who presents for routine follow-up.  She was last evaluated by cardiology at an outpatient visit with Dr. Martinique 07/13/2018, at which time she was doing well from a cardiac standpoint. She reported improvement in her DOE at that visit, as well as occasional chronic sharp chest pains which were unchanged. No medication changes occurred and she was recommended to follow-up in 1 year. Her last ischemic evaluation was a Coronary CTA 05/2018 which showed mild plaque in the pLAD and pRCA with a calcium score of 260 placing her in the 73rd percentile for age/gender (intermediate risk for future cardiac events). Echo 05/2018 showed EF 60-65%, G2DD, no RWMA, and mild MR.   She presents today for routine follow-up. She reports doing fairly well from a cardiac standpoint. She has an occasional ache in her chest which lasts for seconds and occurs without rhyme or reason. She feels this is related to stress/anxiety. She reports 2020 was a stressful year. She continues to have problems with her back and hip which limits activity. She denies SOB, DOE, LE edema, dizziness, lightheadedness, or syncope. She has been working on her blood pressure control with her PCP and was recently started on amlodipine 5mg  daily 06/2019. Her BP has typically been in the 140s/80s over the past 2 weeks.    Past Medical History:  Diagnosis Date  . Anxiety   . Arthritis   . Carotid arterial disease ([place])   . Complication of anesthesia    "hallucinates for days; have a  hard time waking up; almost couldn't get her awake last time; wake up wild" (05/26/2018)  . Constipation   . Degeneration of lumbar intervertebral disc   . Dislocation of hip joint prosthesis ([place])   . Essential tremor   . Fusion of spine of lumbar region   . Heart murmur    "slight one" (05/26/2018)  . Hyperlipidemia   . Hypertension   . Low back pain radiating to left lower extremity   . Melanoma ([place])    "center of upper chest"  . Osteoarthritis   . Osteopenia   . Sleep difficulties   . Spinal stenosis at L4-L5 level   . Stroke ([place])    tia--last week  . TIA (transient ischemic attack) 05/20/2018    Past Surgical History:  Procedure Laterality Date  . ANTERIOR LUMBAR FUSION     added rods and screws;  Marland Kitchen BACK SURGERY    . BREAST BIOPSY Left    "not cancer"  . BUNIONECTOMY Left   . CAROTID ENDARTERECTOMY Left 05/26/2018  . CATARACT EXTRACTION W/ INTRAOCULAR LENS  IMPLANT, BILATERAL Bilateral   . ENDARTERECTOMY Left 05/26/2018   Procedure: ENDARTERECTOMY CAROTID LEFT;  Surgeon: Angelia Mould, MD;  Location: [facility];  Service: Vascular;  Laterality: Left;  . EYE SURGERY Right    "retina wrinkle OR; now can't see out of that eye" (05/26/2018)  . JOINT REPLACEMENT Left 2014   Hip  . LUMBAR SPINE HARDWARE REMOVAL     "  went in thru the front"  . MELANOMA EXCISION     "center of upper chest"  . PATCH ANGIOPLASTY Left 05/26/2018   Procedure: PATCH ANGIOPLASTY LEFT CAROTID ARTERY;  Surgeon: Angelia Mould, MD;  Location: [facility];  Service: Vascular;  Laterality: Left;  . PILONIDAL CYST / SINUS EXCISION    . POSTERIOR LUMBAR FUSION  X 2  . TENDON REPAIR Left    Reattachment of tendon- Left hip  . THUMB FUSION Right   . TONSILLECTOMY AND ADENOIDECTOMY    . TOTAL HIP ARTHROPLASTY Left 09/04/2012   Procedure: LEFT TOTAL HIP ARTHROPLASTY ANTERIOR APPROACH;  Surgeon: Mcarthur Rossetti, MD;  Location: [facility];  Service: Orthopedics;  Laterality: Left;  Left Total  Hip Arthroplasty, Anterior Approach     Current Outpatient Medications  Medication Sig Dispense Refill  . Abaloparatide (TYMLOS [place]) Inject 80 mcg into the skin daily.     Marland Kitchen ALPRAZolam (XANAX) 0.5 MG tablet Take 0.25 mg by mouth at bedtime as needed.     Marland Kitchen amLODipine (NORVASC) 10 MG tablet Take 1 tablet (10 mg total) by mouth at bedtime. 90 tablet 3  . Ascorbic Acid (VITAMIN C PO) Take 1,000 mg by mouth 2 (two) times daily.     Marland Kitchen aspirin EC 325 MG EC tablet Take 1 tablet (325 mg total) by mouth daily. 30 tablet 0  . atorvastatin (LIPITOR) 40 MG tablet Take 40 mg by mouth daily.  3  . bisoprolol (ZEBETA) 10 MG tablet Take 10 mg by mouth daily.    Marland Kitchen buPROPion (WELLBUTRIN XL) 300 MG 24 hr tablet Take 300 mg by mouth daily.     Marland Kitchen CALCIUM PO Take 1,200 mg by mouth daily.     . Cholecalciferol (VITAMIN D) 2000 units tablet Take 2,000 Units by mouth as directed.    . desvenlafaxine (PRISTIQ) 100 MG 24 hr tablet Take 100 mg by mouth daily.    Marland Kitchen ezetimibe (ZETIA) 10 MG tablet Take 10 mg by mouth daily.    . hydrochlorothiazide (HYDRODIURIL) 25 MG tablet Take 12.5 mg by mouth once a week. Take 1/2 tablet    . Multiple Vitamin (MULTIVITAMIN) tablet Take 1 tablet by mouth daily.      . Omega-3 Fatty Acids (FISH OIL PO) Take 1,400 mg by mouth 2 (two) times daily.     Marland Kitchen senna (SENOKOT) 8.6 MG TABS tablet Take 1 tablet by mouth at bedtime as needed for mild constipation.    Marland Kitchen zinc gluconate 50 MG tablet Take 50 mg by mouth daily.     No current facility-administered medications for this visit.    Allergies:   Oxycodone-acetaminophen, Versed [midazolam], Hydrocodone, Hydrocodone-acetaminophen, and Oxycodone    Social History:  The patient  reports that she has never smoked. She has never used smokeless tobacco. She reports current alcohol use. She reports that she does not use drugs.   Family History:  The patient's family history includes Cancer in her brother and mother; Diabetes in her father;  Healthy in her son; Heart attack in her father; Heart disease in her father; Heart failure in her mother; Hypertension in her father and mother; Migraines in her daughter; Other in her mother.    ROS:  Please see the history of present illness.   Otherwise, review of systems are positive for none.   All other systems are reviewed and negative.    PHYSICAL EXAM: VS:  BP (!) 148/80   Pulse 64   Ht 5' 1.5" (1.562  m)   Wt 103 lb (46.7 kg)   SpO2 100%   BMI 19.15 kg/m  , BMI Body mass index is 19.15 kg/m. GEN: thin elderly female sitting in chair in no acute distress HEENT: sclera anicteric Neck: no JVD, carotid bruits, or masses Cardiac: RRR; no murmurs, rubs, or gallops, no edema  Respiratory:  clear to auscultation bilaterally, normal work of breathing GI: soft, nontender, nondistended, + BS MS: no deformity or atrophy Skin: warm and dry, no rash Neuro:  Strength and sensation are intact Psych: euthymic mood, full affect   EKG:  EKG is ordered today. The ekg ordered today demonstrates NSR, rate 60 bpm, no STE/D, no TWI, QTC 398.   Recent Labs: No results found for requested labs within last 8760 hours.    Lipid Panel    Component Value Date/Time   CHOL 174 05/21/2018 0412   TRIG 59 05/21/2018 0412   HDL 71 05/21/2018 0412   CHOLHDL 2.5 05/21/2018 0412   VLDL 12 05/21/2018 0412   LDLCALC 91 05/21/2018 0412      Wt Readings from Last 3 Encounters:  07/26/19 103 lb (46.7 kg)  07/14/19 102 lb (46.3 kg)  05/19/19 106 lb (48.1 kg)      Other studies Reviewed: Additional studies/ records that were reviewed today include:   Coronary CTA 05/2018: 1. Coronary artery calcium score 260 Agatston units. This places the patient in the 73rd percentile for age and gender, suggesting intermediate risk for future cardiac events.  2.  Nonobstructive CAD.  Echocardiogram 05/2018: - Left ventricle: The cavity size was normal. Wall thickness was   normal. Systolic function  was normal. The estimated ejection   fraction was in the range of 60% to 65%. Wall motion was normal;   there were no regional wall motion abnormalities. Features are   consistent with a pseudonormal left ventricular filling pattern,   with concomitant abnormal relaxation and increased filling   pressure (grade 2 diastolic dysfunction). - Mitral valve: There was mild regurgitation. - Pulmonary arteries: Systolic pressure was mildly increased. PA   peak pressure: 34 mm Hg (S).  Carotid duplex 07/14/2019: Right Carotid: Velocities in the right ICA are consistent with a 1-39% stenosis.  Left Carotid: Velocities in the left ICA are consistent with a 1-39% stenosis.  Vertebrals:  Bilateral vertebral arteries demonstrate antegrade flow. Subclavians: Normal flow hemodynamics were seen in bilateral subclavian              arteries.     ASSESSMENT AND PLAN:  1. Non-obstructive CAD: she reports an occasional ache in her chest which last seconds. Reassuring work up 05/2018 with mild plaque in RCA and LAD. Unlikely to have had progression of disease since that time and chest pain is atypical.  - Continue aspirin and statin - Continue aggressive risk factor modifications below  2. HTN: BP 148/80 today. Typically in the 140s/80s at home. She reports starting amlodipine 79m daily at her last PCP visit 06/2019.  - Will increase amlodipine to 10mg  daily - Continue bisoprolol and HCTZ  3. HLD: no recent lipids; LDL 71 04/2019; goal <70 - Continue atorvastatin and zetia  4. Carotid artery disease: followed by Dr. [name]. Carotid duplex 07/14/19 showed mild bilateral stenosis - Continue aspirin and statin - Continue routine monitoring per Vasc surgery  5. CVA/TIA: No recent neurologic issues - Continue aspirin and statin    Current medicines are reviewed at length with the patient today.  The patient does not have concerns  regarding medicines.  The following changes have been made:  As  above  Labs/ tests ordered today include:   Orders Placed This Encounter  Procedures  . EKG 12-Lead     Disposition:   FU with Dr. Martinique in 1 year  Signed, Abigail Butts, PA-C  07/26/2019 11:22 PM

## 2019-07-26 ENCOUNTER — Other Ambulatory Visit: Payer: Self-pay

## 2019-07-26 ENCOUNTER — Ambulatory Visit: Payer: Medicare Other | Admitting: Medical

## 2019-07-26 ENCOUNTER — Encounter: Payer: Self-pay | Admitting: Medical

## 2019-07-26 VITALS — BP 148/80 | HR 64 | Ht 61.5 in | Wt 103.0 lb

## 2019-07-26 DIAGNOSIS — I2510 Atherosclerotic heart disease of native coronary artery without angina pectoris: Secondary | ICD-10-CM | POA: Diagnosis not present

## 2019-07-26 DIAGNOSIS — I6523 Occlusion and stenosis of bilateral carotid arteries: Secondary | ICD-10-CM

## 2019-07-26 DIAGNOSIS — E785 Hyperlipidemia, unspecified: Secondary | ICD-10-CM

## 2019-07-26 DIAGNOSIS — I639 Cerebral infarction, unspecified: Secondary | ICD-10-CM

## 2019-07-26 DIAGNOSIS — I10 Essential (primary) hypertension: Secondary | ICD-10-CM

## 2019-07-26 MED ORDER — AMLODIPINE BESYLATE 10 MG PO TABS: 10.0000 mg | ORAL_TABLET | Freq: Every day | ORAL | 3 refills | Status: DC

## 2019-07-26 NOTE — Patient Instructions (Signed)
Medication Instructions:  Increase Amlodipine to 10mg  Daily.  If you need a refill on your cardiac medications before your next appointment, please call your pharmacy.   Lab work: NONE  Testing/Procedures: NONE  Follow-Up: At [facility], you and your health needs are our priority.  As part of our continuing mission to provide you with exceptional heart care, we have created designated Provider Care Teams.  These Care Teams include your primary Cardiologist (physician) and Advanced Practice Providers (APPs -  Physician Assistants and Nurse Practitioners) who all work together to provide you with the care you need, when you need it. You may see Peter Martinique, MD or one of the following Advanced Practice Providers on your designated Care Team:    Almyra Deforest, PA-C  Fabian Sharp, [place] or   Roby Lofts, [place]   Your physician wants you to follow-up in: 1 year with Dr. Martinique  Any Other Special Instructions Will Be Listed Below (If Applicable). Keep a Blood Pressure Log. If you do not maintain a goal of 130/80, please let us know.

## 2019-07-29 ENCOUNTER — Ambulatory Visit: Payer: Medicare Other | Attending: Internal Medicine

## 2019-07-29 ENCOUNTER — Other Ambulatory Visit: Payer: Self-pay

## 2019-07-29 DIAGNOSIS — Z23 Encounter for immunization: Secondary | ICD-10-CM | POA: Insufficient documentation

## 2019-07-29 NOTE — Progress Notes (Signed)
   Covid-19 Vaccination Clinic  Name:  Shannon Berger    MRN: [id number] DOB: 1940/07/10  07/29/2019  [name] was observed post Covid-19 immunization for 15 minutes without incidence. She was provided with Vaccine Information Sheet and instruction to access the V-Safe system.   [name] was instructed to call 911 with any severe reactions post vaccine: Marland Kitchen Difficulty breathing  . Swelling of your face and throat  . A fast heartbeat  . A bad rash all over your body  . Dizziness and weakness    Immunizations Administered    Name Date Dose VIS Date Route   Pfizer COVID-19 Vaccine 07/29/2019  5:55 PM 0.3 mL 06/18/2019 Intramuscular   Manufacturer: [place]   Lot: [id number]   [place]: [id number]

## 2019-08-05 ENCOUNTER — Ambulatory Visit: Payer: Medicare Other

## 2019-08-10 DIAGNOSIS — I951 Orthostatic hypotension: Secondary | ICD-10-CM | POA: Diagnosis not present

## 2019-08-10 DIAGNOSIS — I6522 Occlusion and stenosis of left carotid artery: Secondary | ICD-10-CM | POA: Diagnosis not present

## 2019-08-10 DIAGNOSIS — I129 Hypertensive chronic kidney disease with stage 1 through stage 4 chronic kidney disease, or unspecified chronic kidney disease: Secondary | ICD-10-CM | POA: Diagnosis not present

## 2019-08-10 DIAGNOSIS — R0600 Dyspnea, unspecified: Secondary | ICD-10-CM | POA: Diagnosis not present

## 2019-08-19 ENCOUNTER — Ambulatory Visit: Payer: Medicare Other | Attending: Internal Medicine

## 2019-08-19 DIAGNOSIS — Z23 Encounter for immunization: Secondary | ICD-10-CM | POA: Insufficient documentation

## 2019-08-19 NOTE — Progress Notes (Signed)
   Covid-19 Vaccination Clinic  Name:  Shannon Berger    MRN: [id number] DOB: 11/25/1940  08/19/2019  [name] was observed post Covid-19 immunization for 15 minutes without incidence. She was provided with Vaccine Information Sheet and instruction to access the V-Safe system.   [name] was instructed to call 911 with any severe reactions post vaccine: Marland Kitchen Difficulty breathing  . Swelling of your face and throat  . A fast heartbeat  . A bad rash all over your body  . Dizziness and weakness    Immunizations Administered    Name Date Dose VIS Date Route   Pfizer COVID-19 Vaccine 08/19/2019 12:29 PM 0.3 mL 06/18/2019 Intramuscular   Manufacturer: [place]   Lot: [id number]   [place]: [id number]

## 2019-08-23 DIAGNOSIS — F419 Anxiety disorder, unspecified: Secondary | ICD-10-CM | POA: Diagnosis not present

## 2019-08-23 DIAGNOSIS — F329 Major depressive disorder, single episode, unspecified: Secondary | ICD-10-CM | POA: Diagnosis not present

## 2019-08-26 ENCOUNTER — Ambulatory Visit: Payer: Medicare Other

## 2019-09-09 DIAGNOSIS — F411 Generalized anxiety disorder: Secondary | ICD-10-CM | POA: Diagnosis not present

## 2019-09-09 DIAGNOSIS — F331 Major depressive disorder, recurrent, moderate: Secondary | ICD-10-CM | POA: Diagnosis not present

## 2019-09-19 DIAGNOSIS — Z03818 Encounter for observation for suspected exposure to other biological agents ruled out: Secondary | ICD-10-CM | POA: Diagnosis not present

## 2019-09-19 DIAGNOSIS — Z20828 Contact with and (suspected) exposure to other viral communicable diseases: Secondary | ICD-10-CM | POA: Diagnosis not present

## 2019-09-19 DIAGNOSIS — U071 COVID-19: Secondary | ICD-10-CM | POA: Diagnosis not present

## 2019-09-22 ENCOUNTER — Other Ambulatory Visit: Payer: Self-pay | Admitting: Physician Assistant

## 2019-09-22 DIAGNOSIS — U071 COVID-19: Secondary | ICD-10-CM

## 2019-09-22 DIAGNOSIS — I10 Essential (primary) hypertension: Secondary | ICD-10-CM

## 2019-09-22 DIAGNOSIS — I2510 Atherosclerotic heart disease of native coronary artery without angina pectoris: Secondary | ICD-10-CM

## 2019-09-22 DIAGNOSIS — I6523 Occlusion and stenosis of bilateral carotid arteries: Secondary | ICD-10-CM

## 2019-09-22 DIAGNOSIS — I639 Cerebral infarction, unspecified: Secondary | ICD-10-CM

## 2019-09-22 MED ORDER — SODIUM CHLORIDE 0.9 % IV SOLN
700.0000 mg | Freq: Once | INTRAVENOUS | Status: AC
  Administered 2019-09-23: 700 mg via INTRAVENOUS
  Filled 2019-09-22: qty 700

## 2019-09-22 NOTE — Progress Notes (Signed)
  I connected by phone with Shannon Berger on 09/22/2019 at 3:01 PM to discuss the potential use of an new treatment for mild to moderate COVID-19 viral infection in non-hospitalized patients.  This patient is a 79 y.o. female that meets the FDA criteria for Emergency Use Authorization of bamlanivimab or casirivimab\imdevimab.  Has a (+) direct SARS-CoV-2 viral test result  Has mild or moderate COVID-19   Is ? 79 years of age and weighs ? 40 kg  Is NOT hospitalized due to COVID-19  Is NOT requiring oxygen therapy or requiring an increase in baseline oxygen flow rate due to COVID-19  Is within 10 days of symptom onset  Has at least one of the high risk factor(s) for progression to severe COVID-19 and/or hospitalization as defined in EUA.  Specific high risk criteria : >/= 79 yo   Hx of cardiovascular disease and hypertension    I have spoken and communicated the following to the patient or parent/caregiver:  1. FDA has authorized the emergency use of bamlanivimab and casirivimab\imdevimab for the treatment of mild to moderate COVID-19 in adults and pediatric patients with positive results of direct SARS-CoV-2 viral testing who are 62 years of age and older weighing at least 40 kg, and who are at high risk for progressing to severe COVID-19 and/or hospitalization.  2. The significant known and potential risks and benefits of bamlanivimab and casirivimab\imdevimab, and the extent to which such potential risks and benefits are unknown.  3. Information on available alternative treatments and the risks and benefits of those alternatives, including clinical trials.  4. Patients treated with bamlanivimab and casirivimab\imdevimab should continue to self-isolate and use infection control measures (e.g., wear mask, isolate, social distance, avoid sharing personal items, clean and disinfect "high touch" surfaces, and frequent handwashing) according to CDC guidelines.   5. The patient or  parent/caregiver has the option to accept or refuse bamlanivimab or casirivimab\imdevimab .  After reviewing this information with the patient, The patient agreed to proceed with receiving the bamlanimivab infusion and will be provided a copy of the Fact sheet prior to receiving the infusion.  Shannon Berger Shannon Berger 09/22/2019 3:01 PM

## 2019-09-23 ENCOUNTER — Ambulatory Visit (HOSPITAL_COMMUNITY)
Admission: RE | Admit: 2019-09-23 | Discharge: 2019-09-23 | Disposition: A | Discharge status: Home / Self Care | Payer: Medicare Other | Source: Ambulatory Visit | Attending: Pulmonary Disease | Admitting: Pulmonary Disease

## 2019-09-23 DIAGNOSIS — Z23 Encounter for immunization: Secondary | ICD-10-CM | POA: Insufficient documentation

## 2019-09-23 DIAGNOSIS — U071 COVID-19: Secondary | ICD-10-CM | POA: Diagnosis present

## 2019-09-23 MED ORDER — SODIUM CHLORIDE 0.9 % IV SOLN: INTRAVENOUS | Status: DC | PRN

## 2019-09-23 MED ORDER — FAMOTIDINE IN NACL 20-0.9 MG/50ML-% IV SOLN: 20.0000 mg | Freq: Once | INTRAVENOUS | Status: DC | PRN

## 2019-09-23 MED ORDER — DIPHENHYDRAMINE HCL 50 MG/ML IJ SOLN: 50.0000 mg | Freq: Once | INTRAMUSCULAR | Status: DC | PRN

## 2019-09-23 MED ORDER — ALBUTEROL SULFATE HFA 108 (90 BASE) MCG/ACT IN AERS: 2.0000 | INHALATION_SPRAY | Freq: Once | RESPIRATORY_TRACT | Status: DC | PRN

## 2019-09-23 MED ORDER — EPINEPHRINE 0.3 MG/0.3ML IJ SOAJ: 0.3000 mg | Freq: Once | INTRAMUSCULAR | Status: DC | PRN

## 2019-09-23 MED ORDER — METHYLPREDNISOLONE SODIUM SUCC 125 MG IJ SOLR: 125.0000 mg | Freq: Once | INTRAMUSCULAR | Status: DC | PRN

## 2019-09-23 NOTE — Progress Notes (Signed)
  Diagnosis: COVID-19  Physician: Dr. Joya Gaskins  Procedure: Covid Infusion Clinic Med: bamlanivimab infusion - Provided patient with bamlanimivab fact sheet for patients, parents and caregivers prior to infusion.  Complications: No immediate complications noted.  Discharge: Discharged home   Shannon Berger 09/23/2019

## 2019-09-23 NOTE — Discharge Instructions (Signed)

## 2019-10-01 DIAGNOSIS — M48062 Spinal stenosis, lumbar region with neurogenic claudication: Secondary | ICD-10-CM | POA: Diagnosis not present

## 2019-10-25 DIAGNOSIS — F329 Major depressive disorder, single episode, unspecified: Secondary | ICD-10-CM | POA: Diagnosis not present

## 2019-10-28 DIAGNOSIS — Z0000 Encounter for general adult medical examination without abnormal findings: Secondary | ICD-10-CM | POA: Diagnosis not present

## 2019-11-03 DIAGNOSIS — H04123 Dry eye syndrome of bilateral lacrimal glands: Secondary | ICD-10-CM | POA: Diagnosis not present

## 2019-11-03 DIAGNOSIS — H5712 Ocular pain, left eye: Secondary | ICD-10-CM | POA: Diagnosis not present

## 2019-11-07 DIAGNOSIS — H5712 Ocular pain, left eye: Secondary | ICD-10-CM | POA: Insufficient documentation

## 2019-11-07 DIAGNOSIS — H04123 Dry eye syndrome of bilateral lacrimal glands: Secondary | ICD-10-CM | POA: Insufficient documentation

## 2019-11-18 DIAGNOSIS — I8310 Varicose veins of unspecified lower extremity with inflammation: Secondary | ICD-10-CM | POA: Diagnosis not present

## 2019-11-18 DIAGNOSIS — F329 Major depressive disorder, single episode, unspecified: Secondary | ICD-10-CM | POA: Diagnosis not present

## 2019-11-18 DIAGNOSIS — N1831 Chronic kidney disease, stage 3a: Secondary | ICD-10-CM | POA: Diagnosis not present

## 2019-11-18 DIAGNOSIS — I129 Hypertensive chronic kidney disease with stage 1 through stage 4 chronic kidney disease, or unspecified chronic kidney disease: Secondary | ICD-10-CM | POA: Diagnosis not present

## 2019-12-08 ENCOUNTER — Telehealth: Payer: Self-pay | Admitting: Neurology

## 2019-12-08 DIAGNOSIS — I951 Orthostatic hypotension: Secondary | ICD-10-CM | POA: Diagnosis not present

## 2019-12-08 DIAGNOSIS — N1831 Chronic kidney disease, stage 3a: Secondary | ICD-10-CM | POA: Diagnosis not present

## 2019-12-08 DIAGNOSIS — Z1231 Encounter for screening mammogram for malignant neoplasm of breast: Secondary | ICD-10-CM | POA: Diagnosis not present

## 2019-12-08 DIAGNOSIS — R0600 Dyspnea, unspecified: Secondary | ICD-10-CM | POA: Diagnosis not present

## 2019-12-08 DIAGNOSIS — I129 Hypertensive chronic kidney disease with stage 1 through stage 4 chronic kidney disease, or unspecified chronic kidney disease: Secondary | ICD-10-CM | POA: Diagnosis not present

## 2019-12-08 NOTE — Telephone Encounter (Signed)
Spoke with patient and informed her that we can not make any medication changes without a an office visit. She voiced understanding and stated she is interested in an appt but was driving. Advised patient to contact the office once she is available; she voiced understanding.

## 2019-12-08 NOTE — Telephone Encounter (Signed)
Patient called to report her "tremors have gotten worse since seeing Dr. Carles Collet." She said she is ready to starting taking medication for the tremors now and hopes that Dr. Carles Collet will send in a prescription for her. Patient declined an appointment at this time stating, "I was just told to call if I wanted to start on something."  [facility] in [place]

## 2019-12-08 NOTE — Telephone Encounter (Signed)
Patient will need appointment.  She has not been seen in the clinic for almost a year (10 months)

## 2019-12-10 DIAGNOSIS — F3341 Major depressive disorder, recurrent, in partial remission: Secondary | ICD-10-CM | POA: Diagnosis not present

## 2019-12-10 DIAGNOSIS — F411 Generalized anxiety disorder: Secondary | ICD-10-CM | POA: Diagnosis not present

## 2019-12-13 ENCOUNTER — Encounter: Payer: Self-pay | Admitting: Anesthesiology

## 2019-12-14 ENCOUNTER — Telehealth: Payer: Self-pay | Admitting: *Deleted

## 2019-12-14 NOTE — Telephone Encounter (Signed)
Patient called concerned about her recent mammogram screening, received a call back from [facility] requesting additional imaging. I reviewed results with patient and she is scheduled on 12/27/19 for additional imaging. Explained can check with [facility] for sooner appointment daily. She also states she has a cyst on her buttocks that is causing discomfort, worried because she will leaving out town for 1 week for [employer]. I offered a visit to provider, transferred to appointments to schedule.

## 2019-12-15 ENCOUNTER — Ambulatory Visit: Payer: Medicare Other | Admitting: Obstetrics & Gynecology

## 2019-12-15 ENCOUNTER — Other Ambulatory Visit: Payer: Self-pay

## 2019-12-15 ENCOUNTER — Encounter: Payer: Self-pay | Admitting: Obstetrics & Gynecology

## 2019-12-15 VITALS — BP 140/80

## 2019-12-15 DIAGNOSIS — R928 Other abnormal and inconclusive findings on diagnostic imaging of breast: Secondary | ICD-10-CM

## 2019-12-15 DIAGNOSIS — K6289 Other specified diseases of anus and rectum: Secondary | ICD-10-CM | POA: Diagnosis not present

## 2019-12-15 NOTE — Progress Notes (Signed)
    Shannon Berger 1940/10/31 [id number]        79 y.o.  G2P2L2 Married  RP: Perianal cyst x 2 months  HPI: Left perianal cyst growing x 2 months and tender now.  No discharge. Urine/BMs normal.  No fever.  Recent mammogram December 13, 2019 with abnormal results.   OB History  Gravida Para Term Preterm AB Living  2 2       2   SAB TAB Ectopic Multiple Live Births               # Outcome Date GA Lbr Len/2nd Weight Sex Delivery Anes PTL Lv  2 Para           1 Para             Past medical history,surgical history, problem list, medications, allergies, family history and social history were all reviewed and documented in the EPIC chart.   Directed ROS with pertinent positives and negatives documented in the history of present illness/assessment and plan.  Exam:  Vitals:   12/15/19 1007  BP: 140/80   General appearance:  Normal  Gynecologic exam: Deferred  Perianal area:  Pilonidal cyst in the process of drainage.  Pressure around it which expelled a very thick whitish material.  No pus.  No erythema.   Mammo 12/13/2019:  Rt breast calcifications.  Lt breast 0.9 cm nodule.  Needs additional imaging with Rt Dx mammo and Lt Dx mammo/US.    Assessment/Plan:  79 y.o. G2P2   1. Cyst of perianal area Pilonidal cyst drained.  No sign of infection.  Patient may apply an antibiotic cream.  Recommend warm soaking daily.  Patient will call back for reassessment if signs of infection.  2. Abnormal mammogram of both breasts Abnormal mammogram December 13, 2019.  Results discussed with patient.  We will schedule a bilateral diagnostic mammogram with probable ultrasound.  Other orders - ARIPiprazole (ABILIFY) 2 MG tablet; Take 2 mg by mouth daily.  Princess Bruins MD, 10:58 AM 12/15/2019

## 2019-12-16 ENCOUNTER — Encounter: Payer: Self-pay | Admitting: Obstetrics & Gynecology

## 2019-12-16 DIAGNOSIS — N6489 Other specified disorders of breast: Secondary | ICD-10-CM | POA: Diagnosis not present

## 2019-12-16 DIAGNOSIS — R921 Mammographic calcification found on diagnostic imaging of breast: Secondary | ICD-10-CM | POA: Diagnosis not present

## 2019-12-16 NOTE — Patient Instructions (Signed)
1. Cyst of perianal area Pilonidal cyst drained.  No sign of infection.  Patient may apply an antibiotic cream.  Recommend warm soaking daily.  Patient will call back for reassessment if signs of infection.  2. Abnormal mammogram of both breasts Abnormal mammogram December 13, 2019.  Results discussed with patient.  We will schedule a bilateral diagnostic mammogram with probable ultrasound.  Other orders - ARIPiprazole (ABILIFY) 2 MG tablet; Take 2 mg by mouth daily.  Shannon Berger, it was a pleasure seeing you today!

## 2019-12-17 ENCOUNTER — Encounter: Payer: Self-pay | Admitting: Anesthesiology

## 2020-01-05 DIAGNOSIS — D692 Other nonthrombocytopenic purpura: Secondary | ICD-10-CM | POA: Diagnosis not present

## 2020-01-05 DIAGNOSIS — L814 Other melanin hyperpigmentation: Secondary | ICD-10-CM | POA: Diagnosis not present

## 2020-01-05 DIAGNOSIS — L84 Corns and callosities: Secondary | ICD-10-CM | POA: Diagnosis not present

## 2020-01-05 DIAGNOSIS — Z85828 Personal history of other malignant neoplasm of skin: Secondary | ICD-10-CM | POA: Diagnosis not present

## 2020-01-31 ENCOUNTER — Telehealth: Payer: Self-pay | Admitting: Neurology

## 2020-01-31 NOTE — Telephone Encounter (Signed)
Patient reports that her tremor has gotten worse. Her mouth and tongue are trembling constantly. She is see seeking advice. She is scheduled for the first available follow up and was put on the waitlist.

## 2020-01-31 NOTE — Telephone Encounter (Signed)
Pt c/o increasing tremor Current tremor meds AND times they are taken: Not on anything Pts next appointment: 04/19/2020  Spoke with pt who states her hand tremors began getting gradually worse since last visit but her tongue has been  "trembling" a lot starting ~1.5 weeks ago which has become unbearable for her. States she didn't want to start any medication before because of the potential for SEs with her other medications, per her PCP, but she cannot stand her tongue trembling and is ready for anything that might be recommended. Told her that Dr Tat is out of the office this week but will give update to covering provider, she verbalized understanding.

## 2020-01-31 NOTE — Telephone Encounter (Signed)
Per Dr. Doristine Devoid response on last call last month, she needs an appt, cannot make medication changes over the phone if she is not on any. Thanks

## 2020-02-01 DIAGNOSIS — K6389 Other specified diseases of intestine: Secondary | ICD-10-CM | POA: Diagnosis not present

## 2020-02-01 DIAGNOSIS — Z86010 Personal history of colonic polyps: Secondary | ICD-10-CM | POA: Diagnosis not present

## 2020-02-01 DIAGNOSIS — Z800 Family history of malignant neoplasm of digestive organs: Secondary | ICD-10-CM | POA: Diagnosis not present

## 2020-02-01 DIAGNOSIS — D122 Benign neoplasm of ascending colon: Secondary | ICD-10-CM | POA: Diagnosis not present

## 2020-02-01 NOTE — Telephone Encounter (Signed)
Spoke with pts husband, told him pt is on wait list and that she cannot be treated until she sees Dr Tat in the office, he verbalized understanding.

## 2020-02-04 DIAGNOSIS — D122 Benign neoplasm of ascending colon: Secondary | ICD-10-CM | POA: Diagnosis not present

## 2020-02-10 DIAGNOSIS — N1831 Chronic kidney disease, stage 3a: Secondary | ICD-10-CM | POA: Diagnosis not present

## 2020-02-10 DIAGNOSIS — R0683 Snoring: Secondary | ICD-10-CM | POA: Diagnosis not present

## 2020-02-10 DIAGNOSIS — I129 Hypertensive chronic kidney disease with stage 1 through stage 4 chronic kidney disease, or unspecified chronic kidney disease: Secondary | ICD-10-CM | POA: Diagnosis not present

## 2020-02-10 DIAGNOSIS — G250 Essential tremor: Secondary | ICD-10-CM | POA: Diagnosis not present

## 2020-02-14 NOTE — Progress Notes (Signed)
Assessment/Plan:    1.  Tremor  -jaw and tongue tremor likely due to abilify.  Pt to discuss with NP.  Discussed that it may take up to 6 months to resolve if able to get off of meds.    -has very very mild ET in hands.  Would not recommend meds for that  2.  Depression, anxiety and panic attacks  -now following with psychiatric NP  3.  HTN  -BP very high in the office today.  Needs to f/u with PCP and states that PCP is aware and "working on it."    Subjective:   Shannon Berger was seen today in follow up for essential tremor.  My previous records were reviewed prior to todays visit. Pt last seen 1 year ago.  Patient did not want any medications.  We did discuss that her depression, anxiety and panic attacks could certainly worsen the tremor.  She and I discussed that she needed to follow-up with psychiatry in that regard.  Patient did call in June stating that she would like some medication for her tremor.  She was told that she needed to follow-up appointment since it had been about a year since she had been seen.  She is following up today in that regard.  States today that hands are a bit worse but facial/tongue tremor is much worse and the reason for f/u.  States that her mind is just "on it" all the time.  Not interfering with ADL's.  Tongue doesn't move outside of the mouth.  Mood has been "bad" since this started.  On abilify x few months.   Mouth tremor started after this.     ALLERGIES:   Allergies  Allergen Reactions  . Oxycodone-Acetaminophen Anaphylaxis  . Versed [Midazolam] Other (See Comments)    "makes me hyper and have hallucinations"  . Hydrocodone   . Hydrocodone-Acetaminophen Nausea And Vomiting  . Oxycodone     CURRENT MEDICATIONS:  Outpatient Encounter Medications as of 02/17/2020  Medication Sig  . Abaloparatide (TYMLOS [place]) Inject 80 mcg into the skin daily.   Marland Kitchen ALPRAZolam (XANAX) 0.5 MG tablet Take 0.25 mg by mouth at bedtime as needed.   .  ARIPiprazole (ABILIFY) 2 MG tablet Take 2 mg by mouth daily.  . Ascorbic Acid (VITAMIN C PO) Take 1,000 mg by mouth 2 (two) times daily.   Marland Kitchen aspirin EC 325 MG EC tablet Take 1 tablet (325 mg total) by mouth daily.  Marland Kitchen atorvastatin (LIPITOR) 40 MG tablet Take 40 mg by mouth daily.  . bisoprolol (ZEBETA) 10 MG tablet Take 10 mg by mouth daily.  Marland Kitchen CALCIUM PO Take 1,200 mg by mouth daily.   . Cholecalciferol (VITAMIN D) 2000 units tablet Take 2,000 Units by mouth as directed.  . desvenlafaxine (PRISTIQ) 100 MG 24 hr tablet Take 100 mg by mouth daily.  Marland Kitchen ezetimibe (ZETIA) 10 MG tablet Take 10 mg by mouth daily.  . hydrochlorothiazide (HYDRODIURIL) 25 MG tablet Take 12.5 mg by mouth. Half tablet every other day  . meloxicam (MOBIC) 15 MG tablet Take 15 mg by mouth as needed for pain.  . Multiple Vitamin (MULTIVITAMIN) tablet Take 1 tablet by mouth daily.    . Omega-3 Fatty Acids (FISH OIL PO) Take 1,400 mg by mouth 2 (two) times daily.   . potassium chloride (KLOR-CON) 10 MEQ tablet Take 1 tablet by mouth. Every other day with hctz  . senna (SENOKOT) 8.6 MG TABS tablet Take 1 tablet by  mouth at bedtime as needed for mild constipation.  Marland Kitchen zinc gluconate 50 MG tablet Take 50 mg by mouth daily.   No facility-administered encounter medications on file as of 02/17/2020.     Objective:    PHYSICAL EXAMINATION:    VITALS:   Vitals:   02/17/20 0819  BP: (!) 195/74  Pulse: (!) 58  SpO2: 99%  Weight: 106 lb (48.1 kg)  Height: 5\' 1"  (1.549 m)    GEN:  The patient appears stated age and is in NAD. HEENT:  Normocephalic, atraumatic.  The mucous membranes are moist. The superficial temporal arteries are without ropiness or tenderness. CV:  Loletha Grayer.  Regular. Lungs:  CTAB Neck/HEME:  There are no carotid bruits bilaterally.  Neurological examination:  Orientation: The patient is alert and oriented x3. Cranial nerves: There is good facial symmetry. The speech is fluent and clear. Soft palate rises  symmetrically and there is no tongue deviation. Hearing is intact to conversational tone. Sensation: Sensation is intact to light touch throughout Motor: Strength is at least antigravity x4.  Movement examination: Tone: There is normal tone in the UE/LE Abnormal movements: no rest tremor.  No postural tremor.  Minimal intention tremor.  Significant jaw and tongue tremor.  Tongue stays within the mouth Coordination:  There is no decremation with RAM's, with any form of RAMS, including alternating supination and pronation of the forearm, hand opening and closing, finger taps, heel taps and toe taps. Gait and Station: The patient has no difficulty arising out of a deep-seated chair without the use of the hands. The patient's stride length is good with her cane  Total time spent on today's visit was 30 minutes, including both face-to-face time and nonface-to-face time.  Time included that spent on review of records (prior notes available to me/labs/imaging if pertinent), discussing treatment and goals, answering patient's questions and coordinating care.  Cc:  [name], MD

## 2020-02-17 ENCOUNTER — Other Ambulatory Visit: Payer: Self-pay

## 2020-02-17 ENCOUNTER — Encounter: Payer: Self-pay | Admitting: Neurology

## 2020-02-17 ENCOUNTER — Ambulatory Visit: Payer: Medicare Other | Admitting: Neurology

## 2020-02-17 VITALS — BP 195/74 | HR 58 | Ht 61.0 in | Wt 106.0 lb

## 2020-02-17 DIAGNOSIS — G251 Drug-induced tremor: Secondary | ICD-10-CM | POA: Diagnosis not present

## 2020-02-17 NOTE — Patient Instructions (Signed)
I think that you likely have tremor due to abilify.  I don't recommend that you go off of abilify but do recommend that you call your NP about the medication and discuss with her.  The physicians and staff at [facility] are committed to providing excellent care. You may receive a survey requesting feedback about your experience at our office. We strive to receive "very good" responses to the survey questions. If you feel that your experience would prevent you from giving the office a "very good " response, please contact our office to try to remedy the situation. We may be reached at [phone]. Thank you for taking the time out of your busy day to complete the survey.

## 2020-02-23 DIAGNOSIS — Z961 Presence of intraocular lens: Secondary | ICD-10-CM | POA: Diagnosis not present

## 2020-02-23 DIAGNOSIS — H43813 Vitreous degeneration, bilateral: Secondary | ICD-10-CM | POA: Diagnosis not present

## 2020-02-23 DIAGNOSIS — H524 Presbyopia: Secondary | ICD-10-CM | POA: Diagnosis not present

## 2020-02-23 DIAGNOSIS — H47211 Primary optic atrophy, right eye: Secondary | ICD-10-CM | POA: Diagnosis not present

## 2020-02-24 DIAGNOSIS — I10 Essential (primary) hypertension: Secondary | ICD-10-CM | POA: Diagnosis not present

## 2020-02-24 DIAGNOSIS — G250 Essential tremor: Secondary | ICD-10-CM | POA: Diagnosis not present

## 2020-02-24 DIAGNOSIS — I129 Hypertensive chronic kidney disease with stage 1 through stage 4 chronic kidney disease, or unspecified chronic kidney disease: Secondary | ICD-10-CM | POA: Diagnosis not present

## 2020-02-24 DIAGNOSIS — N1831 Chronic kidney disease, stage 3a: Secondary | ICD-10-CM | POA: Diagnosis not present

## 2020-02-24 DIAGNOSIS — R0683 Snoring: Secondary | ICD-10-CM | POA: Diagnosis not present

## 2020-03-10 DIAGNOSIS — F3341 Major depressive disorder, recurrent, in partial remission: Secondary | ICD-10-CM | POA: Diagnosis not present

## 2020-03-10 DIAGNOSIS — F411 Generalized anxiety disorder: Secondary | ICD-10-CM | POA: Diagnosis not present

## 2020-03-15 DIAGNOSIS — I2510 Atherosclerotic heart disease of native coronary artery without angina pectoris: Secondary | ICD-10-CM | POA: Diagnosis not present

## 2020-03-15 DIAGNOSIS — N1831 Chronic kidney disease, stage 3a: Secondary | ICD-10-CM | POA: Diagnosis not present

## 2020-03-15 DIAGNOSIS — I129 Hypertensive chronic kidney disease with stage 1 through stage 4 chronic kidney disease, or unspecified chronic kidney disease: Secondary | ICD-10-CM | POA: Diagnosis not present

## 2020-03-15 DIAGNOSIS — E785 Hyperlipidemia, unspecified: Secondary | ICD-10-CM | POA: Diagnosis not present

## 2020-03-22 ENCOUNTER — Telehealth: Payer: Self-pay | Admitting: Neurology

## 2020-03-22 ENCOUNTER — Institutional Professional Consult (permissible substitution): Payer: Medicare Other | Admitting: Neurology

## 2020-03-22 NOTE — Telephone Encounter (Signed)
Pt is no show for the sleep consult today with Dr Brett Fairy.

## 2020-03-28 DIAGNOSIS — R030 Elevated blood-pressure reading, without diagnosis of hypertension: Secondary | ICD-10-CM | POA: Diagnosis not present

## 2020-04-12 DIAGNOSIS — H524 Presbyopia: Secondary | ICD-10-CM | POA: Diagnosis not present

## 2020-04-17 DIAGNOSIS — M1811 Unilateral primary osteoarthritis of first carpometacarpal joint, right hand: Secondary | ICD-10-CM | POA: Diagnosis not present

## 2020-04-17 DIAGNOSIS — M189 Osteoarthritis of first carpometacarpal joint, unspecified: Secondary | ICD-10-CM | POA: Diagnosis not present

## 2020-04-17 DIAGNOSIS — M79641 Pain in right hand: Secondary | ICD-10-CM | POA: Diagnosis not present

## 2020-04-19 ENCOUNTER — Ambulatory Visit: Payer: Medicare Other | Admitting: Neurology

## 2020-04-19 DIAGNOSIS — Z23 Encounter for immunization: Secondary | ICD-10-CM | POA: Diagnosis not present

## 2020-04-24 ENCOUNTER — Encounter: Payer: Self-pay | Admitting: Neurology

## 2020-04-24 ENCOUNTER — Ambulatory Visit: Payer: Medicare Other | Admitting: Neurology

## 2020-04-24 VITALS — BP 157/72 | HR 57 | Ht 61.0 in | Wt 114.0 lb

## 2020-04-24 DIAGNOSIS — I6521 Occlusion and stenosis of right carotid artery: Secondary | ICD-10-CM | POA: Diagnosis not present

## 2020-04-24 DIAGNOSIS — I493 Ventricular premature depolarization: Secondary | ICD-10-CM

## 2020-04-24 DIAGNOSIS — F5104 Psychophysiologic insomnia: Secondary | ICD-10-CM | POA: Diagnosis not present

## 2020-04-24 NOTE — Patient Instructions (Signed)

## 2020-04-24 NOTE — Progress Notes (Addendum)
SLEEP MEDICINE CLINIC    Provider:  Larey Seat, MD  Primary Care Physician:  [name], MD [place] [address]     Referring Provider: [name], [place] [address],  [place] [address]          Chief Complaint according to patient   Patient presents with:    . New Patient (Initial Visit)           HISTORY OF PRESENT ILLNESS:  Shannon Berger is a 79  Year- old Caucasian female patient  here in a consultation visit on 04/24/2020 from Dr Joylene Draft , for a sleep evaluation. Patient is established with Dr. Carles Collet  Who follows her for essential tremor.  Chief concern according to patient : - I had a spell of not catching my breath- while she took a nap on the sofa. She recently had high blood pressures, and needs to screen for OSA. Also chronic insomnia.     I have the pleasure of seeing Shannon Berger today, a right-handed  Caucasian female with a possible sleep disorder.  She   has a past medical history of Anxiety, Arthritis, Carotid arterial disease ([place]), Complication of anesthesia, Constipation, Degeneration of lumbar intervertebral disc, Dislocation of hip joint prosthesis ([place]), Essential tremor, Fusion of spine of lumbar region, Heart murmur, Hyperlipidemia, Hypertension, Low back pain radiating to left lower extremity, Melanoma ([place]), Osteoarthritis, Osteopenia, Sleep difficulties, Spinal stenosis at L4-L5 level, Stroke ([place]) or  TIA (transient ischemic attack) (05/20/2018). She had a sleep study in hospital and couldn't sleep there.     Sleep relevant medical history: Nocturia 1-2 ,  Had a Tonsillectomy, cervical spine surgery and  Rhinitis, essential tremor, and snoring.she started using a multiprong cane after hardwear removal surgery DDD, lumbar fusion.      Family medical /sleep history: No other family member on CPAP with OSA,  Grandson with insomnia.    Social history:  Patient is retired from Print production planner at [employer] and lives in a household with spouse,  Family status is married , with 2 adult children, and  grandchildren.   A dog is present.Tobacco use- never .  ETOH use; glass of wine. Caffeine intake in form of Coffee( /) Soda( /) Tea ( in PM some times  ) or energy drinks. Regular exercise in form of [employer].       Sleep habits are as follows: The patient's dinner time is between 4 PM. The patient goes to bed at 12 PM or later- and takes a sleep aid , as she continues to sleep for 4 hours, wakes for  bathroom breaks.the bedroom is cool, quiet and dark.  The preferred sleep position is lateral, with the support of 1 pillow, on a flat bad.  Dreams are reportedly frequent/vivid and she rises at 9-11 AM is the usual rise time.  The patient wakes up spontaneously. She reports not feeling refreshed or restored in AM, with residual fatigue. Naps are taken infrequently, she doesn't plan them- her husband reports she dozes off on early evening lasting from 60-120 minutes.   Review of Systems: Out of a complete 14 system review, the patient complains of only the following symptoms, and all other reviewed systems are negative.:  Fatigue, sleepiness ,  snoring, fragmented sleep, Insomnia - chronic initiation insomnia.   "Catching my breath "   How likely are you to doze in the following situations: 0 = not likely, 1 = slight chance,  2 = moderate chance, 3 = high chance   Sitting and Reading? Watching Television? Sitting inactive in a public place (theater or meeting)? As a passenger in a car for an hour without a break? Lying down in the afternoon when circumstances permit? Sitting and talking to someone? Sitting quietly after lunch without alcohol? In a car, while stopped for a few minutes in traffic?   Total = 5/ 24 points   FSS endorsed at 23/ 63 points.   Social History   Socioeconomic History  . Marital status: Married    Spouse name: jerry  . Number of children: 2  . Years of  education: Not on file  . Highest education level: Bachelor's degree (e.g., BA, AB, BS)  Occupational History  . Occupation: Artist: RETIRED    Comment: retired  Tobacco Use  . Smoking status: Never Smoker  . Smokeless tobacco: Never Used  Vaping Use  . Vaping Use: Never used  Substance and Sexual Activity  . Alcohol use: Yes    Alcohol/week: 0.0 standard drinks    Comment: Occas.  . Drug use: Never  . Sexual activity: Not Currently    Partners: Male    Comment: 1st intercourse- 23, partners- 63, married- 39 yrs   Other Topics Concern  . Not on file  Social History Narrative   Pt lives at home with spouse jerry, 2 story home has 2 children   She is right handed, she drinks ginger ale and come tea, no coffee, or soda   Social Determinants of Health   Financial Resource Strain:   . Difficulty of Paying Living Expenses: Not on file  Food Insecurity:   . Worried About Charity fundraiser in the Last Year: Not on file  . Ran Out of Food in the Last Year: Not on file  Transportation Needs:   . Lack of Transportation (Medical): Not on file  . Lack of Transportation (Non-Medical): Not on file  Physical Activity:   . Days of Exercise per Week: Not on file  . Minutes of Exercise per Session: Not on file  Stress:   . Feeling of Stress : Not on file  Social Connections:   . Frequency of Communication with Friends and Family: Not on file  . Frequency of Social Gatherings with Friends and Family: Not on file  . Attends Religious Services: Not on file  . Active Member of Clubs or Organizations: Not on file  . Attends Archivist Meetings: Not on file  . Marital Status: Not on file    Family History  Problem Relation Age of Onset  . Heart failure Mother   . Cancer Mother        colon  . Hypertension Mother   . Other Mother        Varicose Veins  . Diabetes Father   . Heart disease Father   . Hypertension Father   . Heart attack Father   .  Cancer Brother        squamos cell - head, neck, ear  . Healthy Son   . Migraines Daughter     Past Medical History:  Diagnosis Date  . Anxiety   . Arthritis   . Carotid arterial disease ([place])   . Complication of anesthesia    "hallucinates for days; have a hard time waking up; almost couldn't get her awake last time; wake up wild" (05/26/2018)  . Constipation   . Degeneration of lumbar intervertebral  disc   . Dislocation of hip joint prosthesis ([place])   . Essential tremor   . Fusion of spine of lumbar region   . Heart murmur    "slight one" (05/26/2018)  . Hyperlipidemia   . Hypertension   . Low back pain radiating to left lower extremity   . Melanoma ([place])    "center of upper chest"  . Osteoarthritis   . Osteopenia   . Sleep difficulties   . Spinal stenosis at L4-L5 level   . Stroke ([place])    tia--last week  . TIA (transient ischemic attack) 05/20/2018    Past Surgical History:  Procedure Laterality Date  . ANTERIOR LUMBAR FUSION     added rods and screws;  Marland Kitchen BACK SURGERY    . BREAST BIOPSY Left    "not cancer"  . BUNIONECTOMY Left   . CAROTID ENDARTERECTOMY Left 05/26/2018  . CATARACT EXTRACTION W/ INTRAOCULAR LENS  IMPLANT, BILATERAL Bilateral   . ENDARTERECTOMY Left 05/26/2018   Procedure: ENDARTERECTOMY CAROTID LEFT;  Surgeon: Angelia Mould, MD;  Location: [facility];  Service: Vascular;  Laterality: Left;  . EYE SURGERY Right    "retina wrinkle OR; now can't see out of that eye" (05/26/2018)  . JOINT REPLACEMENT Left 2014   Hip  . LUMBAR SPINE HARDWARE REMOVAL     "went in thru the front"  . MELANOMA EXCISION     "center of upper chest"  . PATCH ANGIOPLASTY Left 05/26/2018   Procedure: PATCH ANGIOPLASTY LEFT CAROTID ARTERY;  Surgeon: Angelia Mould, MD;  Location: [facility];  Service: Vascular;  Laterality: Left;  . PILONIDAL CYST / SINUS EXCISION    . POSTERIOR LUMBAR FUSION  X 2  . TENDON REPAIR Left    Reattachment of tendon- Left hip  .  THUMB FUSION Right   . TONSILLECTOMY AND ADENOIDECTOMY    . TOTAL HIP ARTHROPLASTY Left 09/04/2012   Procedure: LEFT TOTAL HIP ARTHROPLASTY ANTERIOR APPROACH;  Surgeon: Mcarthur Rossetti, MD;  Location: [facility];  Service: Orthopedics;  Laterality: Left;  Left Total Hip Arthroplasty, Anterior Approach     Current Outpatient Medications on File Prior to Visit  Medication Sig Dispense Refill  . Abaloparatide (TYMLOS [place]) Inject 80 mcg into the skin daily.     Marland Kitchen ALPRAZolam (XANAX) 0.5 MG tablet Take 0.25 mg by mouth at bedtime as needed.     . ARIPiprazole (ABILIFY) 2 MG tablet Take 2 mg by mouth daily.    . Ascorbic Acid (VITAMIN C PO) Take 1,000 mg by mouth 2 (two) times daily.     Marland Kitchen aspirin EC 325 MG EC tablet Take 1 tablet (325 mg total) by mouth daily. 30 tablet 0  . atorvastatin (LIPITOR) 40 MG tablet Take 40 mg by mouth daily.  3  . bisoprolol (ZEBETA) 10 MG tablet Take 10 mg by mouth daily.    Marland Kitchen CALCIUM PO Take 1,200 mg by mouth daily.     . Cholecalciferol (VITAMIN D) 2000 units tablet Take 2,000 Units by mouth as directed.    . desvenlafaxine (PRISTIQ) 100 MG 24 hr tablet Take 100 mg by mouth daily.    Marland Kitchen ezetimibe (ZETIA) 10 MG tablet Take 10 mg by mouth daily.    . hydrochlorothiazide (HYDRODIURIL) 25 MG tablet Take 12.5 mg by mouth. Half tablet every other day    . meloxicam (MOBIC) 15 MG tablet Take 15 mg by mouth as needed for pain.    . Multiple Vitamin (MULTIVITAMIN) tablet Take  1 tablet by mouth daily.      Marland Kitchen olmesartan (BENICAR) 40 MG tablet Take 40 mg by mouth daily.    . Omega-3 Fatty Acids (FISH OIL PO) Take 1,400 mg by mouth 2 (two) times daily.     . potassium chloride (KLOR-CON) 10 MEQ tablet Take 1 tablet by mouth. Every other day with hctz    . senna (SENOKOT) 8.6 MG TABS tablet Take 1 tablet by mouth at bedtime as needed for mild constipation.    Marland Kitchen zinc gluconate 50 MG tablet Take 50 mg by mouth daily.     No current facility-administered medications on file prior  to visit.    Allergies  Allergen Reactions  . Oxycodone-Acetaminophen Anaphylaxis  . Versed [Midazolam] Other (See Comments)    "makes me hyper and have hallucinations"  . Hydrocodone   . Hydrocodone-Acetaminophen Nausea And Vomiting  . Oxycodone     Physical exam:  Today's Vitals   04/24/20 1122  BP: (!) 157/72  Pulse: (!) 57  Weight: 114 lb (51.7 kg)  Height: 5\' 1"  (1.549 m)   Body mass index is 21.54 kg/m.   Wt Readings from Last 3 Encounters:  04/24/20 114 lb (51.7 kg)  02/17/20 106 lb (48.1 kg)  07/26/19 103 lb (46.7 kg)     Ht Readings from Last 3 Encounters:  04/24/20 5\' 1"  (1.549 m)  02/17/20 5\' 1"  (1.549 m)  07/26/19 5' 1.5" (1.562 m)      General: The patient is awake, alert and appears not in acute distress. The patient is well groomed. Head: Normocephalic, atraumatic.  Neck is supple. Mallampati 1 ,  neck circumference:13 inches . Nasal airflow  patent.  Retrognathia is not seen. r titubation noted, hoarse  Low volume voice noted.  Dental status: intact  Cardiovascular:  Regular rate and cardiac rhythm by pulse,  without distended neck veins. Respiratory: Lungs are clear to auscultation.  Skin:  Without evidence of ankle edema, or rash. Trunk: The patient's posture is erect.   Neurologic exam : The patient is awake and alert, oriented to place and time.   Memory subjective described as intact.  Attention span & concentration ability appears normal.  Speech is fluent,  without  dysarthria, dysphonia or aphasia.  Mood and affect are appropriate.   Cranial nerves: no loss of smell or taste reported  Pupils are equal and briskly reactive to light. Funduscopic exam deferred. right eye lost vision, she has preserved peripheral vision, central scotoma.   - left eye uses a contact lens/  Extraocular movements in vertical and horizontal planes were intact and without nystagmus. No Diplopia. Hearing was impaired.   Facial sensation intact to fine touch.   Facial motor strength is symmetric and tongue and uvula move midline.  Neck ROM : rotation, tilt and flexion extension were normal for age and shoulder shrug was symmetrical.    Motor exam:  Symmetric bulk, tone and ROM.   Normal tone without cog wheeling, symmetric grip strength .   Sensory:  Fine touch, pinprick and vibration were tested  and  normal.  Proprioception tested in the upper extremities was normal.   Coordination: Rapid alternating movements in the fingers/hands were of normal speed.  The Finger-to-nose maneuver was intact without evidence of ataxia, dysmetria or tremor.   Gait and station: Patient could rise walk with her cane.  Toe and heel walk were deferred.  Deep tendon reflexes: in the  upper and lower extremities are symmetric, brisk and intact.  Babinski response was deferred .       After spending a total time of  45  minutes face to face and additional time for physical and neurologic examination, review of laboratory studies,  personal review of imaging studies, reports and results of other testing and review of referral information / records as far as provided in visit, I have established the following assessments:  1)  Patient with drug induced, essential tremor, presenting with longstanding Sleep initiation insomnia and recent single event of sleep choking.   No RLS.     My Plan is to proceed with:  1) HST to screen for sleep apnea,  2)  Sleep hygiene reviewed, no coffee or tea after 2-3 PM , hot shower before bedtime. Reading in a book not a screen or device.   I would like to thank [name], MD and [name], [place],  [place] [address] for allowing me to meet with and to take care of this pleasant patient.   In short, Shannon Berger is presenting with insomnia and a single choking event.   I plan to follow up either personally or through our NP within 2-3  month.   CC: I will share my notes with PCP.   Electronically  signed by: Larey Seat, MD 04/24/2020 11:40 AM  [facility] Board certified by The [employer] of Sleep Medicine and Diplomate of the [employer] of Sleep Medicine. Board certified In Neurology through the [place], Fellow of the [employer] of Neurology. Medical Director of [facility].

## 2020-05-02 ENCOUNTER — Encounter: Payer: Self-pay | Admitting: Obstetrics & Gynecology

## 2020-05-02 ENCOUNTER — Other Ambulatory Visit: Payer: Self-pay

## 2020-05-02 ENCOUNTER — Ambulatory Visit: Payer: Medicare Other | Admitting: Obstetrics & Gynecology

## 2020-05-02 DIAGNOSIS — R300 Dysuria: Secondary | ICD-10-CM | POA: Diagnosis not present

## 2020-05-02 MED ORDER — SULFAMETHOXAZOLE-TRIMETHOPRIM 800-160 MG PO TABS: 1.0000 | ORAL_TABLET | Freq: Two times a day (BID) | ORAL | 0 refills | Status: AC

## 2020-05-02 NOTE — Progress Notes (Signed)
    Shannon Berger July 02, 1941 [id number]        79 y.o.  G2P2L2  RP: Dysuria and urinary frequency x 2 days  HPI: Burning with urination and frequency for 2 days.  No blood in urine.  No fever.  Symptoms slightly better with Azo.  No vaginal discharge or bleeding.  No pelvic pain.  Bowel movements normal.   OB History  Gravida Para Term Preterm AB Living  2 2       2   SAB TAB Ectopic Multiple Live Births               # Outcome Date GA Lbr Len/2nd Weight Sex Delivery Anes PTL Lv  2 Para           1 Para             Past medical history,surgical history, problem list, medications, allergies, family history and social history were all reviewed and documented in the EPIC chart.   Directed ROS with pertinent positives and negatives documented in the history of present illness/assessment and plan.  Exam:  There were no vitals filed for this visit. General appearance:  Normal  CVAT Negative bilaterally  Gynecologic exam: Deferred  U/A: (Post Aso) Marjory Lies should clear, protein 1+, nitrite positive, white blood cells 0-5, red blood cells negative, bacteria few.  Urine culture pending.   Assessment/Plan:  79 y.o. G2P2   1. Dysuria Probable early acute cystitis.  Will treat with Bactrim DS 1 tablet twice a day for 3 days.  Usage reviewed and prescription sent to pharmacy.  Recommend good hydration with water.  We will continue Azo for the next 24 hours.  Pending urine culture. - Urinalysis,Complete w/RFL Culture  Other orders - sulfamethoxazole-trimethoprim (BACTRIM DS) 800-160 MG tablet; Take 1 tablet by mouth 2 (two) times daily for 3 days.  Princess Bruins MD, 12:26 PM 05/02/2020

## 2020-05-03 DIAGNOSIS — M5416 Radiculopathy, lumbar region: Secondary | ICD-10-CM | POA: Diagnosis not present

## 2020-05-04 LAB — URINALYSIS, COMPLETE W/RFL CULTURE
Bilirubin Urine: NEGATIVE
Hgb urine dipstick: NEGATIVE
Hyaline Cast: NONE SEEN /LPF
Ketones, ur: NEGATIVE
Leukocyte Esterase: NEGATIVE
Nitrites, Initial: POSITIVE — AB
RBC / HPF: NONE SEEN /HPF (ref 0–2)
Specific Gravity, Urine: 1.017 (ref 1.001–1.03)
pH: 5 (ref 5.0–8.0)

## 2020-05-04 LAB — URINE CULTURE
MICRO NUMBER:: 11119604
Result:: NO GROWTH
SPECIMEN QUALITY:: ADEQUATE

## 2020-05-04 LAB — CULTURE INDICATED

## 2020-05-08 ENCOUNTER — Other Ambulatory Visit (HOSPITAL_COMMUNITY): Payer: Self-pay | Admitting: Internal Medicine

## 2020-05-08 DIAGNOSIS — I129 Hypertensive chronic kidney disease with stage 1 through stage 4 chronic kidney disease, or unspecified chronic kidney disease: Secondary | ICD-10-CM | POA: Diagnosis not present

## 2020-05-08 DIAGNOSIS — N1831 Chronic kidney disease, stage 3a: Secondary | ICD-10-CM | POA: Diagnosis not present

## 2020-05-08 DIAGNOSIS — I2510 Atherosclerotic heart disease of native coronary artery without angina pectoris: Secondary | ICD-10-CM | POA: Diagnosis not present

## 2020-05-12 ENCOUNTER — Encounter (HOSPITAL_BASED_OUTPATIENT_CLINIC_OR_DEPARTMENT_OTHER): Payer: Self-pay

## 2020-05-12 ENCOUNTER — Other Ambulatory Visit: Payer: Self-pay

## 2020-05-12 ENCOUNTER — Ambulatory Visit (HOSPITAL_COMMUNITY)
Admission: RE | Admit: 2020-05-12 | Discharge: 2020-05-12 | Disposition: A | Discharge status: Home / Self Care | Payer: Medicare Other | Source: Ambulatory Visit | Attending: Internal Medicine | Admitting: Internal Medicine

## 2020-05-12 ENCOUNTER — Ambulatory Visit (HOSPITAL_BASED_OUTPATIENT_CLINIC_OR_DEPARTMENT_OTHER): Admission: RE | Admit: 2020-05-12 | Payer: Medicare Other | Source: Ambulatory Visit

## 2020-05-12 DIAGNOSIS — N179 Acute kidney failure, unspecified: Secondary | ICD-10-CM | POA: Diagnosis not present

## 2020-05-12 DIAGNOSIS — I129 Hypertensive chronic kidney disease with stage 1 through stage 4 chronic kidney disease, or unspecified chronic kidney disease: Secondary | ICD-10-CM | POA: Diagnosis not present

## 2020-05-12 DIAGNOSIS — N1831 Chronic kidney disease, stage 3a: Secondary | ICD-10-CM | POA: Insufficient documentation

## 2020-05-12 DIAGNOSIS — N189 Chronic kidney disease, unspecified: Secondary | ICD-10-CM | POA: Diagnosis not present

## 2020-05-15 ENCOUNTER — Ambulatory Visit (INDEPENDENT_AMBULATORY_CARE_PROVIDER_SITE_OTHER): Payer: Medicare Other | Admitting: Neurology

## 2020-05-15 DIAGNOSIS — E785 Hyperlipidemia, unspecified: Secondary | ICD-10-CM | POA: Diagnosis not present

## 2020-05-15 DIAGNOSIS — I493 Ventricular premature depolarization: Secondary | ICD-10-CM

## 2020-05-15 DIAGNOSIS — I6521 Occlusion and stenosis of right carotid artery: Secondary | ICD-10-CM

## 2020-05-15 DIAGNOSIS — F5104 Psychophysiologic insomnia: Secondary | ICD-10-CM

## 2020-05-15 DIAGNOSIS — G4733 Obstructive sleep apnea (adult) (pediatric): Secondary | ICD-10-CM

## 2020-05-15 DIAGNOSIS — M810 Age-related osteoporosis without current pathological fracture: Secondary | ICD-10-CM | POA: Diagnosis not present

## 2020-05-18 DIAGNOSIS — N390 Urinary tract infection, site not specified: Secondary | ICD-10-CM | POA: Diagnosis not present

## 2020-05-18 DIAGNOSIS — D414 Neoplasm of uncertain behavior of bladder: Secondary | ICD-10-CM | POA: Diagnosis not present

## 2020-05-19 ENCOUNTER — Other Ambulatory Visit (HOSPITAL_COMMUNITY): Payer: Self-pay | Admitting: Internal Medicine

## 2020-05-19 ENCOUNTER — Other Ambulatory Visit: Payer: Self-pay

## 2020-05-19 ENCOUNTER — Ambulatory Visit (HOSPITAL_COMMUNITY)
Admission: RE | Admit: 2020-05-19 | Discharge: 2020-05-19 | Disposition: A | Discharge status: Home / Self Care | Payer: Medicare Other | Source: Ambulatory Visit | Attending: Internal Medicine | Admitting: Internal Medicine

## 2020-05-19 DIAGNOSIS — I151 Hypertension secondary to other renal disorders: Secondary | ICD-10-CM | POA: Insufficient documentation

## 2020-05-19 DIAGNOSIS — N2889 Other specified disorders of kidney and ureter: Secondary | ICD-10-CM

## 2020-05-19 NOTE — Telephone Encounter (Signed)
Encounter opened in error

## 2020-05-22 ENCOUNTER — Other Ambulatory Visit: Payer: Self-pay | Admitting: Urology

## 2020-05-22 DIAGNOSIS — I10 Essential (primary) hypertension: Secondary | ICD-10-CM | POA: Diagnosis not present

## 2020-05-22 DIAGNOSIS — Z0000 Encounter for general adult medical examination without abnormal findings: Secondary | ICD-10-CM | POA: Diagnosis not present

## 2020-05-22 DIAGNOSIS — R82998 Other abnormal findings in urine: Secondary | ICD-10-CM | POA: Diagnosis not present

## 2020-05-22 DIAGNOSIS — N1831 Chronic kidney disease, stage 3a: Secondary | ICD-10-CM | POA: Diagnosis not present

## 2020-05-22 DIAGNOSIS — E785 Hyperlipidemia, unspecified: Secondary | ICD-10-CM | POA: Diagnosis not present

## 2020-05-22 DIAGNOSIS — I129 Hypertensive chronic kidney disease with stage 1 through stage 4 chronic kidney disease, or unspecified chronic kidney disease: Secondary | ICD-10-CM | POA: Diagnosis not present

## 2020-05-23 ENCOUNTER — Other Ambulatory Visit: Payer: Self-pay | Admitting: Urology

## 2020-05-23 MED ORDER — GEMCITABINE CHEMO FOR BLADDER INSTILLATION 2000 MG: 2000.0000 mg | Freq: Once | INTRAVENOUS | Status: AC

## 2020-05-23 NOTE — Patient Instructions (Addendum)
DUE TO COVID-19 ONLY ONE VISITOR IS ALLOWED TO COME WITH YOU AND STAY IN THE WAITING ROOM ONLY DURING PRE OP AND PROCEDURE DAY OF SURGERY. THE 1 VISITOR  MAY VISIT WITH YOU AFTER SURGERY IN YOUR PRIVATE ROOM DURING VISITING HOURS ONLY!  YOU NEED TO HAVE A COVID 19 TEST ON: 06/03/20 @ 11:30 AM, THIS TEST MUST BE DONE BEFORE SURGERY,  COVID TESTING SITE [place]  [id number], IT IS ON THE RIGHT GOING OUT [address] APPROXIMATELY  2 MINUTES PAST [employer] ON THE RIGHT. ONCE YOUR COVID TEST IS COMPLETED,  PLEASE BEGIN THE QUARANTINE INSTRUCTIONS AS OUTLINED IN YOUR HANDOUT.                Shannon Berger   Your procedure is scheduled on: 06/06/20   Report to [facility] Main  Entrance   Report to admitting at: 11:15 AM     Call this number if you have problems the morning of surgery [phone]    Remember: Do not eat solid food :After Midnight. Clear liquids until: 10:15 am.  CLEAR LIQUID DIET   Foods Allowed                                                                     Foods Excluded  Coffee and tea, regular and decaf                             liquids that you cannot  Plain Jell-O any favor except red or purple                                           see through such as: Fruit ices (not with fruit pulp)                                     milk, soups, orange juice  Iced Popsicles                                    All solid food Carbonated beverages, regular and diet                                    Cranberry, grape and apple juices Sports drinks like Gatorade Lightly seasoned clear broth or consume(fat free) Sugar, honey syrup  Sample Menu Breakfast                                Lunch                                     Supper Cranberry juice  Beef broth                            Chicken broth Jell-O                                     Grape juice                           Apple juice Coffee or  tea                        Jell-O                                      Popsicle                                                Coffee or tea                        Coffee or tea  _____________________________________________________________________  BRUSH YOUR TEETH MORNING OF SURGERY AND RINSE YOUR MOUTH OUT, NO CHEWING GUM CANDY OR MINTS.     Take these medicines the morning of surgery with A SIP OF WATER: apresoline,bisoprolol,pristiq.                               You may not have any metal on your body including hair pins and              piercings  Do not wear jewelry, make-up, lotions, powders or perfumes, deodorant             Do not wear nail polish on your fingernails.  Do not shave  48 hours prior to surgery.        Do not bring valuables to the hospital. [place].  Contacts, dentures or bridgework may not be worn into surgery.  Leave suitcase in the car. After surgery it may be brought to your room.     Patients discharged the day of surgery will not be allowed to drive home. IF YOU ARE HAVING SURGERY AND GOING HOME THE SAME DAY, YOU MUST HAVE AN ADULT TO DRIVE YOU HOME AND BE WITH YOU FOR 24 HOURS. YOU MAY GO HOME BY TAXI OR UBER OR ORTHERWISE, BUT AN ADULT MUST ACCOMPANY YOU HOME AND STAY WITH YOU FOR 24 HOURS.  Name and phone number of your driver:  Special Instructions: N/A              Please read over the following fact sheets you were given: _____________________________________________________________________          [facility] - Preparing for Surgery Before surgery, you can play an important role.  Because skin is not sterile, your skin needs to be as free of germs as possible.  You can reduce the number of germs on your skin by washing with CHG (chlorahexidine gluconate) soap before surgery.  CHG is an antiseptic cleaner which kills germs and bonds with the skin to continue killing germs even after washing. Please  DO NOT use if you have an allergy to CHG or antibacterial soaps.  If your skin becomes reddened/irritated stop using the CHG and inform your nurse when you arrive at Short Stay. Do not shave (including legs and underarms) for at least 48 hours prior to the first CHG shower.  You may shave your face/neck. Please follow these instructions carefully:  1.  Shower with CHG Soap the night before surgery and the  morning of Surgery.  2.  If you choose to wash your hair, wash your hair first as usual with your  normal  shampoo.  3.  After you shampoo, rinse your hair and body thoroughly to remove the  shampoo.                           4.  Use CHG as you would any other liquid soap.  You can apply chg directly  to the skin and wash                       Gently with a scrungie or clean washcloth.  5.  Apply the CHG Soap to your body ONLY FROM THE NECK DOWN.   Do not use on face/ open                           Wound or open sores. Avoid contact with eyes, ears mouth and genitals (private parts).                       Wash face,  Genitals (private parts) with your normal soap.             6.  Wash thoroughly, paying special attention to the area where your surgery  will be performed.  7.  Thoroughly rinse your body with warm water from the neck down.  8.  DO NOT shower/wash with your normal soap after using and rinsing off  the CHG Soap.                9.  Pat yourself dry with a clean towel.            10.  Wear clean pajamas.            11.  Place clean sheets on your bed the night of your first shower and do not  sleep with pets. Day of Surgery : Do not apply any lotions/deodorants the morning of surgery.  Please wear clean clothes to the hospital/surgery center.  FAILURE TO FOLLOW THESE INSTRUCTIONS MAY RESULT IN THE CANCELLATION OF YOUR SURGERY PATIENT SIGNATURE_________________________________  NURSE  SIGNATURE__________________________________  ________________________________________________________________________

## 2020-05-24 ENCOUNTER — Encounter (HOSPITAL_COMMUNITY)
Admission: RE | Admit: 2020-05-24 | Discharge: 2020-05-24 | Disposition: A | Discharge status: Home / Self Care | Payer: Medicare Other | Source: Ambulatory Visit | Attending: Urology | Admitting: Urology

## 2020-05-24 ENCOUNTER — Encounter (HOSPITAL_COMMUNITY): Payer: Self-pay

## 2020-05-24 ENCOUNTER — Other Ambulatory Visit: Payer: Self-pay

## 2020-05-24 DIAGNOSIS — Z01812 Encounter for preprocedural laboratory examination: Secondary | ICD-10-CM | POA: Diagnosis not present

## 2020-05-24 HISTORY — DX: Chronic kidney disease, unspecified: N18.9

## 2020-05-24 HISTORY — DX: Angina pectoris, unspecified: I20.9

## 2020-05-24 LAB — BASIC METABOLIC PANEL
Anion gap: 9 (ref 5–15)
BUN: 54 mg/dL — ABNORMAL HIGH (ref 8–23)
CO2: 28 mmol/L (ref 22–32)
Calcium: 10.4 mg/dL — ABNORMAL HIGH (ref 8.9–10.3)
Chloride: 104 mmol/L (ref 98–111)
Creatinine, Ser: 1.53 mg/dL — ABNORMAL HIGH (ref 0.44–1.00)
GFR, Estimated: 34 mL/min — ABNORMAL LOW (ref >60)
Glucose, Bld: 94 mg/dL (ref 70–99)
Potassium: 5.9 mmol/L — ABNORMAL HIGH (ref 3.5–5.1)
Sodium: 141 mmol/L (ref 135–145)

## 2020-05-24 LAB — CBC
HCT: 38 % (ref 36.0–46.0)
Hemoglobin: 12.5 g/dL (ref 12.0–15.0)
MCH: 33.7 pg (ref 26.0–34.0)
MCHC: 32.9 g/dL (ref 30.0–36.0)
MCV: 102.4 fL — ABNORMAL HIGH (ref 80.0–100.0)
Platelets: 287 10*3/uL (ref 150–400)
RBC: 3.71 MIL/uL — ABNORMAL LOW (ref 3.87–5.11)
RDW: 11.9 % (ref 11.5–15.5)
WBC: 5.3 10*3/uL (ref 4.0–10.5)
nRBC: 0 % (ref 0.0–0.2)

## 2020-05-24 NOTE — Progress Notes (Addendum)
COVID Vaccine Completed: Yes Date COVID Vaccine completed: 08/19/19 COVID vaccine manufacturer: Pfizer     PCP - Dr. [name]. LOV: 05/23/20 Cardiologist - Dr. Peter Martinique. LOV: 07/26/19  Chest x-ray -  EKG - 07/26/19 EPIC Stress Test -  ECHO - 05/21/18 Cardiac Cath -  Pacemaker/ICD device last checked:  Sleep Study -  CPAP -   Fasting Blood Sugar -  Checks Blood Sugar _____ times a day  Blood Thinner Instructions: Aspirin Instructions: Aspirin 325 can be hold five days before surgery. Last Dose:  Anesthesia review: Hx: TIA,CAD,HTN,Heart murmur,mini stroke.Pt is able to climb a flight of stairs without getting SOB.  Patient denies shortness of breath, fever, cough and chest pain at PAT appointment   Patient verbalized understanding of instructions that were given to them at the PAT appointment. Patient was also instructed that they will need to review over the PAT instructions again at home before surgery.

## 2020-05-24 NOTE — Progress Notes (Signed)
Lab. Results: Potassium: 5.9.    Cr: 1.53.    BUN: 54

## 2020-05-25 ENCOUNTER — Encounter: Payer: Self-pay | Admitting: Neurology

## 2020-05-25 DIAGNOSIS — F5104 Psychophysiologic insomnia: Secondary | ICD-10-CM | POA: Insufficient documentation

## 2020-05-25 DIAGNOSIS — G4733 Obstructive sleep apnea (adult) (pediatric): Secondary | ICD-10-CM | POA: Insufficient documentation

## 2020-05-25 NOTE — Procedures (Signed)
Sleep Study Report   Patient Information     First Name: Shannon Berger Last Name: Lie ID: [id number]  Birth Date: Feb 07, 2041 Age: 79 Gender: Female  Referring Provider: [name], MD BMI: 21.6 (W=115 lb, H=5' 1'')  Neck Circ.:  13 '' Epworth:  5/24   Sleep Study Information    Study Date: 05/15/20 S/H/A Version: [id number] / 4.2.1023 / 79  History:    Shannon Berger was seen on 04-24-2020, she is a right-handed Caucasian female with a possible sleep disorder.  She  has a past medical history of Anxiety, Arthritis, Carotid arterial disease ([place]), Complication of anesthesia, Constipation, Degeneration of lumbar intervertebral disc, Dislocation of hip joint prosthesis ([place]), Essential tremor, Fusion of spine of lumbar region, Heart murmur, Hyperlipidemia, Hypertension, Low back pain radiating to left lower extremity, Melanoma ([place]), Osteoarthritis, Osteopenia, Sleep difficulties, Spinal stenosis at L4-L5 level, Stroke ([place]) or  TIA (transient ischemic attack) (05/20/2018). She had a sleep study in hospital and couldn't sleep there, so validity of data was in question. Here to check for OSA.     Summary & Diagnosis:      This HST indicated an moderate- severe sleep apnea at AHI of 25/h with REM sleep AHI of 36.2/h. apnea was almost equal obstructive and central , complex form. There was no prolonged hypoxia of sleep. Strongly supine dependent apnea.  Recommendations:      This patient may fare best with an attended CPAP titration, given the central AHI of 13/h, almost 50% of overall apneas and hypopneas.  PS: If this is due to time restraints not possible, will attempt using auto titration capable CPASP device from 5-1 8 cm water, 3 cm EPR, mask of patient's choice.   Interpreting Physician: Larey Seat, MD           Sleep Summary  Oxygen Saturation Statistics   Start Study Time: End Study Time: Total Recording Time:          11:33:10 PM 8:54:41 AM   9 h, 21 min    Total Sleep Time % REM of Sleep Time:  8 h, 39 min  17.1    Mean: 94 Minimum: 89 Maximum: 98  Mean of Desaturations Nadirs (%):   91  Oxygen Desaturation. %:  4-9 10-20 >20 Total  Events Number Total   104  1 99.0 1.0  0 0.0  105 100.0  Oxygen Saturation: <90 <=88 <85 <80 <70  Duration (minutes): Sleep % 0.1 0.0 0.0 0.0 0.0 0.0 0.0 0.0 0.0 0.0     Respiratory Indices      Total Events REM NREM All Night  pRDI: pAHI 3%: ODI 4%: pAHIc 3%: % CSR:  206  202  105  77  21.7 107 36.2 36.2 24.6 18.8 23.8 23.2 10.8 8.2 26.0 25.5 13.2 10.1 13.5       Pulse Rate Statistics during Sleep (BPM)      Mean: 55 Minimum: 42 Maximum: 97    Indices are calculated using technically valid sleep time of 7 h, 55 min.                                           pAHI=25.5  Mild              Moderate                    Severe       5              15                    30   Body Position Statistics  Position Supine Prone Right Left Non-Supine  Sleep (min) 341.5 1.0 0.0 177.0 178.0  Sleep % 65.7 0.2 0.0 34.1 34.3  pRDI 36.3 N/A N/A 7.1 7.1  pAHI 3% 35.8 N/A N/A 6.8 6.8  ODI 4% 20.1 N/A N/A 0.7 0.7           Left   Prone  Supine    Snoring Statistics Snoring Level (dB) >40 >50 >60 >70 >80 >Threshold (45)  Sleep (min) 215.0 7.9 2.3 0.0 0.0 16.3  Sleep % 41.4 1.5 0.4 0.0 0.0 3.1    Mean: 4

## 2020-05-25 NOTE — Addendum Note (Signed)
Addended by: Larey Seat on: 05/25/2020 04:54 PM   Modules accepted: Orders

## 2020-05-25 NOTE — Progress Notes (Signed)
Summary & Diagnosis:     This HST indicated an moderate- severe sleep apnea at AHI of 25/h  with REM sleep AHI of 36.2/h. apnea was almost equal obstructive  and central , complex form. There was no prolonged hypoxia of  sleep.  Strongly supine dependent apnea.  Recommendations:     This patient may fare best with an attended CPAP titration, given  the central AHI of 13/h, almost 50% of overall apneas and  hypopneas.  PS: If this is due to time restraints not possible, will attempt  using auto titration capable CPASP device from 5-1 8 cm water, 3  cm EPR, mask of patient's choice.   Interpreting Physician: Larey Seat, MD

## 2020-05-31 ENCOUNTER — Encounter: Payer: Medicare Other | Admitting: Obstetrics & Gynecology

## 2020-06-03 ENCOUNTER — Other Ambulatory Visit (HOSPITAL_COMMUNITY)
Admission: RE | Admit: 2020-06-03 | Discharge: 2020-06-03 | Disposition: A | Discharge status: Home / Self Care | Payer: Medicare Other | Source: Ambulatory Visit | Attending: Urology | Admitting: Urology

## 2020-06-03 DIAGNOSIS — Z20822 Contact with and (suspected) exposure to covid-19: Secondary | ICD-10-CM | POA: Insufficient documentation

## 2020-06-03 DIAGNOSIS — Z01812 Encounter for preprocedural laboratory examination: Secondary | ICD-10-CM | POA: Diagnosis not present

## 2020-06-04 LAB — SARS CORONAVIRUS 2 (TAT 6-24 HRS): SARS Coronavirus 2: NEGATIVE

## 2020-06-05 DIAGNOSIS — I10 Essential (primary) hypertension: Secondary | ICD-10-CM | POA: Diagnosis not present

## 2020-06-06 ENCOUNTER — Ambulatory Visit (HOSPITAL_COMMUNITY)
Admission: RE | Admit: 2020-06-06 | Discharge: 2020-06-06 | Disposition: A | Discharge status: Home / Self Care | Payer: Medicare Other | Attending: Urology | Admitting: Urology

## 2020-06-06 ENCOUNTER — Encounter (HOSPITAL_COMMUNITY)
Admission: RE | Disposition: A | Discharge status: Home / Self Care | Payer: Self-pay | Source: Home / Self Care | Attending: Urology

## 2020-06-06 ENCOUNTER — Ambulatory Visit (HOSPITAL_COMMUNITY): Payer: Medicare Other

## 2020-06-06 ENCOUNTER — Encounter (HOSPITAL_COMMUNITY): Payer: Self-pay | Admitting: Urology

## 2020-06-06 ENCOUNTER — Ambulatory Visit (HOSPITAL_COMMUNITY): Payer: Medicare Other | Admitting: Anesthesiology

## 2020-06-06 ENCOUNTER — Ambulatory Visit (HOSPITAL_COMMUNITY): Payer: Medicare Other | Admitting: Physician Assistant

## 2020-06-06 DIAGNOSIS — N189 Chronic kidney disease, unspecified: Secondary | ICD-10-CM | POA: Insufficient documentation

## 2020-06-06 DIAGNOSIS — Z79899 Other long term (current) drug therapy: Secondary | ICD-10-CM | POA: Diagnosis not present

## 2020-06-06 DIAGNOSIS — G4730 Sleep apnea, unspecified: Secondary | ICD-10-CM | POA: Insufficient documentation

## 2020-06-06 DIAGNOSIS — I129 Hypertensive chronic kidney disease with stage 1 through stage 4 chronic kidney disease, or unspecified chronic kidney disease: Secondary | ICD-10-CM | POA: Insufficient documentation

## 2020-06-06 DIAGNOSIS — Z800 Family history of malignant neoplasm of digestive organs: Secondary | ICD-10-CM | POA: Insufficient documentation

## 2020-06-06 DIAGNOSIS — D494 Neoplasm of unspecified behavior of bladder: Secondary | ICD-10-CM | POA: Diagnosis not present

## 2020-06-06 DIAGNOSIS — Z7982 Long term (current) use of aspirin: Secondary | ICD-10-CM | POA: Insufficient documentation

## 2020-06-06 DIAGNOSIS — Z8673 Personal history of transient ischemic attack (TIA), and cerebral infarction without residual deficits: Secondary | ICD-10-CM | POA: Insufficient documentation

## 2020-06-06 DIAGNOSIS — Z833 Family history of diabetes mellitus: Secondary | ICD-10-CM | POA: Insufficient documentation

## 2020-06-06 DIAGNOSIS — Z8249 Family history of ischemic heart disease and other diseases of the circulatory system: Secondary | ICD-10-CM | POA: Diagnosis not present

## 2020-06-06 DIAGNOSIS — N183 Chronic kidney disease, stage 3 unspecified: Secondary | ICD-10-CM | POA: Diagnosis not present

## 2020-06-06 DIAGNOSIS — D090 Carcinoma in situ of bladder: Secondary | ICD-10-CM | POA: Diagnosis not present

## 2020-06-06 DIAGNOSIS — C674 Malignant neoplasm of posterior wall of bladder: Secondary | ICD-10-CM | POA: Insufficient documentation

## 2020-06-06 DIAGNOSIS — K219 Gastro-esophageal reflux disease without esophagitis: Secondary | ICD-10-CM | POA: Insufficient documentation

## 2020-06-06 DIAGNOSIS — D414 Neoplasm of uncertain behavior of bladder: Secondary | ICD-10-CM | POA: Diagnosis not present

## 2020-06-06 HISTORY — PX: TRANSURETHRAL RESECTION OF BLADDER TUMOR: SHX2575

## 2020-06-06 LAB — BASIC METABOLIC PANEL
Anion gap: 11 (ref 5–15)
BUN: 48 mg/dL — ABNORMAL HIGH (ref 8–23)
CO2: 24 mmol/L (ref 22–32)
Calcium: 9.9 mg/dL (ref 8.9–10.3)
Chloride: 103 mmol/L (ref 98–111)
Creatinine, Ser: 1.8 mg/dL — ABNORMAL HIGH (ref 0.44–1.00)
GFR, Estimated: 28 mL/min — ABNORMAL LOW (ref >60)
Glucose, Bld: 101 mg/dL — ABNORMAL HIGH (ref 70–99)
Potassium: 4.2 mmol/L (ref 3.5–5.1)
Sodium: 138 mmol/L (ref 135–145)

## 2020-06-06 SURGERY — TURBT (TRANSURETHRAL RESECTION OF BLADDER TUMOR)
Anesthesia: General

## 2020-06-06 MED ORDER — CHLORHEXIDINE GLUCONATE 0.12 % MT SOLN
15.0000 mL | Freq: Once | OROMUCOSAL | Status: AC
  Administered 2020-06-06: 15 mL via OROMUCOSAL

## 2020-06-06 MED ORDER — LACTATED RINGERS IV SOLN: INTRAVENOUS | Status: DC

## 2020-06-06 MED ORDER — DEXAMETHASONE SODIUM PHOSPHATE 10 MG/ML IJ SOLN
INTRAMUSCULAR | Status: DC | PRN
  Administered 2020-06-06: 4 mg via INTRAVENOUS

## 2020-06-06 MED ORDER — OXYBUTYNIN CHLORIDE 5 MG PO TABS
5.0000 mg | ORAL_TABLET | Freq: Three times a day (TID) | ORAL | 1 refills | Status: DC | PRN
Start: 2020-06-06 — End: 2021-07-19

## 2020-06-06 MED ORDER — LIDOCAINE 2% (20 MG/ML) 5 ML SYRINGE
INTRAMUSCULAR | Status: DC | PRN
  Administered 2020-06-06: 60 mg via INTRAVENOUS

## 2020-06-06 MED ORDER — CIPROFLOXACIN HCL 500 MG PO TABS: 500.0000 mg | ORAL_TABLET | Freq: Two times a day (BID) | ORAL | 0 refills | Status: AC

## 2020-06-06 MED ORDER — ROCURONIUM BROMIDE 10 MG/ML (PF) SYRINGE
PREFILLED_SYRINGE | INTRAVENOUS | Status: AC
  Filled 2020-06-06: qty 10

## 2020-06-06 MED ORDER — AMISULPRIDE (ANTIEMETIC) 5 MG/2ML IV SOLN: 10.0000 mg | Freq: Once | INTRAVENOUS | Status: DC | PRN

## 2020-06-06 MED ORDER — PHENAZOPYRIDINE HCL 200 MG PO TABS: 200.0000 mg | ORAL_TABLET | Freq: Three times a day (TID) | ORAL | 0 refills | Status: AC | PRN

## 2020-06-06 MED ORDER — PROPOFOL 10 MG/ML IV BOLUS
INTRAVENOUS | Status: DC | PRN
  Administered 2020-06-06: 100 mg via INTRAVENOUS

## 2020-06-06 MED ORDER — CIPROFLOXACIN IN D5W 400 MG/200ML IV SOLN
400.0000 mg | Freq: Once | INTRAVENOUS | Status: AC
  Administered 2020-06-06: 400 mg via INTRAVENOUS
  Filled 2020-06-06: qty 200

## 2020-06-06 MED ORDER — EPHEDRINE SULFATE-NACL 50-0.9 MG/10ML-% IV SOSY
PREFILLED_SYRINGE | INTRAVENOUS | Status: DC | PRN
  Administered 2020-06-06: 5 mg via INTRAVENOUS

## 2020-06-06 MED ORDER — FENTANYL CITRATE (PF) 100 MCG/2ML IJ SOLN
INTRAMUSCULAR | Status: DC | PRN
  Administered 2020-06-06: 75 ug via INTRAVENOUS

## 2020-06-06 MED ORDER — FENTANYL CITRATE (PF) 100 MCG/2ML IJ SOLN
INTRAMUSCULAR | Status: AC
  Filled 2020-06-06: qty 2

## 2020-06-06 MED ORDER — BELLADONNA ALKALOIDS-OPIUM 16.2-60 MG RE SUPP
RECTAL | Status: DC | PRN
  Administered 2020-06-06: 1 via RECTAL

## 2020-06-06 MED ORDER — EPHEDRINE 5 MG/ML INJ
INTRAVENOUS | Status: AC
  Filled 2020-06-06: qty 10

## 2020-06-06 MED ORDER — SODIUM CHLORIDE 0.9 % IR SOLN
Status: DC | PRN
  Administered 2020-06-06: 6000 mL

## 2020-06-06 MED ORDER — BELLADONNA ALKALOIDS-OPIUM 16.2-30 MG RE SUPP
RECTAL | Status: AC
  Filled 2020-06-06: qty 1

## 2020-06-06 MED ORDER — ORAL CARE MOUTH RINSE: 15.0000 mL | Freq: Once | OROMUCOSAL | Status: AC

## 2020-06-06 MED ORDER — IOHEXOL 300 MG/ML  SOLN
INTRAMUSCULAR | Status: DC | PRN
  Administered 2020-06-06: 10 mL

## 2020-06-06 MED ORDER — SUGAMMADEX SODIUM 200 MG/2ML IV SOLN
INTRAVENOUS | Status: DC | PRN
  Administered 2020-06-06: 150 mg via INTRAVENOUS

## 2020-06-06 MED ORDER — ONDANSETRON HCL 4 MG/2ML IJ SOLN
INTRAMUSCULAR | Status: DC | PRN
  Administered 2020-06-06: 4 mg via INTRAVENOUS

## 2020-06-06 MED ORDER — FENTANYL CITRATE (PF) 100 MCG/2ML IJ SOLN
25.0000 ug | INTRAMUSCULAR | Status: DC | PRN
  Administered 2020-06-06: 25 ug via INTRAVENOUS

## 2020-06-06 MED ORDER — GEMCITABINE CHEMO FOR BLADDER INSTILLATION 2000 MG
2000.0000 mg | Freq: Once | INTRAVENOUS | Status: AC
  Administered 2020-06-06: 2000 mg via INTRAVESICAL
  Filled 2020-06-06: qty 2000

## 2020-06-06 MED ORDER — TRAMADOL HCL 50 MG PO TABS: 50.0000 mg | ORAL_TABLET | Freq: Four times a day (QID) | ORAL | 0 refills | Status: AC | PRN

## 2020-06-06 MED ORDER — ROCURONIUM BROMIDE 10 MG/ML (PF) SYRINGE
PREFILLED_SYRINGE | INTRAVENOUS | Status: DC | PRN
  Administered 2020-06-06: 30 mg via INTRAVENOUS

## 2020-06-06 SURGICAL SUPPLY — 19 items
BAG URINE DRAIN 2000ML AR STRL (UROLOGICAL SUPPLIES) IMPLANT
BAG URO CATCHER STRL LF (MISCELLANEOUS) ×2 IMPLANT
CATH FOLEY 2WAY SLVR  5CC 16FR (CATHETERS) ×2
CATH FOLEY 2WAY SLVR 5CC 16FR (CATHETERS) ×1 IMPLANT
CATH URET 5FR 28IN OPEN ENDED (CATHETERS) ×2 IMPLANT
ELECT REM PT RETURN 15FT ADLT (MISCELLANEOUS) ×2 IMPLANT
GLOVE BIOGEL M STRL SZ7.5 (GLOVE) ×2 IMPLANT
GOWN STRL REUS W/TWL XL LVL3 (GOWN DISPOSABLE) ×2 IMPLANT
GUIDEWIRE ZIPWRE .038 STRAIGHT (WIRE) ×2 IMPLANT
KIT TURNOVER KIT A (KITS) ×2 IMPLANT
LOOP CUT BIPOLAR 24F LRG (ELECTROSURGICAL) ×2 IMPLANT
MANIFOLD NEPTUNE II (INSTRUMENTS) ×2 IMPLANT
PACK CYSTO (CUSTOM PROCEDURE TRAY) ×2 IMPLANT
PENCIL SMOKE EVACUATOR (MISCELLANEOUS) IMPLANT
STENT URET 6FRX24 CONTOUR (STENTS) ×2 IMPLANT
SYR TOOMEY IRRIG 70ML (MISCELLANEOUS) ×2
SYRINGE TOOMEY IRRIG 70ML (MISCELLANEOUS) ×1 IMPLANT
TUBING CONNECTING 10 (TUBING) ×2 IMPLANT
TUBING UROLOGY SET (TUBING) ×2 IMPLANT

## 2020-06-06 NOTE — Anesthesia Procedure Notes (Signed)
Procedure Name: Intubation Date/Time: 06/06/2020 1:24 PM Performed by: Lavina Hamman, CRNA Pre-anesthesia Checklist: Patient identified, Emergency Drugs available, Suction available, Patient being monitored and Timeout performed Patient Re-evaluated:Patient Re-evaluated prior to induction Oxygen Delivery Method: Circle system utilized Preoxygenation: Pre-oxygenation with 100% oxygen Induction Type: IV induction Ventilation: Mask ventilation without difficulty Laryngoscope Size: Mac and 3 Grade View: Grade II Tube type: Oral Tube size: 7.0 mm Number of attempts: 1 Airway Equipment and Method: Stylet Placement Confirmation: ETT inserted through vocal cords under direct vision,  positive ETCO2,  CO2 detector and breath sounds checked- equal and bilateral Secured at: 21 cm Tube secured with: Tape Dental Injury: Teeth and Oropharynx as per pre-operative assessment  Comments: ATOI

## 2020-06-06 NOTE — H&P (Signed)
Office Visit Report     05/18/2020   --------------------------------------------------------------------------------   Shannon Berger  MRN: [id number]  DOB: 12/14/1940, 79 year old Female  PRIMARY CARE:  Mark A. Joylene Draft, MD  REFERRING:    PROVIDER:  Ellison Hughs, M.D.  LOCATION:  [facility] [phone]     --------------------------------------------------------------------------------   CC/HPI: Bladder lesion   The patient is a 79 year old female who was found to have a 1.5 cm bladder lesion on renal/bladder ultrasound from 05/14/2020 during an evaluation for ongoing chronic kidney disease. She is a nonsmoker and has no personal/family history of GU malignancies. She states that she has been urinating at baseline and denies any recent episodes of gross hematuria.     ALLERGIES:     MEDICATIONS: Alprazolam 2 mg tablet Oral  Aspir 81 81 mg tablet, delayed release Oral  Atorvastatin Calcium 40 mg tablet  Bisoprolol-Hydrochlorothiazide 2.5-6.25 MG Oral Tablet Oral  Calcium + D3  Desvenlafaxine Er 100 mg tablet, extended release 24 hr  Fish Oil 120 mg-180 mg capsule Oral  Furosemide  Hydralazine Hcl 25 mg tablet  Multivitamins tablet Oral  Potassium  Senna  Tymlos 80 mcg/dose (3,120 mcg/1.56 ml) pen injector  Zinc     GU PSH: None   NON-GU PSH: Cataract surgery, Bilateral Fusion Spine Treat Retina     GU PMH: None     PMH Notes:  1898-07-08 00:00:00 - Note: Normal Routine History And Physical Senior Citizen (65-80)   NON-GU PMH: Anxiety Arthritis Depression Hypercholesterolemia Hypertension Stroke/TIA    FAMILY HISTORY: Colon Cancer - Mother Diabetes - Father Heart problem - Father   SOCIAL HISTORY: None   REVIEW OF SYSTEMS:    GU Review Female:   Patient reports frequent urination and leakage of urine. Patient denies hard to postpone urination, burning /pain with urination, get up at night to urinate, stream starts and  stops, trouble starting your stream, have to strain to urinate, and being pregnant.  Gastrointestinal (Upper):   Patient denies nausea, vomiting, and indigestion/ heartburn.  Gastrointestinal (Lower):   Patient denies diarrhea and constipation.  Constitutional:   Patient denies night sweats, fatigue, fever, and weight loss.  Skin:   Patient denies skin rash/ lesion and itching.  Eyes:   Patient denies blurred vision and double vision.  Ears/ Nose/ Throat:   Patient denies sore throat and sinus problems.  Hematologic/Lymphatic:   Patient denies swollen glands and easy bruising.  Cardiovascular:   Patient denies leg swelling and chest pains.  Respiratory:   Patient denies cough and shortness of breath.  Endocrine:   Patient denies excessive thirst.  Musculoskeletal:   Patient reports back pain. Patient denies joint pain.  Neurological:   Patient denies headaches and dizziness.  Psychologic:   Patient denies depression and anxiety.   Notes: Pt c/o UTI. Nocturia 1-2x per night.    VITAL SIGNS:      05/18/2020 03:28 PM  Weight 112 lb / 50.8 kg  Height 61 in / 154.94 cm  BP 148/86 mmHg  Heart Rate 61 /min  Temperature 98.0 F / 36.6 C  BMI 21.2 kg/m   GU PHYSICAL EXAMINATION:    External Genitalia: No hirsutism, no rash, no scarring, no cyst, no erythematous lesion, no papular lesion, no blanched lesion, no warty lesion. No edema.  Urethral Meatus: Normal size. Normal position. No discharge.  Urethra: No tenderness, no mass, no scarring. No hypermobility. No leakage.  Bladder: Normal to palpation, no tenderness, no mass, normal size.  Vagina: No atrophy, no stenosis. No rectocele. No cystocele. No enterocele.   MULTI-SYSTEM PHYSICAL EXAMINATION:    Constitutional: Well-nourished. No physical deformities. Normally developed. Good grooming.  Neurologic / Psychiatric: Oriented to time, oriented to place, oriented to person. No depression, no anxiety, no agitation.  Musculoskeletal: Normal  gait and station of head and neck.     Complexity of Data:  X-Ray Review: Renal Ultrasound: Reviewed Films. Reviewed Report. Discussed With Patient.    Notes:                     CLINICAL DATA: AKI, CKD     EXAM:  RENAL / URINARY TRACT ULTRASOUND COMPLETE     COMPARISON: None.     FINDINGS:  Right Kidney:     Renal measurements: 8.5 x 3.8 x 4.1 cm = volume: 69 mL. Echogenicity  is increased. No mass or hydronephrosis visualized.     Left Kidney:     Renal measurements: 9.1 x 4.3 x 3.7 cm = volume: 76 mL. Echogenicity  is increased. No mass or hydronephrosis visualized.     Bladder:     There is a small mass in the right bladder measuring 1.6 x 0.7 x 1.3  cm. Bilateral jets visualized.     Other:     None.     IMPRESSION:  1. Increased echogenicity of the bilateral kidneys as can be seen in  medical renal disease. No acute finding.     2. Small soft tissue mass in the right bladder measuring 1.6 cm.  Recommend direct visualization.     These results will be called to the ordering clinician or  representative by the Radiologist Assistant, and communication  documented in the PACS or [employer].        Electronically Signed  By: Audie Pinto M.D.  On: 05/14/2020 10:10   PROCEDURES:         Flexible Cystoscopy - 52000  Risks, benefits, and some of the potential complications of the procedure were discussed at length with the patient including infection, bleeding, voiding discomfort, urinary retention, fever, chills, sepsis, and others. All questions were answered. Informed consent was obtained. Antibiotic prophylaxis was given. Sterile technique and intraurethral analgesia were used.  Meatus:  Normal size. Normal location. Normal condition.  Urethra:  No hypermobility. No leakage.  Ureteral Orifices:  Normal location. Normal size. Normal shape. Effluxed clear urine.  Bladder:  A right lateral wall tumor. 1 1/2 cm tumor. No trabeculation. Normal mucosa. No  stones.      The lower urinary tract was carefully examined. The procedure was well-tolerated and without complications. Antibiotic instructions were given. Instructions were given to call the office immediately for bloody urine, difficulty urinating, urinary retention, painful or frequent urination, fever, chills, nausea, vomiting or other illness. The patient stated that she understood these instructions and would comply with them.         Urinalysis w/Scope Dipstick Dipstick Cont'd Micro  Color: Yellow Bilirubin: Neg mg/dL WBC/hpf: 0 - 5/hpf  Appearance: Clear Ketones: Neg mg/dL RBC/hpf: 0 - 2/hpf  Specific Gravity: 1.025 Blood: Neg ery/uL Bacteria: NS (Not Seen)  pH: <=5.0 Protein: 2+ mg/dL Cystals: NS (Not Seen)  Glucose: Neg mg/dL Urobilinogen: 1.0 mg/dL Casts: Hyaline    Nitrites: Neg Trichomonas: Not Present    Leukocyte Esterase: Trace leu/uL Mucous: Not Present      Epithelial Cells: 0 - 5/hpf      Yeast: NS (Not Seen)  Sperm: Not Present    Notes: unspun micro    ASSESSMENT:      ICD-10 Details  1 GU:   Bladder tumor/neoplasm - D41.4 Undiagnosed New Problem   PLAN:           Orders Labs Urine Culture          Schedule Return Visit/Planned Activity: Next Available Appointment - Office Visit, Follow up MD          Document Letter(s):  Created for Patient: Clinical Summary   Created for [facility] A. Perini, MD         Notes:   -Cystoscopy revealed a 1.5 cm papillary bladder tumor involving the right lateral wall with features concerning for urothelial carcinoma.  -The risks, benefits and alternatives of cystoscopy with TURBT with gemcitabine instillation was discussed with the patient. The risks include, but are not limited to, bleeding, urinary tract infection, bladder perforation requiring prolonged catheterization and/or open bladder repair, ureteral obstruction, voiding dysfunction and the inherent risks of general anesthesia. The patient voices understanding and  wishes to proceed.

## 2020-06-06 NOTE — Anesthesia Postprocedure Evaluation (Signed)
Anesthesia Post Note  Patient: Shannon Berger  Procedure(s) Performed: TRANSURETHRAL RESECTION OF BLADDER TUMOR(TURBT) WITH CYSTOSCOPY/ INSTILLATION OF GEMCITABINE WITH RIGHT STENT PLACEMENT (N/A )     Patient location during evaluation: PACU Anesthesia Type: General Level of consciousness: awake and alert Pain management: pain level controlled Vital Signs Assessment: post-procedure vital signs reviewed and stable Respiratory status: spontaneous breathing, nonlabored ventilation, respiratory function stable and patient connected to nasal cannula oxygen Cardiovascular status: blood pressure returned to baseline and stable Postop Assessment: no apparent nausea or vomiting Anesthetic complications: no   No complications documented.  Last Vitals:  Vitals:   06/06/20 1545 06/06/20 1622  BP: (!) 161/73 (!) 162/52  Pulse: 62 87  Resp: 11 12  Temp: (!) 36.3 C   SpO2: 99% 96%    Last Pain:  Vitals:   06/06/20 1622  TempSrc:   PainSc: 0-No pain                 Tiajuana Amass

## 2020-06-06 NOTE — Transfer of Care (Signed)
Immediate Anesthesia Transfer of Care Note  Patient: Shannon Berger  Procedure(s) Performed: Procedure(s): TRANSURETHRAL RESECTION OF BLADDER TUMOR(TURBT) WITH CYSTOSCOPY/ INSTILLATION OF GEMCITABINE WITH RIGHT STENT PLACEMENT (N/A)  Patient Location: PACU  Anesthesia Type:General  Level of Consciousness:  sedated, patient cooperative and responds to stimulation  Airway & Oxygen Therapy:Patient Spontanous Breathing and Patient connected to face mask oxgen  Post-op Assessment:  Report given to PACU RN and Post -op Vital signs reviewed and stable  Post vital signs:  Reviewed and stable  Last Vitals:  Vitals:   06/06/20 1139  BP: (!) 157/80  Pulse: 60  Resp: 16  Temp: 36.8 C  SpO2: 097%    Complications: No apparent anesthesia complications

## 2020-06-06 NOTE — Op Note (Signed)
Operative Note  Preoperative diagnosis:  1.  1.5 cm papillary bladder tumor involving the right posterior bladder wall  Postoperative diagnosis: 1.  1.5 cm papillary bladder tumor involving the right posterior bladder wall adjacent to the intraluminal portion of the right ureter  Procedure(s): 1.  Cystoscopy with TURBT (small) 2.  Right JJ stent placement 3.  Right retrograde pyelogram with intraoperative interpretation of fluoroscopic imaging 4.  Intravesical instillation of gemcitabine  Surgeon: Ellison Hughs, MD  Assistants:  None  Anesthesia:  General  Complications:  None  EBL: Less than 5 mL  Specimens: 1.  Superficial and deep margins of right posterior bladder wall tumor  Drains/Catheters: 1.  Right 6 French, 24 cm JJ stent without tether  Intraoperative findings:   1. 1.5 cm papillary bladder tumor involving the right posterior bladder wall immediately adjacent to the intraluminal portion of the right ureter.  Due to the resection being in such close proximity to the intraluminal portion of the right ureter, the decision was made to leave a ureteral stent. 2. Right retrograde pyelogram revealed no filling defects along the length of the right ureter nor within the right renal pelvis.  Indication:  Shannon Berger is a 79 y.o. female with a 1.5 cm bladder tumor that was incidentally identified during a renal/bladder ultrasound on 05/14/2020.  She had an office cystoscopy that confirmed the presence of a papillary bladder tumor.  She has been consented for the above procedures, voices understanding and wishes to proceed.  Description of procedure:  After informed consent was obtained, the patient was brought to the operating room and general LMA anesthesia was administered. The patient was then placed in the dorsolithotomy position and prepped and draped in the usual sterile fashion. A timeout was performed. A 23 French rigid cystoscope was then inserted into  the urethral meatus and advanced into the bladder under direct vision. A complete bladder survey revealed the findings listed above.  The rigid cystoscope was then exchanged for a 26 French resectoscope with a bipolar loop working element.  The papillary bladder tumor involving the right posterior bladder wall was then resected superficially.  The superficial margin was then sent off for permanent section.  I then took a deeper margin down to the detrusor musculature and sent that specimen off for permanent section as well.  There was no gross evidence of bladder perforation intraoperatively.  Due to the close proximity of the intraluminal portion of the right ureter and the need to coagulate the resection bed, I made the decision to place a right ureteral stent.    A 5 French ureteral catheter was then inserted into the right ureteral orifice and a retrograde pyelogram was obtained, with the findings listed above.  A Glidewire was then used to intubate the lumen of the ureteral catheter and was advanced up to the right renal pelvis, under fluoroscopic guidance.  The catheter was then removed, leaving the wire in place.  A 6 French, 24 cm JJ stent was then placed over the wire and into good position within the right collecting system, confirming placement via fluoroscopy.  A 16 French Foley catheter was then placed.  While in the recovery room 2000 mg of gemcitabine in 50 mL of water was instilled in the bladder through the catheter and the catheter was plugged. This will remain indwelling for approximately one hour. It will then be drained from the bladder and the catheter will be removed and the patient discharged home.  Plan: Follow-up in  2 weeks to discuss pathology results and remove her right ureteral stent

## 2020-06-06 NOTE — Anesthesia Preprocedure Evaluation (Addendum)
Anesthesia Evaluation  Patient identified by MRN, date of birth, ID band Patient awake    Reviewed: Allergy & Precautions, NPO status , Patient's Chart, lab work & pertinent test results  Airway Mallampati: I  TM Distance: >3 FB Neck ROM: Full    Dental  (+) Dental Advisory Given   Pulmonary sleep apnea ,    breath sounds clear to auscultation       Cardiovascular hypertension, Pt. on home beta blockers and Pt. on medications  Rhythm:Regular Rate:Normal     Neuro/Psych TIACVA    GI/Hepatic Neg liver ROS, GERD  ,  Endo/Other  negative endocrine ROS  Renal/GU CRFRenal disease     Musculoskeletal  (+) Arthritis ,   Abdominal   Peds  Hematology negative hematology ROS (+)   Anesthesia Other Findings   Reproductive/Obstetrics                            Anesthesia Physical Anesthesia Plan  ASA: III  Anesthesia Plan: General   Post-op Pain Management:    Induction: Intravenous  PONV Risk Score and Plan: 3 and Dexamethasone, Ondansetron and Treatment may vary due to age or medical condition  Airway Management Planned: Oral ETT  Additional Equipment: None  Intra-op Plan:   Post-operative Plan: Extubation in OR  Informed Consent: I have reviewed the patients History and Physical, chart, labs and discussed the procedure including the risks, benefits and alternatives for the proposed anesthesia with the patient or authorized representative who has indicated his/her understanding and acceptance.     Dental advisory given  Plan Discussed with: CRNA  Anesthesia Plan Comments:         Anesthesia Quick Evaluation

## 2020-06-07 ENCOUNTER — Encounter (HOSPITAL_COMMUNITY): Payer: Self-pay | Admitting: Urology

## 2020-06-07 LAB — SURGICAL PATHOLOGY

## 2020-06-08 ENCOUNTER — Encounter: Payer: Self-pay | Admitting: Obstetrics & Gynecology

## 2020-06-08 ENCOUNTER — Ambulatory Visit (INDEPENDENT_AMBULATORY_CARE_PROVIDER_SITE_OTHER): Payer: Medicare Other | Admitting: Obstetrics & Gynecology

## 2020-06-08 ENCOUNTER — Other Ambulatory Visit: Payer: Self-pay

## 2020-06-08 VITALS — BP 134/78 | Ht 60.25 in | Wt 113.0 lb

## 2020-06-08 DIAGNOSIS — Z01419 Encounter for gynecological examination (general) (routine) without abnormal findings: Secondary | ICD-10-CM

## 2020-06-08 DIAGNOSIS — M810 Age-related osteoporosis without current pathological fracture: Secondary | ICD-10-CM

## 2020-06-08 DIAGNOSIS — Z780 Asymptomatic menopausal state: Secondary | ICD-10-CM

## 2020-06-08 NOTE — Progress Notes (Signed)
Shannon Berger [birth year] [id number]   History:    79 y.o. G2P2L2 Married  TO:[id number] presenting for annual gyn exam   LZJ:[id number], well on no HRT. No PMB. No pelvic pain. Abstinent. SUI stable, wears a pad. Tendency for constipation, on Senokot. Breasts wnl. BMI 21.89.  Early bladder cancer excised 2 days ago.  Renal artery stenosis, referred to CV surgeon. Health labs with Fam MD.  Osteoporosis on Tymlos x 2 yrs, followed by Dr Joylene Draft.  Colono 2021.  Past medical history,surgical history, family history and social history were all reviewed and documented in the EPIC chart.  Gynecologic History No LMP recorded. Patient is postmenopausal.  Obstetric History OB History  Gravida Para Term Preterm AB Living  2 2       2   SAB TAB Ectopic Multiple Live Births               # Outcome Date GA Lbr Len/2nd Weight Sex Delivery Anes PTL Lv  2 Para           1 Para              ROS: A ROS was performed and pertinent positives and negatives are included in the history.  GENERAL: No fevers or chills. HEENT: No change in vision, no earache, sore throat or sinus congestion. NECK: No pain or stiffness. CARDIOVASCULAR: No chest pain or pressure. No palpitations. PULMONARY: No shortness of breath, cough or wheeze. GASTROINTESTINAL: No abdominal pain, nausea, vomiting or diarrhea, melena or bright red blood per rectum. GENITOURINARY: No urinary frequency, urgency, hesitancy or dysuria. MUSCULOSKELETAL: No joint or muscle pain, no back pain, no recent trauma. DERMATOLOGIC: No rash, no itching, no lesions. ENDOCRINE: No polyuria, polydipsia, no heat or cold intolerance. No recent change in weight. HEMATOLOGICAL: No anemia or easy bruising or bleeding. NEUROLOGIC: No headache, seizures, numbness, tingling or weakness. PSYCHIATRIC: No depression, no loss of interest in normal activity or change in sleep pattern.     Exam:   BP 134/78   Ht 5' 0.25" (1.53 m)   Wt  113 lb (51.3 kg)   BMI 21.89 kg/m   Body mass index is 21.89 kg/m.  General appearance : Well developed well nourished female. No acute distress HEENT: Eyes: no retinal hemorrhage or exudates,  Neck supple, trachea midline, no carotid bruits, no thyroidmegaly Lungs: Clear to auscultation, no rhonchi or wheezes, or rib retractions  Heart: Regular rate and rhythm, no murmurs or gallops Breast:Examined in sitting and supine position were symmetrical in appearance, no palpable masses or tenderness,  no skin retraction, no nipple inversion, no nipple discharge, no skin discoloration, no axillary or supraclavicular lymphadenopathy Abdomen: no palpable masses or tenderness, no rebound or guarding Extremities: no edema or skin discoloration or tenderness  Pelvic: Vulva: Normal             Vagina: No gross lesions or discharge  Cervix: No gross lesions or discharge  Uterus  AV, normal size, shape and consistency, non-tender and mobile  Adnexa  Without masses or tenderness  Anus: Normal   Assessment/Plan:  79 y.o. female for annual exam   1. Well female exam with routine gynecological exam Normal gynecologic exam in menopause.  No indication for Pap test at this time.  Breast exam normal.  Screening mammogram June 2021 was negative.  Colonoscopy 2015.  Health labs with Dr. Joylene Draft.  2. Post-menopause Well on no hormone replacement therapy.  No postmenopausal bleeding.  3. Age-related osteoporosis without current  pathological fracture Osteoporosis on Tymlos x 2 yrs.  Vitamin D supplements, calcium intake of 1500 mg daily and regular weightbearing physical activities recommended.  Followed by Dr. Joylene Draft.  Princess Bruins MD, 2:28 PM 06/08/2020

## 2020-06-09 ENCOUNTER — Encounter: Payer: Self-pay | Admitting: Obstetrics & Gynecology

## 2020-06-11 DIAGNOSIS — Z20822 Contact with and (suspected) exposure to covid-19: Secondary | ICD-10-CM | POA: Diagnosis not present

## 2020-06-16 ENCOUNTER — Ambulatory Visit (INDEPENDENT_AMBULATORY_CARE_PROVIDER_SITE_OTHER): Payer: Medicare Other | Admitting: Neurology

## 2020-06-16 ENCOUNTER — Other Ambulatory Visit: Payer: Self-pay

## 2020-06-16 DIAGNOSIS — F5104 Psychophysiologic insomnia: Secondary | ICD-10-CM

## 2020-06-16 DIAGNOSIS — G4733 Obstructive sleep apnea (adult) (pediatric): Secondary | ICD-10-CM

## 2020-06-16 DIAGNOSIS — I6521 Occlusion and stenosis of right carotid artery: Secondary | ICD-10-CM

## 2020-06-16 DIAGNOSIS — I493 Ventricular premature depolarization: Secondary | ICD-10-CM

## 2020-06-19 ENCOUNTER — Telehealth: Payer: Self-pay

## 2020-06-19 NOTE — Telephone Encounter (Signed)
Patient called into office requesting a sooner appt with Dr. [name] due to a recent finding with Dr. Joylene Draft. She states he told her that she has a blockage in her kidney's and she is scheduled to leave for a trip on Christmas eve but would like to know if it is safe to travel with her having a Kidney blockage. I advised that Dr. [name] does not discuss results over the phone and she will need to call back to see if she is able to be seen sooner or keep the scheduled appt or she could reach out to Dr. Silvestre Mesi office to get recommendations on if she is safe to travel. Patient voiced her understanding.

## 2020-06-20 ENCOUNTER — Telehealth: Payer: Self-pay | Admitting: Neurology

## 2020-06-20 DIAGNOSIS — H6123 Impacted cerumen, bilateral: Secondary | ICD-10-CM | POA: Diagnosis not present

## 2020-06-20 NOTE — Progress Notes (Signed)
I will write an auto CPAP order for above named settings of 6 through 13 cm water with 3 cm EPR. The patient was fitted with a ResMed P10 small nasal pillows - mask. Heated humidification will be added.   1. Any apnea patient should avoid sedatives, hypnotics, and alcohol consumption at bedtime. 2. CPAP therapy compliance is defined as 4 hours or more of nightly use.  DISCUSSION: A follow up appointment will be scheduled in the Sleep Clinic at [facility].   Please call [phone] with any questions.

## 2020-06-20 NOTE — Telephone Encounter (Signed)
-----   Message from Larey Seat, MD sent at 06/20/2020  1:11 PM EST -----  I will write an auto CPAP order for above named settings of 6 through 13 cm water with 3 cm EPR. The patient was fitted with a ResMed P10 small nasal pillows - mask. Heated humidification will be added.   1. Any apnea patient should avoid sedatives, hypnotics, and alcohol consumption at bedtime. 2. CPAP therapy compliance is defined as 4 hours or more of nightly use.  DISCUSSION: A follow up appointment will be scheduled in the Sleep Clinic at [facility].   Please call [phone] with any questions.

## 2020-06-20 NOTE — Telephone Encounter (Signed)
Called patient to discuss sleep study results. No answer at this time. LVM for the patient to call back.   

## 2020-06-20 NOTE — Procedures (Signed)
PATIENT'S NAME:  Shannon Berger, Shannon Berger DOB:      1941-06-06      MR#:    [id number]     DATE OF RECORDING: 06/16/2020  B. Otto Herb M.D.:  Larey Seat, MD Study Performed:   CPAP  Titration HISTORY:  Shannon Berger was seen on 04-24-2020, she is a right-handed Stroke patient with a possible sleep disorder. She had a sleep study in hospital and couldn't sleep there, so validity of data was in question. Here to check for OSA. Returning after HST was repeated and showed Sleep Apnea. The new HST on 05-15-2020 indicated an moderate- severe sleep apnea at AHI of 25/h with REM sleep AHI of 36.2/h. apnea was almost equal obstructive and central, complex form. There was no prolonged hypoxia of sleep. Strongly supine dependent apnea. This patient may fare best with an attended CPAP titration, given the central AHI of 13/h, almost 50% of overall apneas and hypopneas.    The patient endorsed the Epworth Sleepiness Scale at 5 points.   The patient's weight 115 pounds with a height of 61 (inches), resulting in a BMI of 21.6 kg/m2. The patient's neck circumference measured 13 inches.  CURRENT MEDICATIONS: Ditropan, Thymol's, Tylenol, Xanax, Lipitor, Zebeta, Zetia, Multivitamin, Benicar, Omega 3, Klor-Con, Zinc gluconate, Vit D, Lasix, Apresoline, Lamictal, Pyridium,    PROCEDURE:  This is a multichannel digital polysomnogram utilizing the SomnoStar 11.2 system.  Electrodes and sensors were applied and monitored per AASM Specifications.   EEG, EOG, Chin and Limb EMG, were sampled at 200 Hz.  ECG, Snore and Nasal Pressure, Thermal Airflow, Respiratory Effort, CPAP Flow and Pressure, Oximetry was sampled at 50 Hz. Digital video and audio were recorded.       CPAP was initiated at 4 cmH20 with heated humidity per AASM split night standards and pressure was advanced to 10 cmH20, with an EPR setting of 3, because of hypopneas, apneas and desaturations.  At a PAP pressure of 10 cmH20, there was a  reduction of the AHI to 2.4/h with improvement of sleep apnea.  Lights Out was at 23:08 and Lights On at 04:47. Total recording time (TRT) was 333.5 minutes, with a total sleep time (TST) of 290.5 minutes. The patient's sleep latency was 5 minutes. REM latency was 189 minutes.  The sleep efficiency was 87.1 %.    SLEEP ARCHITECTURE: WASO (Wake after sleep onset) was 28.5 minutes.  There were 26 minutes in Stage N1, 175.5 minutes Stage N2, 45 minutes Stage N3 and 44 minutes in Stage REM.  The percentage of Stage N1 was 9.%, Stage N2 was 60.4%, Stage N3 was 15.5% and Stage R (REM sleep) was 15.1%.   RESPIRATORY ANALYSIS:  There was a total of 49 respiratory events: 1 obstructive apnea, 0 central apneas and 0 mixed apneas with a total of 1 apnea and an apnea index (AI) of 0.2 /hour. There were 48 hypopneas with a hypopnea index of 9.9/hour.   The total APNEA/HYPOPNEA INDEX  (AHI) was 10.1 /hour .  11 events occurred in REM sleep and 38 events in NREM. The REM AHI was 15 /hour versus a non-REM AHI of 9.2 /hour.  The patient spent 139.5 minutes of total sleep time in the supine position and 151 minutes in non-supine. The supine AHI was 12.4, versus a non-supine AHI of 7.9.  OXYGEN SATURATION & C02:  The baseline 02 saturation was 93%, with the lowest being 81%. Time spent below 89% saturation equaled 3 minutes. The arousals were  noted as: 34 were spontaneous, 0 were associated with PLMs, 34 were associated with respiratory events. The patient had a total of 0 Periodic Limb Movements.   Audio and video analysis did not show any abnormal or unusual movements, behaviors, phonations or vocalizations. EKG was in keeping with normal sinus rhythm (NSR) and some PVCs.   DIAGNOSIS : Complex Central and Obstructive Sleep Apnea responded in lab to CPAP at 10 cm water with 3 cm EPR. The patient was fitted with a ResMed P10 small nasal pillows - mask. Heated humidification will be added.   PLANS/RECOMMENDATIONS: I  will write an auto CPAP order for above named settings of 6 through 13 cm water with 3 cm EPR. The patient was fitted with a ResMed P10 small nasal pillows - mask. Heated humidification will be added.   1. Any apnea patient should avoid sedatives, hypnotics, and alcohol consumption at bedtime. 2. CPAP therapy compliance is defined as 4 hours or more of nightly use.  DISCUSSION: A follow up appointment will be scheduled in the Sleep Clinic at [facility].   Please call [phone] with any questions.     I certify that I have reviewed the entire raw data recording prior to the issuance of this report in accordance with the Standards of Accreditation of the American Academy of Sleep Medicine (AASM)  Larey Seat, M.D. Diplomat, Tax adviser of Psychiatry and Neurology  Diplomat, Tax adviser of Sleep Medicine Market researcher, [employer] [facility] at [employer]

## 2020-06-20 NOTE — Addendum Note (Signed)
Addended by: Larey Seat on: 06/20/2020 01:11 PM   Modules accepted: Orders

## 2020-06-21 NOTE — Telephone Encounter (Signed)
I called pt. I advised pt that Dr. Brett Fairy reviewed their sleep study results and found that pt best tolerated CPAP at pressure of 10 cm water pressure. Dr. Brett Fairy recommends that pt starts auto CPAP. I reviewed PAP compliance expectations with the pt. Pt is agreeable to starting a CPAP. I advised pt that an order will be sent to a DME, [facility], and [facility] will call the pt within about one week after they file with the pt's insurance. [facility] will show the pt how to use the machine, fit for masks, and troubleshoot the CPAP if needed. A follow up appt will need to be made for insurance purposes with Dr. Brett Fairy or NP. Pt verbalized understanding to [facility] letter with all of this information in it will be mailed to the pt as a reminder. I verified with the pt that the address we have on file is correct. Pt verbalized understanding of results. Pt had no questions at this time but was encouraged to call back if questions arise. I have sent the order to [facility] and have received confirmation that they have received the order.

## 2020-06-23 DIAGNOSIS — C674 Malignant neoplasm of posterior wall of bladder: Secondary | ICD-10-CM | POA: Diagnosis not present

## 2020-06-27 DIAGNOSIS — Z1212 Encounter for screening for malignant neoplasm of rectum: Secondary | ICD-10-CM | POA: Diagnosis not present

## 2020-06-28 DIAGNOSIS — F3341 Major depressive disorder, recurrent, in partial remission: Secondary | ICD-10-CM | POA: Diagnosis not present

## 2020-06-28 DIAGNOSIS — F411 Generalized anxiety disorder: Secondary | ICD-10-CM | POA: Diagnosis not present

## 2020-07-12 ENCOUNTER — Telehealth: Payer: Self-pay | Admitting: Cardiology

## 2020-07-12 ENCOUNTER — Ambulatory Visit: Payer: Medicare Other | Admitting: General Practice

## 2020-07-12 ENCOUNTER — Other Ambulatory Visit: Payer: Self-pay

## 2020-07-12 ENCOUNTER — Encounter: Payer: Self-pay | Admitting: General Practice

## 2020-07-12 VITALS — BP 170/80 | HR 53 | Ht 61.0 in | Wt 115.0 lb

## 2020-07-12 DIAGNOSIS — E785 Hyperlipidemia, unspecified: Secondary | ICD-10-CM

## 2020-07-12 DIAGNOSIS — I10 Essential (primary) hypertension: Secondary | ICD-10-CM | POA: Diagnosis not present

## 2020-07-12 DIAGNOSIS — I6523 Occlusion and stenosis of bilateral carotid arteries: Secondary | ICD-10-CM

## 2020-07-12 DIAGNOSIS — F419 Anxiety disorder, unspecified: Secondary | ICD-10-CM

## 2020-07-12 DIAGNOSIS — R0789 Other chest pain: Secondary | ICD-10-CM

## 2020-07-12 NOTE — Progress Notes (Signed)
Cardiology Clinic Note   Patient Name: Shannon Berger Date of Encounter: 07/12/2020  Primary Care Provider:  [name], MD Primary Cardiologist:  Peter Martinique, MD  Patient Profile    Shannon Berger 80 year old female presents to the clinic today for an evaluation of her chest pain.  Past Medical History    Past Medical History:  Diagnosis Date  . Anginal pain ([place])    occasionally  . Anxiety   . Arthritis   . Carotid arterial disease ([place])   . Chronic kidney disease   . Complication of anesthesia    "hallucinates for days; have a hard time waking up; almost couldn't get her awake last time; wake up wild" (05/26/2018)  . Constipation   . Degeneration of lumbar intervertebral disc   . Dislocation of hip joint prosthesis ([place])   . Essential tremor   . Fusion of spine of lumbar region   . Heart murmur    "slight one" (05/26/2018)  . Hyperlipidemia   . Hypertension   . Low back pain radiating to left lower extremity   . Melanoma ([place])    "center of upper chest"  . Osteoarthritis   . Osteopenia   . Sleep difficulties   . Spinal stenosis at L4-L5 level   . Stroke ([place])    tia--last week  . TIA (transient ischemic attack) 05/20/2018   Past Surgical History:  Procedure Laterality Date  . ANTERIOR LUMBAR FUSION     added rods and screws;  [facility] Kitchen BACK SURGERY    . BREAST BIOPSY Left    "not cancer"  . BUNIONECTOMY Left   . CAROTID ENDARTERECTOMY Left 05/26/2018  . CATARACT EXTRACTION W/ INTRAOCULAR LENS  IMPLANT, BILATERAL Bilateral   . ENDARTERECTOMY Left 05/26/2018   Procedure: ENDARTERECTOMY CAROTID LEFT;  Surgeon: Angelia Mould, MD;  Location: [facility];  Service: Vascular;  Laterality: Left;  . EYE SURGERY Right    "retina wrinkle OR; now can't see out of that eye" (05/26/2018)  . JOINT REPLACEMENT Left 2014   Hip  . LUMBAR SPINE HARDWARE REMOVAL     "went in thru the front"  . MELANOMA EXCISION     "center of upper chest"  . PATCH  ANGIOPLASTY Left 05/26/2018   Procedure: PATCH ANGIOPLASTY LEFT CAROTID ARTERY;  Surgeon: Angelia Mould, MD;  Location: [facility];  Service: Vascular;  Laterality: Left;  . PILONIDAL CYST DRAINAGE    . POSTERIOR LUMBAR FUSION  X 2  . TENDON REPAIR Left    Reattachment of tendon- Left hip  . THUMB FUSION Right   . TONSILLECTOMY AND ADENOIDECTOMY    . TOTAL HIP ARTHROPLASTY Left 09/04/2012   Procedure: LEFT TOTAL HIP ARTHROPLASTY ANTERIOR APPROACH;  Surgeon: Mcarthur Rossetti, MD;  Location: [facility];  Service: Orthopedics;  Laterality: Left;  Left Total Hip Arthroplasty, Anterior Approach  . TRANSURETHRAL RESECTION OF BLADDER TUMOR N/A 06/06/2020   Procedure: TRANSURETHRAL RESECTION OF BLADDER TUMOR(TURBT) WITH CYSTOSCOPY/ INSTILLATION OF GEMCITABINE WITH RIGHT STENT PLACEMENT;  Surgeon: Ceasar Mons, MD;  Location: [facility];  Service: Urology;  Laterality: N/A;    Allergies  Allergies  Allergen Reactions  . Oxycodone-Acetaminophen     hallucinations  . Versed [Midazolam] Other (See Comments)    Had trouble waking up   . Hydrocodone-Acetaminophen     hallucination     History of Present Illness    Ms. Sugai has a PMH of nonobstructive CAD on coronary CTA, hypertension, hyperlipidemia, carotid artery disease status post  left CEA 2019, CVA, and ischemic optic neuropathy.  She was last seen by Roby Lofts, PA-C on 07/26/2019.  During that time she reported she felt fairly well.  She noted occasional aches in her chest that would last for seconds and occur without any pattern.  She felt that her discomfort was related to stress/anxiety.  She indicated that 2020 had been a stressful year.  She continued to have issues with her back and hip which limited her physical activity.  She denied shortness of breath, dyspnea, lower extremity edema, dizziness, lightheadedness, and syncope.  She was working with her PCP to achieve better blood pressure control.  She had been  started on amlodipine 5 mg daily 06/2019.  She reported her blood pressure over the last 2 weeks have been in the 140s over 80s.  She contacted the nurse triage line today 07/12/2020.  She reported that she has been having chest pain on her left side off and on for the last 30 days.  She describes the pain as achy/dull, not heavy, no pressure and no shooting or sharp pain.  She denies shortness of breath, nausea/vomiting.  She is unsure if it was related to stress or musculoskeletal in nature.  She presents the clinic today for further evaluation and states she has had occasional intermittent episodes of chest discomfort in the mornings.  She states that dull ache will last for seconds to a minute and dissipate on its own.  She denies exertional chest pain.  She does note increased anxiety at times.  She reports she has been working with her PCP on her blood pressure.  She recently had a renal ultrasound and she has a visit scheduled with [facility] to talk about renal artery interventions.  I will give the salty 6 diet sheet, have her increase her physical activity as tolerated, give her the mindful stress reduction sheet and have her follow-up with Dr. Martinique in 6 months and as needed.  Today she denies chest pain, shortness of breath, lower extremity edema, fatigue, palpitations, melena, hematuria, hemoptysis, diaphoresis, weakness, presyncope, syncope, orthopnea, and PND.   Home Medications    Prior to Admission medications   Medication Sig Start Date End Date Taking? Authorizing Provider  Abaloparatide (TYMLOS [place]) Inject 80 mcg into the skin daily.     [provider]  acetaminophen (TYLENOL) 500 MG tablet Take 500 mg by mouth every 6 (six) hours as needed for moderate pain or headache.    [provider]  ALPRAZolam Duanne Moron) 0.5 MG tablet Take 0.25 mg by mouth at bedtime as needed for sleep.  06/08/19   [provider]  Ascorbic Acid (VITAMIN C) 1000 MG tablet Take 1,000 mg by  mouth 2 (two) times daily.    [provider]  aspirin EC 325 MG EC tablet Take 1 tablet (325 mg total) by mouth daily. 05/22/18   Amin, Jeanella Flattery, MD  atorvastatin (LIPITOR) 40 MG tablet Take 40 mg by mouth daily. 01/29/15   [provider]  bisoprolol (ZEBETA) 10 MG tablet Take 10 mg by mouth daily. 07/15/19   [provider]  Calcium Carbonate-Vit D-Min (CALCIUM 1200 PO) Take 1 tablet by mouth daily.    [provider]  Cholecalciferol (VITAMIN D) 2000 units tablet Take 2,000 Units by mouth daily.     [provider]  desvenlafaxine (PRISTIQ) 100 MG 24 hr tablet Take 100 mg by mouth daily.    [provider]  ezetimibe (ZETIA) 10 MG tablet  Take 10 mg by mouth daily.    [provider]  furosemide (LASIX) 20 MG tablet Take 20 mg by mouth every Monday, Wednesday, and Friday.    [provider]  hydrALAZINE (APRESOLINE) 25 MG tablet Take 25 mg by mouth 2 (two) times daily.    [provider]  lamoTRIgine (LAMICTAL) 25 MG tablet Take 25-75 mg by mouth See admin instructions. Take 25 mg at night for 2 weeks, then 50 mg at night for 2 weeks, then 75 mg at night 05/17/20   [provider]  Multiple Vitamin (MULTIVITAMIN) tablet Take 1 tablet by mouth daily.      [provider]  olmesartan (BENICAR) 40 MG tablet Take 40 mg by mouth daily. 03/15/20   [provider]  Omega-3 Fatty Acids (FISH OIL PO) Take 1,400 mg by mouth 2 (two) times daily.     [provider]  oxybutynin (DITROPAN) 5 MG tablet Take 1 tablet (5 mg total) by mouth every 8 (eight) hours as needed for bladder spasms. 06/06/20   Rene Paci, MD  phenazopyridine (PYRIDIUM) 200 MG tablet Take 1 tablet (200 mg total) by mouth 3 (three) times daily as needed (for pain with urination). 06/06/20 06/06/21  Rene Paci, MD  potassium chloride SA (KLOR-CON) 20 MEQ tablet Take 20 mEq by mouth every Monday,  Wednesday, and Friday.    [provider]  zinc gluconate 50 MG tablet Take 50 mg by mouth daily.    [provider]    Family History    Family History  Problem Relation Age of Onset  . Heart failure Mother   . Cancer Mother        colon  . Hypertension Mother   . Other Mother        Varicose Veins  . Diabetes Father   . Heart disease Father   . Hypertension Father   . Heart attack Father   . Cancer Brother        squamos cell - head, neck, ear  . Healthy Son   . Migraines Daughter    She indicated that her mother is deceased. She indicated that her father is deceased. She indicated that her brother is alive. She indicated that her maternal grandmother is deceased. She indicated that her maternal grandfather is deceased. She indicated that her paternal grandmother is deceased. She indicated that her paternal grandfather is deceased. She indicated that her daughter is alive. She indicated that her son is alive.  Social History    Social History   Socioeconomic History  . Marital status: Married    Spouse name: jerry  . Number of children: 2  . Years of education: Not on file  . Highest education level: Bachelor's degree (e.g., BA, AB, BS)  Occupational History  . Occupation: Research officer, political party: RETIRED    Comment: retired  Tobacco Use  . Smoking status: Never Smoker  . Smokeless tobacco: Never Used  Vaping Use  . Vaping Use: Never used  Substance and Sexual Activity  . Alcohol use: Yes    Alcohol/week: 1.0 standard drink    Types: 1 Glasses of wine per week    Comment: Occas.  . Drug use: Never  . Sexual activity: Not Currently    Partners: Male    Comment: 1st intercourse- 46, partners- 1, married- 53 yrs   Other Topics Concern  . Not on file  Social History Narrative   Pt lives at home  with spouse jerry, 2 story home has 2 children   She is right handed, she drinks ginger ale and come tea, no coffee, or soda   Social  Determinants of Health   Financial Resource Strain: Not on file  Food Insecurity: Not on file  Transportation Needs: Not on file  Physical Activity: Not on file  Stress: Not on file  Social Connections: Not on file  Intimate Partner Violence: Not on file     Review of Systems    General:  No chills, fever, night sweats or weight changes.  Cardiovascular:  No chest pain, dyspnea on exertion, edema, orthopnea, palpitations, paroxysmal nocturnal dyspnea. Dermatological: No rash, lesions/masses Respiratory: No cough, dyspnea Urologic: No hematuria, dysuria Abdominal:   No nausea, vomiting, diarrhea, bright red blood per rectum, melena, or hematemesis Neurologic:  No visual changes, wkns, changes in mental status. All other systems reviewed and are otherwise negative except as noted above.  Physical Exam    VS:  BP (!) 170/80 (BP Location: Right Arm, Patient Position: Sitting, Cuff Size: Normal)   Pulse (!) 53   Ht 5\' 1"  (1.549 m)   Wt 115 lb (52.2 kg)   SpO2 94%   BMI 21.73 kg/m  , BMI Body mass index is 21.73 kg/m. GEN: Well nourished, well developed, in no acute distress. HEENT: normal. Neck: Supple, no JVD, carotid bruits, or masses. Cardiac: RRR, no murmurs, rubs, or gallops. No clubbing, cyanosis, edema.  Radials/DP/PT 2+ and equal bilaterally.  Respiratory:  Respirations regular and unlabored, clear to auscultation bilaterally. GI: Soft, nontender, nondistended, BS + x 4. MS: no deformity or atrophy. Skin: warm and dry, no rash. Neuro:  Strength and sensation are intact. Psych: Normal affect.  Accessory Clinical Findings    Recent Labs: 05/24/2020: Hemoglobin 12.5; Platelets 287 06/06/2020: BUN 48; Creatinine, Ser 1.80; Potassium 4.2; Sodium 138   Recent Lipid Panel    Component Value Date/Time   CHOL 174 05/21/2018 0412   TRIG 59 05/21/2018 0412   HDL 71 05/21/2018 0412   CHOLHDL 2.5 05/21/2018 0412   VLDL 12 05/21/2018 0412   LDLCALC 91 05/21/2018 0412     ECG personally reviewed by me today-normal sinus rhythm 62 bpm no ST or T wave deviation- No acute changes  Coronary CTA 11/19 1. Coronary artery calcium score 260 Agatston units. This places the patient in the 73rd percentile for age and gender, suggesting intermediate risk for future cardiac events.  2. Nonobstructive CAD  Echocardiogram 05/21/2018 Left ventricle: The cavity size was normal. Wall thickness was normal. Systolic function was normal. The estimated ejection fraction was in the range of 60% to 65%. Wall motion was normal; there were no regional wall motion abnormalities. Features are consistent with a pseudonormal left ventricular filling pattern, with concomitant abnormal relaxation and increased filling pressure (grade 2 diastolic dysfunction). - Mitral valve: There was mild regurgitation. - Pulmonary arteries: Systolic pressure was mildly increased. PA peak pressure: 34 mm Hg (S).  Carotid ultrasound 07/14/2019 Right Carotid: Velocities in the right ICA are consistent with a 1-39% stenosis.  Left Carotid: Velocities in the left ICA are consistent with a 1-39% stenosis.  Vertebrals: Bilateral vertebral arteries demonstrate antegrade flow. Subclavians: Normal flow hemodynamics were seen in bilateral subclavianarteries.  Assessment & Plan   1.  Chest wall pain/ anxiety-contacted nurse triage line 07/12/2020 and indicated that she has been having intermittent episodes of chest discomfort.  Her chest discomfort lasts seconds to a minute and dissipates on its own.  EKG today shows normal sinus rhythm 62 bpm Heart healthy low-sodium diet-salty 6 given Increase physical activity as tolerated Mindfulness stress reduction sheet given.  Essential hypertension-BP today 170/80.  Reports whitecoat syndrome.  Much better control at home.  140s over 80.  Working with [facility] due to renal artery stenosis.  Has appointment scheduled with them next week.  Also  working with Dr. Joylene Draft on blood pressure medication. Continue olmesartan, hydralazine Heart healthy low-sodium diet-salty 6 given Increase physical activity as tolerated  Hyperlipidemia-LDL 71 on 10/20 Continue atorvastatin, Zetia Heart healthy low-sodium high-fiber diet Increase physical activity as tolerated  Carotid artery disease-carotid ultrasound 07/14/2019 showed bilateral ICA stenosis 1-39% Follows with Dr. Doren Custard  Disposition: Follow-up with Dr. Martinique or in 6 months.  Jossie Ng. Bari Leib NP-C    07/12/2020, 4:35 PM [address] Office [phone] Fax [phone]  Notice: This dictation was prepared with Dragon dictation along with smaller phrase technology. Any transcriptional errors that result from this process are unintentional and may not be corrected upon review.

## 2020-07-12 NOTE — Telephone Encounter (Signed)
Pt c/o of Chest Pain: STAT if CP now or developed within 24 hours  1. Are you having CP right now? No, but says had chest pain this morning a little after she woke up.  2. Are you experiencing any other symptoms (ex. SOB, nausea, vomiting, sweating)? No.  3. How long have you been experiencing CP? Last 3 days.  4. Is your CP continuous or coming and going? Coming and going.  5. Have you taken Nitroglycerin? No.  Patient is calling in stating that she has been experiencing chest pains for the past three days and is concerned. Please advise. ?

## 2020-07-12 NOTE — Patient Instructions (Addendum)
Medication Instructions:  The current medical regimen is effective;  continue present plan and medications as directed. Please refer to the Current Medication list given to you today.  *If you need a refill on your cardiac medications before your next appointment, please call your pharmacy*  Lab Work:   Testing/Procedures:  NONE    NONE  Special Instructions PLEASE READ AND FOLLOW SALTY 6-ATTACHED-1,800mg  daily  PLEASE INCREASE PHYSICAL ACTIVITY AS TOLERATED  PLEASE READ AND FOLLOW STRESS REDUCTION TIPS-ATTACHED  Follow-Up: Your next appointment:  6 month(s) In Person with Peter Martinique, MD OR IF UNAVAILABLE Harvey, FNP-C    Please call our office 2 months in advance to schedule this appointment   At [facility], you and your health needs are our priority.  As part of our continuing mission to provide you with exceptional heart care, we have created designated Provider Care Teams.  These Care Teams include your primary Cardiologist (physician) and Advanced Practice Providers (APPs -  Physician Assistants and Nurse Practitioners) who all work together to provide you with the care you need, when you need it.            6 SALTY THINGS TO AVOID     1,800MG  DAILY     Mindfulness-Based Stress Reduction Mindfulness-based stress reduction (MBSR) is a program that helps people learn to practice mindfulness. Mindfulness is the practice of intentionally paying attention to the present moment. It can be learned and practiced through techniques such as education, breathing exercises, meditation, and yoga. MBSR includes several mindfulness techniques in one program. MBSR works best when you understand the treatment, are willing to try new things, and can commit to spending time practicing what you learn. MBSR training may include learning about:  How your emotions, thoughts, and reactions affect your body.  New ways to respond to things that cause negative thoughts to start  (triggers).  How to notice your thoughts and let go of them.  Practicing awareness of everyday things that you normally do without thinking.  The techniques and goals of different types of meditation. What are the benefits of MBSR? MBSR can have many benefits, which include helping you to:  Develop self-awareness. This refers to knowing and understanding yourself.  Learn skills and attitudes that help you to participate in your own health care.  Learn new ways to care for yourself.  Be more accepting about how things are, and let things go.  Be less judgmental and approach things with an open mind.  Be patient with yourself and trust yourself more. MBSR has also been shown to:  Reduce negative emotions, such as depression and anxiety.  Improve memory and focus.  Change how you sense and approach pain.  Boost your body's ability to fight infections.  Help you connect better with other people.  Improve your sense of well-being. Follow these instructions at home:   Find a local in-person or online MBSR program.  Set aside some time regularly for mindfulness practice.  Find a mindfulness practice that works best for you. This may include one or more of the following: ? Meditation. Meditation involves focusing your mind on a certain thought or activity. ? Breathing awareness exercises. These help you to stay present by focusing on your breath. ? Body scan. For this practice, you lie down and pay attention to each part of your body from head to toe. You can identify tension and soreness and intentionally relax parts of your body. ? Yoga. Yoga involves stretching and breathing,  and it can improve your ability to move and be flexible. It can also provide an experience of testing your body's limits, which can help you release stress. ? Mindful eating. This way of eating involves focusing on the taste, texture, color, and smell of each bite of food. Because this slows down eating  and helps you feel full sooner, it can be an important part of a weight-loss plan.  Find a podcast or recording that provides guidance for breathing awareness, body scan, or meditation exercises. You can listen to these any time when you have a free moment to rest without distractions.  Follow your treatment plan as told by your health care provider. This may include taking regular medicines and making changes to your diet or lifestyle as recommended. How to practice mindfulness To do a basic awareness exercise:  Find a comfortable place to sit.  Pay attention to the present moment. Observe your thoughts, feelings, and surroundings just as they are.  Avoid placing judgment on yourself, your feelings, or your surroundings. Make note of any judgment that comes up, and let it go.  Your mind may wander, and that is okay. Make note of when your thoughts drift, and return your attention to the present moment. To do basic mindfulness meditation:  Find a comfortable place to sit. This may include a stable chair or a firm floor cushion. ? Sit upright with your back straight. Let your arms fall next to your side with your hands resting on your legs. ? If sitting in a chair, rest your feet flat on the floor. ? If sitting on a cushion, cross your legs in front of you.  Keep your head in a neutral position with your chin dropped slightly. Relax your jaw and rest the tip of your tongue on the roof of your mouth. Drop your gaze to the floor. You can close your eyes if you like.  Breathe normally and pay attention to your breath. Feel the air moving in and out of your nose. Feel your belly expanding and relaxing with each breath.  Your mind may wander, and that is okay. Make note of when your thoughts drift, and return your attention to your breath.  Avoid placing judgment on yourself, your feelings, or your surroundings. Make note of any judgment or feelings that come up, let them go, and bring your  attention back to your breath.  When you are ready, lift your gaze or open your eyes. Pay attention to how your body feels after the meditation. Where to find more information You can find more information about MBSR from:  Your health care provider.  Community-based meditation centers or programs.  Programs offered near you. Summary  Mindfulness-based stress reduction (MBSR) is a program that teaches you how to intentionally pay attention to the present moment. It is used with other treatments to help you cope better with daily stress, emotions, and pain.  MBSR focuses on developing self-awareness, which allows you to respond to life stress without judgment or negative emotions.  MBSR programs may involve learning different mindfulness practices, such as breathing exercises, meditation, yoga, body scan, or mindful eating. Find a mindfulness practice that works best for you, and set aside time for it on a regular basis. This information is not intended to replace advice given to you by your health care provider. Make sure you discuss any questions you have with your health care provider. Document Revised: 06/06/2017 Document Reviewed: 10/31/2016 Elsevier Patient Education  2020 Elsevier  Inc.  

## 2020-07-12 NOTE — Telephone Encounter (Signed)
Returned call to patient of Dr. Swaz[place] who has PMH of non-obstructive CAD on Coronary CTA, HTN, HLD, Carotid artery disease s/p L CEA 2019, CVA, ischemic optic neuropathy  She reports 3 days of off and on left-sided chest pain, towards her arm but not radiating She said the pain is achy/dull - not heavy, no pressure, not sharp/shooting She denies SOB, nausea, and vomiting She is unsure if this is stress-related, or MSK  Explained that with symptoms of active chest pain we advise an ED evaluation but she would prefer Dr. Swaziland review her concerns and advise  Message routed to MD

## 2020-07-12 NOTE — Telephone Encounter (Signed)
Spoke to patient she stated she has been having chest pain off and on for the past 3 days.No chest pain at present.Appointment scheduled with Edd Fabian NP today at 3:45 pm.

## 2020-07-19 ENCOUNTER — Other Ambulatory Visit: Payer: Self-pay

## 2020-07-19 ENCOUNTER — Ambulatory Visit (HOSPITAL_COMMUNITY)
Admission: RE | Admit: 2020-07-19 | Discharge: 2020-07-19 | Disposition: A | Discharge status: Home / Self Care | Payer: Medicare Other | Source: Ambulatory Visit | Attending: Vascular Surgery | Admitting: Vascular Surgery

## 2020-07-19 ENCOUNTER — Ambulatory Visit: Payer: Medicare Other | Admitting: Vascular Surgery

## 2020-07-19 ENCOUNTER — Encounter: Payer: Self-pay | Admitting: Vascular Surgery

## 2020-07-19 VITALS — BP 162/81 | HR 61 | Temp 97.4°F | Resp 20 | Ht 61.0 in | Wt 114.0 lb

## 2020-07-19 DIAGNOSIS — I6522 Occlusion and stenosis of left carotid artery: Secondary | ICD-10-CM

## 2020-07-19 DIAGNOSIS — I771 Stricture of artery: Secondary | ICD-10-CM

## 2020-07-19 DIAGNOSIS — I774 Celiac artery compression syndrome: Secondary | ICD-10-CM | POA: Diagnosis not present

## 2020-07-19 DIAGNOSIS — K551 Chronic vascular disorders of intestine: Secondary | ICD-10-CM | POA: Diagnosis not present

## 2020-07-19 DIAGNOSIS — I6523 Occlusion and stenosis of bilateral carotid arteries: Secondary | ICD-10-CM | POA: Insufficient documentation

## 2020-07-19 NOTE — Progress Notes (Signed)
REASON FOR CONSULT:    Mesenteric artery occlusive disease.  The consult is requested by Dr. 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  Based on the velocities suspect the stenoses are in the 70% range.  She is asymptomatic.  She denies any postprandial abdominal pain or weight loss.  Thus no further work-up is indicated for this.  If she develops significant symptoms would be nice to get a CT angiogram however she has stage III-IV chronic kidney disease so we would like to avoid this if at all possible.  STATUS POST LEFT CAROTID ENDARTERECTOMY: Patient underwent left carotid endarterectomy in 2019.  There is no evidence of recurrent carotid stenosis on the left.  She has no stenosis on the right.  She is on aspirin and is on a statin.  We have discussed the importance of exercise and nutrition.  I have ordered a follow-up carotid duplex scan in 1 year and I will see her back at that time.  She knows to call sooner if she has problems.   [name], MD Office: [phone]   HPI:   Shannon Berger is a pleasant 80 y.o. female, who I last saw on 07/14/2019.  She is undergone previous left carotid endarterectomy in November 2019.  She comes in for yearly follow-up visit.  Of note she was also noted to have a stenosis of her celiac axis and super mesenteric artery and renal artery duplex for this reason vascular surgery was consulted for further recommendations.  On my history the patient denies any abdominal pain or postprandial abdominal pain.  She has had no weight loss.  In fact, she states that she has gained some weight.  She underwent a left carotid endarterectomy in 2019.  She has had no history of stroke, TIAs, expressive or receptive aphasia, or amaurosis fugax.  She is on aspirin and is on a statin.   Past Medical History:  Diagnosis Date  .  Anginal pain ([place])    occasionally  . Anxiety   . Arthritis   . Carotid arterial disease ([place])   . Chronic kidney disease   . Complication of anesthesia    "hallucinates for days; have a hard time waking up; almost couldn't get her awake last time; wake up wild" (05/26/2018)  . Constipation   . Degeneration of lumbar intervertebral disc   . Dislocation of hip joint prosthesis ([place])   . Essential tremor   . Fusion of spine of lumbar region   . Heart murmur    "slight one" (05/26/2018)  . Hyperlipidemia   . Hypertension   . Low back pain radiating to left lower extremity   . Melanoma ([place])    "center of upper chest"  . Osteoarthritis   . Osteopenia   . Sleep difficulties   . Spinal stenosis at L4-L5 level   . Stroke ([place])    tia--last week  . TIA (transient ischemic attack) 05/20/2018    Family History  Problem Relation Age of Onset  . Heart failure Mother   . Cancer Mother        colon  . Hypertension Mother   . Other Mother        Varicose Veins  . Diabetes Father   . Heart disease Father   . Hypertension Father   . Heart attack Father   . Cancer Brother  squamos cell - head, neck, ear  . Healthy Son   . Migraines Daughter     SOCIAL HISTORY: Social History   Socioeconomic History  . Marital status: Married    Spouse name: jerry  . Number of children: 2  . Years of education: Not on file  . Highest education level: Bachelor's degree (e.g., BA, AB, BS)  Occupational History  . Occupation: Artist: RETIRED    Comment: retired  Tobacco Use  . Smoking status: Never Smoker  . Smokeless tobacco: Never Used  Vaping Use  . Vaping Use: Never used  Substance and Sexual Activity  . Alcohol use: Yes    Alcohol/week: 1.0 standard drink    Types: 1 Glasses of wine per week    Comment: Occas.  . Drug use: Never  . Sexual activity: Not Currently    Partners: Male    Comment: 1st intercourse- 44, partners- 42, married- 51 yrs   Other  Topics Concern  . Not on file  Social History Narrative   Pt lives at home with spouse jerry, 2 story home has 2 children   She is right handed, she drinks ginger ale and come tea, no coffee, or soda   Social Determinants of Health   Financial Resource Strain: Not on file  Food Insecurity: Not on file  Transportation Needs: Not on file  Physical Activity: Not on file  Stress: Not on file  Social Connections: Not on file  Intimate Partner Violence: Not on file    Allergies  Allergen Reactions  . Oxycodone-Acetaminophen     hallucinations  . Versed [Midazolam] Other (See Comments)    Had trouble waking up   . Hydrocodone-Acetaminophen     hallucination     Current Outpatient Medications  Medication Sig Dispense Refill  . Abaloparatide (TYMLOS [place]) Inject 80 mcg into the skin daily.     Marland Kitchen acetaminophen (TYLENOL) 500 MG tablet Take 500 mg by mouth every 6 (six) hours as needed for moderate pain or headache.    . ALPRAZolam (XANAX) 0.5 MG tablet Take 0.25 mg by mouth at bedtime as needed for sleep.     . Ascorbic Acid (VITAMIN C) 1000 MG tablet Take 1,000 mg by mouth 2 (two) times daily.    Marland Kitchen aspirin EC 325 MG EC tablet Take 1 tablet (325 mg total) by mouth daily. 30 tablet 0  . atorvastatin (LIPITOR) 40 MG tablet Take 40 mg by mouth daily.  3  . bisoprolol (ZEBETA) 10 MG tablet Take 10 mg by mouth daily.    . Calcium Carbonate-Vit D-Min (CALCIUM 1200 PO) Take 1 tablet by mouth daily.    . Cholecalciferol (VITAMIN D) 2000 units tablet Take 2,000 Units by mouth daily.     Marland Kitchen desvenlafaxine (PRISTIQ) 100 MG 24 hr tablet Take 100 mg by mouth daily.    Marland Kitchen ezetimibe (ZETIA) 10 MG tablet Take 10 mg by mouth daily.    . furosemide (LASIX) 20 MG tablet Take 20 mg by mouth every Monday, Wednesday, and Friday.    . hydrALAZINE (APRESOLINE) 25 MG tablet Take 25 mg by mouth 2 (two) times daily.    Marland Kitchen lamoTRIgine (LAMICTAL) 25 MG tablet Take 25-75 mg by mouth See admin instructions. Take 25 mg at  night for 2 weeks, then 50 mg at night for 2 weeks, then 75 mg at night    . Multiple Vitamin (MULTIVITAMIN) tablet Take 1 tablet by mouth daily.    Marland Kitchen  olmesartan (BENICAR) 40 MG tablet Take 40 mg by mouth daily.    . Omega-3 Fatty Acids (FISH OIL PO) Take 1,400 mg by mouth 2 (two) times daily.    . potassium chloride SA (KLOR-CON) 20 MEQ tablet Take 20 mEq by mouth every Monday, Wednesday, and Friday.    Marland Kitchen REPATHA SURECLICK 924 MG/ML SOAJ Inject 1 Syringe into the skin every 14 (fourteen) days.    Marland Kitchen zinc gluconate 50 MG tablet Take 50 mg by mouth daily.    Marland Kitchen oxybutynin (DITROPAN) 5 MG tablet Take 1 tablet (5 mg total) by mouth every 8 (eight) hours as needed for bladder spasms. (Patient not taking: No sig reported) 30 tablet 1  . phenazopyridine (PYRIDIUM) 200 MG tablet Take 1 tablet (200 mg total) by mouth 3 (three) times daily as needed (for pain with urination). (Patient not taking: No sig reported) 30 tablet 0   No current facility-administered medications for this visit.   Facility-Administered Medications Ordered in Other Visits  Medication Dose Route Frequency Provider Last Rate Last Admin  . gemcitabine (GEMZAR) chemo syringe for bladder instillation 2,000 mg  2,000 mg Bladder Instillation Once Winter, Conception Oms, MD        REVIEW OF SYSTEMS:  [X]  denotes positive finding, [ ]  denotes negative finding Cardiac  Comments:  Chest pain or chest pressure:    Shortness of breath upon exertion:    Short of breath when lying flat:    Irregular heart rhythm:        Vascular    Pain in calf, thigh, or hip brought on by ambulation:    Pain in feet at night that wakes you up from your sleep:     Blood clot in your veins:    Leg swelling:         Pulmonary    Oxygen at home:    Productive cough:     Wheezing:         Neurologic    Sudden weakness in arms or legs:     Sudden numbness in arms or legs:     Sudden onset of difficulty speaking or slurred speech:    Temporary loss  of vision in one eye:     Problems with dizziness:         Gastrointestinal    Blood in stool:     Vomited blood:         Genitourinary    Burning when urinating:     Blood in urine:        Psychiatric    Major depression:         Hematologic    Bleeding problems:    Problems with blood clotting too easily:        Skin    Rashes or ulcers:        Constitutional    Fever or chills:     PHYSICAL EXAM:   Vitals:   07/19/20 0839  BP: (!) 162/81  Pulse: 61  Resp: 20  Temp: (!) 97.4 F (36.3 C)  SpO2: 98%  Weight: 114 lb (51.7 kg)  Height: 5\' 1"  (1.549 m)    GENERAL: The patient is a well-nourished female, in no acute distress. The vital signs are documented above. CARDIAC: There is a regular rate and rhythm.  VASCULAR: I do not detect carotid bruits. She has palpable posterior tibial pulses bilaterally. PULMONARY: There is good air exchange bilaterally without wheezing or rales. ABDOMEN: Soft and non-tender with normal pitched  bowel sounds.  I do not detect abdominal bruits MUSCULOSKELETAL: There are no major deformities or cyanosis. NEUROLOGIC: No focal weakness or paresthesias are detected. SKIN: There are no ulcers or rashes noted. PSYCHIATRIC: The patient has a normal affect.  DATA:    CAROTID DUPLEX: I have independently interpreted her carotid duplex scan today.  On the right side there is a less than 39% stenosis.  The right vertebral artery is patent with antegrade flow.  On the left side there is a less than 39% stenosis.  The left vertebral artery is patent with antegrade flow.  RENAL ARTERY DUPLEX: I have reviewed the renal artery duplex scan that was done on 05/19/2020.  There was no significant renal artery stenosis bilaterally.  However she was noted to have elevated velocities in the celiac axis and superior mesenteric artery suggesting a greater than 70% stenosis in each of these arteries.  CELIAC AXIS: Peak systolic velocity A999333 cm/s with an  end-diastolic velocity of 37 cm/s.  SUPERIOR MESENTERIC ARTERY: Proximal SMA had a peak systolic velocity of 99991111 cm/s with an end-diastolic velocity of 59 cm/s.  The mid SMA had a peak systolic velocity of 99991111 cm/s with end-diastolic velocity of 63 cm/s.  LABS: I reviewed her labs from 06/06/2020.  GFR was 28.  Creatinine 1.8.

## 2020-07-21 DIAGNOSIS — Z20828 Contact with and (suspected) exposure to other viral communicable diseases: Secondary | ICD-10-CM | POA: Diagnosis not present

## 2020-07-21 DIAGNOSIS — N1831 Chronic kidney disease, stage 3a: Secondary | ICD-10-CM | POA: Diagnosis not present

## 2020-07-21 DIAGNOSIS — J029 Acute pharyngitis, unspecified: Secondary | ICD-10-CM | POA: Diagnosis not present

## 2020-07-21 DIAGNOSIS — I129 Hypertensive chronic kidney disease with stage 1 through stage 4 chronic kidney disease, or unspecified chronic kidney disease: Secondary | ICD-10-CM | POA: Diagnosis not present

## 2020-07-21 DIAGNOSIS — B349 Viral infection, unspecified: Secondary | ICD-10-CM | POA: Diagnosis not present

## 2020-08-02 DIAGNOSIS — N1831 Chronic kidney disease, stage 3a: Secondary | ICD-10-CM | POA: Diagnosis not present

## 2020-08-02 DIAGNOSIS — U071 COVID-19: Secondary | ICD-10-CM | POA: Diagnosis not present

## 2020-08-02 DIAGNOSIS — I129 Hypertensive chronic kidney disease with stage 1 through stage 4 chronic kidney disease, or unspecified chronic kidney disease: Secondary | ICD-10-CM | POA: Diagnosis not present

## 2020-08-18 DIAGNOSIS — M1811 Unilateral primary osteoarthritis of first carpometacarpal joint, right hand: Secondary | ICD-10-CM | POA: Diagnosis not present

## 2020-09-04 DIAGNOSIS — M19031 Primary osteoarthritis, right wrist: Secondary | ICD-10-CM | POA: Insufficient documentation

## 2020-09-04 DIAGNOSIS — M79644 Pain in right finger(s): Secondary | ICD-10-CM | POA: Diagnosis not present

## 2020-09-04 DIAGNOSIS — M79641 Pain in right hand: Secondary | ICD-10-CM | POA: Diagnosis not present

## 2020-09-07 DIAGNOSIS — H35371 Puckering of macula, right eye: Secondary | ICD-10-CM | POA: Diagnosis not present

## 2020-09-07 DIAGNOSIS — H47011 Ischemic optic neuropathy, right eye: Secondary | ICD-10-CM | POA: Diagnosis not present

## 2020-09-07 DIAGNOSIS — Z961 Presence of intraocular lens: Secondary | ICD-10-CM | POA: Diagnosis not present

## 2020-09-13 ENCOUNTER — Telehealth: Payer: Self-pay | Admitting: Neurology

## 2020-09-13 NOTE — Telephone Encounter (Signed)
Pt called, have heard from anyone about my CPAP machine. Would like a call from the nurse.  I Deneise Lever) gave Pt contact info for [facility].

## 2020-09-13 NOTE — Telephone Encounter (Signed)
Noted, thank you

## 2020-09-21 DIAGNOSIS — Z8551 Personal history of malignant neoplasm of bladder: Secondary | ICD-10-CM | POA: Diagnosis not present

## 2020-09-21 DIAGNOSIS — N3281 Overactive bladder: Secondary | ICD-10-CM | POA: Diagnosis not present

## 2020-09-22 DIAGNOSIS — H02054 Trichiasis without entropian left upper eyelid: Secondary | ICD-10-CM | POA: Insufficient documentation

## 2020-09-22 DIAGNOSIS — H47011 Ischemic optic neuropathy, right eye: Secondary | ICD-10-CM | POA: Diagnosis not present

## 2020-09-22 DIAGNOSIS — H5212 Myopia, left eye: Secondary | ICD-10-CM | POA: Insufficient documentation

## 2020-09-22 DIAGNOSIS — H524 Presbyopia: Secondary | ICD-10-CM | POA: Diagnosis not present

## 2020-09-27 DIAGNOSIS — E785 Hyperlipidemia, unspecified: Secondary | ICD-10-CM | POA: Diagnosis not present

## 2020-09-27 DIAGNOSIS — I129 Hypertensive chronic kidney disease with stage 1 through stage 4 chronic kidney disease, or unspecified chronic kidney disease: Secondary | ICD-10-CM | POA: Diagnosis not present

## 2020-09-27 DIAGNOSIS — D649 Anemia, unspecified: Secondary | ICD-10-CM | POA: Diagnosis not present

## 2020-09-27 DIAGNOSIS — N1831 Chronic kidney disease, stage 3a: Secondary | ICD-10-CM | POA: Diagnosis not present

## 2020-10-09 ENCOUNTER — Other Ambulatory Visit (HOSPITAL_COMMUNITY): Payer: Self-pay

## 2020-10-10 ENCOUNTER — Other Ambulatory Visit: Payer: Self-pay

## 2020-10-10 ENCOUNTER — Ambulatory Visit (HOSPITAL_COMMUNITY)
Admission: RE | Admit: 2020-10-10 | Discharge: 2020-10-10 | Disposition: A | Discharge status: Home / Self Care | Payer: Medicare Other | Source: Ambulatory Visit | Attending: Internal Medicine | Admitting: Internal Medicine

## 2020-10-10 DIAGNOSIS — M810 Age-related osteoporosis without current pathological fracture: Secondary | ICD-10-CM | POA: Insufficient documentation

## 2020-10-10 MED ORDER — DENOSUMAB 60 MG/ML ~~LOC~~ SOSY
PREFILLED_SYRINGE | SUBCUTANEOUS | Status: AC
  Administered 2020-10-10: 60 mg via SUBCUTANEOUS
  Filled 2020-10-10: qty 1

## 2020-10-10 MED ORDER — DENOSUMAB 60 MG/ML ~~LOC~~ SOSY: 60.0000 mg | PREFILLED_SYRINGE | Freq: Once | SUBCUTANEOUS | Status: AC

## 2020-10-10 NOTE — Discharge Instructions (Signed)
Denosumab injection What is this medicine? DENOSUMAB (den oh sue mab) slows bone breakdown. Prolia is used to treat osteoporosis in women after menopause and in men, and in people who are taking corticosteroids for 6 months or more. Xgeva is used to treat a high calcium level due to cancer and to prevent bone fractures and other bone problems caused by multiple myeloma or cancer bone metastases. Xgeva is also used to treat giant cell tumor of the bone. This medicine may be used for other purposes; ask your health care provider or pharmacist if you have questions. COMMON BRAND NAME(S): Prolia, XGEVA What should I tell my health care provider before I take this medicine? They need to know if you have any of these conditions:  dental disease  having surgery or tooth extraction  infection  kidney disease  low levels of calcium or Vitamin D in the blood  malnutrition  on hemodialysis  skin conditions or sensitivity  thyroid or parathyroid disease  an unusual reaction to denosumab, other medicines, foods, dyes, or preservatives  pregnant or trying to get pregnant  breast-feeding How should I use this medicine? This medicine is for injection under the skin. It is given by a health care professional in a hospital or clinic setting. A special MedGuide will be given to you before each treatment. Be sure to read this information carefully each time. For Prolia, talk to your pediatrician regarding the use of this medicine in children. Special care may be needed. For Xgeva, talk to your pediatrician regarding the use of this medicine in children. While this drug may be prescribed for children as young as 13 years for selected conditions, precautions do apply. Overdosage: If you think you have taken too much of this medicine contact a poison control center or emergency room at once. NOTE: This medicine is only for you. Do not share this medicine with others. What if I miss a dose? It is  important not to miss your dose. Call your doctor or health care professional if you are unable to keep an appointment. What may interact with this medicine? Do not take this medicine with any of the following medications:  other medicines containing denosumab This medicine may also interact with the following medications:  medicines that lower your chance of fighting infection  steroid medicines like prednisone or cortisone This list may not describe all possible interactions. Give your health care provider a list of all the medicines, herbs, non-prescription drugs, or dietary supplements you use. Also tell them if you smoke, drink alcohol, or use illegal drugs. Some items may interact with your medicine. What should I watch for while using this medicine? Visit your doctor or health care professional for regular checks on your progress. Your doctor or health care professional may order blood tests and other tests to see how you are doing. Call your doctor or health care professional for advice if you get a fever, chills or sore throat, or other symptoms of a cold or flu. Do not treat yourself. This drug may decrease your body's ability to fight infection. Try to avoid being around people who are sick. You should make sure you get enough calcium and vitamin D while you are taking this medicine, unless your doctor tells you not to. Discuss the foods you eat and the vitamins you take with your health care professional. See your dentist regularly. Brush and floss your teeth as directed. Before you have any dental work done, tell your dentist you are   receiving this medicine. Do not become pregnant while taking this medicine or for 5 months after stopping it. Talk with your doctor or health care professional about your birth control options while taking this medicine. Women should inform their doctor if they wish to become pregnant or think they might be pregnant. There is a potential for serious side  effects to an unborn child. Talk to your health care professional or pharmacist for more information. What side effects may I notice from receiving this medicine? Side effects that you should report to your doctor or health care professional as soon as possible:  allergic reactions like skin rash, itching or hives, swelling of the face, lips, or tongue  bone pain  breathing problems  dizziness  jaw pain, especially after dental work  redness, blistering, peeling of the skin  signs and symptoms of infection like fever or chills; cough; sore throat; pain or trouble passing urine  signs of low calcium like fast heartbeat, muscle cramps or muscle pain; pain, tingling, numbness in the hands or feet; seizures  unusual bleeding or bruising  unusually weak or tired Side effects that usually do not require medical attention (report to your doctor or health care professional if they continue or are bothersome):  constipation  diarrhea  headache  joint pain  loss of appetite  muscle pain  runny nose  tiredness  upset stomach This list may not describe all possible side effects. Call your doctor for medical advice about side effects. You may report side effects to FDA at 1-800-FDA-1088. Where should I keep my medicine? This medicine is only given in a clinic, doctor's office, or other health care setting and will not be stored at home. NOTE: This sheet is a summary. It may not cover all possible information. If you have questions about this medicine, talk to your doctor, pharmacist, or health care provider.  2021 Elsevier/Gold Standard (2017-10-31 16:10:44)

## 2020-10-25 DIAGNOSIS — F3341 Major depressive disorder, recurrent, in partial remission: Secondary | ICD-10-CM | POA: Diagnosis not present

## 2020-10-25 DIAGNOSIS — F411 Generalized anxiety disorder: Secondary | ICD-10-CM | POA: Diagnosis not present

## 2020-11-01 DIAGNOSIS — M5416 Radiculopathy, lumbar region: Secondary | ICD-10-CM | POA: Diagnosis not present

## 2020-11-15 ENCOUNTER — Encounter: Payer: Self-pay | Admitting: Obstetrics & Gynecology

## 2020-11-15 ENCOUNTER — Other Ambulatory Visit: Payer: Self-pay

## 2020-11-15 ENCOUNTER — Ambulatory Visit: Payer: Medicare Other | Admitting: Obstetrics & Gynecology

## 2020-11-15 VITALS — BP 134/82

## 2020-11-15 DIAGNOSIS — R140 Abdominal distension (gaseous): Secondary | ICD-10-CM | POA: Diagnosis not present

## 2020-11-15 NOTE — Progress Notes (Signed)
    Shannon Berger [birth year] [id number]        80 y.o.  G2P2L2  RP:  Bloated, hard lower abdomen x 3 weeks  HPI: Early Dx of Bladder Ca about 4 months ago.  Excision done, no adjuvant treatment needed.  C/O having a larger harder lower abdomen x 3 weeks.  No pelvic pain.  No PMB.  Started back with fitness exercises about 1 month ago.   OB History  Gravida Para Term Preterm AB Living  2 2       2   SAB IAB Ectopic Multiple Live Births               # Outcome Date GA Lbr Len/2nd Weight Sex Delivery Anes PTL Lv  2 Para           1 Para             Past medical history,surgical history, problem list, medications, allergies, family history and social history were all reviewed and documented in the EPIC chart.   Directed ROS with pertinent positives and negatives documented in the history of present illness/assessment and plan.  Exam:  Vitals:   11/15/20 1611  BP: 134/82   General appearance:  Normal  Abdomen:  Soft with no distention, no mass, NT in DD.  Very good Recti Muscles when lifting her head.  Gynecologic exam: Vulva normal.  Bimanual exam:  Uterus AV, normal volume, mobile, NT.  No adnexal mass felt, NT bilaterally.   Assessment/Plan:  80 y.o. G2P2   1. Abdominal bloating Normal gynecologic exam.  R/O Ovarian pathology with a Pelvic US at f/u. - US Transvaginal Non-OB; Future  Princess Bruins MD, 4:37 PM 11/15/2020

## 2020-11-17 ENCOUNTER — Encounter: Payer: Self-pay | Admitting: Obstetrics & Gynecology

## 2020-11-21 ENCOUNTER — Other Ambulatory Visit: Payer: Self-pay

## 2020-11-21 ENCOUNTER — Encounter: Payer: Self-pay | Admitting: Obstetrics & Gynecology

## 2020-11-21 ENCOUNTER — Ambulatory Visit (INDEPENDENT_AMBULATORY_CARE_PROVIDER_SITE_OTHER): Payer: Medicare Other | Admitting: Obstetrics & Gynecology

## 2020-11-21 ENCOUNTER — Ambulatory Visit (INDEPENDENT_AMBULATORY_CARE_PROVIDER_SITE_OTHER): Payer: Medicare Other

## 2020-11-21 VITALS — BP 150/68

## 2020-11-21 DIAGNOSIS — R140 Abdominal distension (gaseous): Secondary | ICD-10-CM | POA: Diagnosis not present

## 2020-11-21 DIAGNOSIS — L820 Inflamed seborrheic keratosis: Secondary | ICD-10-CM | POA: Diagnosis not present

## 2020-11-21 NOTE — Progress Notes (Signed)
    Shannon Berger [birth year] [id number]        79 y.o.  G2P2L2  RP: Lower abdominal bloating x 4 weeks for Pelvic US  HPI: No change x last visit on 11/15/2020 when we noted:  Early Dx of Bladder Ca about 4 months ago.  Excision done, no adjuvant treatment needed.  C/O having a larger harder lower abdomen x 3 weeks. No pelvic pain. No PMB.  Started back with fitness exercises about 1 month ago.  BMs normal per patient.   OB History  Gravida Para Term Preterm AB Living  2 2       2   SAB IAB Ectopic Multiple Live Births               # Outcome Date GA Lbr Len/2nd Weight Sex Delivery Anes PTL Lv  2 Para           1 Para             Past medical history,surgical history, problem list, medications, allergies, family history and social history were all reviewed and documented in the EPIC chart.   Directed ROS with pertinent positives and negatives documented in the history of present illness/assessment and plan.  Exam:  Vitals:   11/21/20 1316  BP: (!) 150/68   General appearance:  Normal  Pelvic US today: T/V images.  Anteverted uterus normal size and shape with no myometrial mass.  The uterus is measured at 4.54 x 3.83 x 2.78 cm.  Thin symmetrical endometrial lining with free fluid in the endometrial canal, no mass seen.  The endometrial lining is measured at 2.2 mm.  Bilateral atrophic ovaries with no mass seen.  No adnexal mass.  No free fluid in the cul-de-sac.  Dilated loops of bowels noted in the left adnexa.   Assessment/Plan:  80 y.o. G2P2   1. Abdominal bloating Pelvic ultrasound findings reviewed thoroughly with patient.  Patient reassured that her uterus, endometrial lining and bilateral ovaries are normal.  Given the lower hard abdomen and the dilated bowel loops on ultrasound, decision to refer to Gastro, Dr Ronald Lobo, with Shannon Berger.    Princess Bruins MD, 1:57 PM 11/21/2020

## 2020-11-24 ENCOUNTER — Telehealth: Payer: Self-pay | Admitting: *Deleted

## 2020-11-24 DIAGNOSIS — N390 Urinary tract infection, site not specified: Secondary | ICD-10-CM | POA: Diagnosis not present

## 2020-11-24 DIAGNOSIS — M545 Low back pain, unspecified: Secondary | ICD-10-CM | POA: Diagnosis not present

## 2020-11-24 DIAGNOSIS — I10 Essential (primary) hypertension: Secondary | ICD-10-CM | POA: Diagnosis not present

## 2020-11-24 DIAGNOSIS — I129 Hypertensive chronic kidney disease with stage 1 through stage 4 chronic kidney disease, or unspecified chronic kidney disease: Secondary | ICD-10-CM | POA: Diagnosis not present

## 2020-11-24 DIAGNOSIS — N1831 Chronic kidney disease, stage 3a: Secondary | ICD-10-CM | POA: Diagnosis not present

## 2020-11-24 NOTE — Telephone Encounter (Addendum)
Patient called because Shannon Berger never received fax from office visit on 11/21/20. Office notes faxed [facility] attention [phone]. Patient aware this has been done.

## 2020-11-28 ENCOUNTER — Other Ambulatory Visit: Payer: Self-pay | Admitting: Gastroenterology

## 2020-11-28 DIAGNOSIS — R9389 Abnormal findings on diagnostic imaging of other specified body structures: Secondary | ICD-10-CM | POA: Diagnosis not present

## 2020-11-28 DIAGNOSIS — K219 Gastro-esophageal reflux disease without esophagitis: Secondary | ICD-10-CM | POA: Diagnosis not present

## 2020-11-28 DIAGNOSIS — R140 Abdominal distension (gaseous): Secondary | ICD-10-CM

## 2020-11-28 DIAGNOSIS — N184 Chronic kidney disease, stage 4 (severe): Secondary | ICD-10-CM | POA: Diagnosis not present

## 2020-12-07 ENCOUNTER — Ambulatory Visit
Admission: RE | Admit: 2020-12-07 | Discharge: 2020-12-07 | Disposition: A | Discharge status: Home / Self Care | Payer: Medicare Other | Source: Ambulatory Visit | Attending: Gastroenterology | Admitting: Gastroenterology

## 2020-12-07 DIAGNOSIS — K769 Liver disease, unspecified: Secondary | ICD-10-CM | POA: Diagnosis not present

## 2020-12-07 DIAGNOSIS — R140 Abdominal distension (gaseous): Secondary | ICD-10-CM

## 2020-12-07 DIAGNOSIS — K3189 Other diseases of stomach and duodenum: Secondary | ICD-10-CM | POA: Diagnosis not present

## 2020-12-07 DIAGNOSIS — N200 Calculus of kidney: Secondary | ICD-10-CM | POA: Diagnosis not present

## 2020-12-07 DIAGNOSIS — M5134 Other intervertebral disc degeneration, thoracic region: Secondary | ICD-10-CM | POA: Diagnosis not present

## 2020-12-08 ENCOUNTER — Other Ambulatory Visit: Payer: Self-pay | Admitting: Gastroenterology

## 2020-12-08 ENCOUNTER — Other Ambulatory Visit (HOSPITAL_COMMUNITY): Payer: Self-pay | Admitting: Gastroenterology

## 2020-12-08 DIAGNOSIS — R140 Abdominal distension (gaseous): Secondary | ICD-10-CM

## 2020-12-08 DIAGNOSIS — R6881 Early satiety: Secondary | ICD-10-CM

## 2020-12-14 ENCOUNTER — Other Ambulatory Visit: Payer: Medicare Other

## 2020-12-19 ENCOUNTER — Other Ambulatory Visit: Payer: Self-pay

## 2020-12-19 ENCOUNTER — Encounter (HOSPITAL_COMMUNITY)
Admission: RE | Admit: 2020-12-19 | Discharge: 2020-12-19 | Disposition: A | Discharge status: Home / Self Care | Payer: Medicare Other | Source: Ambulatory Visit | Attending: Gastroenterology | Admitting: Gastroenterology

## 2020-12-19 DIAGNOSIS — R6881 Early satiety: Secondary | ICD-10-CM | POA: Insufficient documentation

## 2020-12-19 DIAGNOSIS — R140 Abdominal distension (gaseous): Secondary | ICD-10-CM | POA: Diagnosis not present

## 2020-12-19 DIAGNOSIS — Z8551 Personal history of malignant neoplasm of bladder: Secondary | ICD-10-CM | POA: Diagnosis not present

## 2020-12-19 MED ORDER — TECHNETIUM TC 99M SULFUR COLLOID: 1.9000 | Freq: Once | INTRAVENOUS | Status: DC | PRN

## 2020-12-20 DIAGNOSIS — Z981 Arthrodesis status: Secondary | ICD-10-CM | POA: Diagnosis not present

## 2020-12-20 DIAGNOSIS — M40204 Unspecified kyphosis, thoracic region: Secondary | ICD-10-CM | POA: Diagnosis not present

## 2020-12-20 DIAGNOSIS — M545 Low back pain, unspecified: Secondary | ICD-10-CM | POA: Diagnosis not present

## 2020-12-20 DIAGNOSIS — M5134 Other intervertebral disc degeneration, thoracic region: Secondary | ICD-10-CM | POA: Diagnosis not present

## 2020-12-22 DIAGNOSIS — M25551 Pain in right hip: Secondary | ICD-10-CM | POA: Diagnosis not present

## 2020-12-26 DIAGNOSIS — R140 Abdominal distension (gaseous): Secondary | ICD-10-CM | POA: Diagnosis not present

## 2020-12-28 DIAGNOSIS — N3281 Overactive bladder: Secondary | ICD-10-CM | POA: Diagnosis not present

## 2020-12-28 DIAGNOSIS — Z8551 Personal history of malignant neoplasm of bladder: Secondary | ICD-10-CM | POA: Diagnosis not present

## 2021-01-02 DIAGNOSIS — M5124 Other intervertebral disc displacement, thoracic region: Secondary | ICD-10-CM | POA: Diagnosis not present

## 2021-01-02 DIAGNOSIS — M4804 Spinal stenosis, thoracic region: Secondary | ICD-10-CM | POA: Diagnosis not present

## 2021-01-02 DIAGNOSIS — M549 Dorsalgia, unspecified: Secondary | ICD-10-CM | POA: Diagnosis not present

## 2021-01-10 DIAGNOSIS — M40204 Unspecified kyphosis, thoracic region: Secondary | ICD-10-CM | POA: Diagnosis not present

## 2021-01-10 DIAGNOSIS — M4120 Other idiopathic scoliosis, site unspecified: Secondary | ICD-10-CM | POA: Diagnosis not present

## 2021-01-10 DIAGNOSIS — M545 Low back pain, unspecified: Secondary | ICD-10-CM | POA: Diagnosis not present

## 2021-01-10 DIAGNOSIS — I10 Essential (primary) hypertension: Secondary | ICD-10-CM | POA: Diagnosis not present

## 2021-01-16 ENCOUNTER — Encounter: Payer: Self-pay | Admitting: Obstetrics & Gynecology

## 2021-01-16 DIAGNOSIS — R922 Inconclusive mammogram: Secondary | ICD-10-CM | POA: Diagnosis not present

## 2021-01-16 DIAGNOSIS — R921 Mammographic calcification found on diagnostic imaging of breast: Secondary | ICD-10-CM | POA: Diagnosis not present

## 2021-01-18 ENCOUNTER — Other Ambulatory Visit (HOSPITAL_COMMUNITY): Payer: Self-pay | Admitting: Orthopedic Surgery

## 2021-01-18 DIAGNOSIS — Z96642 Presence of left artificial hip joint: Secondary | ICD-10-CM

## 2021-01-18 DIAGNOSIS — M25552 Pain in left hip: Secondary | ICD-10-CM | POA: Diagnosis not present

## 2021-01-19 DIAGNOSIS — Z96642 Presence of left artificial hip joint: Secondary | ICD-10-CM | POA: Diagnosis not present

## 2021-01-23 DIAGNOSIS — M545 Low back pain, unspecified: Secondary | ICD-10-CM | POA: Diagnosis not present

## 2021-01-23 DIAGNOSIS — M40204 Unspecified kyphosis, thoracic region: Secondary | ICD-10-CM | POA: Diagnosis not present

## 2021-01-24 DIAGNOSIS — M6281 Muscle weakness (generalized): Secondary | ICD-10-CM | POA: Diagnosis not present

## 2021-01-24 DIAGNOSIS — M5126 Other intervertebral disc displacement, lumbar region: Secondary | ICD-10-CM | POA: Diagnosis not present

## 2021-01-24 DIAGNOSIS — M545 Low back pain, unspecified: Secondary | ICD-10-CM | POA: Diagnosis not present

## 2021-01-24 DIAGNOSIS — M4150 Other secondary scoliosis, site unspecified: Secondary | ICD-10-CM | POA: Diagnosis not present

## 2021-01-25 ENCOUNTER — Other Ambulatory Visit: Payer: Self-pay

## 2021-01-25 ENCOUNTER — Ambulatory Visit (HOSPITAL_COMMUNITY)
Admission: RE | Admit: 2021-01-25 | Discharge: 2021-01-25 | Disposition: A | Discharge status: Home / Self Care | Payer: Medicare Other | Source: Ambulatory Visit | Attending: Orthopedic Surgery | Admitting: Orthopedic Surgery

## 2021-01-25 DIAGNOSIS — Z96642 Presence of left artificial hip joint: Secondary | ICD-10-CM

## 2021-01-25 DIAGNOSIS — Z471 Aftercare following joint replacement surgery: Secondary | ICD-10-CM | POA: Diagnosis not present

## 2021-01-25 DIAGNOSIS — M25552 Pain in left hip: Secondary | ICD-10-CM | POA: Diagnosis not present

## 2021-01-25 MED ORDER — TECHNETIUM TC 99M MEDRONATE IV KIT
20.0000 | PACK | Freq: Once | INTRAVENOUS | Status: AC | PRN
  Administered 2021-01-25: 20 via INTRAVENOUS

## 2021-01-30 DIAGNOSIS — M5126 Other intervertebral disc displacement, lumbar region: Secondary | ICD-10-CM | POA: Diagnosis not present

## 2021-01-30 DIAGNOSIS — M6281 Muscle weakness (generalized): Secondary | ICD-10-CM | POA: Diagnosis not present

## 2021-01-30 DIAGNOSIS — M4150 Other secondary scoliosis, site unspecified: Secondary | ICD-10-CM | POA: Diagnosis not present

## 2021-01-30 DIAGNOSIS — M545 Low back pain, unspecified: Secondary | ICD-10-CM | POA: Diagnosis not present

## 2021-02-01 DIAGNOSIS — M5126 Other intervertebral disc displacement, lumbar region: Secondary | ICD-10-CM | POA: Diagnosis not present

## 2021-02-01 DIAGNOSIS — M6281 Muscle weakness (generalized): Secondary | ICD-10-CM | POA: Diagnosis not present

## 2021-02-01 DIAGNOSIS — M4150 Other secondary scoliosis, site unspecified: Secondary | ICD-10-CM | POA: Diagnosis not present

## 2021-02-01 DIAGNOSIS — M545 Low back pain, unspecified: Secondary | ICD-10-CM | POA: Diagnosis not present

## 2021-02-05 ENCOUNTER — Other Ambulatory Visit: Payer: Self-pay

## 2021-02-05 DIAGNOSIS — R059 Cough, unspecified: Secondary | ICD-10-CM | POA: Diagnosis not present

## 2021-02-05 DIAGNOSIS — I2510 Atherosclerotic heart disease of native coronary artery without angina pectoris: Secondary | ICD-10-CM | POA: Diagnosis not present

## 2021-02-05 DIAGNOSIS — R072 Precordial pain: Secondary | ICD-10-CM | POA: Diagnosis present

## 2021-02-05 DIAGNOSIS — N183 Chronic kidney disease, stage 3 unspecified: Secondary | ICD-10-CM | POA: Diagnosis not present

## 2021-02-05 DIAGNOSIS — Z8551 Personal history of malignant neoplasm of bladder: Secondary | ICD-10-CM | POA: Insufficient documentation

## 2021-02-05 DIAGNOSIS — R091 Pleurisy: Secondary | ICD-10-CM | POA: Insufficient documentation

## 2021-02-05 DIAGNOSIS — Z7982 Long term (current) use of aspirin: Secondary | ICD-10-CM | POA: Diagnosis not present

## 2021-02-05 DIAGNOSIS — N1831 Chronic kidney disease, stage 3a: Secondary | ICD-10-CM | POA: Diagnosis not present

## 2021-02-05 DIAGNOSIS — R079 Chest pain, unspecified: Secondary | ICD-10-CM | POA: Diagnosis not present

## 2021-02-05 DIAGNOSIS — I129 Hypertensive chronic kidney disease with stage 1 through stage 4 chronic kidney disease, or unspecified chronic kidney disease: Secondary | ICD-10-CM | POA: Insufficient documentation

## 2021-02-05 DIAGNOSIS — R051 Acute cough: Secondary | ICD-10-CM | POA: Diagnosis not present

## 2021-02-05 DIAGNOSIS — Z79899 Other long term (current) drug therapy: Secondary | ICD-10-CM | POA: Insufficient documentation

## 2021-02-05 DIAGNOSIS — Z96642 Presence of left artificial hip joint: Secondary | ICD-10-CM | POA: Insufficient documentation

## 2021-02-05 DIAGNOSIS — J069 Acute upper respiratory infection, unspecified: Secondary | ICD-10-CM | POA: Diagnosis not present

## 2021-02-05 DIAGNOSIS — M549 Dorsalgia, unspecified: Secondary | ICD-10-CM | POA: Insufficient documentation

## 2021-02-05 LAB — CBC
HCT: 36.7 % (ref 36.0–46.0)
Hemoglobin: 12.5 g/dL (ref 12.0–15.0)
MCH: 33.9 pg (ref 26.0–34.0)
MCHC: 34.1 g/dL (ref 30.0–36.0)
MCV: 99.5 fL (ref 80.0–100.0)
Platelets: 261 10*3/uL (ref 150–400)
RBC: 3.69 MIL/uL — ABNORMAL LOW (ref 3.87–5.11)
RDW: 12.2 % (ref 11.5–15.5)
WBC: 9.5 10*3/uL (ref 4.0–10.5)
nRBC: 0 % (ref 0.0–0.2)

## 2021-02-05 LAB — BASIC METABOLIC PANEL
Anion gap: 9 (ref 5–15)
BUN: 38 mg/dL — ABNORMAL HIGH (ref 8–23)
CO2: 26 mmol/L (ref 22–32)
Calcium: 9.3 mg/dL (ref 8.9–10.3)
Chloride: 101 mmol/L (ref 98–111)
Creatinine, Ser: 1.31 mg/dL — ABNORMAL HIGH (ref 0.44–1.00)
GFR, Estimated: 41 mL/min — ABNORMAL LOW (ref >60)
Glucose, Bld: 124 mg/dL — ABNORMAL HIGH (ref 70–99)
Potassium: 4.4 mmol/L (ref 3.5–5.1)
Sodium: 136 mmol/L (ref 135–145)

## 2021-02-05 LAB — TROPONIN I (HIGH SENSITIVITY): Troponin I (High Sensitivity): 7 ng/L (ref <18)

## 2021-02-05 LAB — GROUP A STREP BY PCR: Group A Strep by PCR: NOT DETECTED

## 2021-02-05 NOTE — ED Triage Notes (Signed)
Patient reports to the ER for chest pain. Patient reports she was seen by PCP today to r/o COVID and had a negative test and negative x-ray. Patient reports she has since developed chest pain in her left chest area. Denies N/v/D denies ShOB but endorses congestion and sore throat

## 2021-02-06 ENCOUNTER — Encounter (HOSPITAL_BASED_OUTPATIENT_CLINIC_OR_DEPARTMENT_OTHER): Payer: Self-pay | Admitting: Radiology

## 2021-02-06 ENCOUNTER — Emergency Department (HOSPITAL_BASED_OUTPATIENT_CLINIC_OR_DEPARTMENT_OTHER): Payer: Medicare Other

## 2021-02-06 ENCOUNTER — Emergency Department (HOSPITAL_BASED_OUTPATIENT_CLINIC_OR_DEPARTMENT_OTHER)
Admission: EM | Admit: 2021-02-06 | Discharge: 2021-02-06 | Disposition: A | Discharge status: Home / Self Care | Payer: Medicare Other | Attending: Emergency Medicine | Admitting: Emergency Medicine

## 2021-02-06 DIAGNOSIS — R091 Pleurisy: Secondary | ICD-10-CM

## 2021-02-06 DIAGNOSIS — I2699 Other pulmonary embolism without acute cor pulmonale: Secondary | ICD-10-CM | POA: Diagnosis not present

## 2021-02-06 DIAGNOSIS — R079 Chest pain, unspecified: Secondary | ICD-10-CM | POA: Diagnosis not present

## 2021-02-06 LAB — D-DIMER, QUANTITATIVE: D-Dimer, Quant: 1.4 ug/mL-FEU — ABNORMAL HIGH (ref 0.00–0.50)

## 2021-02-06 LAB — TROPONIN I (HIGH SENSITIVITY): Troponin I (High Sensitivity): 9 ng/L (ref <18)

## 2021-02-06 MED ORDER — ACETAMINOPHEN 325 MG PO TABS
650.0000 mg | ORAL_TABLET | Freq: Once | ORAL | Status: AC
  Administered 2021-02-06: 650 mg via ORAL
  Filled 2021-02-06: qty 2

## 2021-02-06 MED ORDER — IOHEXOL 350 MG/ML SOLN
100.0000 mL | Freq: Once | INTRAVENOUS | Status: AC | PRN
  Administered 2021-02-06: 50 mL via INTRAVENOUS

## 2021-02-06 MED ORDER — METHOCARBAMOL 500 MG PO TABS: 500.0000 mg | ORAL_TABLET | Freq: Two times a day (BID) | ORAL | 0 refills | Status: DC | PRN

## 2021-02-06 NOTE — ED Notes (Signed)
Pt Is back from CT

## 2021-02-06 NOTE — Discharge Instructions (Addendum)
Do not mix the tramadol with your Robaxin

## 2021-02-06 NOTE — ED Provider Notes (Signed)
[name] Provider Note   CSN: [id number] Arrival date & time: 02/05/21  2204     History Chief Complaint  Patient presents with   Chest Pain    Shannon Berger is a 80 y.o. female.  The history is provided by the patient.  Chest Pain Pain location:  Substernal area and L chest Pain quality: aching and sharp   Pain severity:  Moderate Onset quality:  Gradual Timing:  Intermittent Progression:  Waxing and waning Chronicity:  New Context: breathing, lifting and movement   Relieved by:  Rest Worsened by:  Movement and deep breathing Associated symptoms: back pain and cough   Associated symptoms: no abdominal pain, no diaphoresis, no fever, no nausea, no shortness of breath, no vomiting and no weakness      Patient history of TIA, carotid artery disease presents with chest pain.  She reports over the past several days she has had episodes of left chest pain.  It is worsened at times with breathing and sometimes worse with palpation.  She also has pain in her left upper back and shoulder.  She reports it may be related to recent physical therapy where she did a lot of arm work She also reports recent cough congestion and sore throat.  She was already seen by her PCP earlier in the day and had negative COVID screen and negative chest x-ray. Past Medical History:  Diagnosis Date   Anginal pain ([place])    occasionally   Anxiety    Arthritis    Bladder cancer ([place])    Carotid arterial disease ([place])    Chronic kidney disease    Complication of anesthesia    "hallucinates for days; have a hard time waking up; almost couldn't get her awake last time; wake up wild" (05/26/2018)   Constipation    Degeneration of lumbar intervertebral disc    Dislocation of hip joint prosthesis ([place])    Essential tremor    Fusion of spine of lumbar region    Heart murmur    "slight one" (05/26/2018)   Hyperlipidemia    Hypertension    Low back pain radiating to  left lower extremity    Melanoma ([place])    "center of upper chest"   Osteoarthritis    Osteopenia    Sleep difficulties    Spinal stenosis at L4-L5 level    Stroke [facility])    tia--last week   TIA (transient ischemic attack) 05/20/2018    Patient Active Problem List   Diagnosis Date Noted   Obstructive sleep apnea syndrome 05/25/2020   Chronic insomnia 05/25/2020   Drug-induced tremor 02/17/2020   Carotid stenosis 05/26/2018   Degenerative disc disease, cervical 05/21/2018   GERD (gastroesophageal reflux disease) 05/20/2018   CKD (chronic kidney disease), stage III ([place]) 05/20/2018   Anemia 05/20/2018   TIA (transient ischemic attack) 05/20/2018   Ischemic optic neuropathy of right eye 02/06/2017   Epiretinal membrane (ERM) of right eye 11/14/2016   Pseudophakia of both eyes 11/14/2016   Status post hardware removal 07/11/2016   Spinal stenosis of lumbar region S/P lumbar fusion 11/22/2015   Anxiety 11/22/2015   Constipation 11/22/2015   Depression 11/22/2015   Fusion of spine of lumbosacral region 05/02/2015   Leg pain, bilateral 03/22/2015   Leg weakness, bilateral 03/22/2015   H/O total hip arthroplasty 11/22/2014   Lumbar canal stenosis 09/28/2014   Pain of left sacroiliac joint 08/15/2014   Degeneration of intervertebral disc of lumbar region 08/01/2014  LBP (low back pain) 08/01/2014   Dislocation of hip joint prosthesis ([place]) 12/11/2012   Degenerative arthritis of hip 09/04/2012   Occlusion and stenosis of carotid artery without mention of cerebral infarction 12/25/2011   Chest pain 04/15/2011   PVC (premature ventricular contraction) 04/15/2011   Hypertension    Carotid arterial disease ([place])    Hyperlipidemia    Pilonidal cyst 02/05/2011    Past Surgical History:  Procedure Laterality Date   ANTERIOR LUMBAR FUSION     added rods and screws;   BACK SURGERY     BREAST BIOPSY Left    "not cancer"   BUNIONECTOMY Left    CAROTID ENDARTERECTOMY Left  05/26/2018   CATARACT EXTRACTION W/ INTRAOCULAR LENS  IMPLANT, BILATERAL Bilateral    ENDARTERECTOMY Left 05/26/2018   Procedure: ENDARTERECTOMY CAROTID LEFT;  Surgeon: Angelia Mould, MD;  Location: [facility];  Service: Vascular;  Laterality: Left;   EYE SURGERY Right    "retina wrinkle OR; now can't see out of that eye" (05/26/2018)   JOINT REPLACEMENT Left 2014   Hip   LUMBAR SPINE HARDWARE REMOVAL     "went in thru the front"   MELANOMA EXCISION     "center of upper chest"   PATCH ANGIOPLASTY Left 05/26/2018   Procedure: PATCH ANGIOPLASTY LEFT CAROTID ARTERY;  Surgeon: Angelia Mould, MD;  Location: [facility];  Service: Vascular;  Laterality: Left;   PILONIDAL CYST DRAINAGE     POSTERIOR LUMBAR FUSION  X 2   TENDON REPAIR Left    Reattachment of tendon- Left hip   THUMB FUSION Right    TONSILLECTOMY AND ADENOIDECTOMY     TOTAL HIP ARTHROPLASTY Left 09/04/2012   Procedure: LEFT TOTAL HIP ARTHROPLASTY ANTERIOR APPROACH;  Surgeon: Mcarthur Rossetti, MD;  Location: [facility];  Service: Orthopedics;  Laterality: Left;  Left Total Hip Arthroplasty, Anterior Approach   TRANSURETHRAL RESECTION OF BLADDER TUMOR N/A 06/06/2020   Procedure: TRANSURETHRAL RESECTION OF BLADDER TUMOR(TURBT) WITH CYSTOSCOPY/ INSTILLATION OF GEMCITABINE WITH RIGHT STENT PLACEMENT;  Surgeon: Ceasar Mons, MD;  Location: [facility];  Service: Urology;  Laterality: N/A;     OB History     Gravida  2   Para  2   Term      Preterm      AB      Living  2      SAB      IAB      Ectopic      Multiple      Live Births              Family History  Problem Relation Age of Onset   Heart failure Mother    Cancer Mother        colon   Hypertension Mother    Other Mother        Varicose Veins   Diabetes Father    Heart disease Father    Hypertension Father    Heart attack Father    Cancer Brother        squamos cell - head, neck, ear   Healthy Son    Migraines Daughter      Social History   Tobacco Use   Smoking status: Never   Smokeless tobacco: Never  Vaping Use   Vaping Use: Never used  Substance Use Topics   Alcohol use: Yes    Alcohol/week: 1.0 standard drink    Types: 1 Glasses of wine per week  Comment: Occas.   Drug use: Never    Home Medications Prior to Admission medications   Medication Sig Start Date End Date Taking? Authorizing Provider  methocarbamol (ROBAXIN) 500 MG tablet Take 1 tablet (500 mg total) by mouth 2 (two) times daily as needed for muscle spasms. 02/06/21  Yes Ripley Fraise, MD  Abaloparatide (TYMLOS [place]) Inject 80 mcg into the skin daily.  Patient not taking: Reported on 11/15/2020    [provider]  acetaminophen (TYLENOL) 500 MG tablet Take 500 mg by mouth every 6 (six) hours as needed for moderate pain or headache.    [provider]  ALPRAZolam Duanne Moron) 0.5 MG tablet Take 0.25 mg by mouth at bedtime as needed for sleep.  06/08/19   [provider]  Ascorbic Acid (VITAMIN C) 1000 MG tablet Take 1,000 mg by mouth 2 (two) times daily.    [provider]  aspirin EC 325 MG EC tablet Take 1 tablet (325 mg total) by mouth daily. 05/22/18   Amin, Jeanella Flattery, MD  atorvastatin (LIPITOR) 40 MG tablet Take 40 mg by mouth daily. 01/29/15   [provider]  bisoprolol (ZEBETA) 10 MG tablet Take 10 mg by mouth daily. 07/15/19   [provider]  Calcium Carbonate-Vit D-Min (CALCIUM 1200 PO) Take 1 tablet by mouth daily.    [provider]  Cholecalciferol (VITAMIN D) 2000 units tablet Take 2,000 Units by mouth daily.     [provider]  desvenlafaxine (PRISTIQ) 100 MG 24 hr tablet Take 100 mg by mouth daily.    [provider]  ezetimibe (ZETIA) 10 MG tablet Take 10 mg by mouth daily.    [provider]  furosemide (LASIX) 20 MG tablet Take 20 mg by mouth every Monday, Wednesday, and Friday.    [provider]  hydrALAZINE  (APRESOLINE) 25 MG tablet Take 25 mg by mouth 2 (two) times daily.    [provider]  lamoTRIgine (LAMICTAL) 25 MG tablet Take 25-75 mg by mouth See admin instructions. Take 25 mg at night for 2 weeks, then 50 mg at night for 2 weeks, then 75 mg at night 05/17/20   [provider]  Multiple Vitamin (MULTIVITAMIN) tablet Take 1 tablet by mouth daily.    [provider]  olmesartan (BENICAR) 40 MG tablet Take 40 mg by mouth daily. 03/15/20   [provider]  Omega-3 Fatty Acids (FISH OIL PO) Take 1,400 mg by mouth 2 (two) times daily.    [provider]  oxybutynin (DITROPAN) 5 MG tablet Take 1 tablet (5 mg total) by mouth every 8 (eight) hours as needed for bladder spasms. 06/06/20   Ceasar Mons, MD  phenazopyridine (PYRIDIUM) 200 MG tablet Take 1 tablet (200 mg total) by mouth 3 (three) times daily as needed (for pain with urination). Patient not taking: No sig reported 06/06/20 06/06/21  Ceasar Mons, MD  potassium chloride SA (KLOR-CON) 20 MEQ tablet Take 20 mEq by mouth every Monday, Wednesday, and Friday.    [provider]  REPATHA SURECLICK [id number] MG/ML SOAJ Inject 1 Syringe into the skin every 14 (fourteen) days. 07/11/20   [provider]  zinc gluconate 50 MG tablet Take 50 mg by mouth daily.    [provider]    Allergies    Oxycodone-acetaminophen, Versed [midazolam], and Hydrocodone-acetaminophen  Review of Systems   Review of Systems  Constitutional:  Negative for diaphoresis and fever.  HENT:  Positive for sore throat.  Respiratory:  Positive for cough. Negative for shortness of breath.   Cardiovascular:  Positive for chest pain.  Gastrointestinal:  Negative for abdominal pain, nausea and vomiting.  Musculoskeletal:  Positive for back pain.  Neurological:  Negative for weakness.  All other systems reviewed and are negative.  Physical Exam Updated Vital Signs BP (!) 150/108 (BP  Location: Right Arm)   Pulse 68   Temp 98.3 F (36.8 C) (Oral)   Resp 20   Ht 1.549 m ('5\' 1"'$ )   Wt 51.7 kg   SpO2 98%   BMI 21.54 kg/m   Physical Exam CONSTITUTIONAL: Elderly and frail, anxious HEAD: Normocephalic/atraumatic EYES: EOMI ENMT: Mucous membranes moist, uvula midline no erythema or exudates.  No stridor, no drooling. No dysphonia NECK: supple no meningeal signs SPINE/BACK:entire spine nontender CV: S1/S2 noted, no murmurs/rubs/gallops noted LUNGS: Lungs are clear to auscultation bilaterally, no apparent distress Chest-mild tenderness to left chest.  No rash or bruises noted to Breast, nurse chaperone present for exam ABDOMEN: soft, nontender, no rebound or guarding, bowel sounds noted throughout abdomen GU:no cva tenderness NEURO: Pt is awake/alert/appropriate, moves all extremitiesx4.  No facial droop.   EXTREMITIES: pulses normal/equal, full ROM, distal pulses equal and intact.  No calf tenderness or edema SKIN: warm, color normal PSYCH: Anxious  ED Results / Procedures / Treatments   Labs (all labs ordered are listed, but only abnormal results are displayed) Labs Reviewed  BASIC METABOLIC PANEL - Abnormal; Notable for the following components:      Result Value   Glucose, Bld 124 (*)    BUN 38 (*)    Creatinine, Ser 1.31 (*)    GFR, Estimated 41 (*)    All other components within normal limits  CBC - Abnormal; Notable for the following components:   RBC 3.69 (*)    All other components within normal limits  D-DIMER, QUANTITATIVE - Abnormal; Notable for the following components:   D-Dimer, Quant 1.40 (*)    All other components within normal limits  GROUP A STREP BY PCR  TROPONIN I (HIGH SENSITIVITY)  TROPONIN I (HIGH SENSITIVITY)    EKG EKG Interpretation  Date/Time:  Monday February 05 2021 22:10:17 EDT Ventricular Rate:  60 PR Interval:  166 QRS Duration: 74 QT Interval:  390 QTC Calculation: 390 R Axis:   55 Text Interpretation: Normal  sinus rhythm Normal ECG Confirmed by Ripley Fraise [phone]) on 02/06/2021 1:38:34 AM  Radiology CT Angio Chest PE W and/or Wo Contrast  Result Date: 02/06/2021 CLINICAL DATA:  80 year old female with history of chest pain. Evaluate for pulmonary embolism. EXAM: CT ANGIOGRAPHY CHEST WITH CONTRAST TECHNIQUE: Multidetector CT imaging of the chest was performed using the standard protocol during bolus administration of intravenous contrast. Multiplanar CT image reconstructions and MIPs were obtained to evaluate the vascular anatomy. CONTRAST:  8m OMNIPAQUE IOHEXOL 350 MG/ML SOLN COMPARISON:  Cardiac CT 06/01/2018.  Chest CTA 12/26/2015. FINDINGS: Cardiovascular: There are no filling defects within the pulmonary arterial tree to suggest pulmonary embolism. Heart size is normal. There is no significant pericardial fluid, thickening or pericardial calcification. There is aortic atherosclerosis, as well as atherosclerosis of the great vessels of the mediastinum and the coronary arteries, including calcified atherosclerotic plaque in the left main, left anterior descending and right coronary arteries. Mediastinum/Nodes: No pathologically enlarged mediastinal or hilar lymph nodes. Esophagus is unremarkable in appearance. No axillary lymphadenopathy. Lungs/Pleura: There are again some areas of mild cylindrical bronchiectasis with thickening of the peribronchovascular interstitium, regional architectural distortion  and chronic volume loss, along with some mild peribronchovascular micronodularity and ground-glass attenuation. These findings are similar to prior studies. No definite suspicious appearing pulmonary nodules or masses are noted. No confluent consolidative airspace disease. No pleural effusions. Mild bilateral apical nodular pleuroparenchymal thickening and architectural distortion, most compatible with chronic post infectious or inflammatory scarring. Upper Abdomen: Aortic atherosclerosis. 1.1 cm  low-attenuation lesion in the periphery of segment 4A of the liver, likely to represent a small cyst (incompletely characterized on today's examination which is essentially noncontrast at the level of the liver). Musculoskeletal: Orthopedic fixation hardware in the lumbar spine incompletely imaged. Chronic compression fracture of T11 with 70% loss of anterior vertebral body height. Chronic compression fracture of superior endplate of 624THL with [id number] loss of anterior vertebral body height. Kyphotic deformity centered at the level of T11. There are no aggressive appearing lytic or blastic lesions noted in the visualized portions of the skeleton. Review of the MIP images confirms the above findings. IMPRESSION: 1. No evidence of pulmonary embolism. 2. No acute findings in the thorax to account for the patient's symptoms. 3. 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 4. Aortic atherosclerosis, in addition to left main and 2 vessel coronary artery disease. Assessment for potential risk factor modification, dietary therapy or pharmacologic therapy may be warranted, if clinically indicated. 5. Additional incidental findings, as above. Aortic Atherosclerosis (ICD10-I70.0). Electronically Signed   By: Vinnie Langton M.D.   On: 02/06/2021 03:43    Procedures Procedures   Medications Ordered in ED Medications  acetaminophen (TYLENOL) tablet 650 mg (650 mg Oral Given 02/06/21 0208)  iohexol (OMNIPAQUE) 350 MG/ML injection 100 mL (50 mLs Intravenous Contrast Given 02/06/21 0322)    ED Course  I have reviewed the triage vital signs and the nursing notes.  Pertinent labs & imaging results that were available during my care of the patient were reviewed by me and considered in my medical decision making (see chart for details).    MDM Rules/Calculators/A&P                           Patient presents with chest  pain.  She has had some chest and back soreness from recent physical therapy, but also reported pleuritic chest pain. Patient does have extensive cardiac risk factors.  Some of her pain was reproducible on exam  Extensive evaluation Emergency Department is unremarkable for ACS/PE Multiple chronic/incidental findings noted on CT imaging Overall patient is well-appearing and appropriate for discharge home. We discussed work-up findings.  Discussed need to call cardiology today for close follow-up. She requests muscle relaxant, will give short course of Robaxin. Counseled extensively on appropriate use, she was advised it may make her drowsy.  Also advised not to take with tramadol Final Clinical Impression(s) / ED Diagnoses Final diagnoses:  Pleurisy    Rx / DC Orders ED Discharge Orders          Ordered    methocarbamol (ROBAXIN) 500 MG tablet  2 times daily PRN        02/06/21 0358             Ripley Fraise, MD 02/06/21 0401

## 2021-02-06 NOTE — ED Notes (Signed)
Patient transported to CT 

## 2021-02-08 DIAGNOSIS — N1831 Chronic kidney disease, stage 3a: Secondary | ICD-10-CM | POA: Diagnosis not present

## 2021-02-08 DIAGNOSIS — I129 Hypertensive chronic kidney disease with stage 1 through stage 4 chronic kidney disease, or unspecified chronic kidney disease: Secondary | ICD-10-CM | POA: Diagnosis not present

## 2021-02-08 DIAGNOSIS — E785 Hyperlipidemia, unspecified: Secondary | ICD-10-CM | POA: Diagnosis not present

## 2021-02-08 DIAGNOSIS — N390 Urinary tract infection, site not specified: Secondary | ICD-10-CM | POA: Diagnosis not present

## 2021-02-12 DIAGNOSIS — H10412 Chronic giant papillary conjunctivitis, left eye: Secondary | ICD-10-CM | POA: Diagnosis not present

## 2021-02-16 DIAGNOSIS — M5126 Other intervertebral disc displacement, lumbar region: Secondary | ICD-10-CM | POA: Diagnosis not present

## 2021-02-16 DIAGNOSIS — M4150 Other secondary scoliosis, site unspecified: Secondary | ICD-10-CM | POA: Diagnosis not present

## 2021-02-16 DIAGNOSIS — M545 Low back pain, unspecified: Secondary | ICD-10-CM | POA: Diagnosis not present

## 2021-02-16 DIAGNOSIS — M6281 Muscle weakness (generalized): Secondary | ICD-10-CM | POA: Diagnosis not present

## 2021-02-20 ENCOUNTER — Encounter (HOSPITAL_COMMUNITY): Payer: Medicare Other

## 2021-02-20 DIAGNOSIS — M5126 Other intervertebral disc displacement, lumbar region: Secondary | ICD-10-CM | POA: Diagnosis not present

## 2021-02-20 DIAGNOSIS — M6281 Muscle weakness (generalized): Secondary | ICD-10-CM | POA: Diagnosis not present

## 2021-02-20 DIAGNOSIS — M545 Low back pain, unspecified: Secondary | ICD-10-CM | POA: Diagnosis not present

## 2021-02-20 DIAGNOSIS — M4150 Other secondary scoliosis, site unspecified: Secondary | ICD-10-CM | POA: Diagnosis not present

## 2021-02-21 DIAGNOSIS — M5134 Other intervertebral disc degeneration, thoracic region: Secondary | ICD-10-CM | POA: Diagnosis not present

## 2021-02-21 DIAGNOSIS — M4120 Other idiopathic scoliosis, site unspecified: Secondary | ICD-10-CM | POA: Diagnosis not present

## 2021-02-21 DIAGNOSIS — M40204 Unspecified kyphosis, thoracic region: Secondary | ICD-10-CM | POA: Diagnosis not present

## 2021-02-21 DIAGNOSIS — M549 Dorsalgia, unspecified: Secondary | ICD-10-CM | POA: Diagnosis not present

## 2021-02-22 DIAGNOSIS — M4150 Other secondary scoliosis, site unspecified: Secondary | ICD-10-CM | POA: Diagnosis not present

## 2021-02-22 DIAGNOSIS — M545 Low back pain, unspecified: Secondary | ICD-10-CM | POA: Diagnosis not present

## 2021-02-22 DIAGNOSIS — M5126 Other intervertebral disc displacement, lumbar region: Secondary | ICD-10-CM | POA: Diagnosis not present

## 2021-02-22 DIAGNOSIS — M6281 Muscle weakness (generalized): Secondary | ICD-10-CM | POA: Diagnosis not present

## 2021-02-27 DIAGNOSIS — M545 Low back pain, unspecified: Secondary | ICD-10-CM | POA: Diagnosis not present

## 2021-02-27 DIAGNOSIS — M4150 Other secondary scoliosis, site unspecified: Secondary | ICD-10-CM | POA: Diagnosis not present

## 2021-02-27 DIAGNOSIS — M6281 Muscle weakness (generalized): Secondary | ICD-10-CM | POA: Diagnosis not present

## 2021-03-01 DIAGNOSIS — M545 Low back pain, unspecified: Secondary | ICD-10-CM | POA: Diagnosis not present

## 2021-03-01 DIAGNOSIS — M4150 Other secondary scoliosis, site unspecified: Secondary | ICD-10-CM | POA: Diagnosis not present

## 2021-03-01 DIAGNOSIS — M6281 Muscle weakness (generalized): Secondary | ICD-10-CM | POA: Diagnosis not present

## 2021-03-05 DIAGNOSIS — M4150 Other secondary scoliosis, site unspecified: Secondary | ICD-10-CM | POA: Diagnosis not present

## 2021-03-05 DIAGNOSIS — M545 Low back pain, unspecified: Secondary | ICD-10-CM | POA: Diagnosis not present

## 2021-03-05 DIAGNOSIS — M6281 Muscle weakness (generalized): Secondary | ICD-10-CM | POA: Diagnosis not present

## 2021-03-07 DIAGNOSIS — M6281 Muscle weakness (generalized): Secondary | ICD-10-CM | POA: Diagnosis not present

## 2021-03-07 DIAGNOSIS — M4150 Other secondary scoliosis, site unspecified: Secondary | ICD-10-CM | POA: Diagnosis not present

## 2021-03-07 DIAGNOSIS — M545 Low back pain, unspecified: Secondary | ICD-10-CM | POA: Diagnosis not present

## 2021-03-13 DIAGNOSIS — M4150 Other secondary scoliosis, site unspecified: Secondary | ICD-10-CM | POA: Diagnosis not present

## 2021-03-13 DIAGNOSIS — M545 Low back pain, unspecified: Secondary | ICD-10-CM | POA: Diagnosis not present

## 2021-03-13 DIAGNOSIS — M6281 Muscle weakness (generalized): Secondary | ICD-10-CM | POA: Diagnosis not present

## 2021-03-15 DIAGNOSIS — M545 Low back pain, unspecified: Secondary | ICD-10-CM | POA: Diagnosis not present

## 2021-03-15 DIAGNOSIS — M6281 Muscle weakness (generalized): Secondary | ICD-10-CM | POA: Diagnosis not present

## 2021-03-15 DIAGNOSIS — M4150 Other secondary scoliosis, site unspecified: Secondary | ICD-10-CM | POA: Diagnosis not present

## 2021-03-16 DIAGNOSIS — H524 Presbyopia: Secondary | ICD-10-CM | POA: Diagnosis not present

## 2021-03-16 DIAGNOSIS — H47011 Ischemic optic neuropathy, right eye: Secondary | ICD-10-CM | POA: Diagnosis not present

## 2021-03-16 DIAGNOSIS — H5212 Myopia, left eye: Secondary | ICD-10-CM | POA: Diagnosis not present

## 2021-03-16 DIAGNOSIS — H16143 Punctate keratitis, bilateral: Secondary | ICD-10-CM | POA: Insufficient documentation

## 2021-03-20 DIAGNOSIS — M4150 Other secondary scoliosis, site unspecified: Secondary | ICD-10-CM | POA: Diagnosis not present

## 2021-03-20 DIAGNOSIS — L84 Corns and callosities: Secondary | ICD-10-CM | POA: Diagnosis not present

## 2021-03-20 DIAGNOSIS — M6281 Muscle weakness (generalized): Secondary | ICD-10-CM | POA: Diagnosis not present

## 2021-03-20 DIAGNOSIS — D225 Melanocytic nevi of trunk: Secondary | ICD-10-CM | POA: Diagnosis not present

## 2021-03-20 DIAGNOSIS — L720 Epidermal cyst: Secondary | ICD-10-CM | POA: Diagnosis not present

## 2021-03-20 DIAGNOSIS — M545 Low back pain, unspecified: Secondary | ICD-10-CM | POA: Diagnosis not present

## 2021-03-20 DIAGNOSIS — L821 Other seborrheic keratosis: Secondary | ICD-10-CM | POA: Diagnosis not present

## 2021-03-22 DIAGNOSIS — M545 Low back pain, unspecified: Secondary | ICD-10-CM | POA: Diagnosis not present

## 2021-03-22 DIAGNOSIS — M6281 Muscle weakness (generalized): Secondary | ICD-10-CM | POA: Diagnosis not present

## 2021-03-22 DIAGNOSIS — M4150 Other secondary scoliosis, site unspecified: Secondary | ICD-10-CM | POA: Diagnosis not present

## 2021-03-26 DIAGNOSIS — M545 Low back pain, unspecified: Secondary | ICD-10-CM | POA: Diagnosis not present

## 2021-03-26 DIAGNOSIS — M6281 Muscle weakness (generalized): Secondary | ICD-10-CM | POA: Diagnosis not present

## 2021-03-26 DIAGNOSIS — M4150 Other secondary scoliosis, site unspecified: Secondary | ICD-10-CM | POA: Diagnosis not present

## 2021-03-27 DIAGNOSIS — R140 Abdominal distension (gaseous): Secondary | ICD-10-CM | POA: Diagnosis not present

## 2021-03-27 DIAGNOSIS — R12 Heartburn: Secondary | ICD-10-CM | POA: Diagnosis not present

## 2021-03-27 DIAGNOSIS — K6389 Other specified diseases of intestine: Secondary | ICD-10-CM | POA: Diagnosis not present

## 2021-03-29 DIAGNOSIS — M4150 Other secondary scoliosis, site unspecified: Secondary | ICD-10-CM | POA: Diagnosis not present

## 2021-03-29 DIAGNOSIS — M6281 Muscle weakness (generalized): Secondary | ICD-10-CM | POA: Diagnosis not present

## 2021-03-29 DIAGNOSIS — M545 Low back pain, unspecified: Secondary | ICD-10-CM | POA: Diagnosis not present

## 2021-04-03 DIAGNOSIS — M4150 Other secondary scoliosis, site unspecified: Secondary | ICD-10-CM | POA: Diagnosis not present

## 2021-04-03 DIAGNOSIS — M6281 Muscle weakness (generalized): Secondary | ICD-10-CM | POA: Diagnosis not present

## 2021-04-03 DIAGNOSIS — M545 Low back pain, unspecified: Secondary | ICD-10-CM | POA: Diagnosis not present

## 2021-04-05 DIAGNOSIS — M4150 Other secondary scoliosis, site unspecified: Secondary | ICD-10-CM | POA: Diagnosis not present

## 2021-04-05 DIAGNOSIS — M6281 Muscle weakness (generalized): Secondary | ICD-10-CM | POA: Diagnosis not present

## 2021-04-05 DIAGNOSIS — M545 Low back pain, unspecified: Secondary | ICD-10-CM | POA: Diagnosis not present

## 2021-04-10 DIAGNOSIS — M4150 Other secondary scoliosis, site unspecified: Secondary | ICD-10-CM | POA: Diagnosis not present

## 2021-04-10 DIAGNOSIS — M6281 Muscle weakness (generalized): Secondary | ICD-10-CM | POA: Diagnosis not present

## 2021-04-10 DIAGNOSIS — M545 Low back pain, unspecified: Secondary | ICD-10-CM | POA: Diagnosis not present

## 2021-04-12 DIAGNOSIS — M4150 Other secondary scoliosis, site unspecified: Secondary | ICD-10-CM | POA: Diagnosis not present

## 2021-04-12 DIAGNOSIS — M6281 Muscle weakness (generalized): Secondary | ICD-10-CM | POA: Diagnosis not present

## 2021-04-12 DIAGNOSIS — M545 Low back pain, unspecified: Secondary | ICD-10-CM | POA: Diagnosis not present

## 2021-04-13 DIAGNOSIS — N3281 Overactive bladder: Secondary | ICD-10-CM | POA: Diagnosis not present

## 2021-04-13 DIAGNOSIS — Z8551 Personal history of malignant neoplasm of bladder: Secondary | ICD-10-CM | POA: Diagnosis not present

## 2021-04-16 DIAGNOSIS — M545 Low back pain, unspecified: Secondary | ICD-10-CM | POA: Diagnosis not present

## 2021-04-16 DIAGNOSIS — M4150 Other secondary scoliosis, site unspecified: Secondary | ICD-10-CM | POA: Diagnosis not present

## 2021-04-16 DIAGNOSIS — M6281 Muscle weakness (generalized): Secondary | ICD-10-CM | POA: Diagnosis not present

## 2021-04-18 DIAGNOSIS — M6281 Muscle weakness (generalized): Secondary | ICD-10-CM | POA: Diagnosis not present

## 2021-04-18 DIAGNOSIS — M4150 Other secondary scoliosis, site unspecified: Secondary | ICD-10-CM | POA: Diagnosis not present

## 2021-04-18 DIAGNOSIS — M545 Low back pain, unspecified: Secondary | ICD-10-CM | POA: Diagnosis not present

## 2021-04-20 DIAGNOSIS — R231 Pallor: Secondary | ICD-10-CM | POA: Diagnosis not present

## 2021-04-20 DIAGNOSIS — R140 Abdominal distension (gaseous): Secondary | ICD-10-CM | POA: Diagnosis not present

## 2021-04-20 DIAGNOSIS — R5383 Other fatigue: Secondary | ICD-10-CM | POA: Diagnosis not present

## 2021-04-24 DIAGNOSIS — M4150 Other secondary scoliosis, site unspecified: Secondary | ICD-10-CM | POA: Diagnosis not present

## 2021-04-24 DIAGNOSIS — M545 Low back pain, unspecified: Secondary | ICD-10-CM | POA: Diagnosis not present

## 2021-04-24 DIAGNOSIS — M6281 Muscle weakness (generalized): Secondary | ICD-10-CM | POA: Diagnosis not present

## 2021-04-25 ENCOUNTER — Other Ambulatory Visit (HOSPITAL_COMMUNITY): Payer: Self-pay | Admitting: *Deleted

## 2021-04-26 ENCOUNTER — Ambulatory Visit (HOSPITAL_COMMUNITY)
Admission: RE | Admit: 2021-04-26 | Discharge: 2021-04-26 | Disposition: A | Discharge status: Home / Self Care | Payer: Medicare Other | Source: Ambulatory Visit | Attending: Internal Medicine | Admitting: Internal Medicine

## 2021-04-26 ENCOUNTER — Other Ambulatory Visit: Payer: Self-pay

## 2021-04-26 DIAGNOSIS — M810 Age-related osteoporosis without current pathological fracture: Secondary | ICD-10-CM | POA: Insufficient documentation

## 2021-04-26 MED ORDER — DENOSUMAB 60 MG/ML ~~LOC~~ SOSY
60.0000 mg | PREFILLED_SYRINGE | Freq: Once | SUBCUTANEOUS | Status: AC
  Administered 2021-04-26: 60 mg via SUBCUTANEOUS

## 2021-04-26 MED ORDER — DENOSUMAB 60 MG/ML ~~LOC~~ SOSY
PREFILLED_SYRINGE | SUBCUTANEOUS | Status: AC
  Filled 2021-04-26: qty 1

## 2021-04-27 DIAGNOSIS — M6281 Muscle weakness (generalized): Secondary | ICD-10-CM | POA: Diagnosis not present

## 2021-04-27 DIAGNOSIS — M4150 Other secondary scoliosis, site unspecified: Secondary | ICD-10-CM | POA: Diagnosis not present

## 2021-04-27 DIAGNOSIS — M545 Low back pain, unspecified: Secondary | ICD-10-CM | POA: Diagnosis not present

## 2021-05-01 DIAGNOSIS — M545 Low back pain, unspecified: Secondary | ICD-10-CM | POA: Diagnosis not present

## 2021-05-01 DIAGNOSIS — M4150 Other secondary scoliosis, site unspecified: Secondary | ICD-10-CM | POA: Diagnosis not present

## 2021-05-01 DIAGNOSIS — M6281 Muscle weakness (generalized): Secondary | ICD-10-CM | POA: Diagnosis not present

## 2021-05-03 DIAGNOSIS — M6281 Muscle weakness (generalized): Secondary | ICD-10-CM | POA: Diagnosis not present

## 2021-05-03 DIAGNOSIS — M4150 Other secondary scoliosis, site unspecified: Secondary | ICD-10-CM | POA: Diagnosis not present

## 2021-05-03 DIAGNOSIS — M545 Low back pain, unspecified: Secondary | ICD-10-CM | POA: Diagnosis not present

## 2021-05-08 DIAGNOSIS — M6281 Muscle weakness (generalized): Secondary | ICD-10-CM | POA: Diagnosis not present

## 2021-05-08 DIAGNOSIS — M4150 Other secondary scoliosis, site unspecified: Secondary | ICD-10-CM | POA: Diagnosis not present

## 2021-05-08 DIAGNOSIS — M545 Low back pain, unspecified: Secondary | ICD-10-CM | POA: Diagnosis not present

## 2021-05-10 DIAGNOSIS — M6281 Muscle weakness (generalized): Secondary | ICD-10-CM | POA: Diagnosis not present

## 2021-05-10 DIAGNOSIS — M4150 Other secondary scoliosis, site unspecified: Secondary | ICD-10-CM | POA: Diagnosis not present

## 2021-05-10 DIAGNOSIS — M545 Low back pain, unspecified: Secondary | ICD-10-CM | POA: Diagnosis not present

## 2021-05-15 DIAGNOSIS — M6281 Muscle weakness (generalized): Secondary | ICD-10-CM | POA: Diagnosis not present

## 2021-05-15 DIAGNOSIS — M545 Low back pain, unspecified: Secondary | ICD-10-CM | POA: Diagnosis not present

## 2021-05-15 DIAGNOSIS — M4150 Other secondary scoliosis, site unspecified: Secondary | ICD-10-CM | POA: Diagnosis not present

## 2021-05-17 DIAGNOSIS — M545 Low back pain, unspecified: Secondary | ICD-10-CM | POA: Diagnosis not present

## 2021-05-17 DIAGNOSIS — M4150 Other secondary scoliosis, site unspecified: Secondary | ICD-10-CM | POA: Diagnosis not present

## 2021-05-17 DIAGNOSIS — M6281 Muscle weakness (generalized): Secondary | ICD-10-CM | POA: Diagnosis not present

## 2021-05-22 DIAGNOSIS — M4150 Other secondary scoliosis, site unspecified: Secondary | ICD-10-CM | POA: Diagnosis not present

## 2021-05-22 DIAGNOSIS — M6281 Muscle weakness (generalized): Secondary | ICD-10-CM | POA: Diagnosis not present

## 2021-05-22 DIAGNOSIS — M545 Low back pain, unspecified: Secondary | ICD-10-CM | POA: Diagnosis not present

## 2021-05-24 DIAGNOSIS — M6281 Muscle weakness (generalized): Secondary | ICD-10-CM | POA: Diagnosis not present

## 2021-05-24 DIAGNOSIS — M545 Low back pain, unspecified: Secondary | ICD-10-CM | POA: Diagnosis not present

## 2021-05-24 DIAGNOSIS — M4150 Other secondary scoliosis, site unspecified: Secondary | ICD-10-CM | POA: Diagnosis not present

## 2021-06-12 ENCOUNTER — Ambulatory Visit (INDEPENDENT_AMBULATORY_CARE_PROVIDER_SITE_OTHER): Payer: Medicare Other | Admitting: Obstetrics & Gynecology

## 2021-06-12 ENCOUNTER — Other Ambulatory Visit: Payer: Self-pay

## 2021-06-12 ENCOUNTER — Encounter: Payer: Self-pay | Admitting: Obstetrics & Gynecology

## 2021-06-12 VITALS — BP 120/82 | HR 56 | Resp 14 | Ht 59.5 in | Wt 117.0 lb

## 2021-06-12 DIAGNOSIS — Z9189 Other specified personal risk factors, not elsewhere classified: Secondary | ICD-10-CM

## 2021-06-12 DIAGNOSIS — Z780 Asymptomatic menopausal state: Secondary | ICD-10-CM | POA: Diagnosis not present

## 2021-06-12 DIAGNOSIS — C679 Malignant neoplasm of bladder, unspecified: Secondary | ICD-10-CM

## 2021-06-12 DIAGNOSIS — M810 Age-related osteoporosis without current pathological fracture: Secondary | ICD-10-CM | POA: Diagnosis not present

## 2021-06-12 DIAGNOSIS — Z01419 Encounter for gynecological examination (general) (routine) without abnormal findings: Secondary | ICD-10-CM | POA: Diagnosis not present

## 2021-06-12 NOTE — Progress Notes (Signed)
Shannon Berger 1941-05-24 [id number]   History:    80 y.o. G2P2L2 Married   RP:  Established patient presenting for annual gyn exam    HPI: Postmenopause, well on no HRT.  No PMB.  No pelvic pain.  Abstinent.  Pap 2018 Neg.  SUI stable, wears a pad.  Bloated/more prominent stomach.  Gastro investigation 12/2020 completely negative.  Neuro for back pain.  Severe scoliosis, probably causing the change in position making the abdomen look more prominent...  Breasts wnl.  Mammo 01/2021 Neg.  BMI 23.24.  Early bladder cancer excised in 2021, followed by Urology.  Renal artery stenosis, referred to CV surgeon.  Health labs with Fam MD.  Osteoporosis on Prolia, followed by Dr Joylene Draft.  Colono 2021.   Past medical history,surgical history, family history and social history were all reviewed and documented in the EPIC chart.  Gynecologic History No LMP recorded. Patient is postmenopausal.  Obstetric History OB History  Gravida Para Term Preterm AB Living  2 2       2   SAB IAB Ectopic Multiple Live Births               # Outcome Date GA Lbr Len/2nd Weight Sex Delivery Anes PTL Lv  2 Para           1 Para              ROS: A ROS was performed and pertinent positives and negatives are included in the history.  GENERAL: No fevers or chills. HEENT: No change in vision, no earache, sore throat or sinus congestion. NECK: No pain or stiffness. CARDIOVASCULAR: No chest pain or pressure. No palpitations. PULMONARY: No shortness of breath, cough or wheeze. GASTROINTESTINAL: No abdominal pain, nausea, vomiting or diarrhea, melena or bright red blood per rectum. GENITOURINARY: No urinary frequency, urgency, hesitancy or dysuria. MUSCULOSKELETAL: No joint or muscle pain, no back pain, no recent trauma. DERMATOLOGIC: No rash, no itching, no lesions. ENDOCRINE: No polyuria, polydipsia, no heat or cold intolerance. No recent change in weight. HEMATOLOGICAL: No anemia or easy bruising or bleeding.  NEUROLOGIC: No headache, seizures, numbness, tingling or weakness. PSYCHIATRIC: No depression, no loss of interest in normal activity or change in sleep pattern.     Exam:   BP 120/82   Pulse (!) 56   Resp 14   Ht 4' 11.5" (1.511 m)   Wt 117 lb (53.1 kg)   BMI 23.24 kg/m   Body mass index is 23.24 kg/m.  General appearance : Well developed well nourished female. No acute distress HEENT: Eyes: no retinal hemorrhage or exudates,  Neck supple, trachea midline, no carotid bruits, no thyroidmegaly Lungs: Clear to auscultation, no rhonchi or wheezes, or rib retractions  Heart: Regular rate and rhythm, no murmurs or gallops Breast:Examined in sitting and supine position were symmetrical in appearance, no palpable masses or tenderness,  no skin retraction, no nipple inversion, no nipple discharge, no skin discoloration, no axillary or supraclavicular lymphadenopathy Abdomen: no palpable masses or tenderness, no rebound or guarding Extremities: no edema or skin discoloration or tenderness  Pelvic: Vulva: Normal             Vagina: No gross lesions or discharge  Cervix: No gross lesions or discharge  Uterus  AV, normal size, shape and consistency, non-tender and mobile  Adnexa  Without masses or tenderness  Anus: Normal   Assessment/Plan:  80 y.o. female for annual exam   1. Well female exam with routine  gynecological exam Postmenopause, well on no HRT.  No PMB.  No pelvic pain.  Abstinent.  Pap 2018 Neg.  SUI stable, wears a pad.  Bloated/more prominent stomach.  Gastro investigation 12/2020 completely negative.  Neuro for back pain.  Severe scoliosis, probably causing the change in position making the abdomen look more prominent...  Breasts wnl.  Mammo 01/2021 Neg.  BMI 23.24.  Early bladder cancer excised in 2021, followed by Urology.  Renal artery stenosis, referred to CV surgeon.  Health labs with Fam MD.  Osteoporosis on Prolia, followed by Dr Joylene Draft.  Colono 2021.  2. At risk of  fracture due to osteoporosis  3. Post-menopause Postmenopause, well on no HRT.  No PMB.  No pelvic pain.  Abstinent.  4. Age-related osteoporosis without current pathological fracture Osteoporosis on Prolia, followed by Dr Joylene Draft  5. Malignant neoplasm of urinary bladder, unspecified site ([place]) Excised in 2021.  Followed by Urology.  Other orders - denosumab (PROLIA) 60 MG/ML SOSY injection; Inject 60 mg into the skin every 6 (six) months. - OMEPRAZOLE PO; Take by mouth. - Probiotic Product (ALIGN PO); Take by mouth.   Princess Bruins MD, 11:32 AM 06/12/2021

## 2021-06-14 ENCOUNTER — Ambulatory Visit: Payer: Self-pay | Admitting: Obstetrics & Gynecology

## 2021-06-15 DIAGNOSIS — M810 Age-related osteoporosis without current pathological fracture: Secondary | ICD-10-CM | POA: Diagnosis not present

## 2021-06-15 DIAGNOSIS — I10 Essential (primary) hypertension: Secondary | ICD-10-CM | POA: Diagnosis not present

## 2021-06-15 DIAGNOSIS — E785 Hyperlipidemia, unspecified: Secondary | ICD-10-CM | POA: Diagnosis not present

## 2021-06-22 DIAGNOSIS — R82998 Other abnormal findings in urine: Secondary | ICD-10-CM | POA: Diagnosis not present

## 2021-06-22 DIAGNOSIS — E785 Hyperlipidemia, unspecified: Secondary | ICD-10-CM | POA: Diagnosis not present

## 2021-06-22 DIAGNOSIS — I2510 Atherosclerotic heart disease of native coronary artery without angina pectoris: Secondary | ICD-10-CM | POA: Diagnosis not present

## 2021-06-22 DIAGNOSIS — Z0000 Encounter for general adult medical examination without abnormal findings: Secondary | ICD-10-CM | POA: Diagnosis not present

## 2021-06-22 DIAGNOSIS — I129 Hypertensive chronic kidney disease with stage 1 through stage 4 chronic kidney disease, or unspecified chronic kidney disease: Secondary | ICD-10-CM | POA: Diagnosis not present

## 2021-07-04 DIAGNOSIS — M5134 Other intervertebral disc degeneration, thoracic region: Secondary | ICD-10-CM | POA: Diagnosis not present

## 2021-07-04 DIAGNOSIS — M40204 Unspecified kyphosis, thoracic region: Secondary | ICD-10-CM | POA: Diagnosis not present

## 2021-07-04 DIAGNOSIS — M810 Age-related osteoporosis without current pathological fracture: Secondary | ICD-10-CM | POA: Diagnosis not present

## 2021-07-10 DIAGNOSIS — L299 Pruritus, unspecified: Secondary | ICD-10-CM | POA: Insufficient documentation

## 2021-07-10 DIAGNOSIS — H6123 Impacted cerumen, bilateral: Secondary | ICD-10-CM | POA: Diagnosis not present

## 2021-07-11 ENCOUNTER — Other Ambulatory Visit: Payer: Self-pay

## 2021-07-11 DIAGNOSIS — I6523 Occlusion and stenosis of bilateral carotid arteries: Secondary | ICD-10-CM

## 2021-07-16 ENCOUNTER — Ambulatory Visit: Payer: Medicare Other | Admitting: Cardiology

## 2021-07-17 DIAGNOSIS — H903 Sensorineural hearing loss, bilateral: Secondary | ICD-10-CM | POA: Diagnosis not present

## 2021-07-19 ENCOUNTER — Ambulatory Visit: Payer: Medicare Other | Admitting: Vascular Surgery

## 2021-07-19 ENCOUNTER — Ambulatory Visit (HOSPITAL_COMMUNITY)
Admission: RE | Admit: 2021-07-19 | Discharge: 2021-07-19 | Disposition: A | Discharge status: Home / Self Care | Payer: Medicare Other | Source: Ambulatory Visit | Attending: Vascular Surgery | Admitting: Vascular Surgery

## 2021-07-19 ENCOUNTER — Encounter: Payer: Self-pay | Admitting: Vascular Surgery

## 2021-07-19 ENCOUNTER — Other Ambulatory Visit: Payer: Self-pay

## 2021-07-19 VITALS — BP 136/63 | HR 52 | Temp 97.7°F | Resp 18 | Ht 59.5 in | Wt 118.0 lb

## 2021-07-19 DIAGNOSIS — K551 Chronic vascular disorders of intestine: Secondary | ICD-10-CM | POA: Diagnosis not present

## 2021-07-19 DIAGNOSIS — I6523 Occlusion and stenosis of bilateral carotid arteries: Secondary | ICD-10-CM

## 2021-07-19 DIAGNOSIS — I771 Stricture of artery: Secondary | ICD-10-CM

## 2021-07-19 NOTE — Progress Notes (Signed)
REASON FOR VISIT:   History of mesenteric arterial disease and status post left carotid endarterectomy  MEDICAL ISSUES:   S/P LEFT CAROTID ENDARTERECTOMY: The patient is doing well and her left carotid endarterectomy site is widely patent.  There is no stenosis on the right side.  She is asymptomatic.  She is on aspirin and is on a statin.  Her activity is somewhat limited by her back pain but she does try to stay active.  She is not a smoker.  I explained that we like to get a carotid duplex scan yearly after carotid surgery.  I have offered to have her seen on the PA schedule but she would prefer to be seen by me.  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  She is asymptomatic.  She does not have any postprandial abdominal pain or weight loss.  I would only work this up further if she develops new symptoms.  LEG WEAKNESS: She does have some leg weakness which I think can be attributed to her degenerative disc disease of her back.  She has palpable posterior tibial pulses and I do not think this can be attributed to peripheral vascular disease.  HPI:   Shannon Berger is a pleasant 81 y.o. female who I last saw on 07/19/2020.  She did undergone a left carotid endarterectomy in 2019.  When I saw her last there was no evidence of recurrent stenosis on the left and the right side was widely patent.  She was on aspirin and was on a statin.  She comes in for a 1 year follow-up duplex.  In addition, she has a history of a moderate stenosis of the celiac axis and superior mesenteric artery.  Based on the velocities of her noninvasive studies were in the 70% range.  She was asymptomatic with no postprandial abdominal pain or weight loss.  I did not think any further work-up was indicated unless she develops significant symptoms.  Of note she does have a stage III chronic kidney disease and I was trying to  avoid contrast also.  Since I saw her last, she denies any history of stroke, TIAs, expressive or receptive aphasia, or amaurosis fugax.  I do not get any history of claudication.  Her activity is limited by some leg weakness.  She has significant back pain and degenerative disc disease of her back.  She does do some water aerobics which helps some.  She denies any abdominal pain.  Specifically she denies postprandial abdominal pain.  She denies any weight loss. Past Medical History:  Diagnosis Date   Anginal pain ([place])    occasionally   Anxiety    Arthritis    Bladder cancer ([place])    Carotid arterial disease ([place])    Chronic kidney disease    Complication of anesthesia    "hallucinates for days; have a hard time waking up; almost couldn't get her awake last time; wake up wild" (05/26/2018)   Constipation    Degeneration of lumbar intervertebral disc    Dislocation of hip joint prosthesis ([place])    Essential tremor    Fusion of spine of lumbar region    Heart murmur    "slight one" (05/26/2018)   Hyperlipidemia    Hypertension    Low back pain radiating to left lower extremity    Melanoma ([place])    "center of upper chest"   Osteoarthritis  Osteopenia    Sleep difficulties    Spinal stenosis at L4-L5 level    Stroke [facility])    tia--last week   TIA (transient ischemic attack) 05/20/2018    Family History  Problem Relation Age of Onset   Heart failure Mother    Cancer Mother        colon   Hypertension Mother    Other Mother        Varicose Veins   Diabetes Father    Heart disease Father    Hypertension Father    Heart attack Father    Cancer Brother        squamos cell - head, neck, ear   Healthy Son    Migraines Daughter     SOCIAL HISTORY: Social History   Tobacco Use   Smoking status: Never   Smokeless tobacco: Never  Substance Use Topics   Alcohol use: Yes    Alcohol/week: 1.0 standard drink    Types: 1 Glasses of wine per week    Comment: Occas.     Allergies  Allergen Reactions   Oxycodone-Acetaminophen     hallucinations   Versed [Midazolam] Other (See Comments)    Had trouble waking up    Hydrocodone-Acetaminophen     hallucination     Current Outpatient Medications  Medication Sig Dispense Refill   acetaminophen (TYLENOL) 500 MG tablet Take 500 mg by mouth every 6 (six) hours as needed for moderate pain or headache.     ALPRAZolam (XANAX) 0.5 MG tablet Take 0.25 mg by mouth at bedtime as needed for sleep.      Ascorbic Acid (VITAMIN C) 1000 MG tablet Take 1,000 mg by mouth 2 (two) times daily.     aspirin EC 325 MG EC tablet Take 1 tablet (325 mg total) by mouth daily. 30 tablet 0   atorvastatin (LIPITOR) 40 MG tablet Take 40 mg by mouth daily.  3   bisoprolol (ZEBETA) 10 MG tablet Take 10 mg by mouth daily.     Calcium Carbonate-Vit D-Min (CALCIUM 1200 PO) Take 1 tablet by mouth daily.     Cholecalciferol (VITAMIN D) 2000 units tablet Take 2,000 Units by mouth daily.      denosumab (PROLIA) 60 MG/ML SOSY injection Inject 60 mg into the skin every 6 (six) months.     desvenlafaxine (PRISTIQ) 100 MG 24 hr tablet Take 100 mg by mouth daily.     ezetimibe (ZETIA) 10 MG tablet Take 10 mg by mouth daily.     furosemide (LASIX) 20 MG tablet Take 20 mg by mouth every Monday, Wednesday, and Friday.     hydrALAZINE (APRESOLINE) 25 MG tablet Take 25 mg by mouth 2 (two) times daily.     lamoTRIgine (LAMICTAL) 25 MG tablet Take 25-75 mg by mouth See admin instructions. Currently taking 75mg  at HS     Multiple Vitamin (MULTIVITAMIN) tablet Take 1 tablet by mouth daily.     olmesartan (BENICAR) 40 MG tablet Take 40 mg by mouth daily.     Omega-3 Fatty Acids (FISH OIL PO) Take 1,400 mg by mouth 2 (two) times daily.     OMEPRAZOLE PO Take by mouth.     potassium chloride SA (KLOR-CON) 20 MEQ tablet Take 20 mEq by mouth every Monday, Wednesday, and Friday.     Probiotic Product (ALIGN PO) Take by mouth.     REPATHA SURECLICK 130 MG/ML  SOAJ Inject 1 Syringe into the skin every 14 (fourteen) days.  zinc gluconate 50 MG tablet Take 50 mg by mouth daily.     No current facility-administered medications for this visit.   Facility-Administered Medications Ordered in Other Visits  Medication Dose Route Frequency Provider Last Rate Last Admin   gemcitabine (GEMZAR) chemo syringe for bladder instillation 2,000 mg  2,000 mg Bladder Instillation Once Winter, Conception Oms, MD        REVIEW OF SYSTEMS:  [X]  denotes positive finding, [ ]  denotes negative finding Cardiac  Comments:  Chest pain or chest pressure:    Shortness of breath upon exertion:    Short of breath when lying flat:    Irregular heart rhythm:        Vascular    Pain in calf, thigh, or hip brought on by ambulation:    Pain in feet at night that wakes you up from your sleep:     Blood clot in your veins:    Leg swelling:         Pulmonary    Oxygen at home:    Productive cough:     Wheezing:         Neurologic    Sudden weakness in arms or legs:     Sudden numbness in arms or legs:     Sudden onset of difficulty speaking or slurred speech:    Temporary loss of vision in one eye:     Problems with dizziness:         Gastrointestinal    Blood in stool:     Vomited blood:         Genitourinary    Burning when urinating:     Blood in urine:        Psychiatric    Major depression:         Hematologic    Bleeding problems:    Problems with blood clotting too easily:        Skin    Rashes or ulcers:        Constitutional    Fever or chills:     PHYSICAL EXAM:   Vitals:   07/19/21 0939 07/19/21 0942  BP: 132/75 136/63  Pulse: (!) 52   Resp: 18   Temp: 97.7 F (36.5 C)   SpO2: 98%   Weight: 118 lb (53.5 kg)   Height: 4' 11.5" (1.511 m)     GENERAL: The patient is a well-nourished female, in no acute distress. The vital signs are documented above. CARDIAC: There is a regular rate and rhythm.  VASCULAR: I do not detect  carotid bruits. She has palpable femoral pulses and palpable posterior tibial pulses. PULMONARY: There is good air exchange bilaterally without wheezing or rales. ABDOMEN: Soft and non-tender with normal pitched bowel sounds.  MUSCULOSKELETAL: There are no major deformities or cyanosis. NEUROLOGIC: No focal weakness or paresthesias are detected. SKIN: There are no ulcers or rashes noted. PSYCHIATRIC: The patient has a normal affect.  DATA:    CAROTID DUPLEX: I have independently interpreted her carotid duplex scan today.  On the right side there is a less than 39% stenosis.  The right vertebral artery is patent with antegrade flow.  On the left side there is no evidence of recurrent carotid stenosis.  The left vertebral artery is patent with antegrade flow.  [name] [facility] [phone]

## 2021-07-23 DIAGNOSIS — E785 Hyperlipidemia, unspecified: Secondary | ICD-10-CM | POA: Diagnosis not present

## 2021-07-23 DIAGNOSIS — I10 Essential (primary) hypertension: Secondary | ICD-10-CM | POA: Diagnosis not present

## 2021-07-23 DIAGNOSIS — Z8551 Personal history of malignant neoplasm of bladder: Secondary | ICD-10-CM | POA: Diagnosis not present

## 2021-08-24 NOTE — Progress Notes (Signed)
Cardiology Clinic Note   Patient Name: Shannon Berger Date of Encounter: 08/27/2021  Primary Care Provider:  [name], MD Primary Cardiologist:  Peter Martinique, MD  Patient Profile    Shannon Berger 81 year old female presents to the clinic today for an evaluation of her chest pain.  Past Medical History    Past Medical History:  Diagnosis Date   Anginal pain ([place])    occasionally   Anxiety    Arthritis    Bladder cancer ([place])    Carotid arterial disease ([place])    Chronic kidney disease    Complication of anesthesia    "hallucinates for days; have a hard time waking up; almost couldn't get her awake last time; wake up wild" (05/26/2018)   Constipation    Degeneration of lumbar intervertebral disc    Dislocation of hip joint prosthesis ([place])    Essential tremor    Fusion of spine of lumbar region    Heart murmur    "slight one" (05/26/2018)   Hyperlipidemia    Hypertension    Low back pain radiating to left lower extremity    Melanoma ([place])    "center of upper chest"   Osteoarthritis    Osteopenia    Sleep difficulties    Spinal stenosis at L4-L5 level    Stroke [facility])    tia--last week   TIA (transient ischemic attack) 05/20/2018   Past Surgical History:  Procedure Laterality Date   ANTERIOR LUMBAR FUSION     added rods and screws;   BACK SURGERY     BREAST BIOPSY Left    "not cancer"   BUNIONECTOMY Left    CAROTID ENDARTERECTOMY Left 05/26/2018   CATARACT EXTRACTION W/ INTRAOCULAR LENS  IMPLANT, BILATERAL Bilateral    ENDARTERECTOMY Left 05/26/2018   Procedure: ENDARTERECTOMY CAROTID LEFT;  Surgeon: Angelia Mould, MD;  Location: [place];  Service: Vascular;  Laterality: Left;   EYE SURGERY Right    "retina wrinkle OR; now can't see out of that eye" (05/26/2018)   JOINT REPLACEMENT Left 2014   Hip   LUMBAR SPINE HARDWARE REMOVAL     "went in thru the front"   MELANOMA EXCISION     "center of upper chest"   PATCH ANGIOPLASTY  Left 05/26/2018   Procedure: PATCH ANGIOPLASTY LEFT CAROTID ARTERY;  Surgeon: Angelia Mould, MD;  Location: [place];  Service: Vascular;  Laterality: Left;   PILONIDAL CYST DRAINAGE     POSTERIOR LUMBAR FUSION  X 2   TENDON REPAIR Left    Reattachment of tendon- Left hip   THUMB FUSION Right    TONSILLECTOMY AND ADENOIDECTOMY     TOTAL HIP ARTHROPLASTY Left 09/04/2012   Procedure: LEFT TOTAL HIP ARTHROPLASTY ANTERIOR APPROACH;  Surgeon: Mcarthur Rossetti, MD;  Location: [facility];  Service: Orthopedics;  Laterality: Left;  Left Total Hip Arthroplasty, Anterior Approach   TRANSURETHRAL RESECTION OF BLADDER TUMOR N/A 06/06/2020   Procedure: TRANSURETHRAL RESECTION OF BLADDER TUMOR(TURBT) WITH CYSTOSCOPY/ INSTILLATION OF GEMCITABINE WITH RIGHT STENT PLACEMENT;  Surgeon: Ceasar Mons, MD;  Location: [facility];  Service: Urology;  Laterality: N/A;    Allergies  Allergies  Allergen Reactions   Oxycodone-Acetaminophen     hallucinations   Versed [Midazolam] Other (See Comments)    Had trouble waking up    Hydrocodone-Acetaminophen     hallucination     History of Present Illness    Shannon Berger has a PMH of nonobstructive CAD on coronary CTA, hypertension,  hyperlipidemia, carotid artery disease status post left CEA 2019, CVA, and ischemic optic neuropathy.  She was last seen by Roby Lofts, PA-C on 07/26/2019.  During that time she reported she felt fairly well.  She noted occasional aches in her chest that would last for seconds and occur without any pattern.  She felt that her discomfort was related to stress/anxiety.  She indicated that 2020 had been a stressful year.  She continued to have issues with her back and hip which limited her physical activity.  She denied shortness of breath, dyspnea, lower extremity edema, dizziness, lightheadedness, and syncope.  She was working with her PCP to achieve better blood pressure control.  She had been started on amlodipine 5  mg daily 06/2019.  She reported her blood pressure over the last 2 weeks have been in the 140s over 80s.  She contacted the nurse triage line today 07/12/2020.  She reported that she has been having chest pain on her left side off and on for the last 30 days.  She describes the pain as achy/dull, not heavy, no pressure and no shooting or sharp pain.  She denies shortness of breath, nausea/vomiting.  She is unsure if it was related to stress or musculoskeletal in nature.  She presented to the clinic  07/12/20 for follow-up evaluation and stated she  had occasional intermittent episodes of chest discomfort in the mornings.  She stated that dull aches would last for seconds to a minute and dissipate on its own.  She denied exertional chest pain.  She did note increased anxiety at times.  She reported she had been working with her PCP on her blood pressure.  She had a renal ultrasound and she had a visit scheduled with [facility] to talk about renal artery interventions.  I will gave the salty 6 diet sheet, had her increase her physical activity as tolerated, gave her the mindful stress reduction sheet and asked her follow-up with Dr. Martinique in 6 months and as needed.  She presents to the clinic today for follow-up evaluation states she presented to [facility] emergency department 8/22.  She was ruled out for ACS.  Her diagnosis was pleuritic chest pain/chest wall pain.  We reviewed her ED visit.  She also reports that her blood pressure is somewhat labile at home.  Her initial blood pressure in the office today is 194/92.  On recheck it was 132/68.  We reviewed her blood pressure medications and her anxiety.  It appears that a lot of her blood pressure fluctuations and chest discomfort is related to her anxiety.  We reviewed the after hours cardiology triage line.  Her and her husband expressed understanding.  We will continue her current medications, give her the mindfulness stress reduction she, and plan follow-up for 6  months.  I also asked her to increase her physical activity and begin going to the [facility] again and possibly start group exercise.    Today she denies chest pain, shortness of breath, lower extremity edema, fatigue, palpitations, melena, hematuria, hemoptysis, diaphoresis, weakness, presyncope, syncope, orthopnea, and PND.   Home Medications    Prior to Admission medications   Medication Sig Start Date End Date Taking? Authorizing Provider  Abaloparatide (TYMLOS [place]) Inject 80 mcg into the skin daily.     [provider]  acetaminophen (TYLENOL) 500 MG tablet Take 500 mg by mouth every 6 (six) hours as needed for moderate pain or headache.    [provider]  ALPRAZolam Duanne Moron) 0.5 MG  tablet Take 0.25 mg by mouth at bedtime as needed for sleep.  06/08/19   [provider]  Ascorbic Acid (VITAMIN C) 1000 MG tablet Take 1,000 mg by mouth 2 (two) times daily.    [provider]  aspirin EC 325 MG EC tablet Take 1 tablet (325 mg total) by mouth daily. 05/22/18   Amin, Jeanella Flattery, MD  atorvastatin (LIPITOR) 40 MG tablet Take 40 mg by mouth daily. 01/29/15   [provider]  bisoprolol (ZEBETA) 10 MG tablet Take 10 mg by mouth daily. 07/15/19   [provider]  Calcium Carbonate-Vit D-Min (CALCIUM 1200 PO) Take 1 tablet by mouth daily.    [provider]  Cholecalciferol (VITAMIN D) 2000 units tablet Take 2,000 Units by mouth daily.     [provider]  desvenlafaxine (PRISTIQ) 100 MG 24 hr tablet Take 100 mg by mouth daily.    [provider]  ezetimibe (ZETIA) 10 MG tablet Take 10 mg by mouth daily.    [provider]  furosemide (LASIX) 20 MG tablet Take 20 mg by mouth every Monday, Wednesday, and Friday.    [provider]  hydrALAZINE (APRESOLINE) 25 MG tablet Take 25 mg by mouth 2 (two) times daily.    [provider]  lamoTRIgine (LAMICTAL) 25 MG tablet Take 25-75 mg by mouth See admin  instructions. Take 25 mg at night for 2 weeks, then 50 mg at night for 2 weeks, then 75 mg at night 05/17/20   [provider]  Multiple Vitamin (MULTIVITAMIN) tablet Take 1 tablet by mouth daily.      [provider]  olmesartan (BENICAR) 40 MG tablet Take 40 mg by mouth daily. 03/15/20   [provider]  Omega-3 Fatty Acids (FISH OIL PO) Take 1,400 mg by mouth 2 (two) times daily.     [provider]  oxybutynin (DITROPAN) 5 MG tablet Take 1 tablet (5 mg total) by mouth every 8 (eight) hours as needed for bladder spasms. 06/06/20   Ceasar Mons, MD  phenazopyridine (PYRIDIUM) 200 MG tablet Take 1 tablet (200 mg total) by mouth 3 (three) times daily as needed (for pain with urination). 06/06/20 06/06/21  Ceasar Mons, MD  potassium chloride SA (KLOR-CON) 20 MEQ tablet Take 20 mEq by mouth every Monday, Wednesday, and Friday.    [provider]  zinc gluconate 50 MG tablet Take 50 mg by mouth daily.    [provider]    Family History    Family History  Problem Relation Age of Onset   Heart failure Mother    Cancer Mother        colon   Hypertension Mother    Other Mother        Varicose Veins   Diabetes Father    Heart disease Father    Hypertension Father    Heart attack Father    Cancer Brother        squamos cell - head, neck, ear   Healthy Son    Migraines Daughter    She indicated that her mother is deceased. She indicated that her father is deceased. She indicated that her brother is alive. She indicated that her maternal grandmother is deceased. She indicated that her maternal grandfather is deceased. She indicated that her paternal grandmother is deceased. She indicated that her paternal grandfather is deceased. She indicated that her daughter is alive. She indicated that her son is alive.  Social History  Social History   Socioeconomic History   Marital status: Married    Spouse name: Shannon Berger    Number of children: 2   Years of education: Not on file   Highest education level: Bachelor's degree (e.g., BA, AB, BS)  Occupational History   Occupation: Artist: RETIRED    Comment: retired  Tobacco Use   Smoking status: Never   Smokeless tobacco: Never  Vaping Use   Vaping Use: Never used  Substance and Sexual Activity   Alcohol use: Yes    Alcohol/week: 1.0 standard drink    Types: 1 Glasses of wine per week    Comment: Occas.   Drug use: Never   Sexual activity: Not Currently    Partners: Male    Comment: 1st intercourse- 64, partners- 36, married- 47 yrs   Other Topics Concern   Not on file  Social History Narrative   Pt lives at home with spouse Shannon Berger, 2 story home has 2 children   She is right handed, she drinks ginger ale and come tea, no coffee, or soda   Social Determinants of Radio broadcast assistant Strain: Not on file  Food Insecurity: Not on file  Transportation Needs: Not on file  Physical Activity: Not on file  Stress: Not on file  Social Connections: Not on file  Intimate Partner Violence: Not on file     Review of Systems    General:  No chills, fever, night sweats or weight changes.  Cardiovascular:  No chest pain, dyspnea on exertion, edema, orthopnea, palpitations, paroxysmal nocturnal dyspnea. Dermatological: No rash, lesions/masses Respiratory: No cough, dyspnea Urologic: No hematuria, dysuria Abdominal:   No nausea, vomiting, diarrhea, bright red blood per rectum, melena, or hematemesis Neurologic:  No visual changes, wkns, changes in mental status. All other systems reviewed and are otherwise negative except as noted above.  Physical Exam    VS:  BP 132/68    Pulse (!) 57    Ht 5\' 1"  (1.549 m)    Wt 117 lb 12.8 oz (53.4 kg)    SpO2 100%    BMI 22.26 kg/m  , BMI Body mass index is 22.26 kg/m. GEN: Well nourished, well developed, in no acute distress. HEENT: normal. Neck: Supple, no JVD, carotid bruits, or  masses. Cardiac: RRR, no murmurs, rubs, or gallops. No clubbing, cyanosis, edema.  Radials/DP/PT 2+ and equal bilaterally.  Respiratory:  Respirations regular and unlabored, clear to auscultation bilaterally. GI: Soft, nontender, nondistended, BS + x 4. MS: no deformity or atrophy. Skin: warm and dry, no rash. Neuro:  Strength and sensation are intact. Psych: Normal affect.  Accessory Clinical Findings    Recent Labs: 02/05/2021: BUN 38; Creatinine, Ser 1.31; Hemoglobin 12.5; Platelets 261; Potassium 4.4; Sodium 136   Recent Lipid Panel    Component Value Date/Time   CHOL 174 05/21/2018 0412   TRIG 59 05/21/2018 0412   HDL 71 05/21/2018 0412   CHOLHDL 2.5 05/21/2018 0412   VLDL 12 05/21/2018 0412   LDLCALC 91 05/21/2018 0412    ECG personally reviewed by me today-sinus bradycardia 54 bpm  EKG 07/12/2020  normal sinus rhythm 62 bpm no ST or T wave deviation- No acute changes  Coronary CTA 11/19 1. Coronary artery calcium score 260 Agatston units. This places the patient in the 73rd percentile for age and gender, suggesting intermediate risk for future cardiac events.   2.  Nonobstructive CAD  Echocardiogram 05/21/2018 Left ventricle: The cavity size was  normal. Wall thickness was   normal. Systolic function was normal. The estimated ejection   fraction was in the range of 60% to 65%. Wall motion was normal;   there were no regional wall motion abnormalities. Features are   consistent with a pseudonormal left ventricular filling pattern,   with concomitant abnormal relaxation and increased filling   pressure (grade 2 diastolic dysfunction). - Mitral valve: There was mild regurgitation. - Pulmonary arteries: Systolic pressure was mildly increased. PA   peak pressure: 34 mm Hg (S).  Carotid ultrasound 07/14/2019 Right Carotid: Velocities in the right ICA are consistent with a 1-39% stenosis.   Left Carotid: Velocities in the left ICA are consistent with a 1-39% stenosis.    Vertebrals:  Bilateral vertebral arteries demonstrate antegrade flow. Subclavians: Normal flow hemodynamics were seen in bilateral subclavian arteries.  Assessment & Plan   1.  Chest wall pain/ anxiety-had a another episode of chest discomfort 8/22.  She presented to [facility] emergency department and was ruled out for ACS.  It was felt that her chest pain was pleuritic in nature.  Contacted nurse triage line 07/12/2020 and indicated that she has been having intermittent episodes of chest discomfort.  Her chest discomfort lasts seconds to a minute and dissipates on its own.   Heart healthy low-sodium diet-salty 6 given Increase physical activity as tolerated Continue mindfulness stress reduction.-Reviewed  Essential hypertension-BP today 132/68.  Reports whitecoat syndrome.  Continues to be somewhat labile at home.  Appears to be related to anxiety.    Working with [facility] due to renal artery stenosis.  Has appointment scheduled with them next week.  Also working with Dr. Joylene Draft on blood pressure medication. Continue olmesartan, hydralazine, bisprolol Heart healthy low-sodium diet-salty 6 given Increase physical activity as tolerated  Hyperlipidemia-LDL 71 on 10/20 Continue atorvastatin, Zetia, [place] Heart healthy low-sodium high-fiber diet Increase physical activity as tolerated Follows with PCP  Carotid artery disease-carotid ultrasound 07/14/2019 showed bilateral ICA stenosis 1-39% Follows with Dr. Doren Custard  Disposition: Follow-up with Dr. Martinique or me in 6 months.  Shannon Ng. Ronella Plunk NP-C    08/27/2021, 3:58 PM [address] Office [phone] Fax [phone]  Notice: This dictation was prepared with Dragon dictation along with smaller phrase technology. Any transcriptional errors that result from this process are unintentional and may not be corrected upon review.

## 2021-08-27 ENCOUNTER — Ambulatory Visit: Payer: Medicare Other | Admitting: Physician Assistant

## 2021-08-27 ENCOUNTER — Ambulatory Visit: Payer: Medicare Other | Admitting: General Practice

## 2021-08-27 ENCOUNTER — Other Ambulatory Visit: Payer: Self-pay

## 2021-08-27 ENCOUNTER — Encounter: Payer: Self-pay | Admitting: General Practice

## 2021-08-27 VITALS — BP 132/68 | HR 57 | Ht 61.0 in | Wt 117.8 lb

## 2021-08-27 DIAGNOSIS — I10 Essential (primary) hypertension: Secondary | ICD-10-CM

## 2021-08-27 DIAGNOSIS — I6523 Occlusion and stenosis of bilateral carotid arteries: Secondary | ICD-10-CM

## 2021-08-27 DIAGNOSIS — E785 Hyperlipidemia, unspecified: Secondary | ICD-10-CM | POA: Diagnosis not present

## 2021-08-27 DIAGNOSIS — R0789 Other chest pain: Secondary | ICD-10-CM | POA: Diagnosis not present

## 2021-08-27 NOTE — Patient Instructions (Signed)
Medication Instructions:  The current medical regimen is effective;  continue present plan and medications as directed. Please refer to the Current Medication list given to you today.   *If you need a refill on your cardiac medications before your next appointment, please call your pharmacy*  Lab Work:   Testing/Procedures:  none    none  Special Instructions PLEASE READ AND FOLLOW SALTY 6-ATTACHED-1,800mg  daily  PLEASE START EXERCISING SLOWLY  AS TOLERATED, SEE ATTACHED   Follow-Up: Your next appointment:  6 month(s) In Person with Peter Martinique, MD   Please call our office 2 months in advance to schedule this appointment  :1  At [facility], you and your health needs are our priority.  As part of our continuing mission to provide you with exceptional heart care, we have created designated Provider Care Teams.  These Care Teams include your primary Cardiologist (physician) and Advanced Practice Providers (APPs -  Physician Assistants and Nurse Practitioners) who all work together to provide you with the care you need, when you need it.            6 SALTY THINGS TO AVOID     1,800MG  DAILY     Mindfulness-Based Stress Reduction Mindfulness-based stress reduction (MBSR) is a program that helps people learn to practice mindfulness. Mindfulness is the practice of consciously paying attention to the present moment. MBSR focuses on developing self-awareness, which lets you respond to life stress without judgment or negative feelings. It can be learned and practiced through techniques such as education, breathing exercises, meditation, and yoga. MBSR includes several mindfulness techniques in one program. MBSR works best when you understand the treatment, are willing to try new things, and can commit to spending time practicing what you learn. MBSR training may include learning about: How your feelings, thoughts, and reactions affect your body. New ways to respond to things that cause  negative thoughts to start (triggers). How to notice your thoughts and let go of them. Practicing awareness of everyday things that you normally do without thinking. The techniques and goals of different types of meditation. What are the benefits of MBSR? MBSR can have many benefits, which include helping you to: Develop self-awareness. This means knowing and understanding yourself. Learn skills and attitudes that help you to take part in your own health care. Learn new ways to care for yourself. Be more accepting about how things are, and let things go. Be less judgmental and approach things with an open mind. Be patient with yourself and trust yourself more. MBSR has also been shown to: Reduce negative emotions, such as sadness, overwhelm, and worry. Improve memory and focus. Change how you sense and react to pain. Boost your body's ability to fight infections. Help you connect better with other people. Improve your sense of well-being. How to practice mindfulness To do a basic awareness exercise: Find a comfortable place to sit. Pay attention to the present moment. Notice your thoughts, feelings, and surroundings just as they are. Avoid judging yourself, your feelings, or your surroundings. Make note of any judgment that comes up and let it go. Your mind may wander, and that is okay. Make note of when your thoughts drift, and return your attention to the present moment. To do basic mindfulness meditation: Find a comfortable place to sit. This may include a stable chair or a firm floor cushion. Sit upright with your back straight. Let your arms fall next to your sides, with your hands resting on your legs. If you  are sitting in a chair, rest your feet flat on the floor. If you are sitting on a cushion, cross your legs in front of you. Keep your head in a neutral position with your chin dropped slightly. Relax your jaw and rest the tip of your tongue on the roof of your mouth. Drop  your gaze to the floor or close your eyes. Breathe normally and pay attention to your breath. Feel the air moving in and out of your nose. Feel your belly expanding and relaxing with each breath. Your mind may wander, and that is okay. Make note of when your thoughts drift, and return your attention to your breath. Avoid judging yourself, your feelings, or your surroundings. Make note of any judgment or feelings that come up, let them go, and bring your attention back to your breath. When you are ready, lift your gaze or open your eyes. Pay attention to how your body feels after the meditation. Follow these instructions at home:  Find a local in-person or online MBSR program. Set aside some time regularly for mindfulness practice. Practice every day if you can. Even 10 minutes of practice is helpful. Find a mindfulness practice that works best for you. This may include one or more of the following: Meditation. This involves focusing your mind on a certain thought or activity. Breathing awareness exercises. These help you to stay present by focusing on your breath. Body scan. For this practice, you lie down and pay attention to each part of your body from head to toe. You can identify tension and soreness and consciously relax parts of your body. Yoga. Yoga involves stretching and breathing, and it can improve your ability to move and be flexible. It can also help you to test your body's limits, which can help you release stress. Mindful eating. This way of eating involves focusing on the taste, texture, color, and smell of each bite of food. This slows down eating and helps you feel full sooner. For this reason, it can be an important part of a weight loss plan. Find a podcast or recording that provides guidance for breathing awareness, body scan, or meditation exercises. You can listen to these any time when you have a free moment to rest without distractions. Follow your treatment plan as told by  your health care provider. This may include taking regular medicines and making changes to your diet or lifestyle as recommended. Where to find more information You can find more information about MBSR from: Your health care provider. Community-based meditation centers or programs. Programs offered near you. Summary Mindfulness-based stress reduction (MBSR) is a program that teaches you how to consciously pay attention to the present moment. It is used to help you deal better with daily stress, feelings, and pain. MBSR focuses on developing self-awareness, which allows you to respond to life stress without judgment or negative feelings. MBSR programs may involve learning different mindfulness practices, such as breathing exercises, meditation, yoga, body scan, or mindful eating. Find a mindfulness practice that works best for you, and set aside time for it on a regular basis. This information is not intended to replace advice given to you by your health care provider. Make sure you discuss any questions you have with your health care provider. Document Revised: 02/01/2021 Document Reviewed: 02/01/2021 Elsevier Patient Education  [place].

## 2021-09-07 DIAGNOSIS — H524 Presbyopia: Secondary | ICD-10-CM | POA: Diagnosis not present

## 2021-09-13 DIAGNOSIS — H35371 Puckering of macula, right eye: Secondary | ICD-10-CM | POA: Diagnosis not present

## 2021-09-13 DIAGNOSIS — H47011 Ischemic optic neuropathy, right eye: Secondary | ICD-10-CM | POA: Diagnosis not present

## 2021-09-13 DIAGNOSIS — Z961 Presence of intraocular lens: Secondary | ICD-10-CM | POA: Diagnosis not present

## 2021-09-19 DIAGNOSIS — F411 Generalized anxiety disorder: Secondary | ICD-10-CM | POA: Diagnosis not present

## 2021-09-19 DIAGNOSIS — F331 Major depressive disorder, recurrent, moderate: Secondary | ICD-10-CM | POA: Diagnosis not present

## 2021-10-03 DIAGNOSIS — M545 Low back pain, unspecified: Secondary | ICD-10-CM | POA: Diagnosis not present

## 2021-10-25 ENCOUNTER — Encounter: Payer: Self-pay | Admitting: Podiatry

## 2021-10-25 ENCOUNTER — Ambulatory Visit: Payer: Medicare Other | Admitting: Podiatry

## 2021-10-25 DIAGNOSIS — B351 Tinea unguium: Secondary | ICD-10-CM | POA: Diagnosis not present

## 2021-10-25 DIAGNOSIS — L84 Corns and callosities: Secondary | ICD-10-CM | POA: Diagnosis not present

## 2021-10-25 MED ORDER — CICLOPIROX 8 % EX SOLN: Freq: Every day | CUTANEOUS | 0 refills | Status: DC

## 2021-10-25 NOTE — Progress Notes (Signed)
?  Subjective:  ?Patient ID: Shannon Berger, female    DOB: 1940-09-07,   MRN: [id number] ? ?No chief complaint on file. ? ? ?81 y.o. female presents for concern of corns on the bottom of bilateral feet. Relates she would like them trimmed has had them trimmed in the past with relief. She also relates fungus on bilateral great toes. She has had laser treatment in the past which has helped . Denies any other pedal complaints. Denies n/v/f/c.  ? ?Past Medical History:  ?Diagnosis Date  ? Anginal pain ([place])   ? occasionally  ? Anxiety   ? Arthritis   ? Bladder cancer ([place])   ? Carotid arterial disease ([place])   ? Chronic kidney disease   ? Complication of anesthesia   ? "hallucinates for days; have a hard time waking up; almost couldn't get her awake last time; wake up wild" (05/26/2018)  ? Constipation   ? Degeneration of lumbar intervertebral disc   ? Dislocation of hip joint prosthesis ([place])   ? Essential tremor   ? Fusion of spine of lumbar region   ? Heart murmur   ? "slight one" (05/26/2018)  ? Hyperlipidemia   ? Hypertension   ? Low back pain radiating to left lower extremity   ? Melanoma ([place])   ? "center of upper chest"  ? Osteoarthritis   ? Osteopenia   ? Sleep difficulties   ? Spinal stenosis at L4-L5 level   ? Stroke [facility])   ? tia--last week  ? TIA (transient ischemic attack) 05/20/2018  ? ? ?Objective:  ?Physical Exam: ?Vascular: DP/PT pulses 2/4 bilateral. CFT <3 seconds. Normal hair growth on digits. No edema.  ?Skin. No lacerations or abrasions bilateral feet. Hyperkeratotic cored lesions noted sub first metatarsal bilateral. Also noted to right sub fifth met head and medial hallux and lateral second digit. Hallux nails bilateral are thickened elongated and discolored with subungual debris.  ?Musculoskeletal: MMT 5/5 bilateral lower extremities in DF, PF, Inversion and Eversion. Deceased ROM in DF of ankle joint. HAV deformity and bilateral hammertoes noted.  ?Neurological: Sensation intact to light  touch.  ? ?Assessment:  ? ?1. Corns and callosities   ?2. Onychomycosis   ? ? ? ?Plan:  ?Patient was evaluated and treated and all questions answered. ?-Discussed corns and calluses with patient and treatment options.  ?-Hyperkeratotic tissue was debrided with chisel without incident.  ?-Applied salycylic acid treatment to area with dressing. Advised to remove bandaging tomorrow.  ?-Encouraged daily moisturizing ?-Discussed use of pumice stone ?-Advised good supportive shoes and inserts ?-Discussed treatment options for painful dystrophic nails  ?-Discussed fungal nail treatment options including oral, topical, and laser treatments.  ?-Prescription for penlac provided.  ?Follow-up in 3 months for fungal nails  ? ? ? ?Lorenda Peck, DPM  ? ? ?

## 2021-11-02 DIAGNOSIS — H5212 Myopia, left eye: Secondary | ICD-10-CM | POA: Diagnosis not present

## 2021-11-02 DIAGNOSIS — H5319 Other subjective visual disturbances: Secondary | ICD-10-CM | POA: Diagnosis not present

## 2021-11-02 DIAGNOSIS — H43812 Vitreous degeneration, left eye: Secondary | ICD-10-CM | POA: Insufficient documentation

## 2021-11-02 DIAGNOSIS — H524 Presbyopia: Secondary | ICD-10-CM | POA: Diagnosis not present

## 2021-11-09 ENCOUNTER — Other Ambulatory Visit (HOSPITAL_COMMUNITY): Payer: Self-pay

## 2021-11-12 ENCOUNTER — Ambulatory Visit (HOSPITAL_COMMUNITY)
Admission: RE | Admit: 2021-11-12 | Discharge: 2021-11-12 | Disposition: A | Discharge status: Home / Self Care | Payer: Medicare Other | Source: Ambulatory Visit | Attending: Internal Medicine | Admitting: Internal Medicine

## 2021-11-12 DIAGNOSIS — M810 Age-related osteoporosis without current pathological fracture: Secondary | ICD-10-CM | POA: Diagnosis not present

## 2021-11-12 MED ORDER — DENOSUMAB 60 MG/ML ~~LOC~~ SOSY: 60.0000 mg | PREFILLED_SYRINGE | Freq: Once | SUBCUTANEOUS | Status: AC

## 2021-11-12 MED ORDER — DENOSUMAB 60 MG/ML ~~LOC~~ SOSY
PREFILLED_SYRINGE | SUBCUTANEOUS | Status: AC
  Administered 2021-11-12: 60 mg via SUBCUTANEOUS
  Filled 2021-11-12: qty 1

## 2021-12-12 DIAGNOSIS — F329 Major depressive disorder, single episode, unspecified: Secondary | ICD-10-CM | POA: Diagnosis not present

## 2021-12-12 DIAGNOSIS — G250 Essential tremor: Secondary | ICD-10-CM | POA: Diagnosis not present

## 2021-12-12 DIAGNOSIS — I2510 Atherosclerotic heart disease of native coronary artery without angina pectoris: Secondary | ICD-10-CM | POA: Diagnosis not present

## 2021-12-12 DIAGNOSIS — I129 Hypertensive chronic kidney disease with stage 1 through stage 4 chronic kidney disease, or unspecified chronic kidney disease: Secondary | ICD-10-CM | POA: Diagnosis not present

## 2021-12-31 ENCOUNTER — Encounter (HOSPITAL_COMMUNITY): Payer: Medicare Other

## 2022-01-04 ENCOUNTER — Telehealth: Payer: Self-pay | Admitting: Cardiovascular Disease

## 2022-01-04 ENCOUNTER — Other Ambulatory Visit: Payer: Self-pay | Admitting: Family Medicine

## 2022-01-04 DIAGNOSIS — I129 Hypertensive chronic kidney disease with stage 1 through stage 4 chronic kidney disease, or unspecified chronic kidney disease: Secondary | ICD-10-CM | POA: Diagnosis not present

## 2022-01-04 DIAGNOSIS — F329 Major depressive disorder, single episode, unspecified: Secondary | ICD-10-CM | POA: Diagnosis not present

## 2022-01-04 DIAGNOSIS — R079 Chest pain, unspecified: Secondary | ICD-10-CM

## 2022-01-04 DIAGNOSIS — I2510 Atherosclerotic heart disease of native coronary artery without angina pectoris: Secondary | ICD-10-CM | POA: Diagnosis not present

## 2022-01-04 NOTE — Telephone Encounter (Signed)
[facility] called, patient has changes on her EKG.

## 2022-01-04 NOTE — Telephone Encounter (Signed)
Reviewed ECG sent from [facility] and compared to previous ECG tracings.  It looks quite similar to the previous tracings, does not support a diagnosis of acute coronary syndrome.  I think she can have additional work-up as an outpatient for her brief episodes of chest pain (currently asymptomatic).  Suggest coronary CT angiogram and follow-up with Dr. Martinique.  Would provide prescription for sublingual nitroglycerin and instructed in appropriate use for any recurrent symptoms over the weekend and give precautions regarding ED evaluation (for an episode of persistent chest discomfort that does not resolve in less than 30 minutes).

## 2022-01-07 NOTE — Telephone Encounter (Signed)
Spoke to patient she stated she is scheduled to have a coronary ct this Friday 7/7 at [facility] scheduled with Dr.Jordan 7/17 at 10:30 am.

## 2022-01-09 DIAGNOSIS — I10 Essential (primary) hypertension: Secondary | ICD-10-CM | POA: Diagnosis not present

## 2022-01-09 DIAGNOSIS — R519 Headache, unspecified: Secondary | ICD-10-CM | POA: Diagnosis not present

## 2022-01-09 DIAGNOSIS — M25552 Pain in left hip: Secondary | ICD-10-CM | POA: Diagnosis not present

## 2022-01-09 DIAGNOSIS — E785 Hyperlipidemia, unspecified: Secondary | ICD-10-CM | POA: Diagnosis not present

## 2022-01-09 DIAGNOSIS — R079 Chest pain, unspecified: Secondary | ICD-10-CM | POA: Diagnosis not present

## 2022-01-11 ENCOUNTER — Ambulatory Visit
Admission: RE | Admit: 2022-01-11 | Discharge: 2022-01-11 | Disposition: A | Discharge status: Home / Self Care | Payer: Medicare Other | Source: Ambulatory Visit | Attending: Family Medicine | Admitting: Family Medicine

## 2022-01-11 DIAGNOSIS — R079 Chest pain, unspecified: Secondary | ICD-10-CM

## 2022-01-15 NOTE — Progress Notes (Signed)
Cardiology Office Note    Date:  01/21/2022   ID:  Shannon, Berger [birth year], MRN [id number]  PCP:  [name], MD  Cardiologist:  Dr. Martinique  No chief complaint on file.   History of Present Illness:  Shannon Berger is a 81 y.o. female is seen for follow up dyspnea. She has a  past medical history of carotid artery disease, hypertension, hyperlipidemia, ischemic optic neuropathy, and osteopenia.  She was seen in 2016 for evaluation of chest pain.  She was complaining of chest pain not related to exertion at the time.  She had a similar symptom in the past with normal stress echo in 2012 and normal ETT in 2008.  She eventually underwent a Myoview on 03/24/2015 which showed EF 68%, normal perfusion, overall low risk study.    Her carotid artery disease has been followed by Dr. [name] of vascular surgery.    She was seen by Almyra Deforest PA-C in October 2019 for symptoms of dyspnea on exertion. She had evaluation with an Echo showing normal LV systolic and valve function. Grade 2 diastolic dysfunction. Coronary CTA was done showing calcium score of 264 but nonobstructive CAD. It was felt that her scoliosis may be contributing to her dyspnea. She reported having PFTs but no results available for [place] to review.  In November 2019 she was admitted with aphasia and left sided weakness. CT and MRI showed no acute findings but carotid dopplers showed progression of carotid arterial disease with > 80% left carotid stenosis. She subsequently underwent left CEA by Dr [name] on 93/79/02 without complications. 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 This has been asymptomatic so managed conservatively.   She was seen in February 2023 by Coletta Memos NP for atypical chest pain for which she was evaluated at [facility] ED. In June she had recurrent chest pain and after  discussion with DOD Dr  Sallyanne Kuster it was decided to schedule her for coronary CTA. Unfortunately she had a coronary calcium score ordered instead which really wasn't helpful. She described the chest pain as coming over her in waves lasting several seconds at a time. Hasn't had any recurrence in the last 3 weeks.    Past Medical History:  Diagnosis Date   Anginal pain ([place])    occasionally   Anxiety    Arthritis    Bladder cancer ([place])    Carotid arterial disease ([place])    Chronic kidney disease    Complication of anesthesia    "hallucinates for days; have a hard time waking up; almost couldn't get her awake last time; wake up wild" (05/26/2018)   Constipation    Degeneration of lumbar intervertebral disc    Dislocation of hip joint prosthesis ([place])    Essential tremor    Fusion of spine of lumbar region    Heart murmur    "slight one" (05/26/2018)   Hyperlipidemia    Hypertension    Low back pain radiating to left lower extremity    Melanoma ([place])    "center of upper chest"   Osteoarthritis    Osteopenia    Sleep difficulties    Spinal stenosis at L4-L5 level    Stroke [facility])    tia--last week   TIA (transient ischemic attack) 05/20/2018    Past Surgical History:  Procedure Laterality Date   ANTERIOR LUMBAR FUSION     added rods and screws;  BACK SURGERY     BREAST BIOPSY Left    "not cancer"   BUNIONECTOMY Left    CAROTID ENDARTERECTOMY Left 05/26/2018   CATARACT EXTRACTION W/ INTRAOCULAR LENS  IMPLANT, BILATERAL Bilateral    ENDARTERECTOMY Left 05/26/2018   Procedure: ENDARTERECTOMY CAROTID LEFT;  Surgeon: Angelia Mould, MD;  Location: [place];  Service: Vascular;  Laterality: Left;   EYE SURGERY Right    "retina wrinkle OR; now can't see out of that eye" (05/26/2018)   JOINT REPLACEMENT Left 2014   Hip   LUMBAR SPINE HARDWARE REMOVAL     "went in thru the front"   MELANOMA EXCISION     "center of upper chest"   PATCH ANGIOPLASTY Left 05/26/2018   Procedure: PATCH ANGIOPLASTY  LEFT CAROTID ARTERY;  Surgeon: Angelia Mould, MD;  Location: [place];  Service: Vascular;  Laterality: Left;   PILONIDAL CYST DRAINAGE     POSTERIOR LUMBAR FUSION  X 2   TENDON REPAIR Left    Reattachment of tendon- Left hip   THUMB FUSION Right    TONSILLECTOMY AND ADENOIDECTOMY     TOTAL HIP ARTHROPLASTY Left 09/04/2012   Procedure: LEFT TOTAL HIP ARTHROPLASTY ANTERIOR APPROACH;  Surgeon: Mcarthur Rossetti, MD;  Location: [facility];  Service: Orthopedics;  Laterality: Left;  Left Total Hip Arthroplasty, Anterior Approach   TRANSURETHRAL RESECTION OF BLADDER TUMOR N/A 06/06/2020   Procedure: TRANSURETHRAL RESECTION OF BLADDER TUMOR(TURBT) WITH CYSTOSCOPY/ INSTILLATION OF GEMCITABINE WITH RIGHT STENT PLACEMENT;  Surgeon: Ceasar Mons, MD;  Location: [facility];  Service: Urology;  Laterality: N/A;    Current Medications: Outpatient Medications Prior to Visit  Medication Sig Dispense Refill   acetaminophen (TYLENOL) 500 MG tablet Take 500 mg by mouth every 6 (six) hours as needed for moderate pain or headache.     ALPRAZolam (XANAX) 0.5 MG tablet Take 0.25 mg by mouth at bedtime as needed for sleep.      Ascorbic Acid (VITAMIN C) 1000 MG tablet Take 1,000 mg by mouth 2 (two) times daily.     aspirin EC 325 MG EC tablet Take 1 tablet (325 mg total) by mouth daily. 30 tablet 0   atorvastatin (LIPITOR) 40 MG tablet Take 40 mg by mouth daily.  3   bisoprolol (ZEBETA) 10 MG tablet Take 10 mg by mouth daily.     Calcium Carbonate-Vit D-Min (CALCIUM 1200 PO) Take 1 tablet by mouth daily.     Cholecalciferol (VITAMIN D) 2000 units tablet Take 2,000 Units by mouth daily.      ciclopirox (PENLAC) 8 % solution Apply topically at bedtime. Apply over nail and surrounding skin. Apply daily over previous coat. After seven (7) days, may remove with alcohol and continue cycle. 6.6 mL 0   denosumab (PROLIA) 60 MG/ML SOSY injection Inject 60 mg into the skin every 6 (six) months.      desvenlafaxine (PRISTIQ) 100 MG 24 hr tablet Take 100 mg by mouth daily.     ezetimibe (ZETIA) 10 MG tablet Take 10 mg by mouth daily.     furosemide (LASIX) 20 MG tablet Take 20 mg by mouth every Monday, Wednesday, and Friday.     hydrALAZINE (APRESOLINE) 25 MG tablet Take 25 mg by mouth 2 (two) times daily.     lamoTRIgine (LAMICTAL) 25 MG tablet Take 25-75 mg by mouth See admin instructions. Currently taking '75mg'$  at HS     Multiple Vitamin (MULTIVITAMIN) tablet Take 1 tablet by mouth daily.  olmesartan (BENICAR) 40 MG tablet Take 40 mg by mouth daily.     Omega-3 Fatty Acids (FISH OIL PO) Take 1,400 mg by mouth 2 (two) times daily.     OMEPRAZOLE PO Take by mouth.     potassium chloride SA (KLOR-CON) 20 MEQ tablet Take 20 mEq by mouth every Monday, Wednesday, and Friday.     Probiotic Product (ALIGN PO) Take by mouth.     REPATHA SURECLICK 829 MG/ML SOAJ Inject 1 Syringe into the skin every 14 (fourteen) days.     zinc gluconate 50 MG tablet Take 50 mg by mouth daily.     Facility-Administered Medications Prior to Visit  Medication Dose Route Frequency Provider Last Rate Last Admin   gemcitabine (GEMZAR) chemo syringe for bladder instillation 2,000 mg  2,000 mg Bladder Instillation Once Winter, Conception Oms, MD         Allergies:   Oxycodone-acetaminophen, Versed [midazolam], and Hydrocodone-acetaminophen   Social History   Socioeconomic History   Marital status: Married    Spouse name: jerry   Number of children: 2   Years of education: Not on file   Highest education level: Bachelor's degree (e.g., BA, AB, BS)  Occupational History   Occupation: Artist: RETIRED    Comment: retired  Tobacco Use   Smoking status: Never   Smokeless tobacco: Never  Vaping Use   Vaping Use: Never used  Substance and Sexual Activity   Alcohol use: Yes    Alcohol/week: 1.0 standard drink of alcohol    Types: 1 Glasses of wine per week    Comment: Occas.   Drug  use: Never   Sexual activity: Not Currently    Partners: Male    Comment: 1st intercourse- 68, partners- 81, married- 32 yrs   Other Topics Concern   Not on file  Social History Narrative   Pt lives at home with spouse jerry, 2 story home has 2 children   She is right handed, she drinks ginger ale and come tea, no coffee, or soda   Social Determinants of Radio broadcast assistant Strain: Not on file  Food Insecurity: Not on file  Transportation Needs: Not on file  Physical Activity: Not on file  Stress: Not on file  Social Connections: Not on file     Family History:  The patient's family history includes Cancer in her brother and mother; Diabetes in her father; Healthy in her son; Heart attack in her father; Heart disease in her father; Heart failure in her mother; Hypertension in her father and mother; Migraines in her daughter; Other in her mother.   ROS:   Please see the history of present illness.    ROS All other systems reviewed and are negative.   PHYSICAL EXAM:   VS:  BP (!) 142/61   Pulse (!) 55   Ht 5' (1.524 m)   Wt 112 lb 6.4 oz (51 kg)   SpO2 100%   BMI 21.95 kg/m    GEN: Well nourished, well developed, in no acute distress  HEENT: normal  Neck: no JVD, carotid bruits, or masses Cardiac: RRR; no murmurs, rubs, or gallops,no edema  Respiratory:  clear to auscultation bilaterally, normal work of breathing GI: soft, nontender, nondistended, + BS MS: no deformity or atrophy  Skin: warm and dry, no rash Neuro:  Alert and Oriented x 3, Strength and sensation are intact Psych: euthymic mood, full affect  Wt Readings from Last 3 Encounters:  01/21/22 112 lb 6.4 oz (51 kg)  08/27/21 117 lb 12.8 oz (53.4 kg)  07/19/21 118 lb (53.5 kg)      Studies/Labs Reviewed:    Recent Labs: 02/05/2021: BUN 38; Creatinine, Ser 1.31; Hemoglobin 12.5; Platelets 261; Potassium 4.4; Sodium 136   Lipid Panel    Component Value Date/Time   CHOL 174 05/21/2018 0412   TRIG  59 05/21/2018 0412   HDL 71 05/21/2018 0412   CHOLHDL 2.5 05/21/2018 0412   VLDL 12 05/21/2018 0412   LDLCALC 91 05/21/2018 0412   Dated 04/20/21: creatinine 1.43. otherwise CMET and CBC normal. Dated 06/15/21: cholesterol 110, triglycerides 41, HDL 75, LDL 27.    Additional studies/ records that were reviewed today include:   Myoview 03/24/2015 Study Highlights   The left ventricular ejection fraction is hyperdynamic (>65%). Nuclear stress EF: 68%. There was no ST segment deviation noted during stress. The study is normal. This is a low risk study. Normal myocardial perfusion and function   Low risk lexiscan nuclear stress test demonstrating normal myocardial perfusion and function; EF 68%,   Echo 05/21/18: Study Conclusions   - Left ventricle: The cavity size was normal. Wall thickness was   normal. Systolic function was normal. The estimated ejection   fraction was in the range of 60% to 65%. Wall motion was normal;   there were no regional wall motion abnormalities. Features are   consistent with a pseudonormal left ventricular filling pattern,   with concomitant abnormal relaxation and increased filling   pressure (grade 2 diastolic dysfunction). - Mitral valve: There was mild regurgitation. - Pulmonary arteries: Systolic pressure was mildly increased. PA   peak pressure: 34 mm Hg (S).  ADDENDUM REPORT: 06/01/2018 22:20   CLINICAL DATA:  Chest pain   EXAM: Cardiac CTA   MEDICATIONS: Sub lingual nitro. '4mg'$  x 2   TECHNIQUE: The patient was scanned on a Siemens 007 slice scanner. Gantry rotation speed was 250 msecs. Collimation was 0.6 mm. A 100 kV prospective scan was triggered in the ascending thoracic aorta at 35-75% of the R-R interval. Average HR during the scan was 60 bpm. The 3D data set was interpreted on a dedicated work station using MPR, MIP and VRT modes. A total of 80cc of contrast was used.   FINDINGS: Non-cardiac: See separate report from  [facility].   The pulmonary veins drained normally to the left atrium.   Calcium Score: 260 Agatston units.   Coronary Arteries: Right dominant with no anomalies   LM: No plaque or stenosis.   LAD system: Calcified plaque proximal LAD with mild (<50%) stenosis.   Circumflex system: Moderate ramus with no significant disease. The AV LCx was relatively small with no plaque or stenosis.   RCA system: Calcified plaque with mild stenosis (<50%) in the proximal RCA.   IMPRESSION: 1. Coronary artery calcium score 260 Agatston units. This places the patient in the 73rd percentile for age and gender, suggesting intermediate risk for future cardiac events.   2.  Nonobstructive CAD.   Dalton Mclean   OVER-READ INTERPRETATION  CT CHEST   The following report is an over-read performed by radiologist Dr. Rolm Baptise of [facility], [place] on 06/01/2018. This over-read does not include interpretation of cardiac or coronary anatomy or pathology. The coronary CTA interpretation by the cardiologist is attached.   COMPARISON:  Chest CT 12/26/2015   FINDINGS: Vascular: Tortuous aorta.  No aneurysm.  Heart is normal size.   Mediastinum/Nodes: No adenopathy in the  lower mediastinum or hila.   Lungs/Pleura: No confluent opacities or effusions.   Upper Abdomen: Small low-density lesion in the right hepatic lobe is stable most compatible with cyst. No acute findings   Musculoskeletal: Chest wall soft tissues are unremarkable. No acute bony abnormality.   IMPRESSION: No acute extra cardiac abnormality.   Tortuous aorta.   Electronically Signed: By: Rolm Baptise M.D. On: 06/01/2018 11:45  ASSESSMENT:    1. Precordial pain   2. Elevated coronary artery calcium score      PLAN:  In order of problems listed above:  Chest pain: unclear etiology. Had nonobstructive CAD by CTA in 2019. Plan to repeat this. Unfortunately wrong scan ordered without contrast. Will  order CTA.   Bilateral carotid artery disease: s/p recent left CEA. Dopplers in Jan ok.   Hypertension: Blood pressure is well controlled.   Hyperlipidemia:on lipitor, Zetia and fish oil. Goal LDL < 70.  6.   Mesenteric stenosis - celiac axis and superior mesenteric arteries 70%. Followed by [facility].     Medication Adjustments/Labs and Tests Ordered: Current medicines are reviewed at length with the patient today.  Concerns regarding medicines are outlined above.  Medication changes, Labs and Tests ordered today are listed in the Patient Instructions below. There are no Patient Instructions on file for this visit.   Signed, Ovie Cornelio Martinique, MD  01/21/2022 10:53 AM    [facility] [place], [place], [place]  [address] Phone: [phone]; Fax: [phone]

## 2022-01-16 ENCOUNTER — Other Ambulatory Visit: Payer: Self-pay | Admitting: Internal Medicine

## 2022-01-16 DIAGNOSIS — R519 Headache, unspecified: Secondary | ICD-10-CM

## 2022-01-21 ENCOUNTER — Encounter: Payer: Self-pay | Admitting: Cardiology

## 2022-01-21 ENCOUNTER — Ambulatory Visit: Payer: Medicare Other | Admitting: Cardiology

## 2022-01-21 VITALS — BP 142/61 | HR 55 | Ht 60.0 in | Wt 112.4 lb

## 2022-01-21 DIAGNOSIS — R072 Precordial pain: Secondary | ICD-10-CM | POA: Diagnosis not present

## 2022-01-21 DIAGNOSIS — R931 Abnormal findings on diagnostic imaging of heart and coronary circulation: Secondary | ICD-10-CM

## 2022-01-21 LAB — BASIC METABOLIC PANEL
BUN/Creatinine Ratio: 20 (ref 12–28)
BUN: 26 mg/dL (ref 8–27)
CO2: 23 mmol/L (ref 20–29)
Calcium: 10.2 mg/dL (ref 8.7–10.3)
Chloride: 104 mmol/L (ref 96–106)
Creatinine, Ser: 1.31 mg/dL — ABNORMAL HIGH (ref 0.57–1.00)
Glucose: 92 mg/dL (ref 70–99)
Potassium: 5.3 mmol/L — ABNORMAL HIGH (ref 3.5–5.2)
Sodium: 143 mmol/L (ref 134–144)
eGFR: 41 mL/min/{1.73_m2} — ABNORMAL LOW (ref >59)

## 2022-01-21 NOTE — Patient Instructions (Addendum)
Medication Instructions:  Continue same medications *If you need a refill on your cardiac medications before your next appointment, please call your pharmacy*   Lab Work: Bmet today   Testing/Procedures: Coronary CT  will be scheduled after approved by insurance   Follow instructions below   Follow-Up: At [facility], you and your health needs are our priority.  As part of our continuing mission to provide you with exceptional heart care, we have created designated Provider Care Teams.  These Care Teams include your primary Cardiologist (physician) and Advanced Practice Providers (APPs -  Physician Assistants and Nurse Practitioners) who all work together to provide you with the care you need, when you need it.  We recommend signing up for the patient portal called "MyChart".  Sign up information is provided on this After Visit Summary.  MyChart is used to connect with patients for Virtual Visits (Telemedicine).  Patients are able to view lab/test results, encounter notes, upcoming appointments, etc.  Non-urgent messages can be sent to your provider as well.   To learn more about what you can do with MyChart, go to [email or web address].    Your next appointment:  To Be Determined after test    The format for your next appointment: Office   Provider:  Dr.Jordan      Your cardiac CT will be scheduled at one of the below locations:   [facility] [address] [place], [place] [address] [phone]  [facility] [address] [place], [place] [address] [phone]  If scheduled at [facility], please arrive at the [facility] (Entrance C2) of [facility] 30 minutes prior to test start time. You can use the FREE valet parking offered at entrance C (encouraged to control the heart rate for the test)  Proceed to the [facility] Radiology Department (first floor) to  check-in and test prep.  All radiology patients and guests should use entrance C2 at [facility], accessed from [facility], even though the hospital's physical address listed is [address].    If scheduled at [facility], please arrive 15 mins early for check-in and test prep.  Please follow these instructions carefully (unless otherwise directed):    On the Night Before the Test: Be sure to Drink plenty of water. Do not consume any caffeinated/decaffeinated beverages or chocolate 12 hours prior to your test. Do not take any antihistamines 12 hours prior to your test.   On the Day of the Test: Drink plenty of water until 1 hour prior to the test. Do not eat any food 4 hours prior to the test. You may take your regular medications prior to the test.  Take metoprolol 25 mg two hours prior to test. Hold Zebeta morning of test HOLD Furosemide morning of the test. FEMALES- please wear underwire-free bra if available, avoid dresses & tight clothing          After the Test: Drink plenty of water. After receiving IV contrast, you may experience a mild flushed feeling. This is normal. On occasion, you may experience a mild rash up to 24 hours after the test. This is not dangerous. If this occurs, you can take Benadryl 25 mg and increase your fluid intake. If you experience trouble breathing, this can be serious. If it is severe call 911 IMMEDIATELY. If it is mild, please call our office. If you take any of these medications:  Glipizide/Metformin, Avandament, Glucavance, please do not take 48 hours after completing test unless otherwise instructed.  We will call to schedule your test 2-4 weeks out understanding that some insurance companies will need an authorization prior to the service being performed.   For non-scheduling related questions, please contact the cardiac imaging nurse navigator should you have any  questions/concerns: Marchia Bond, Cardiac Imaging Nurse Navigator Gordy Clement, Cardiac Imaging Nurse Navigator [facility] Direct Office Dial: [phone]   For scheduling needs, including cancellations and rescheduling, please call [place], [phone].   Important Information About Sugar

## 2022-01-23 ENCOUNTER — Encounter: Payer: Self-pay | Admitting: Cardiovascular Disease

## 2022-01-23 NOTE — Telephone Encounter (Signed)
Error

## 2022-01-24 ENCOUNTER — Telehealth: Payer: Self-pay

## 2022-01-24 ENCOUNTER — Ambulatory Visit: Payer: Medicare Other | Admitting: Podiatry

## 2022-01-24 ENCOUNTER — Other Ambulatory Visit: Payer: Medicare Other

## 2022-01-24 DIAGNOSIS — Z8551 Personal history of malignant neoplasm of bladder: Secondary | ICD-10-CM | POA: Diagnosis not present

## 2022-01-24 MED ORDER — METOPROLOL TARTRATE 25 MG PO TABS: ORAL_TABLET | ORAL | 0 refills | Status: DC

## 2022-01-25 NOTE — Telephone Encounter (Signed)
Spoke to patient 7/20.She stated insurance denied payment for coronary calcium score.Advised to call PCP.PCP ordered calcium score.

## 2022-01-26 ENCOUNTER — Ambulatory Visit
Admission: RE | Admit: 2022-01-26 | Discharge: 2022-01-26 | Disposition: A | Discharge status: Home / Self Care | Payer: Medicare Other | Source: Ambulatory Visit | Attending: Internal Medicine | Admitting: Internal Medicine

## 2022-01-26 DIAGNOSIS — Z8669 Personal history of other diseases of the nervous system and sense organs: Secondary | ICD-10-CM | POA: Diagnosis not present

## 2022-01-26 DIAGNOSIS — R519 Headache, unspecified: Secondary | ICD-10-CM

## 2022-01-26 MED ORDER — GADOBENATE DIMEGLUMINE 529 MG/ML IV SOLN
10.0000 mL | Freq: Once | INTRAVENOUS | Status: AC | PRN
  Administered 2022-01-26: 10 mL via INTRAVENOUS

## 2022-01-28 ENCOUNTER — Other Ambulatory Visit: Payer: Medicare Other

## 2022-01-31 ENCOUNTER — Ambulatory Visit: Payer: Medicare Other | Admitting: Podiatry

## 2022-02-05 ENCOUNTER — Telehealth (HOSPITAL_COMMUNITY): Payer: Self-pay | Admitting: Emergency Medicine

## 2022-02-05 NOTE — Telephone Encounter (Signed)
Reaching out to patient to offer assistance regarding upcoming cardiac imaging study; pt verbalizes understanding of appt date/time, parking situation and where to check in, pre-test NPO status and medications ordered, and verified current allergies; name and call back number provided for further questions should they arise Marchia Bond RN Navigator Cardiac Imaging [facility] [phone] office [phone] cell  Arrival 930 w/c entrance Denies iv issues Aware of nitro Holding metoprolol since resting HR 55 Holding lasix 2 days to support kideny function also plenty of PO hydration

## 2022-02-07 ENCOUNTER — Ambulatory Visit (HOSPITAL_COMMUNITY)
Admission: RE | Admit: 2022-02-07 | Discharge: 2022-02-07 | Disposition: A | Discharge status: Home / Self Care | Payer: Medicare Other | Source: Ambulatory Visit | Attending: Cardiology | Admitting: Cardiology

## 2022-02-07 ENCOUNTER — Ambulatory Visit (HOSPITAL_COMMUNITY)
Admission: RE | Admit: 2022-02-07 | Discharge: 2022-02-07 | Disposition: A | Discharge status: Home / Self Care | Payer: Medicare Other | Source: Ambulatory Visit | Attending: Cardiovascular Disease | Admitting: Cardiovascular Disease

## 2022-02-07 ENCOUNTER — Ambulatory Visit (HOSPITAL_BASED_OUTPATIENT_CLINIC_OR_DEPARTMENT_OTHER)
Admission: RE | Admit: 2022-02-07 | Discharge: 2022-02-07 | Disposition: A | Discharge status: Home / Self Care | Payer: Medicare Other | Source: Ambulatory Visit | Attending: Cardiovascular Disease | Admitting: Cardiovascular Disease

## 2022-02-07 ENCOUNTER — Other Ambulatory Visit: Payer: Self-pay | Admitting: Cardiovascular Disease

## 2022-02-07 DIAGNOSIS — I2510 Atherosclerotic heart disease of native coronary artery without angina pectoris: Secondary | ICD-10-CM | POA: Diagnosis not present

## 2022-02-07 DIAGNOSIS — R072 Precordial pain: Secondary | ICD-10-CM | POA: Insufficient documentation

## 2022-02-07 DIAGNOSIS — R931 Abnormal findings on diagnostic imaging of heart and coronary circulation: Secondary | ICD-10-CM

## 2022-02-07 MED ORDER — IOHEXOL 350 MG/ML SOLN
100.0000 mL | Freq: Once | INTRAVENOUS | Status: AC | PRN
  Administered 2022-02-07: 100 mL via INTRAVENOUS

## 2022-02-07 MED ORDER — NITROGLYCERIN 0.4 MG SL SUBL
0.8000 mg | SUBLINGUAL_TABLET | Freq: Once | SUBLINGUAL | Status: AC
  Administered 2022-02-07: 0.8 mg via SUBLINGUAL

## 2022-02-07 MED ORDER — NITROGLYCERIN 0.4 MG SL SUBL
SUBLINGUAL_TABLET | SUBLINGUAL | Status: AC
  Filled 2022-02-07: qty 2

## 2022-02-13 DIAGNOSIS — H5213 Myopia, bilateral: Secondary | ICD-10-CM | POA: Diagnosis not present

## 2022-02-14 ENCOUNTER — Ambulatory Visit: Payer: Medicare Other | Admitting: Podiatry

## 2022-02-14 DIAGNOSIS — L84 Corns and callosities: Secondary | ICD-10-CM | POA: Diagnosis not present

## 2022-02-14 DIAGNOSIS — B351 Tinea unguium: Secondary | ICD-10-CM | POA: Diagnosis not present

## 2022-02-14 MED ORDER — CICLOPIROX 8 % EX SOLN: Freq: Every day | CUTANEOUS | 0 refills | Status: DC

## 2022-02-14 NOTE — Progress Notes (Signed)
  Subjective:  Patient ID: Shannon Berger, female    DOB: 08-01-1940,   MRN: [id number]  Chief Complaint  Patient presents with   Nail Problem     Routine foot care - corn & callus trim     81 y.o. female presents for follow-up of corns and fungal nails.  Relates she would like them trimmed has had them trimmed in the past with relief. She also relates fungus on bilateral great toes that she has been using the penlac.  States maybe some improvement.  Denies any other pedal complaints. Denies n/v/f/c.   Past Medical History:  Diagnosis Date   Anginal pain ([place])    occasionally   Anxiety    Arthritis    Bladder cancer ([place])    Carotid arterial disease ([place])    Chronic kidney disease    Complication of anesthesia    "hallucinates for days; have a hard time waking up; almost couldn't get her awake last time; wake up wild" (05/26/2018)   Constipation    Degeneration of lumbar intervertebral disc    Dislocation of hip joint prosthesis ([place])    Essential tremor    Fusion of spine of lumbar region    Heart murmur    "slight one" (05/26/2018)   Hyperlipidemia    Hypertension    Low back pain radiating to left lower extremity    Melanoma ([place])    "center of upper chest"   Osteoarthritis    Osteopenia    Sleep difficulties    Spinal stenosis at L4-L5 level    Stroke ([place])    tia--last week   TIA (transient ischemic attack) 05/20/2018    Objective:  Physical Exam: Vascular: DP/PT pulses 2/4 bilateral. CFT <3 seconds. Normal hair growth on digits. No edema.  Skin. No lacerations or abrasions bilateral feet. Hyperkeratotic cored lesions noted sub first metatarsal bilateral. Also noted to right sub fifth met head and medial hallux and lateral second digit. Hallux nails bilateral are thickened elongated and discolored with subungual debris.  Musculoskeletal: MMT 5/5 bilateral lower extremities in DF, PF, Inversion and Eversion. Deceased ROM in DF of ankle joint. HAV deformity  and bilateral hammertoes noted.  Neurological: Sensation intact to light touch.   Assessment:   1. Corns and callosities   2. Onychomycosis      Plan:  Patient was evaluated and treated and all questions answered. -Discussed corns and calluses with patient and treatment options.  -Hyperkeratotic tissue was debrided with chisel without incidentas courtesy.  -Applied salycylic acid treatment to area with dressing. Advised to remove bandaging tomorrow.  -Encouraged daily moisturizing -Discussed use of pumice stone -Advised good supportive shoes and inserts -Discussed treatment options for painful dystrophic nails  -Discussed fungal nail treatment options including oral, topical, and laser treatments.  -Penlac refilled.  Follow-up as needed.     Lorenda Peck, DPM

## 2022-02-17 NOTE — Progress Notes (Unsigned)
Cardiology Office Note    Date:  02/21/2022   ID:  Shannon Berger, Shannon Berger [birth year], MRN [id number]  PCP:  [name], MD  Cardiologist:  Dr. [place]  Chief Complaint  Patient presents with   Coronary Artery Disease    History of Present Illness:  Shannon Berger is a 81 y.o. female is seen for follow up dyspnea. She has a  past medical history of carotid artery disease, hypertension, hyperlipidemia, ischemic optic neuropathy, and osteopenia.  She was seen in 2016 for evaluation of chest pain.  She was complaining of chest pain not related to exertion at the time.  She had a similar symptom in the past with normal stress echo in 2012 and normal ETT in 2008.  She eventually underwent a Myoview on 03/24/2015 which showed EF 68%, normal perfusion, overall low risk study.    Her carotid artery disease has been followed by Dr. [name] of vascular surgery.    She was seen by Almyra Deforest PA-C in October 2019 for symptoms of dyspnea on exertion. She had evaluation with an Echo showing normal LV systolic and valve function. Grade 2 diastolic dysfunction. Coronary CTA was done showing calcium score of 264 but nonobstructive CAD. It was felt that her scoliosis may be contributing to her dyspnea. She reported having PFTs but no results available for [place] to review.  In November 2019 she was admitted with aphasia and left sided weakness. CT and MRI showed no acute findings but carotid dopplers showed progression of carotid arterial disease with > 80% left carotid stenosis. She subsequently underwent left CEA by Dr [name] on 46/27/03 without complications. 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 This has been asymptomatic so managed conservatively.   She was seen in February 2023 by Coletta Memos NP for atypical chest pain for which she was evaluated at [facility] ED. In June she had recurrent chest pain  and after  discussion with DOD Dr Sallyanne Kuster it was decided to schedule her for coronary CTA. Unfortunately she had a coronary calcium score ordered instead which really wasn't helpful. She described the chest pain as coming over her in waves lasting several seconds at a time. Hasn't had any recurrence in the last 3 weeks.   To evaluate further we did do a coronary CTA. This again showed nonobstructive CAD.   She does report that BP has been quite labile. Sometimes when standing she will suddenly get lightheaded and have to sit down. BP as low as 80/40 and 76/41. At other times BP has been elevated to 500-938 systolic. Only feels bad when BP low. No history of CHF or swelling. Not sure how she wound up on lasix.    Past Medical History:  Diagnosis Date   Anginal pain ([place])    occasionally   Anxiety    Arthritis    Bladder cancer ([place])    Carotid arterial disease ([place])    Chronic kidney disease    Complication of anesthesia    "hallucinates for days; have a hard time waking up; almost couldn't get her awake last time; wake up wild" (05/26/2018)   Constipation    Degeneration of lumbar intervertebral disc    Dislocation of hip joint prosthesis ([place])    Essential tremor    Fusion of spine of lumbar region    Heart murmur    "slight one" (05/26/2018)   Hyperlipidemia  Hypertension    Low back pain radiating to left lower extremity    Melanoma ([place])    "center of upper chest"   Osteoarthritis    Osteopenia    Sleep difficulties    Spinal stenosis at L4-L5 level    Stroke [facility])    tia--last week   TIA (transient ischemic attack) 05/20/2018    Past Surgical History:  Procedure Laterality Date   ANTERIOR LUMBAR FUSION     added rods and screws;   BACK SURGERY     BREAST BIOPSY Left    "not cancer"   BUNIONECTOMY Left    CAROTID ENDARTERECTOMY Left 05/26/2018   CATARACT EXTRACTION W/ INTRAOCULAR LENS  IMPLANT, BILATERAL Bilateral    ENDARTERECTOMY Left 05/26/2018   Procedure:  ENDARTERECTOMY CAROTID LEFT;  Surgeon: Angelia Mould, MD;  Location: [place];  Service: Vascular;  Laterality: Left;   EYE SURGERY Right    "retina wrinkle OR; now can't see out of that eye" (05/26/2018)   JOINT REPLACEMENT Left 2014   Hip   LUMBAR SPINE HARDWARE REMOVAL     "went in thru the front"   MELANOMA EXCISION     "center of upper chest"   PATCH ANGIOPLASTY Left 05/26/2018   Procedure: PATCH ANGIOPLASTY LEFT CAROTID ARTERY;  Surgeon: Angelia Mould, MD;  Location: [place];  Service: Vascular;  Laterality: Left;   PILONIDAL CYST DRAINAGE     POSTERIOR LUMBAR FUSION  X 2   TENDON REPAIR Left    Reattachment of tendon- Left hip   THUMB FUSION Right    TONSILLECTOMY AND ADENOIDECTOMY     TOTAL HIP ARTHROPLASTY Left 09/04/2012   Procedure: LEFT TOTAL HIP ARTHROPLASTY ANTERIOR APPROACH;  Surgeon: Mcarthur Rossetti, MD;  Location: [facility];  Service: Orthopedics;  Laterality: Left;  Left Total Hip Arthroplasty, Anterior Approach   TRANSURETHRAL RESECTION OF BLADDER TUMOR N/A 06/06/2020   Procedure: TRANSURETHRAL RESECTION OF BLADDER TUMOR(TURBT) WITH CYSTOSCOPY/ INSTILLATION OF GEMCITABINE WITH RIGHT STENT PLACEMENT;  Surgeon: Ceasar Mons, MD;  Location: [facility];  Service: Urology;  Laterality: N/A;    Current Medications: Outpatient Medications Prior to Visit  Medication Sig Dispense Refill   acetaminophen (TYLENOL) 500 MG tablet Take 500 mg by mouth every 6 (six) hours as needed for moderate pain or headache.     ALPRAZolam (XANAX) 0.5 MG tablet Take 0.25 mg by mouth at bedtime as needed for sleep.      Ascorbic Acid (VITAMIN C) 1000 MG tablet Take 1,000 mg by mouth 2 (two) times daily.     aspirin EC 81 MG tablet Take 1 tablet (81 mg total) by mouth daily. Swallow whole. 30 tablet 12   atorvastatin (LIPITOR) 40 MG tablet Take 40 mg by mouth daily.  3   bisoprolol (ZEBETA) 10 MG tablet Take 10 mg by mouth daily.     Calcium Carbonate-Vit D-Min (CALCIUM  1200 PO) Take 1 tablet by mouth daily.     Cholecalciferol (VITAMIN D) 2000 units tablet Take 2,000 Units by mouth daily.      ciclopirox (PENLAC) 8 % solution Apply topically at bedtime. Apply over nail and surrounding skin. Apply daily over previous coat. After seven (7) days, may remove with alcohol and continue cycle. 6.6 mL 0   denosumab (PROLIA) 60 MG/ML SOSY injection Inject 60 mg into the skin every 6 (six) months.     desvenlafaxine (PRISTIQ) 100 MG 24 hr tablet Take 100 mg by mouth daily.     ezetimibe (  ZETIA) 10 MG tablet Take 10 mg by mouth daily.     hydrALAZINE (APRESOLINE) 25 MG tablet Take 25 mg by mouth 2 (two) times daily.     lamoTRIgine (LAMICTAL) 25 MG tablet Take 25-75 mg by mouth See admin instructions. Currently taking '75mg'$  at HS     metoprolol tartrate (LOPRESSOR) 25 MG tablet Take 25 mg 2 hours before Coronary CT 1 tablet 0   Multiple Vitamin (MULTIVITAMIN) tablet Take 1 tablet by mouth daily.     olmesartan (BENICAR) 40 MG tablet Take 40 mg by mouth daily.     Omega-3 Fatty Acids (FISH OIL PO) Take 1,400 mg by mouth 2 (two) times daily.     OMEPRAZOLE PO Take by mouth.     Probiotic Product (ALIGN PO) Take by mouth.     REPATHA SURECLICK 283 MG/ML SOAJ Inject 1 Syringe into the skin every 14 (fourteen) days.     zinc gluconate 50 MG tablet Take 50 mg by mouth daily.     aspirin EC 325 MG EC tablet Take 1 tablet (325 mg total) by mouth daily. 30 tablet 0   furosemide (LASIX) 20 MG tablet Take 20 mg by mouth every Monday, Wednesday, and Friday.     potassium chloride SA (KLOR-CON) 20 MEQ tablet Take 20 mEq by mouth every Monday, Wednesday, and Friday.     Facility-Administered Medications Prior to Visit  Medication Dose Route Frequency Provider Last Rate Last Admin   gemcitabine (GEMZAR) chemo syringe for bladder instillation 2,000 mg  2,000 mg Bladder Instillation Once Winter, Conception Oms, MD         Allergies:   Oxycodone-acetaminophen, Versed [midazolam],  and Hydrocodone-acetaminophen   Social History   Socioeconomic History   Marital status: Married    Spouse name: jerry   Number of children: 2   Years of education: Not on file   Highest education level: Bachelor's degree (e.g., BA, AB, BS)  Occupational History   Occupation: Artist: RETIRED    Comment: retired  Tobacco Use   Smoking status: Never   Smokeless tobacco: Never  Vaping Use   Vaping Use: Never used  Substance and Sexual Activity   Alcohol use: Yes    Alcohol/week: 1.0 standard drink of alcohol    Types: 1 Glasses of wine per week    Comment: Occas.   Drug use: Never   Sexual activity: Not Currently    Partners: Male    Comment: 1st intercourse- 56, partners- 15, married- 59 yrs   Other Topics Concern   Not on file  Social History Narrative   Pt lives at home with spouse jerry, 2 story home has 2 children   She is right handed, she drinks ginger ale and come tea, no coffee, or soda   Social Determinants of Radio broadcast assistant Strain: Not on file  Food Insecurity: Not on file  Transportation Needs: Not on file  Physical Activity: Not on file  Stress: Not on file  Social Connections: Not on file     Family History:  The patient's family history includes Cancer in her brother and mother; Diabetes in her father; Healthy in her son; Heart attack in her father; Heart disease in her father; Heart failure in her mother; Hypertension in her father and mother; Migraines in her daughter; Other in her mother.   ROS:   Please see the history of present illness.    ROS All other systems reviewed and are  negative.   PHYSICAL EXAM:   VS:  BP 122/70   Pulse (!) 54   Ht 5' (1.524 m)   Wt 101 lb 12.8 oz (46.2 kg)   BMI 19.88 kg/m    GEN: Well nourished, well developed, in no acute distress  HEENT: normal  Neck: no JVD, carotid bruits, or masses Cardiac: RRR; no murmurs, rubs, or gallops,no edema  Respiratory:  clear to auscultation  bilaterally, normal work of breathing GI: soft, nontender, nondistended, + BS MS: no deformity or atrophy  Skin: warm and dry, no rash Neuro:  Alert and Oriented x 3, Strength and sensation are intact Psych: euthymic mood, full affect  Wt Readings from Last 3 Encounters:  02/21/22 101 lb 12.8 oz (46.2 kg)  01/21/22 112 lb 6.4 oz (51 kg)  08/27/21 117 lb 12.8 oz (53.4 kg)      Studies/Labs Reviewed:    Recent Labs: 01/21/2022: BUN 26; Creatinine, Ser 1.31; Potassium 5.3; Sodium 143   Lipid Panel    Component Value Date/Time   CHOL 174 05/21/2018 0412   TRIG 59 05/21/2018 0412   HDL 71 05/21/2018 0412   CHOLHDL 2.5 05/21/2018 0412   VLDL 12 05/21/2018 0412   LDLCALC 91 05/21/2018 0412   Dated 04/20/21: creatinine 1.43. otherwise CMET and CBC normal. Dated 06/15/21: cholesterol 110, triglycerides 41, HDL 75, LDL 27.    Additional studies/ records that were reviewed today include:   Myoview 03/24/2015 Study Highlights   The left ventricular ejection fraction is hyperdynamic (>65%). Nuclear stress EF: 68%. There was no ST segment deviation noted during stress. The study is normal. This is a low risk study. Normal myocardial perfusion and function   Low risk lexiscan nuclear stress test demonstrating normal myocardial perfusion and function; EF 68%,   Echo 05/21/18: Study Conclusions   - Left ventricle: The cavity size was normal. Wall thickness was   normal. Systolic function was normal. The estimated ejection   fraction was in the range of 60% to 65%. Wall motion was normal;   there were no regional wall motion abnormalities. Features are   consistent with a pseudonormal left ventricular filling pattern,   with concomitant abnormal relaxation and increased filling   pressure (grade 2 diastolic dysfunction). - Mitral valve: There was mild regurgitation. - Pulmonary arteries: Systolic pressure was mildly increased. PA   peak pressure: 34 mm Hg (S).  ADDENDUM  REPORT: 06/01/2018 22:20   CLINICAL DATA:  Chest pain   EXAM: Cardiac CTA   MEDICATIONS: Sub lingual nitro. '4mg'$  x 2   TECHNIQUE: The patient was scanned on a Siemens 465 slice scanner. Gantry rotation speed was 250 msecs. Collimation was 0.6 mm. A 100 kV prospective scan was triggered in the ascending thoracic aorta at 35-75% of the R-R interval. Average HR during the scan was 60 bpm. The 3D data set was interpreted on a dedicated work station using MPR, MIP and VRT modes. A total of 80cc of contrast was used.   FINDINGS: Non-cardiac: See separate report from [facility].   The pulmonary veins drained normally to the left atrium.   Calcium Score: 260 Agatston units.   Coronary Arteries: Right dominant with no anomalies   LM: No plaque or stenosis.   LAD system: Calcified plaque proximal LAD with mild (<50%) stenosis.   Circumflex system: Moderate ramus with no significant disease. The AV LCx was relatively small with no plaque or stenosis.   RCA system: Calcified plaque with mild stenosis (<50%) in the  proximal RCA.   IMPRESSION: 1. Coronary artery calcium score 260 Agatston units. This places the patient in the 73rd percentile for age and gender, suggesting intermediate risk for future cardiac events.   2.  Nonobstructive CAD.   Dalton Mclean   OVER-READ INTERPRETATION  CT CHEST   The following report is an over-read performed by radiologist Dr. Rolm Baptise of [facility], [place] on 06/01/2018. This over-read does not include interpretation of cardiac or coronary anatomy or pathology. The coronary CTA interpretation by the cardiologist is attached.   COMPARISON:  Chest CT 12/26/2015   FINDINGS: Vascular: Tortuous aorta.  No aneurysm.  Heart is normal size.   Mediastinum/Nodes: No adenopathy in the lower mediastinum or hila.   Lungs/Pleura: No confluent opacities or effusions.   Upper Abdomen: Small low-density lesion in the right hepatic  lobe is stable most compatible with cyst. No acute findings   Musculoskeletal: Chest wall soft tissues are unremarkable. No acute bony abnormality.   IMPRESSION: No acute extra cardiac abnormality.   Tortuous aorta.   Electronically Signed: By: Rolm Baptise M.D. On: 06/01/2018 11:45  Coronary CTA 02/07/22:  EXAM: OVER-READ INTERPRETATION  CT CHEST   The following report is a limited chest CT over-read performed by radiologist Dr. Lindaann Slough [facility] on 02/07/2022. This over-read does not include interpretation of cardiac or coronary anatomy or pathology. The coronary CTA interpretation by the cardiologist is attached.   COMPARISON:  Chest CTA 02/06/2021   FINDINGS: Mediastinum/Nodes: No enlarged lymph nodes within the visualized mediastinum.   Lungs/Pleura: There is no pleural effusion. Stable mild bronchiectasis and scarring, primarily within the right middle lobe and lingula. No airspace disease or suspicious pulmonary nodule.   Upper abdomen: The visualized upper abdomen appears stable without significant findings. Probable cyst in the dome of the right hepatic lobe appears unchanged.   Musculoskeletal/Chest wall: No chest wall mass or suspicious osseous findings within the visualized chest. Stable chronic compression deformities at T11 and T12.   IMPRESSION: No significant extracardiac findings within the visualized chest.     Electronically Signed   By: Richardean Sale M.D.   On: 02/07/2022 14:55    Addended by Richardean Sale, MD on 02/07/2022  2:57 PM   Study Result  Narrative & Impression  CLINICAL DATA:  81F with hypertension, hyperlipidemia, carotid stenosis, and dyspnea.   EXAM: Cardiac/Coronary  CT   TECHNIQUE: The patient was scanned on a [employer].   FINDINGS: A 120 kV prospective scan was triggered in the descending thoracic aorta at 111 HU's. Axial non-contrast 3 mm slices were carried out through the  heart. The data set was analyzed on a dedicated work station and scored using the [place]. Gantry rotation speed was 250 msecs and collimation was .6 mm. No beta blockade and 0.8 mg of sl NTG was given. The 3D data set was reconstructed in 5% intervals of the 67-82 % of the R-R cycle. Diastolic phases were analyzed on a dedicated work station using MPR, MIP and VRT modes. The patient received 80 cc of contrast.   Aorta: Normal size.  No calcifications.  No dissection.   Aortic Valve:  Trileaflet.  No calcifications.   Coronary Arteries:  Normal coronary origin.  Right dominance.   RCA is a large dominant artery that gives rise to PDA and PLVB. There is moderate(50-69%) calcified plaque in the proximal RCA and minimal (<25%) calcified plaque in the mid vessel.   Left main is a large artery  that gives rise to LAD and LCX arteries.   LAD is a large vessel that has mild (25-49%) calcified plaque proximally and minimal (<25%) soft plaque distally. D1 is a small vessel without plaque.   LCX is a non-dominant artery.  There is no plaque.   Coronary Calcium Score:   Left main: 0   Left anterior descending artery: 223   Left circumflex artery: 0   Right coronary artery: 104   Total: 326   Percentile: 72nd   Other findings:   Normal pulmonary vein drainage into the left atrium.   Normal let atrial appendage without a thrombus.   Normal size of the pulmonary artery.   Mild mitral annular calcification.   IMPRESSION: 1. Coronary calcium score of 326. This was 72nd percentile for age-, race-, and sex-matched controls.   2. Normal coronary origin with right dominance.   3. Moderate (50-69%) plaque in the RCA and mild (25-49%) plaque in the LAD. CAD-RADS 3.   4.  Will send study for FFRct.   Interpretation of the non-cardiac intrathoracic structures by Radiology is pending.   Skeet Latch, MD   Electronically Signed: By: Skeet Latch M.D. On:  02/07/2022 13:17    FFRCT ANALYSIS for abnormal coronary CT-A.   FINDINGS: FFRct analysis was performed on the original cardiac CT angiogram dataset. Diagrammatic representation of the FFRct analysis is provided in a separate PDF document in PACS. This dictation was created using the PDF document and an interactive 3D model of the results. 3D model is not available in the EMR/PACS. Normal FFR range is >0.80.   1. Left Main: FFRct 0.99   2. LAD: FFRct 0.92 proximal, 0.91 mid   3. LCX: FFRct 0.97   4. Ramus: FFRct 0.95   5. RCA: FFRct 0.94 proximal, 0.91 mid, 0.91 distal   IMPRESSION: FFRct findings are consistent with non-obstructive CAD.   Tiffany C. Oval Linsey, MD     Electronically Signed   By: Skeet Latch M.D.   On: 02/07/2022 13:22    ASSESSMENT:    1. CAD in native artery   2. Essential hypertension   3. Hyperlipidemia, unspecified hyperlipidemia type   4. Bilateral extracranial carotid artery stenosis       PLAN:  In order of problems listed above:  Chest pain: unclear etiology. Had nonobstructive CAD by CTA in 2019. Repeat CTA recently again showed nonobstructive CAD. Normal FFR evaluation. Continue to focus on risk factor modification. Patient reassured.   Bilateral carotid artery disease: s/p recent left CEA. Dopplers in Jan ok.   Hypertension: Blood pressure is quite labile. I am concerned about her low BP readings. Recommend she stop taking lasix  and potassium. If she continues to have low readings will need to stop hydralazine and use only PRN. Will follow up in 3 months.   Hyperlipidemia:on lipitor, Zetia and fish oil. Goal LDL < 70. Last LDL 27 on Repatha.  6.   Mesenteric stenosis - celiac axis and superior mesenteric arteries 70%. Followed by [facility].     Medication Adjustments/Labs and Tests Ordered: Current medicines are reviewed at length with the patient today.  Concerns regarding medicines are outlined above.  Medication changes, Labs  and Tests ordered today are listed in the Patient Instructions below. Patient Instructions  Stop taking lasix and potassium  Continue to monitor your blood pressure and let me know if it is running low.     Signed, Devun Anna [place], MD  02/21/2022 9:52 AM    [facility] [place], [place], [place]  [address] Phone: [phone]; Fax: [phone]

## 2022-02-21 ENCOUNTER — Ambulatory Visit: Payer: Medicare Other | Admitting: Cardiology

## 2022-02-21 ENCOUNTER — Encounter: Payer: Self-pay | Admitting: Cardiology

## 2022-02-21 VITALS — BP 122/70 | HR 54 | Ht 60.0 in | Wt 101.8 lb

## 2022-02-21 DIAGNOSIS — I10 Essential (primary) hypertension: Secondary | ICD-10-CM

## 2022-02-21 DIAGNOSIS — I6523 Occlusion and stenosis of bilateral carotid arteries: Secondary | ICD-10-CM | POA: Diagnosis not present

## 2022-02-21 DIAGNOSIS — I2510 Atherosclerotic heart disease of native coronary artery without angina pectoris: Secondary | ICD-10-CM | POA: Diagnosis not present

## 2022-02-21 DIAGNOSIS — E785 Hyperlipidemia, unspecified: Secondary | ICD-10-CM

## 2022-02-21 NOTE — Patient Instructions (Addendum)
Stop taking lasix and potassium  Continue to monitor your blood pressure and let me know if it is running low.

## 2022-02-22 DIAGNOSIS — J383 Other diseases of vocal cords: Secondary | ICD-10-CM | POA: Diagnosis not present

## 2022-02-22 DIAGNOSIS — H9113 Presbycusis, bilateral: Secondary | ICD-10-CM | POA: Diagnosis not present

## 2022-02-22 DIAGNOSIS — R490 Dysphonia: Secondary | ICD-10-CM | POA: Diagnosis not present

## 2022-02-22 DIAGNOSIS — J300 Vasomotor rhinitis: Secondary | ICD-10-CM | POA: Insufficient documentation

## 2022-03-19 DIAGNOSIS — F3341 Major depressive disorder, recurrent, in partial remission: Secondary | ICD-10-CM | POA: Diagnosis not present

## 2022-03-19 DIAGNOSIS — F411 Generalized anxiety disorder: Secondary | ICD-10-CM | POA: Diagnosis not present

## 2022-04-01 DIAGNOSIS — M5459 Other low back pain: Secondary | ICD-10-CM | POA: Diagnosis not present

## 2022-04-04 ENCOUNTER — Other Ambulatory Visit: Payer: Self-pay | Admitting: Neurosurgery

## 2022-04-04 DIAGNOSIS — M549 Dorsalgia, unspecified: Secondary | ICD-10-CM

## 2022-04-12 ENCOUNTER — Ambulatory Visit
Admission: RE | Admit: 2022-04-12 | Discharge: 2022-04-12 | Disposition: A | Discharge status: Home / Self Care | Payer: Medicare Other | Source: Ambulatory Visit | Attending: Neurosurgery | Admitting: Neurosurgery

## 2022-04-12 DIAGNOSIS — M5136 Other intervertebral disc degeneration, lumbar region: Secondary | ICD-10-CM | POA: Diagnosis not present

## 2022-04-12 DIAGNOSIS — M549 Dorsalgia, unspecified: Secondary | ICD-10-CM

## 2022-04-22 DIAGNOSIS — M549 Dorsalgia, unspecified: Secondary | ICD-10-CM | POA: Diagnosis not present

## 2022-04-29 NOTE — Progress Notes (Signed)
Cardiology Office Note    Date:  05/13/2022   ID:  Shannon Berger, Shannon Berger [birth year], MRN [id number]  PCP:  [name], MD  Cardiologist:  Dr. Martinique  Chief Complaint  Patient presents with   Hypertension   Coronary Artery Disease    History of Present Illness:  Shannon Berger is a 81 y.o. female is seen for follow up dyspnea. She has a  past medical history of carotid artery disease, hypertension, hyperlipidemia, ischemic optic neuropathy, and osteopenia.  She was seen in 2016 for evaluation of chest pain.  She was complaining of chest pain not related to exertion at the time.  She had a similar symptom in the past with normal stress echo in 2012 and normal ETT in 2008.  She eventually underwent a Myoview on 03/24/2015 which showed EF 68%, normal perfusion, overall low risk study.    Her carotid artery disease has been followed by Dr. [name] of vascular surgery.    She was seen by Almyra Deforest PA-C in October 2019 for symptoms of dyspnea on exertion. She had evaluation with an Echo showing normal LV systolic and valve function. Grade 2 diastolic dysfunction. Coronary CTA was done showing calcium score of 264 but nonobstructive CAD. It was felt that her scoliosis may be contributing to her dyspnea. She reported having PFTs but no results available for [place] to review.  In November 2019 she was admitted with aphasia and left sided weakness. CT and MRI showed no acute findings but carotid dopplers showed progression of carotid arterial disease with > 80% left carotid stenosis. She subsequently underwent left CEA by Dr [name] on 09/32/35 without complications. 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 This has been asymptomatic so managed conservatively.   She was seen in February 2023 by Coletta Memos NP for atypical chest pain for which she was evaluated at [facility] ED. In June she had  recurrent chest pain and after  discussion with DOD Dr Sallyanne Kuster it was decided to schedule her for coronary CTA. Unfortunately she had a coronary calcium score ordered instead which really wasn't helpful. To evaluate further we did do a coronary CTA. This again showed nonobstructive CAD.   When seen previously she was getting low BP readings to 80 systolic. Was symptomatic. We stopped her lasix and potassium. She states she quit taking hydralazine as well but resumed this a couple of weeks ago. She wasn't recording BP regularly until this past week and BP has been very high 573-220 systolic. Last night BP came down to 117/53 after taking Xanax and ASA. She denies any symptoms. No chest pain, dyspnea, dizziness or any new neurologic symptoms.   Past Medical History:  Diagnosis Date   Anginal pain ([place])    occasionally   Anxiety    Arthritis    Bladder cancer ([place])    Carotid arterial disease ([place])    Chronic kidney disease    Complication of anesthesia    "hallucinates for days; have a hard time waking up; almost couldn't get her awake last time; wake up wild" (05/26/2018)   Constipation    Degeneration of lumbar intervertebral disc    Dislocation of hip joint prosthesis ([place])    Essential tremor    Fusion of spine of lumbar region    Heart murmur    "slight one" (05/26/2018)   Hyperlipidemia    Hypertension    Low back  pain radiating to left lower extremity    Melanoma ([place])    "center of upper chest"   Osteoarthritis    Osteopenia    Sleep difficulties    Spinal stenosis at L4-L5 level    Stroke [facility])    tia--last week   TIA (transient ischemic attack) 05/20/2018    Past Surgical History:  Procedure Laterality Date   ANTERIOR LUMBAR FUSION     added rods and screws;   BACK SURGERY     BREAST BIOPSY Left    "not cancer"   BUNIONECTOMY Left    CAROTID ENDARTERECTOMY Left 05/26/2018   CATARACT EXTRACTION W/ INTRAOCULAR LENS  IMPLANT, BILATERAL Bilateral    ENDARTERECTOMY  Left 05/26/2018   Procedure: ENDARTERECTOMY CAROTID LEFT;  Surgeon: Angelia Mould, MD;  Location: [place];  Service: Vascular;  Laterality: Left;   EYE SURGERY Right    "retina wrinkle OR; now can't see out of that eye" (05/26/2018)   JOINT REPLACEMENT Left 2014   Hip   LUMBAR SPINE HARDWARE REMOVAL     "went in thru the front"   MELANOMA EXCISION     "center of upper chest"   PATCH ANGIOPLASTY Left 05/26/2018   Procedure: PATCH ANGIOPLASTY LEFT CAROTID ARTERY;  Surgeon: Angelia Mould, MD;  Location: [place];  Service: Vascular;  Laterality: Left;   PILONIDAL CYST DRAINAGE     POSTERIOR LUMBAR FUSION  X 2   TENDON REPAIR Left    Reattachment of tendon- Left hip   THUMB FUSION Right    TONSILLECTOMY AND ADENOIDECTOMY     TOTAL HIP ARTHROPLASTY Left 09/04/2012   Procedure: LEFT TOTAL HIP ARTHROPLASTY ANTERIOR APPROACH;  Surgeon: Mcarthur Rossetti, MD;  Location: [facility];  Service: Orthopedics;  Laterality: Left;  Left Total Hip Arthroplasty, Anterior Approach   TRANSURETHRAL RESECTION OF BLADDER TUMOR N/A 06/06/2020   Procedure: TRANSURETHRAL RESECTION OF BLADDER TUMOR(TURBT) WITH CYSTOSCOPY/ INSTILLATION OF GEMCITABINE WITH RIGHT STENT PLACEMENT;  Surgeon: Ceasar Mons, MD;  Location: [facility];  Service: Urology;  Laterality: N/A;    Current Medications: Outpatient Medications Prior to Visit  Medication Sig Dispense Refill   acetaminophen (TYLENOL) 500 MG tablet Take 500 mg by mouth every 6 (six) hours as needed for moderate pain or headache.     ALPRAZolam (XANAX) 0.5 MG tablet Take 0.25 mg by mouth at bedtime as needed for sleep.      Ascorbic Acid (VITAMIN C) 1000 MG tablet Take 1,000 mg by mouth 2 (two) times daily.     aspirin EC 81 MG tablet Take 1 tablet (81 mg total) by mouth daily. Swallow whole. 30 tablet 12   atorvastatin (LIPITOR) 40 MG tablet Take 40 mg by mouth daily.  3   bisoprolol (ZEBETA) 10 MG tablet Take 10 mg by mouth daily.     Calcium  Carbonate-Vit D-Min (CALCIUM 1200 PO) Take 1 tablet by mouth daily.     Cholecalciferol (VITAMIN D) 2000 units tablet Take 2,000 Units by mouth daily.      denosumab (PROLIA) 60 MG/ML SOSY injection Inject 60 mg into the skin every 6 (six) months.     desvenlafaxine (PRISTIQ) 100 MG 24 hr tablet Take 100 mg by mouth daily.     ezetimibe (ZETIA) 10 MG tablet Take 10 mg by mouth daily.     hydrALAZINE (APRESOLINE) 25 MG tablet Take 25 mg by mouth 2 (two) times daily.     lamoTRIgine (LAMICTAL) 25 MG tablet Take 25-75 mg by mouth  See admin instructions. Currently taking '75mg'$  at HS     Multiple Vitamin (MULTIVITAMIN) tablet Take 1 tablet by mouth daily.     olmesartan (BENICAR) 40 MG tablet Take 40 mg by mouth daily.     Omega-3 Fatty Acids (FISH OIL PO) Take 1,400 mg by mouth 2 (two) times daily.     OMEPRAZOLE PO Take by mouth.     Probiotic Product (ALIGN PO) Take by mouth.     REPATHA SURECLICK 106 MG/ML SOAJ Inject 1 Syringe into the skin every 14 (fourteen) days.     zinc gluconate 50 MG tablet Take 50 mg by mouth daily.     metoprolol tartrate (LOPRESSOR) 25 MG tablet Take 25 mg 2 hours before Coronary CT 1 tablet 0   ciclopirox (PENLAC) 8 % solution Apply topically at bedtime. Apply over nail and surrounding skin. Apply daily over previous coat. After seven (7) days, may remove with alcohol and continue cycle. (Patient not taking: Reported on 05/13/2022) 6.6 mL 0   Facility-Administered Medications Prior to Visit  Medication Dose Route Frequency Provider Last Rate Last Admin   gemcitabine (GEMZAR) chemo syringe for bladder instillation 2,000 mg  2,000 mg Bladder Instillation Once Winter, Conception Oms, MD         Allergies:   Oxycodone-acetaminophen, Versed [midazolam], and Hydrocodone-acetaminophen   Social History   Socioeconomic History   Marital status: Married    Spouse name: jerry   Number of children: 2   Years of education: Not on file   Highest education level:  Bachelor's degree (e.g., BA, AB, BS)  Occupational History   Occupation: Artist: RETIRED    Comment: retired  Tobacco Use   Smoking status: Never   Smokeless tobacco: Never  Vaping Use   Vaping Use: Never used  Substance and Sexual Activity   Alcohol use: Yes    Alcohol/week: 1.0 standard drink of alcohol    Types: 1 Glasses of wine per week    Comment: Occas.   Drug use: Never   Sexual activity: Not Currently    Partners: Male    Comment: 1st intercourse- 81, partners- 61, married- 63 yrs   Other Topics Concern   Not on file  Social History Narrative   Pt lives at home with spouse jerry, 2 story home has 2 children   She is right handed, she drinks ginger ale and come tea, no coffee, or soda   Social Determinants of Radio broadcast assistant Strain: Not on file  Food Insecurity: Not on file  Transportation Needs: Not on file  Physical Activity: Not on file  Stress: Not on file  Social Connections: Not on file     Family History:  The patient's family history includes Cancer in her brother and mother; Diabetes in her father; Healthy in her son; Heart attack in her father; Heart disease in her father; Heart failure in her mother; Hypertension in her father and mother; Migraines in her daughter; Other in her mother.   ROS:   Please see the history of present illness.    ROS All other systems reviewed and are negative.   PHYSICAL EXAM:   VS:  BP (!) 178/78 (BP Location: Left Arm, Patient Position: Sitting, Cuff Size: Normal)   Pulse (!) 58   Ht '5\' 3"'$  (1.6 m)   Wt 110 lb 3.2 oz (50 kg)   SpO2 99%   BMI 19.52 kg/m    GEN: Well nourished, well developed,  in no acute distress  HEENT: normal  Neck: no JVD, carotid bruits, or masses Cardiac: RRR; no murmurs, rubs, or gallops,no edema  Respiratory:  clear to auscultation bilaterally, normal work of breathing GI: soft, nontender, nondistended, + BS MS: no deformity or atrophy  Skin: warm and dry,  no rash Neuro:  Alert and Oriented x 3, Strength and sensation are intact Psych: euthymic mood, full affect  Wt Readings from Last 3 Encounters:  05/13/22 110 lb 3.2 oz (50 kg)  02/21/22 101 lb 12.8 oz (46.2 kg)  01/21/22 112 lb 6.4 oz (51 kg)      Studies/Labs Reviewed:    Recent Labs: 01/21/2022: BUN 26; Creatinine, Ser 1.31; Potassium 5.3; Sodium 143   Lipid Panel    Component Value Date/Time   CHOL 174 05/21/2018 0412   TRIG 59 05/21/2018 0412   HDL 71 05/21/2018 0412   CHOLHDL 2.5 05/21/2018 0412   VLDL 12 05/21/2018 0412   LDLCALC 91 05/21/2018 0412   Dated 04/20/21: creatinine 1.43. otherwise CMET and CBC normal. Dated 06/15/21: cholesterol 110, triglycerides 41, HDL 75, LDL 27.    Additional studies/ records that were reviewed today include:   Myoview 03/24/2015 Study Highlights   The left ventricular ejection fraction is hyperdynamic (>65%). Nuclear stress EF: 68%. There was no ST segment deviation noted during stress. The study is normal. This is a low risk study. Normal myocardial perfusion and function   Low risk lexiscan nuclear stress test demonstrating normal myocardial perfusion and function; EF 68%,   Echo 05/21/18: Study Conclusions   - Left ventricle: The cavity size was normal. Wall thickness was   normal. Systolic function was normal. The estimated ejection   fraction was in the range of 60% to 65%. Wall motion was normal;   there were no regional wall motion abnormalities. Features are   consistent with a pseudonormal left ventricular filling pattern,   with concomitant abnormal relaxation and increased filling   pressure (grade 2 diastolic dysfunction). - Mitral valve: There was mild regurgitation. - Pulmonary arteries: Systolic pressure was mildly increased. PA   peak pressure: 34 mm Hg (S).  ADDENDUM REPORT: 06/01/2018 22:20   CLINICAL DATA:  Chest pain   EXAM: Cardiac CTA   MEDICATIONS: Sub lingual nitro. '4mg'$  x 2    TECHNIQUE: The patient was scanned on a Siemens 833 slice scanner. Gantry rotation speed was 250 msecs. Collimation was 0.6 mm. A 100 kV prospective scan was triggered in the ascending thoracic aorta at 35-75% of the R-R interval. Average HR during the scan was 60 bpm. The 3D data set was interpreted on a dedicated work station using MPR, MIP and VRT modes. A total of 80cc of contrast was used.   FINDINGS: Non-cardiac: See separate report from [facility].   The pulmonary veins drained normally to the left atrium.   Calcium Score: 260 Agatston units.   Coronary Arteries: Right dominant with no anomalies   LM: No plaque or stenosis.   LAD system: Calcified plaque proximal LAD with mild (<50%) stenosis.   Circumflex system: Moderate ramus with no significant disease. The AV LCx was relatively small with no plaque or stenosis.   RCA system: Calcified plaque with mild stenosis (<50%) in the proximal RCA.   IMPRESSION: 1. Coronary artery calcium score 260 Agatston units. This places the patient in the 73rd percentile for age and gender, suggesting intermediate risk for future cardiac events.   2.  Nonobstructive CAD.   [place] [employer]  OVER-READ INTERPRETATION  CT CHEST   The following report is an over-read performed by radiologist Dr. Rolm Baptise of [facility] on 06/01/2018. This over-read does not include interpretation of cardiac or coronary anatomy or pathology. The coronary CTA interpretation by the cardiologist is attached.   COMPARISON:  Chest CT 12/26/2015   FINDINGS: Vascular: Tortuous aorta.  No aneurysm.  Heart is normal size.   Mediastinum/Nodes: No adenopathy in the lower mediastinum or hila.   Lungs/Pleura: No confluent opacities or effusions.   Upper Abdomen: Small low-density lesion in the right hepatic lobe is stable most compatible with cyst. No acute findings   Musculoskeletal: Chest wall soft tissues are unremarkable. No  acute bony abnormality.   IMPRESSION: No acute extra cardiac abnormality.   Tortuous aorta.   Electronically Signed: By: Rolm Baptise M.D. On: 06/01/2018 11:45  Coronary CTA 02/07/22:  EXAM: OVER-READ INTERPRETATION  CT CHEST   The following report is a limited chest CT over-read performed by radiologist Dr. Lindaann Slough [facility] on 02/07/2022. This over-read does not include interpretation of cardiac or coronary anatomy or pathology. The coronary CTA interpretation by the cardiologist is attached.   COMPARISON:  Chest CTA 02/06/2021   FINDINGS: Mediastinum/Nodes: No enlarged lymph nodes within the visualized mediastinum.   Lungs/Pleura: There is no pleural effusion. Stable mild bronchiectasis and scarring, primarily within the right middle lobe and lingula. No airspace disease or suspicious pulmonary nodule.   Upper abdomen: The visualized upper abdomen appears stable without significant findings. Probable cyst in the dome of the right hepatic lobe appears unchanged.   Musculoskeletal/Chest wall: No chest wall mass or suspicious osseous findings within the visualized chest. Stable chronic compression deformities at T11 and T12.   IMPRESSION: No significant extracardiac findings within the visualized chest.     Electronically Signed   By: Richardean Sale M.D.   On: 02/07/2022 14:55    Addended by Richardean Sale, MD on 02/07/2022  2:57 PM   Study Result  Narrative & Impression  CLINICAL DATA:  62F with hypertension, hyperlipidemia, carotid stenosis, and dyspnea.   EXAM: Cardiac/Coronary  CT   TECHNIQUE: The patient was scanned on a [employer].   FINDINGS: A 120 kV prospective scan was triggered in the descending thoracic aorta at 111 HU's. Axial non-contrast 3 mm slices were carried out through the heart. The data set was analyzed on a dedicated work station and scored using the [place]. Gantry rotation speed was 250  msecs and collimation was .6 mm. No beta blockade and 0.8 mg of sl NTG was given. The 3D data set was reconstructed in 5% intervals of the 67-82 % of the R-R cycle. Diastolic phases were analyzed on a dedicated work station using MPR, MIP and VRT modes. The patient received 80 cc of contrast.   Aorta: Normal size.  No calcifications.  No dissection.   Aortic Valve:  Trileaflet.  No calcifications.   Coronary Arteries:  Normal coronary origin.  Right dominance.   RCA is a large dominant artery that gives rise to PDA and PLVB. There is moderate(50-69%) calcified plaque in the proximal RCA and minimal (<25%) calcified plaque in the mid vessel.   Left main is a large artery that gives rise to LAD and LCX arteries.   LAD is a large vessel that has mild (25-49%) calcified plaque proximally and minimal (<25%) soft plaque distally. D1 is a small vessel without plaque.   LCX is a non-dominant artery.  There  is no plaque.   Coronary Calcium Score:   Left main: 0   Left anterior descending artery: 223   Left circumflex artery: 0   Right coronary artery: 104   Total: 326   Percentile: 72nd   Other findings:   Normal pulmonary vein drainage into the left atrium.   Normal let atrial appendage without a thrombus.   Normal size of the pulmonary artery.   Mild mitral annular calcification.   IMPRESSION: 1. Coronary calcium score of 326. This was 72nd percentile for age-, race-, and sex-matched controls.   2. Normal coronary origin with right dominance.   3. Moderate (50-69%) plaque in the RCA and mild (25-49%) plaque in the LAD. CAD-RADS 3.   4.  Will send study for FFRct.   Interpretation of the non-cardiac intrathoracic structures by Radiology is pending.   Skeet Latch, MD   Electronically Signed: By: Skeet Latch M.D. On: 02/07/2022 13:17    FFRCT ANALYSIS for abnormal coronary CT-A.   FINDINGS: FFRct analysis was performed on the original cardiac CT  angiogram dataset. Diagrammatic representation of the FFRct analysis is provided in a separate PDF document in PACS. This dictation was created using the PDF document and an interactive 3D model of the results. 3D model is not available in the EMR/PACS. Normal FFR range is >0.80.   1. Left Main: FFRct 0.99   2. LAD: FFRct 0.92 proximal, 0.91 mid   3. LCX: FFRct 0.97   4. Ramus: FFRct 0.95   5. RCA: FFRct 0.94 proximal, 0.91 mid, 0.91 distal   IMPRESSION: FFRct findings are consistent with non-obstructive CAD.   Tiffany C. Oval Linsey, MD     Electronically Signed   By: Skeet Latch M.D.   On: 02/07/2022 13:22    ASSESSMENT:    1. CAD in native artery   2. Essential hypertension   3. Hyperlipidemia, unspecified hyperlipidemia type   4. Bilateral extracranial carotid artery stenosis       PLAN:  In order of problems listed above:  CAD.  Had nonobstructive CAD by CTA in 2019. Repeat CTA recently again showed nonobstructive CAD. Normal FFR evaluation. Continue to focus on risk factor modification. Patient reassured.   Bilateral carotid artery disease: s/p recent left CEA. Dopplers in Jan ok.   Hypertension: Blood pressure has been labile.Now with persistent quite high readings.  Will continue current bisoprolol, omesartan and hydralazine 25 mg bid. Add chlorthalidone 12.5 mg daily. Will check BMET in 2 weeks. Arrange follow up in HTN clinic in 3-4 weeks. Will repleat renal duplex- last in 2021.   Hyperlipidemia:on lipitor, Zetia and fish oil. Goal LDL < 70. Last LDL 27 on Repatha.  6.   Mesenteric stenosis - celiac axis and superior mesenteric arteries 70%. Followed by [facility].     Medication Adjustments/Labs and Tests Ordered: Current medicines are reviewed at length with the patient today.  Concerns regarding medicines are outlined above.  Medication changes, Labs and Tests ordered today are listed in the Patient Instructions below. Patient Instructions  Add  chlorthalidone 12.5 mg daily to your current meds.   We will repeat a renal doppler study  Check BMET in 2 weeks      Signed, Claudia Alvizo Martinique, MD  05/13/2022 9:59 AM    [facility] [address], [place], [place]  [address] Phone: [phone]; Fax: [phone]

## 2022-05-10 DIAGNOSIS — R922 Inconclusive mammogram: Secondary | ICD-10-CM | POA: Diagnosis not present

## 2022-05-10 DIAGNOSIS — R921 Mammographic calcification found on diagnostic imaging of breast: Secondary | ICD-10-CM | POA: Diagnosis not present

## 2022-05-13 ENCOUNTER — Encounter: Payer: Self-pay | Admitting: Cardiology

## 2022-05-13 ENCOUNTER — Encounter: Payer: Self-pay | Admitting: Obstetrics & Gynecology

## 2022-05-13 ENCOUNTER — Ambulatory Visit: Payer: Medicare Other | Attending: Cardiology | Admitting: Cardiology

## 2022-05-13 VITALS — BP 178/78 | HR 58 | Ht 63.0 in | Wt 110.2 lb

## 2022-05-13 DIAGNOSIS — I6523 Occlusion and stenosis of bilateral carotid arteries: Secondary | ICD-10-CM | POA: Diagnosis not present

## 2022-05-13 DIAGNOSIS — I10 Essential (primary) hypertension: Secondary | ICD-10-CM

## 2022-05-13 DIAGNOSIS — I2510 Atherosclerotic heart disease of native coronary artery without angina pectoris: Secondary | ICD-10-CM

## 2022-05-13 DIAGNOSIS — E785 Hyperlipidemia, unspecified: Secondary | ICD-10-CM

## 2022-05-13 MED ORDER — CHLORTHALIDONE 25 MG PO TABS: 12.5000 mg | ORAL_TABLET | Freq: Every day | ORAL | 3 refills | Status: DC

## 2022-05-13 NOTE — Patient Instructions (Addendum)
Add chlorthalidone 12.5 mg daily to your current meds.   We will repeat a renal doppler study  Check BMET in 2 weeks   Schedule appointment with Pharm D in 3 weeks   Schedule appointment with Dr.Jordan in 4 months

## 2022-05-13 NOTE — Addendum Note (Signed)
Addended by: Kathyrn Lass on: 05/13/2022 10:04 AM   Modules accepted: Orders

## 2022-05-27 ENCOUNTER — Encounter (HOSPITAL_COMMUNITY): Payer: Medicare Other

## 2022-05-27 ENCOUNTER — Other Ambulatory Visit (HOSPITAL_COMMUNITY): Payer: Self-pay | Admitting: *Deleted

## 2022-05-28 DIAGNOSIS — I6523 Occlusion and stenosis of bilateral carotid arteries: Secondary | ICD-10-CM | POA: Diagnosis not present

## 2022-05-28 DIAGNOSIS — I2510 Atherosclerotic heart disease of native coronary artery without angina pectoris: Secondary | ICD-10-CM | POA: Diagnosis not present

## 2022-05-28 DIAGNOSIS — E785 Hyperlipidemia, unspecified: Secondary | ICD-10-CM | POA: Diagnosis not present

## 2022-05-28 DIAGNOSIS — I10 Essential (primary) hypertension: Secondary | ICD-10-CM | POA: Diagnosis not present

## 2022-05-29 ENCOUNTER — Other Ambulatory Visit: Payer: Self-pay

## 2022-05-29 ENCOUNTER — Ambulatory Visit (HOSPITAL_COMMUNITY)
Admission: RE | Admit: 2022-05-29 | Discharge: 2022-05-29 | Disposition: A | Discharge status: Home / Self Care | Payer: Medicare Other | Source: Ambulatory Visit | Attending: Internal Medicine | Admitting: Internal Medicine

## 2022-05-29 DIAGNOSIS — M810 Age-related osteoporosis without current pathological fracture: Secondary | ICD-10-CM | POA: Diagnosis not present

## 2022-05-29 LAB — BASIC METABOLIC PANEL
BUN/Creatinine Ratio: 28 (ref 12–28)
BUN: 43 mg/dL — ABNORMAL HIGH (ref 8–27)
CO2: 22 mmol/L (ref 20–29)
Calcium: 10.5 mg/dL — ABNORMAL HIGH (ref 8.7–10.3)
Chloride: 99 mmol/L (ref 96–106)
Creatinine, Ser: 1.52 mg/dL — ABNORMAL HIGH (ref 0.57–1.00)
Glucose: 92 mg/dL (ref 70–99)
Potassium: 4.6 mmol/L (ref 3.5–5.2)
Sodium: 142 mmol/L (ref 134–144)
eGFR: 34 mL/min/{1.73_m2} — ABNORMAL LOW (ref >59)

## 2022-05-29 MED ORDER — DENOSUMAB 60 MG/ML ~~LOC~~ SOSY
PREFILLED_SYRINGE | SUBCUTANEOUS | Status: AC
  Filled 2022-05-29: qty 1

## 2022-05-29 MED ORDER — DENOSUMAB 60 MG/ML ~~LOC~~ SOSY
60.0000 mg | PREFILLED_SYRINGE | Freq: Once | SUBCUTANEOUS | Status: AC
  Administered 2022-05-29: 60 mg via SUBCUTANEOUS

## 2022-06-03 ENCOUNTER — Telehealth: Payer: Self-pay | Admitting: Cardiology

## 2022-06-03 NOTE — Telephone Encounter (Signed)
Reviewed up coming appointments with patient as she has a Renal Doppler schedule for tomorrow, 11/28. She also had questions about her medications that had been changed and making sure she had the correct does and follow up appointments. Reviewed Dr. Shellia Cleverly orders from 11/22 -  -Chlorthalidone 25 mg table - STOP and  - HydrAlazine '25mg'$  INCREASED to 50 mg TID. She wil to finish her 25 mg tablets (taking 2 tables 3 times a day) until she gets her next refills which will be a 50 mg tablet and she knows to only take one tablet TID.   She has an appointment with PharmD on 12/8 at 10:30 and that is to review how she and her BP are doing with these changes.   Patient agreed with plan, and no additional questions at this time.

## 2022-06-03 NOTE — Telephone Encounter (Signed)
Patient is requesting to speak directly with Malachy Mood, LPN regarding renal testing scheduled for tomorrow, 11/28. She states she has several questions for her.

## 2022-06-04 ENCOUNTER — Ambulatory Visit (HOSPITAL_COMMUNITY)
Admission: RE | Admit: 2022-06-04 | Discharge: 2022-06-04 | Disposition: A | Discharge status: Home / Self Care | Payer: Medicare Other | Source: Ambulatory Visit | Attending: Cardiology | Admitting: Cardiology

## 2022-06-04 ENCOUNTER — Other Ambulatory Visit: Payer: Self-pay | Admitting: Cardiology

## 2022-06-04 DIAGNOSIS — I10 Essential (primary) hypertension: Secondary | ICD-10-CM

## 2022-06-04 DIAGNOSIS — I701 Atherosclerosis of renal artery: Secondary | ICD-10-CM | POA: Diagnosis not present

## 2022-06-04 DIAGNOSIS — I2510 Atherosclerotic heart disease of native coronary artery without angina pectoris: Secondary | ICD-10-CM

## 2022-06-04 DIAGNOSIS — I6523 Occlusion and stenosis of bilateral carotid arteries: Secondary | ICD-10-CM

## 2022-06-04 DIAGNOSIS — E785 Hyperlipidemia, unspecified: Secondary | ICD-10-CM

## 2022-06-06 DIAGNOSIS — H01004 Unspecified blepharitis left upper eyelid: Secondary | ICD-10-CM | POA: Diagnosis not present

## 2022-06-13 ENCOUNTER — Telehealth: Payer: Self-pay | Admitting: Cardiology

## 2022-06-13 NOTE — Telephone Encounter (Signed)
Spoke with patient of Dr. Martinique Some of the BP readings reported may be recorded before taking AM meds Advised changes will likely be made tomorrow and not today  She is now taking hydralazine '50mg'$  TID and benicar '40mg'$  QHS  Advised to bring home BP readings to appointment and BP cuff to appointment tomorrow with PharmD

## 2022-06-13 NOTE — Telephone Encounter (Signed)
Pt c/o BP issue: STAT if pt c/o blurred vision, one-sided weakness or slurred speech  1. What are your last 5 BP readings?  12/7 178/64 56 12/6 221/80 61 12/6 160/65 62 12/5 198/59 61 12/4 202/70 59 12/3  170/75 62   2. Are you having any other symptoms (ex. Dizziness, headache, blurred vision, passed out)? No   3. What is your BP issue? Patient is concerned about her high blood pressure.  She states she is coming in tomorrow to see Pharm D about her BP, but since the appt is on a Friday she wanted to call in today to make [place] aware of her BP readings just in case any change of medication need to be made.

## 2022-06-14 ENCOUNTER — Encounter: Payer: Self-pay | Admitting: Pharmacist

## 2022-06-14 ENCOUNTER — Ambulatory Visit: Payer: Medicare Other | Attending: Internal Medicine | Admitting: Pharmacist

## 2022-06-14 ENCOUNTER — Ambulatory Visit: Payer: Medicare Other | Admitting: Obstetrics & Gynecology

## 2022-06-14 VITALS — BP 142/68 | HR 55 | Wt 107.8 lb

## 2022-06-14 DIAGNOSIS — Z981 Arthrodesis status: Secondary | ICD-10-CM | POA: Insufficient documentation

## 2022-06-14 DIAGNOSIS — M40204 Unspecified kyphosis, thoracic region: Secondary | ICD-10-CM | POA: Insufficient documentation

## 2022-06-14 DIAGNOSIS — I10 Essential (primary) hypertension: Secondary | ICD-10-CM

## 2022-06-14 DIAGNOSIS — Z0289 Encounter for other administrative examinations: Secondary | ICD-10-CM

## 2022-06-14 DIAGNOSIS — M810 Age-related osteoporosis without current pathological fracture: Secondary | ICD-10-CM | POA: Insufficient documentation

## 2022-06-14 LAB — BASIC METABOLIC PANEL
BUN/Creatinine Ratio: 28 (ref 12–28)
BUN: 34 mg/dL — ABNORMAL HIGH (ref 8–27)
CO2: 25 mmol/L (ref 20–29)
Calcium: 10.6 mg/dL — ABNORMAL HIGH (ref 8.7–10.3)
Chloride: 104 mmol/L (ref 96–106)
Creatinine, Ser: 1.22 mg/dL — ABNORMAL HIGH (ref 0.57–1.00)
Glucose: 97 mg/dL (ref 70–99)
Potassium: 5.3 mmol/L — ABNORMAL HIGH (ref 3.5–5.2)
Sodium: 142 mmol/L (ref 134–144)
eGFR: 45 mL/min/{1.73_m2} — ABNORMAL LOW (ref >59)

## 2022-06-14 MED ORDER — HYDRALAZINE HCL 50 MG PO TABS: 50.0000 mg | ORAL_TABLET | Freq: Three times a day (TID) | ORAL | 1 refills | Status: DC

## 2022-06-14 NOTE — Progress Notes (Signed)
Patient ID: Shannon Berger                 DOB: [birth year]                      MRN: [id number]     HPI: Shannon Berger is a 81 y.o. female referred by Dr. Martinique to HTN clinic. PMH is significant for TIA, carotid artery stenosis, OSA,  HTN, CKD, and multiple back surgeries with complications.  Patient presents today with husband. Has been checking blood pressure twice daily and readings have been consistently elevated.  Recent readings: 169/64 193/74 159/63 171/58 178/64  Husband and patient both note patient has anxiety and worry. Also has chrnoic pain from back surgeries.  Denies SOB, chest pain or dizziness. Has had no adverse effects to increase in hydralazine. Per records, had previously taken amlodipine but does not know why it was d/c.   Chlorthalidone recently discontinued due to increase in Scr and eGFR decrease.  Brought home blood pressure cuff. Lumscope home monitor. Had patient check in room: 165/62  Takes olmesartan and bisoprolol in the evening.  Current HTN meds:  Bisoprolol 36m daily Hydralazine 543mTID Olmesartan 4030maily  BP goal: <140/90  Wt Readings from Last 3 Encounters:  06/14/22 107 lb 12.8 oz (48.9 kg)  05/13/22 110 lb 3.2 oz (50 kg)  02/21/22 101 lb 12.8 oz (46.2 kg)   BP Readings from Last 3 Encounters:  06/14/22 (!) 142/68  05/29/22 (!) 158/60  05/13/22 (!) 178/78   Pulse Readings from Last 3 Encounters:  06/14/22 (!) 55  05/29/22 (!) 56  05/13/22 (!) 58    Renal function: Estimated Creatinine Clearance: 22.4 mL/min (A) (by C-G formula based on SCr of 1.52 mg/dL (H)).  Past Medical History:  Diagnosis Date   [place] ([place]  occasionally   Anxiety    Arthritis    Bladder cancer ([place]  Carotid arterial disease ([facility]  Chronic kidney disease    Complication of anesthesia    "hallucinates for days; have a hard time waking up; almost couldn't get her awake last time; wake up wild" (05/26/2018)    Constipation    Degeneration of lumbar intervertebral disc    Dis[place] of [place] joint prosthesis ([place]  Essential tremor    Fusion of spine of lumbar region    Heart murmur    "slight one" (05/26/2018)   Hyperlipidemia    Hypertension    Low back pain radiating to left lower extremity    Melanoma ([place]  "center of upper chest"   Osteoarthritis    Osteopenia    Sleep difficulties    Spinal stenosis at L4-L5 level    [facility]  [facility]last [facility]   TIA (transient ischemic attack) 05/20/2018    Current Outpatient Medications on File Prior to Visit  Medication Sig Dispense Refill   acetaminophen (TYLENOL) 500 MG tablet Take 500 mg by mouth every 6 (six) hours as needed for moderate pain or headache.     ALPRAZolam (XANAX) 0.5 MG tablet Take 0.25 mg by mouth at bedtime as needed for sleep.      Ascorbic Acid (VITAMIN C) 1000 MG tablet Take 1,000 mg by mouth 2 (two) times daily.     aspirin EC 81 MG tablet Take 1 tablet (81 mg total) by mouth daily. Swallow whole. 30 tablet 12   atorvastatin (LIPITOR) 40 MG tablet Take  40 mg by mouth daily.  3   bisoprolol (ZEBETA) 10 MG tablet Take 10 mg by mouth daily.     Calcium Carbonate-Vit D-Min (CALCIUM 1200 PO) Take 1 tablet by mouth daily.     Cholecalciferol (VITAMIN D) 2000 units tablet Take 2,000 Units by mouth daily.      denosumab (PROLIA) 60 MG/ML SOSY injection Inject 60 mg into the skin every 6 (six) months.     desvenlafaxine (PRISTIQ) 100 MG 24 hr tablet Take 100 mg by mouth daily.     ezetimibe (ZETIA) 10 MG tablet Take 10 mg by mouth daily.     lamoTRIgine (LAMICTAL) 25 MG tablet Take 25-75 mg by mouth See admin instructions. Currently taking 85m at HS     Multiple Vitamin (MULTIVITAMIN) tablet Take 1 tablet by mouth daily.     olmesartan (BENICAR) 40 MG tablet Take 40 mg by mouth daily.     Omega-3 Fatty Acids (FISH OIL PO) Take 1,400 mg by mouth 2 (two) times daily.     OMEPRAZOLE PO Take by mouth.     Probiotic Product  (ALIGN PO) Take by mouth.     REPATHA SURECLICK 1159MG/ML SOAJ Inject 1 Syringe into the skin every 14 (fourteen) days.     zinc gluconate 50 MG tablet Take 50 mg by mouth daily.     Current Facility-Administered Medications on File Prior to Visit  Medication Dose Route Frequency Provider Last Rate Last Admin   gemcitabine (GEMZAR) chemo syringe for bladder instillation 2,000 mg  2,000 mg Bladder Instillation Once Winter, CConception Oms MD        Allergies  Allergen Reactions   Oxycodone-Acetaminophen     hallucinations   Versed [Midazolam] Other (See Comments)    Had trouble waking up    Hydrocodone-Acetaminophen     hallucination      Assessment/Plan:  1. Hypertension -  Patient BP in room 135/55 on machine and 142/68 when checked manually.  Both significantly less than her home reading despite proper technique. Recommend she purchase an Omron blood pressure cuff and begin checking BP at home after resting for at least 5 minutes and keeping feet flat on floor.  No medication changes needed at this time. Will await home readings before making adjustments. Consider switching olmesartan to AM or addition of amlodipine if BP remains elevated at home.  Patient voiced understanding.  Will recheck BMP today since discontinuing chlorthalidone.  HYPERTENSION CONTROL Vitals:   06/14/22 1025 06/14/22 1038  BP: (!) 135/55 (!) 142/68    The patient's blood pressure is elevated above target today.  In order to address the patient's elevated BP: Blood pressure will be monitored at home to determine if medication changes need to be made.    CKarren Cobble PharmD, BCACP, CGrand Cote[place] CDelta [place] S[place] City[place] [address]Phone: [phone] Fax: [phone]

## 2022-06-14 NOTE — Patient Instructions (Addendum)
It was nice meeting you two today  We would like your blood pressure to stay less than 140/90  Please continue your bisoprolol, hydralazine, and olmesartan at this time  The blood pressure cuffs we recommend are made by Omron.  I recommend the Series 3 or higher  Please begin checking your blood pressure at home after resting for at least 5 minutes with your feet flat on the floor  Please call or send over readings via [place], PharmD, [place], [place], [place], [place], [place], [address] Phone: [phone], Fax: [phone]

## 2022-06-17 ENCOUNTER — Telehealth: Payer: Self-pay | Admitting: Cardiology

## 2022-06-17 NOTE — Telephone Encounter (Signed)
Patient is requesting to speak directly with another pharmacist if Gerald Stabs is unavailable. She states it is regarding BP discrepancies and 12/08 PharmD appointment.

## 2022-06-18 ENCOUNTER — Telehealth: Payer: Self-pay | Admitting: Cardiology

## 2022-06-18 ENCOUNTER — Ambulatory Visit (INDEPENDENT_AMBULATORY_CARE_PROVIDER_SITE_OTHER): Payer: Medicare Other | Admitting: Obstetrics & Gynecology

## 2022-06-18 ENCOUNTER — Encounter: Payer: Self-pay | Admitting: Obstetrics & Gynecology

## 2022-06-18 VITALS — BP 154/93 | HR 70 | Resp 18 | Ht 59.75 in | Wt 106.0 lb

## 2022-06-18 DIAGNOSIS — Z01419 Encounter for gynecological examination (general) (routine) without abnormal findings: Secondary | ICD-10-CM

## 2022-06-18 DIAGNOSIS — Z780 Asymptomatic menopausal state: Secondary | ICD-10-CM

## 2022-06-18 DIAGNOSIS — M810 Age-related osteoporosis without current pathological fracture: Secondary | ICD-10-CM

## 2022-06-18 DIAGNOSIS — C679 Malignant neoplasm of bladder, unspecified: Secondary | ICD-10-CM

## 2022-06-18 NOTE — Telephone Encounter (Signed)
Pt c/o medication issue:  1. Name of Medication:   hydrALAZINE (APRESOLINE) 50 MG tablet   2. How are you currently taking this medication (dosage and times per day)?  As prescribed  3. Are you having a reaction (difficulty breathing--STAT)?   No  4. What is your medication issue?   Patient stated she is having a reaction to this medication and her BP is still running high. Patient request RN Malachy Mood return her.

## 2022-06-18 NOTE — Telephone Encounter (Signed)
Spoke to patient she stated her B/P continues to be elevated.Stated yesterday she felt off balance.She continues to feel off balance today.B/P has been 165/81,192/91,165/77,175/80,193/83,183/85. Pulse ranging 50 to 60's.Stated she recently saw our Pharm D and no medication changes were made.She is suppose to send in her B/P readings.Stated she feels like Hydralazine is causing her to be off balance.Her B/P continues to be elevated even on Hydralazine.Stated she stopped taking Hydralazine this morning.Advised I will send message to [place] for advice.

## 2022-06-18 NOTE — Progress Notes (Signed)
Shannon Berger 02-07-1941 [id number]   History:    81 y.o.  G2P2L2 Married   RP:  Established patient presenting for annual gyn exam    HPI: Postmenopause, well on no HRT.  No PMB.  No pelvic pain. Abstinent.  Pap 03/2017 Neg.  No indication to repeat a Pap test at this time. SUI stable, wears a pad. Neuro for back pain.  Severe scoliosis. Breasts wnl. Mammo 05/2022 Benign.  BMI 20.88.  Early bladder cancer excised in 2021, followed by Urology.  Renal artery stenosis, followed by a CV surgeon. Health labs with Fam MD.  Osteoporosis BD 01/2021 on Prolia, followed by Dr Joylene Draft. Colono 2021. Flu vaccine done at pharmacy.  Past medical history,surgical history, family history and social history were all reviewed and documented in the EPIC chart.  Gynecologic History No LMP recorded. Patient is postmenopausal.  Obstetric History OB History  Gravida Para Term Preterm AB Living  '2 2 2     2  '$ SAB IAB Ectopic Multiple Live Births               # Outcome Date GA Lbr Len/2nd Weight Sex Delivery Anes PTL Lv  2 Term           1 Term             ROS: A ROS was performed and pertinent positives and negatives are included in the history. GENERAL: No fevers or chills. HEENT: No change in vision, no earache, sore throat or sinus congestion. NECK: No pain or stiffness. CARDIOVASCULAR: No chest pain or pressure. No palpitations. PULMONARY: No shortness of breath, cough or wheeze. GASTROINTESTINAL: No abdominal pain, nausea, vomiting or diarrhea, melena or bright red blood per rectum. GENITOURINARY: No urinary frequency, urgency, hesitancy or dysuria. MUSCULOSKELETAL: No joint or muscle pain, no back pain, no recent trauma. DERMATOLOGIC: No rash, no itching, no lesions. ENDOCRINE: No polyuria, polydipsia, no heat or cold intolerance. No recent change in weight. HEMATOLOGICAL: No anemia or easy bruising or bleeding. NEUROLOGIC: No headache, seizures, numbness, tingling or weakness. PSYCHIATRIC: No  depression, no loss of interest in normal activity or change in sleep pattern.     Exam:  BP (!) 154/93   Pulse 70   Resp 18   Ht 4' 11.75" (1.518 m)   Wt 106 lb (48.1 kg)   BMI 20.88 kg/m   Body mass index is 20.88 kg/m.  General appearance : Well developed well nourished female. No acute distress HEENT: Eyes: no retinal hemorrhage or exudates,  Neck supple, trachea midline, no carotid bruits, no thyroidmegaly Lungs: Clear to auscultation, no rhonchi or wheezes, or rib retractions  Heart: Regular rate and rhythm, no murmurs or gallops Breast:Examined in sitting and supine position were symmetrical in appearance, no palpable masses or tenderness,  no skin retraction, no nipple inversion, no nipple discharge, no skin discoloration, no axillary or supraclavicular lymphadenopathy Abdomen: no palpable masses or tenderness, no rebound or guarding Extremities: no edema or skin discoloration or tenderness  Pelvic: Vulva: Normal             Vagina: No gross lesions or discharge  Cervix: No gross lesions or discharge  Uterus  AV, normal size, shape and consistency, non-tender and mobile  Adnexa  Without masses or tenderness  Anus: Normal   Assessment/Plan:  81 y.o. female for annual exam   1. Well female exam with routine gynecological exam Postmenopause, well on no HRT.  No PMB.  No pelvic pain. Abstinent.  Pap 03/2017 Neg.  No indication to repeat a Pap test at this time. SUI stable, wears a pad. Neuro for back pain.  Severe scoliosis. Breasts wnl. Mammo 05/2022 Benign.  BMI 20.88.  Early bladder cancer excised in 2021, followed by Urology.  Renal artery stenosis, followed by a CV surgeon. Health labs with Fam MD.  Osteoporosis BD 01/2021 on Prolia, followed by Dr Joylene Draft. Colono 2021. Flu vaccine done at pharmacy.  2. Post-menopause Postmenopause, well on no HRT.  No PMB.  No pelvic pain. Abstinent.    3. Age-related osteoporosis without current pathological fracture  Osteoporosis BD  01/2021 on Prolia, followed by Dr Joylene Draft.  4. Malignant neoplasm of urinary bladder, unspecified site ([place])  Followed by Uro.  Princess Bruins MD, 11:57 AM

## 2022-06-19 MED ORDER — AMLODIPINE BESYLATE 5 MG PO TABS: 5.0000 mg | ORAL_TABLET | Freq: Every day | ORAL | 3 refills | Status: DC

## 2022-06-19 NOTE — Telephone Encounter (Signed)
Shannon Berger, [name], MD  Golden Hurter D, LPN    I don't see that she has been on amlodipine before. I would stop hydralazine. Start amlodipine 5 mg daily. Continue bisoprolol and olmesartan  [name], [name] with pt regarding medication changes per Dr. Martinique. Pt is concerned with blood pressure running so high. Instructed pt on how and when to take her blood pressure. 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 Pt will monitor blood pressure on amlodipine for the next 2 days and call us back on Friday to let us know how she is doing. Pt verbalizes understanding.

## 2022-06-19 NOTE — Telephone Encounter (Signed)
Patient returned RN's call regarding her BP issue.

## 2022-06-20 ENCOUNTER — Telehealth (INDEPENDENT_AMBULATORY_CARE_PROVIDER_SITE_OTHER): Payer: Medicare Other | Admitting: Pharmacist

## 2022-06-20 ENCOUNTER — Ambulatory Visit: Payer: Medicare Other

## 2022-06-20 DIAGNOSIS — I10 Essential (primary) hypertension: Secondary | ICD-10-CM

## 2022-06-20 NOTE — Telephone Encounter (Signed)
Spoke with patient. Concerned about her BP being too high. Anxiety contributing so she could not sleep last night. Started amlodipine '5mg'$  last night. Has not taken blood pressure yet today. Oddly readings are lower in office (cardiology and GYN) than at home. Recommended patient relax for 5-10 minutes, take BP, and call back with result.

## 2022-06-20 NOTE — Telephone Encounter (Signed)
Patient called back with readings. Omron cuff: 154/79.  Other cuff that is plugged in: 180/72.

## 2022-06-20 NOTE — Telephone Encounter (Signed)
   Pharmacist Visit   Date of Encounter: 06/20/2022 ID: Shannon Berger, DOB [birth year], MRN [id number]  PCP:  [name], [place] [facility] Cardiologist:  Peter Martinique, MD      Spoke with patient earlier today and she was concerned regarding BP readings. Much higher than in office readings.   Patient brought both home cuffs with her today.    In room Omron cuff: 196/88 In room Lumscope cuff: 202/85 In room Dynamat: 202/71  Started amlodipine '5mg'$  yesterday. Takes amlodipine, olmesartan, and bisoprolol in the evening around 10pm.    Patient is asymptomatic. Denies CP, dizziness, SOB, HA.  Worried about her BP.  Visit Details   VS:  '@VS'$ @ , BMI There is no height or weight on file to calculate BMI.  Wt Readings from Last 3 Encounters:  06/18/22 106 lb (48.1 kg)  06/14/22 107 lb 12.8 oz (48.9 kg)  05/13/22 110 lb 3.2 oz (50 kg)     Reason for visit: BP check Performed today: Vitals and Education Changes (medications, testing, etc.) : Recommended patient take hydralazine '50mg'$  when she gets home despite it being d/c. May help lower BP until time for evening medications. Will contact patient tomorrow morning to check readings.  May need to have regimen adjusted since she takes 3 maintenance meds in the evening  Length of Visit: 15 minutes    Medications Adjustments/Labs and Tests Ordered: No orders of the defined types were placed in this encounter.  No orders of the defined types were placed in this encounter.    Alvy Bimler, [facility]  06/20/2022 3:55 PM

## 2022-06-21 ENCOUNTER — Telehealth: Payer: Self-pay | Admitting: Pharmacist

## 2022-06-21 NOTE — Telephone Encounter (Signed)
Called patient. Took blood pressure at 8:45am. Much improved.  140/70, pulse 59.    Instructed patient to check again at 4:00pm and will contact patient to check results

## 2022-06-21 NOTE — Telephone Encounter (Signed)
Called patient to check on afternoon readings. She was out to eat but took reading before she left and she was pleased because reading was ~130/70.  Advised to continue current regimen thorugh the weekend. Keep hydralazine on hand but do not take unless systolic BP >606 since it was effective yesterday. Patient voiced understanding.

## 2022-06-28 ENCOUNTER — Ambulatory Visit: Payer: Medicare Other | Admitting: Podiatry

## 2022-06-28 ENCOUNTER — Telehealth: Payer: Self-pay | Admitting: Pharmacist

## 2022-06-28 NOTE — Telephone Encounter (Signed)
Patient called to report recent BP readings.Blood pressure remains variable on amlodipine '5mg'$ , bisoprolol '10mg'$ , and olmesartan '40mg'$  daily. Has hydralazine on hand for SBP >180 which she has taken once.  Recent readings:  160/77 170/85 166/83 185/77 162/78 135/70  Will have patient increase amlodipine to '10mg'$  daily and recheck over the phone next week with updated readings

## 2022-07-09 ENCOUNTER — Telehealth: Payer: Self-pay | Admitting: Cardiology

## 2022-07-09 DIAGNOSIS — I10 Essential (primary) hypertension: Secondary | ICD-10-CM

## 2022-07-09 NOTE — Telephone Encounter (Signed)
Message sent to our pharm D Karren Cobble.

## 2022-07-09 NOTE — Telephone Encounter (Signed)
Spoke with patient. Amlodipine increased to '10mg'$  on 12/22.  Blood pressure has slowly been decreasing: 153/73 128/68 135/65 120/60 136/69 119/63  SBP dropped below 100 for the first time today. Advised to skip amlodipine dose tonight and will contact tomorrow to check readings. May need to reduce amlodipine back to '5mg'$ .

## 2022-07-09 NOTE — Telephone Encounter (Signed)
Pt c/o BP issue: STAT if pt c/o blurred vision, one-sided weakness or slurred speech  1. What are your last 5 BP readings?  1/02: 114/66 (AM)          90/64 (PM)  2. Are you having any other symptoms (ex. Dizziness, headache, blurred vision, passed out)?  Weakness   3. What is your BP issue?   Patient is requesting to speak directly with Shannon Berger regarding her BP if at all possible. She states her most recent BP reading was 90/64 and it has been running low.

## 2022-07-10 ENCOUNTER — Telehealth: Payer: Self-pay | Admitting: Pharmacist

## 2022-07-10 DIAGNOSIS — I10 Essential (primary) hypertension: Secondary | ICD-10-CM

## 2022-07-10 NOTE — Telephone Encounter (Signed)
Called and spoke with patient regarding BP readings.  Recent readings: 102/51 (1/2) 145/72 (1/2)  140/71 (1/3) 155/72 (1/3)  Will cut amlodipine back to '5mg'$  in the evening. Patient in agreement.

## 2022-07-16 DIAGNOSIS — I10 Essential (primary) hypertension: Secondary | ICD-10-CM | POA: Diagnosis not present

## 2022-07-16 DIAGNOSIS — I129 Hypertensive chronic kidney disease with stage 1 through stage 4 chronic kidney disease, or unspecified chronic kidney disease: Secondary | ICD-10-CM | POA: Diagnosis not present

## 2022-07-16 DIAGNOSIS — Z0000 Encounter for general adult medical examination without abnormal findings: Secondary | ICD-10-CM | POA: Diagnosis not present

## 2022-07-16 DIAGNOSIS — Z1331 Encounter for screening for depression: Secondary | ICD-10-CM | POA: Diagnosis not present

## 2022-07-16 DIAGNOSIS — Z1339 Encounter for screening examination for other mental health and behavioral disorders: Secondary | ICD-10-CM | POA: Diagnosis not present

## 2022-07-16 DIAGNOSIS — E785 Hyperlipidemia, unspecified: Secondary | ICD-10-CM | POA: Diagnosis not present

## 2022-07-16 DIAGNOSIS — M810 Age-related osteoporosis without current pathological fracture: Secondary | ICD-10-CM | POA: Diagnosis not present

## 2022-07-16 DIAGNOSIS — R82998 Other abnormal findings in urine: Secondary | ICD-10-CM | POA: Diagnosis not present

## 2022-07-16 DIAGNOSIS — R7989 Other specified abnormal findings of blood chemistry: Secondary | ICD-10-CM | POA: Diagnosis not present

## 2022-07-16 DIAGNOSIS — F419 Anxiety disorder, unspecified: Secondary | ICD-10-CM | POA: Diagnosis not present

## 2022-07-16 DIAGNOSIS — Z23 Encounter for immunization: Secondary | ICD-10-CM | POA: Diagnosis not present

## 2022-07-23 DIAGNOSIS — L720 Epidermal cyst: Secondary | ICD-10-CM | POA: Diagnosis not present

## 2022-07-23 DIAGNOSIS — L820 Inflamed seborrheic keratosis: Secondary | ICD-10-CM | POA: Diagnosis not present

## 2022-07-23 DIAGNOSIS — L570 Actinic keratosis: Secondary | ICD-10-CM | POA: Diagnosis not present

## 2022-07-23 DIAGNOSIS — D225 Melanocytic nevi of trunk: Secondary | ICD-10-CM | POA: Diagnosis not present

## 2022-07-23 DIAGNOSIS — L308 Other specified dermatitis: Secondary | ICD-10-CM | POA: Diagnosis not present

## 2022-07-23 DIAGNOSIS — L821 Other seborrheic keratosis: Secondary | ICD-10-CM | POA: Diagnosis not present

## 2022-08-01 DIAGNOSIS — C674 Malignant neoplasm of posterior wall of bladder: Secondary | ICD-10-CM | POA: Diagnosis not present

## 2022-08-07 DIAGNOSIS — C674 Malignant neoplasm of posterior wall of bladder: Secondary | ICD-10-CM | POA: Diagnosis not present

## 2022-08-09 DIAGNOSIS — C674 Malignant neoplasm of posterior wall of bladder: Secondary | ICD-10-CM | POA: Diagnosis not present

## 2022-08-09 DIAGNOSIS — C679 Malignant neoplasm of bladder, unspecified: Secondary | ICD-10-CM | POA: Diagnosis not present

## 2022-08-09 DIAGNOSIS — N2889 Other specified disorders of kidney and ureter: Secondary | ICD-10-CM | POA: Diagnosis not present

## 2022-08-15 DIAGNOSIS — C674 Malignant neoplasm of posterior wall of bladder: Secondary | ICD-10-CM | POA: Diagnosis not present

## 2022-08-22 ENCOUNTER — Encounter: Payer: Self-pay | Admitting: Podiatry

## 2022-08-22 ENCOUNTER — Ambulatory Visit: Payer: Medicare Other | Admitting: Podiatry

## 2022-08-22 DIAGNOSIS — M21611 Bunion of right foot: Secondary | ICD-10-CM | POA: Diagnosis not present

## 2022-08-22 DIAGNOSIS — L84 Corns and callosities: Secondary | ICD-10-CM | POA: Diagnosis not present

## 2022-08-22 DIAGNOSIS — M2041 Other hammer toe(s) (acquired), right foot: Secondary | ICD-10-CM

## 2022-08-22 NOTE — Progress Notes (Signed)
  Subjective:  Patient ID: Shannon Berger, female    DOB: 12-Apr-1941,   MRN: [id number]  Chief Complaint  Patient presents with   Callouses     Corn / callus trim     82 y.o. female presents for follow-up of corns and hammertoes.  Relates she would like them trimmed has had them trimmed in the past with relief. She also relates fungus on bilateral great toes that she has been using the penlac.  States maybe some improvement.  Denies any other pedal complaints. Denies n/v/f/c.   Past Medical History:  Diagnosis Date   Anginal pain ([place])    occasionally   Anxiety    Arthritis    Bladder cancer ([place])    Carotid arterial disease ([place])    Chronic kidney disease    Complication of anesthesia    "hallucinates for days; have a hard time waking up; almost couldn't get her awake last time; wake up wild" (05/26/2018)   Constipation    Degeneration of lumbar intervertebral disc    Dislocation of hip joint prosthesis ([place])    Essential tremor    Fusion of spine of lumbar region    Heart murmur    "slight one" (05/26/2018)   Hyperlipidemia    Hypertension    Low back pain radiating to left lower extremity    Melanoma ([place])    "center of upper chest"   Osteoarthritis    Osteopenia    Sleep difficulties    Spinal stenosis at L4-L5 level    Stroke ([place])    tia--last week   TIA (transient ischemic attack) 05/20/2018    Objective:  Physical Exam: Vascular: DP/PT pulses 2/4 bilateral. CFT <3 seconds. Normal hair growth on digits. No edema.  Skin. No lacerations or abrasions bilateral feet. Hyperkeratotic cored lesions noted sub first metatarsal bilateral. Also noted to right sub fifth met head and medial hallux and lateral second digit. Hallux nails bilateral are thickened elongated and discolored with subungual debris.  Musculoskeletal: MMT 5/5 bilateral lower extremities in DF, PF, Inversion and Eversion. Deceased ROM in DF of ankle joint. HAV deformity and bilateral hammertoes  noted. Overlapping of right second digit noted.  Neurological: Sensation intact to light touch.   Assessment:   1. Corns and callosities   2. Hammertoe of right foot   3. Bunion, right      Plan:  Patient was evaluated and treated and all questions answered. -Educated on hammertoes and treatment options  -Discussed padding including toe caps and metatarsal pads -Discussed need for potential surgery if pain does not improved. Discussed hammertoe and bunion surgery vs amputation of the second toe. Patient will consider this.  -Discussed corns and calluses with patient and treatment options.  -Hyperkeratotic tissue was debrided with chisel without incidentas courtesy.  -Applied salycylic acid treatment to area with dressing. Advised to remove bandaging tomorrow.  -Encouraged daily moisturizing -Discussed use of pumice stone -Advised good supportive shoes and inserts Follow-up as needed.     Lorenda Peck, DPM

## 2022-08-23 DIAGNOSIS — M4120 Other idiopathic scoliosis, site unspecified: Secondary | ICD-10-CM | POA: Diagnosis not present

## 2022-09-06 NOTE — Progress Notes (Unsigned)
Cardiology Office Note    Date:  09/16/2022   ID:  Shannon Berger, Shannon Berger 1940/08/15, MRN [id number]  PCP:  [name], MD  Cardiologist:  Dr. Martinique  No chief complaint on file.   History of Present Illness:  Shannon Berger is a 82 y.o. female is seen for follow up dyspnea. She has a  past medical history of carotid artery disease, hypertension, hyperlipidemia, ischemic optic neuropathy, and osteopenia.  She was seen in 2016 for evaluation of chest pain.  She was complaining of chest pain not related to exertion at the time.  She had a similar symptom in the past with normal stress echo in 2012 and normal ETT in 2008.  She eventually underwent a Myoview on 03/24/2015 which showed EF 68%, normal perfusion, overall low risk study.    Her carotid artery disease has been followed by Dr. [name] of vascular surgery.    She was seen by Almyra Deforest PA-C in October 2019 for symptoms of dyspnea on exertion. She had evaluation with an Echo showing normal LV systolic and valve function. Grade 2 diastolic dysfunction. Coronary CTA was done showing calcium score of 264 but nonobstructive CAD. It was felt that her scoliosis may be contributing to her dyspnea. She reported having PFTs but no results available for [place] to review.  In November 2019 she was admitted with aphasia and left sided weakness. CT and MRI showed no acute findings but carotid dopplers showed progression of carotid arterial disease with > 80% left carotid stenosis. She subsequently underwent left CEA by Dr [name] on [id number] without complications. 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 This has been asymptomatic so managed conservatively.   She was seen in February 2023 by Coletta Memos NP for atypical chest pain for which she was evaluated at [facility] ED. In June she had recurrent chest pain and after  discussion with DOD Dr  Sallyanne Kuster it was decided to schedule her for coronary CTA. Unfortunately she had a coronary calcium score ordered instead which really wasn't helpful. To evaluate further we did do a coronary CTA. This again showed nonobstructive CAD.   When seen previously she was getting low BP readings to 80 systolic. Was symptomatic. We stopped her lasix and potassium. She states she quit taking hydralazine as well.  She was followed by Pharm D in HTN clinic for adjustment in BP medication. Initially started on amlodipine at 5 mg then increased to 10 mg. This did bring BP down very well but getting readings as low as [id number] systolic with associated lightheadedness so reduced to 5 mg again. On BP readings now range from [id number] to [id number] systolic. She feels well.   Past Medical History:  Diagnosis Date   Anginal pain ([place])    occasionally   Anxiety    Arthritis    Bladder cancer ([place])    Carotid arterial disease ([place])    Chronic kidney disease    Complication of anesthesia    "hallucinates for days; have a hard time waking up; almost couldn't get her awake last time; wake up wild" (05/26/2018)   Constipation    Degeneration of lumbar intervertebral disc    Dislocation of hip joint prosthesis ([place])    Essential tremor    Fusion of spine of lumbar region    Heart murmur    "slight one" (05/26/2018)   Hyperlipidemia    Hypertension  Low back pain radiating to left lower extremity    Melanoma ([place])    "center of upper chest"   Osteoarthritis    Osteopenia    Sleep difficulties    Spinal stenosis at L4-L5 level    Stroke [facility])    tia--last week   TIA (transient ischemic attack) 05/20/2018    Past Surgical History:  Procedure Laterality Date   ANTERIOR LUMBAR FUSION     added rods and screws;   BACK SURGERY     BREAST BIOPSY Left    "not cancer"   BUNIONECTOMY Left    CAROTID ENDARTERECTOMY Left 05/26/2018   CATARACT EXTRACTION W/ INTRAOCULAR LENS  IMPLANT, BILATERAL Bilateral    ENDARTERECTOMY  Left 05/26/2018   Procedure: ENDARTERECTOMY CAROTID LEFT;  Surgeon: Angelia Mould, MD;  Location: [place];  Service: Vascular;  Laterality: Left;   EYE SURGERY Right    "retina wrinkle OR; now can't see out of that eye" (05/26/2018)   JOINT REPLACEMENT Left 2014   Hip   LUMBAR SPINE HARDWARE REMOVAL     "went in thru the front"   MELANOMA EXCISION     "center of upper chest"   PATCH ANGIOPLASTY Left 05/26/2018   Procedure: PATCH ANGIOPLASTY LEFT CAROTID ARTERY;  Surgeon: Angelia Mould, MD;  Location: [place];  Service: Vascular;  Laterality: Left;   PILONIDAL CYST DRAINAGE     POSTERIOR LUMBAR FUSION  X 2   TENDON REPAIR Left    Reattachment of tendon- Left hip   THUMB FUSION Right    TONSILLECTOMY AND ADENOIDECTOMY     TOTAL HIP ARTHROPLASTY Left 09/04/2012   Procedure: LEFT TOTAL HIP ARTHROPLASTY ANTERIOR APPROACH;  Surgeon: Mcarthur Rossetti, MD;  Location: [facility];  Service: Orthopedics;  Laterality: Left;  Left Total Hip Arthroplasty, Anterior Approach   TRANSURETHRAL RESECTION OF BLADDER TUMOR N/A 06/06/2020   Procedure: TRANSURETHRAL RESECTION OF BLADDER TUMOR(TURBT) WITH CYSTOSCOPY/ INSTILLATION OF GEMCITABINE WITH RIGHT STENT PLACEMENT;  Surgeon: Ceasar Mons, MD;  Location: [facility];  Service: Urology;  Laterality: N/A;    Current Medications: Outpatient Medications Prior to Visit  Medication Sig Dispense Refill   acetaminophen (TYLENOL) 500 MG tablet Take 500 mg by mouth every 6 (six) hours as needed for moderate pain or headache.     ALPRAZolam (XANAX) 0.5 MG tablet Take 0.25 mg by mouth at bedtime as needed for sleep.      amLODipine (NORVASC) 5 MG tablet Take 5 mg by mouth daily. Take 2.5 mg in the morning and 5 mg in the evening 90 tablet 3   Ascorbic Acid (VITAMIN C) 1000 MG tablet Take 1,000 mg by mouth 2 (two) times daily.     aspirin EC 81 MG tablet Take 1 tablet (81 mg total) by mouth daily. Swallow whole. 30 tablet 12   atorvastatin  (LIPITOR) 40 MG tablet Take 40 mg by mouth daily.  3   bisoprolol (ZEBETA) 10 MG tablet Take 10 mg by mouth daily.     Calcium Carbonate-Vit D-Min (CALCIUM 1200 PO) Take 1 tablet by mouth daily.     Cholecalciferol (VITAMIN D) 2000 units tablet Take 2,000 Units by mouth daily.      denosumab (PROLIA) 60 MG/ML SOSY injection Inject 60 mg into the skin every 6 (six) months.     desvenlafaxine (PRISTIQ) 100 MG 24 hr tablet Take 100 mg by mouth daily.     ezetimibe (ZETIA) 10 MG tablet Take 10 mg by mouth daily.  Multiple Vitamin (MULTIVITAMIN) tablet Take 1 tablet by mouth daily.     olmesartan (BENICAR) 40 MG tablet Take 40 mg by mouth daily.     Omega-3 Fatty Acids (FISH OIL PO) Take 1,400 mg by mouth 2 (two) times daily.     REPATHA SURECLICK [id number] MG/ML SOAJ Inject 1 Syringe into the skin every 14 (fourteen) days.     zinc gluconate 50 MG tablet Take 50 mg by mouth daily.     Facility-Administered Medications Prior to Visit  Medication Dose Route Frequency Provider Last Rate Last Admin   gemcitabine (GEMZAR) chemo syringe for bladder instillation 2,000 mg  2,000 mg Bladder Instillation Once Winter, Conception Oms, MD         Allergies:   Oxycodone-acetaminophen, Versed [midazolam], and Hydrocodone-acetaminophen   Social History   Socioeconomic History   Marital status: Married    Spouse name: jerry   Number of children: 2   Years of education: Not on file   Highest education level: Bachelor's degree (e.g., BA, AB, BS)  Occupational History   Occupation: Artist: RETIRED    Comment: retired  Tobacco Use   Smoking status: Never   Smokeless tobacco: Never  Vaping Use   Vaping Use: Never used  Substance and Sexual Activity   Alcohol use: Yes    Alcohol/week: 1.0 standard drink of alcohol    Types: 1 Glasses of wine per week    Comment: Occas.   Drug use: Never   Sexual activity: Not Currently    Partners: Male    Comment: 1st intercourse- 23,  partners- 1  Other Topics Concern   Not on file  Social History Narrative   Pt lives at home with spouse jerry, 2 story home has 2 children   She is right handed, she drinks ginger ale and come tea, no coffee, or soda   Social Determinants of Radio broadcast assistant Strain: Not on file  Food Insecurity: Not on file  Transportation Needs: Not on file  Physical Activity: Not on file  Stress: Not on file  Social Connections: Not on file     Family History:  The patient's family history includes Cancer in her brother and mother; Diabetes in her father; Healthy in her son; Heart attack in her father; Heart disease in her father; Heart failure in her mother; Hypertension in her father and mother; Migraines in her daughter; Other in her mother.   ROS:   Please see the history of present illness.    ROS All other systems reviewed and are negative.   PHYSICAL EXAM:   VS:  BP (!) 180/80 (BP Location: Right Arm, Patient Position: Sitting, Cuff Size: Normal)   Ht '4\' 11"'$  (1.499 m)   Wt 106 lb 6.4 oz (48.3 kg)   BMI 21.49 kg/m    GEN: Well nourished, well developed, in no acute distress  HEENT: normal  Neck: no JVD, carotid bruits, or masses Cardiac: RRR; no murmurs, rubs, or gallops,no edema  Respiratory:  clear to auscultation bilaterally, normal work of breathing GI: soft, nontender, nondistended, + BS MS: no deformity or atrophy  Skin: warm and dry, no rash Neuro:  Alert and Oriented x 3, Strength and sensation are intact Psych: euthymic mood, full affect  Wt Readings from Last 3 Encounters:  09/16/22 106 lb 6.4 oz (48.3 kg)  06/18/22 106 lb (48.1 kg)  06/14/22 107 lb 12.8 oz (48.9 kg)      Studies/Labs Reviewed:  Recent Labs: 06/14/2022: BUN 34; Creatinine, Ser 1.22; Potassium 5.3; Sodium 142   Lipid Panel    Component Value Date/Time   CHOL 174 05/21/2018 0412   TRIG 59 05/21/2018 0412   HDL 71 05/21/2018 0412   CHOLHDL 2.5 05/21/2018 0412   VLDL 12  05/21/2018 0412   LDLCALC 91 05/21/2018 0412   Dated 04/20/21: creatinine 1.43. otherwise CMET and CBC normal. Dated 06/15/21: cholesterol 110, triglycerides 41, HDL 75, LDL 27.  Dated 07/16/22: cholesterol 123, triglycerides 53, HDL 85, LDL 27. BUN 41, creatinine 1.2. otherwise CMET and TSH normal.   Ecg today shows NSR rate 52. Normal. I have personally reviewed and interpreted this study.  Additional studies/ records that were reviewed today include:   Myoview 03/24/2015 Study Highlights   The left ventricular ejection fraction is hyperdynamic (>65%). Nuclear stress EF: 68%. There was no ST segment deviation noted during stress. The study is normal. This is a low risk study. Normal myocardial perfusion and function   Low risk lexiscan nuclear stress test demonstrating normal myocardial perfusion and function; EF 68%,   Echo 05/21/18: Study Conclusions   - Left ventricle: The cavity size was normal. Wall thickness was   normal. Systolic function was normal. The estimated ejection   fraction was in the range of 60% to 65%. Wall motion was normal;   there were no regional wall motion abnormalities. Features are   consistent with a pseudonormal left ventricular filling pattern,   with concomitant abnormal relaxation and increased filling   pressure (grade 2 diastolic dysfunction). - Mitral valve: There was mild regurgitation. - Pulmonary arteries: Systolic pressure was mildly increased. PA   peak pressure: 34 mm Hg (S).  ADDENDUM REPORT: 06/01/2018 22:20   CLINICAL DATA:  Chest pain   EXAM: Cardiac CTA   MEDICATIONS: Sub lingual nitro. '4mg'$  x 2   TECHNIQUE: The patient was scanned on a Siemens [id number] slice scanner. Gantry rotation speed was 250 msecs. Collimation was 0.6 mm. A 100 kV prospective scan was triggered in the ascending thoracic aorta at 35-75% of the R-R interval. Average HR during the scan was 60 bpm. The 3D data set was interpreted on a dedicated work station  using MPR, MIP and VRT modes. A total of 80cc of contrast was used.   FINDINGS: Non-cardiac: See separate report from [facility].   The pulmonary veins drained normally to the left atrium.   Calcium Score: 260 Agatston units.   Coronary Arteries: Right dominant with no anomalies   LM: No plaque or stenosis.   LAD system: Calcified plaque proximal LAD with mild (<50%) stenosis.   Circumflex system: Moderate ramus with no significant disease. The AV LCx was relatively small with no plaque or stenosis.   RCA system: Calcified plaque with mild stenosis (<50%) in the proximal RCA.   IMPRESSION: 1. Coronary artery calcium score 260 Agatston units. This places the patient in the 73rd percentile for age and gender, suggesting intermediate risk for future cardiac events.   2.  Nonobstructive CAD.   Dalton Mclean   OVER-READ INTERPRETATION  CT CHEST   The following report is an over-read performed by radiologist Dr. Rolm Baptise of [facility], [place] on 06/01/2018. This over-read does not include interpretation of cardiac or coronary anatomy or pathology. The coronary CTA interpretation by the cardiologist is attached.   COMPARISON:  Chest CT 12/26/2015   FINDINGS: Vascular: Tortuous aorta.  No aneurysm.  Heart is normal size.   Mediastinum/Nodes: No adenopathy in the  lower mediastinum or hila.   Lungs/Pleura: No confluent opacities or effusions.   Upper Abdomen: Small low-density lesion in the right hepatic lobe is stable most compatible with cyst. No acute findings   Musculoskeletal: Chest wall soft tissues are unremarkable. No acute bony abnormality.   IMPRESSION: No acute extra cardiac abnormality.   Tortuous aorta.   Electronically Signed: By: Rolm Baptise M.D. On: 06/01/2018 11:45  Coronary CTA 02/07/22:  EXAM: OVER-READ INTERPRETATION  CT CHEST   The following report is a limited chest CT over-read performed by radiologist Dr. Lindaann Slough [facility] on 02/07/2022. This over-read does not include interpretation of cardiac or coronary anatomy or pathology. The coronary CTA interpretation by the cardiologist is attached.   COMPARISON:  Chest CTA 02/06/2021   FINDINGS: Mediastinum/Nodes: No enlarged lymph nodes within the visualized mediastinum.   Lungs/Pleura: There is no pleural effusion. Stable mild bronchiectasis and scarring, primarily within the right middle lobe and lingula. No airspace disease or suspicious pulmonary nodule.   Upper abdomen: The visualized upper abdomen appears stable without significant findings. Probable cyst in the dome of the right hepatic lobe appears unchanged.   Musculoskeletal/Chest wall: No chest wall mass or suspicious osseous findings within the visualized chest. Stable chronic compression deformities at T11 and T12.   IMPRESSION: No significant extracardiac findings within the visualized chest.     Electronically Signed   By: Richardean Sale M.D.   On: 02/07/2022 14:55    Addended by Richardean Sale, MD on 02/07/2022  2:57 PM   Study Result  Narrative & Impression  CLINICAL DATA:  68F with hypertension, hyperlipidemia, carotid stenosis, and dyspnea.   EXAM: Cardiac/Coronary  CT   TECHNIQUE: The patient was scanned on a [employer].   FINDINGS: A 120 kV prospective scan was triggered in the descending thoracic aorta at 111 HU's. Axial non-contrast 3 mm slices were carried out through the heart. The data set was analyzed on a dedicated work station and scored using the [place]. Gantry rotation speed was 250 msecs and collimation was .6 mm. No beta blockade and 0.8 mg of sl NTG was given. The 3D data set was reconstructed in 5% intervals of the 67-82 % of the R-R cycle. Diastolic phases were analyzed on a dedicated work station using MPR, MIP and VRT modes. The patient received 80 cc of contrast.   Aorta: Normal size.  No  calcifications.  No dissection.   Aortic Valve:  Trileaflet.  No calcifications.   Coronary Arteries:  Normal coronary origin.  Right dominance.   RCA is a large dominant artery that gives rise to PDA and PLVB. There is moderate(50-69%) calcified plaque in the proximal RCA and minimal (<25%) calcified plaque in the mid vessel.   Left main is a large artery that gives rise to LAD and LCX arteries.   LAD is a large vessel that has mild (25-49%) calcified plaque proximally and minimal (<25%) soft plaque distally. D1 is a small vessel without plaque.   LCX is a non-dominant artery.  There is no plaque.   Coronary Calcium Score:   Left main: 0   Left anterior descending artery: 223   Left circumflex artery: 0   Right coronary artery: 104   Total: 326   Percentile: 72nd   Other findings:   Normal pulmonary vein drainage into the left atrium.   Normal let atrial appendage without a thrombus.   Normal size of the pulmonary artery.   Mild mitral  annular calcification.   IMPRESSION: 1. Coronary calcium score of 326. This was 72nd percentile for age-, race-, and sex-matched controls.   2. Normal coronary origin with right dominance.   3. Moderate (50-69%) plaque in the RCA and mild (25-49%) plaque in the LAD. CAD-RADS 3.   4.  Will send study for FFRct.   Interpretation of the non-cardiac intrathoracic structures by Radiology is pending.   Skeet Latch, MD   Electronically Signed: By: Skeet Latch M.D. On: 02/07/2022 13:17    FFRCT ANALYSIS for abnormal coronary CT-A.   FINDINGS: FFRct analysis was performed on the original cardiac CT angiogram dataset. Diagrammatic representation of the FFRct analysis is provided in a separate PDF document in PACS. This dictation was created using the PDF document and an interactive 3D model of the results. 3D model is not available in the EMR/PACS. Normal FFR range is >0.80.   1. Left Main: FFRct 0.99   2.  LAD: FFRct 0.92 proximal, 0.91 mid   3. LCX: FFRct 0.97   4. Ramus: FFRct 0.95   5. RCA: FFRct 0.94 proximal, 0.91 mid, 0.91 distal   IMPRESSION: FFRct findings are consistent with non-obstructive CAD.   Tiffany C. Oval Linsey, MD     Electronically Signed   By: Skeet Latch M.D.   On: 02/07/2022 13:22    ASSESSMENT:    No diagnosis found.     PLAN:  In order of problems listed above:  CAD.  Had nonobstructive CAD by CTA in 2019. Repeat CTA recently again showed nonobstructive CAD. Normal FFR evaluation. Continue to focus on risk factor modification. Patient reassured.   Bilateral carotid artery disease: s/p recent left CEA. Dopplers in Jan ok. Follow up with Dr [name] later this month.    Hypertension: Blood pressure has been labile.Now with persistent quite high readings.  Will continue current bisoprolol, omesartan and amlodipine. I have recommended increasing amlodipine to 2.5 mg in the am and 5 mg in the pm. Will arrange follow up in HTN clinic in 2 months. She has hydralazine to take PRN  Hyperlipidemia:on lipitor, Zetia and fish oil. Goal LDL < 70. Last LDL 27 on Repatha.  6.   Mesenteric stenosis - celiac axis and superior mesenteric arteries 70%. Followed by [facility].     Medication Adjustments/Labs and Tests Ordered: Current medicines are reviewed at length with the patient today.  Concerns regarding medicines are outlined above.  Medication changes, Labs and Tests ordered today are listed in the Patient Instructions below. There are no Patient Instructions on file for this visit.    Signed, Manroop Jakubowicz Martinique, MD  09/16/2022 3:35 PM    [facility] [address], [place], [place]  [address] Phone: [phone]; Fax: [phone]

## 2022-09-10 ENCOUNTER — Other Ambulatory Visit: Payer: Self-pay

## 2022-09-10 DIAGNOSIS — I6523 Occlusion and stenosis of bilateral carotid arteries: Secondary | ICD-10-CM

## 2022-09-16 ENCOUNTER — Ambulatory Visit: Payer: Medicare Other | Attending: Cardiology | Admitting: Cardiology

## 2022-09-16 ENCOUNTER — Encounter: Payer: Self-pay | Admitting: Cardiology

## 2022-09-16 VITALS — BP 180/80 | Ht 59.0 in | Wt 106.4 lb

## 2022-09-16 DIAGNOSIS — I10 Essential (primary) hypertension: Secondary | ICD-10-CM

## 2022-09-16 DIAGNOSIS — I6523 Occlusion and stenosis of bilateral carotid arteries: Secondary | ICD-10-CM | POA: Diagnosis not present

## 2022-09-16 MED ORDER — AMLODIPINE BESYLATE 5 MG PO TABS: ORAL_TABLET | ORAL | 3 refills | Status: DC

## 2022-09-16 MED ORDER — AMLODIPINE BESYLATE 5 MG PO TABS: 5.0000 mg | ORAL_TABLET | Freq: Every day | ORAL | 3 refills | Status: DC

## 2022-09-16 NOTE — Patient Instructions (Addendum)
Medication Instructions:   INCREASE Amlodipine to 2.5 mg in the mornings and 5 mg in the evenings   *If you need a refill on your cardiac medications before your next appointment, please call your pharmacy*  Lab Work: NONE ordered at this time of appointment   If you have labs (blood work) drawn today and your tests are completely normal, you will receive your results only by: [place] (if you have MyChart) OR A paper copy in the mail If you have any lab test that is abnormal or we need to change your treatment, we will call you to review the results.  Testing/Procedures: NONE ordered at this time of appointment   Follow-Up: At [facility], you and your health needs are our priority.  As part of our continuing mission to provide you with exceptional heart care, we have created designated Provider Care Teams.  These Care Teams include your primary Cardiologist (physician) and Advanced Practice Providers (APPs -  Physician Assistants and Nurse Practitioners) who all work together to provide you with the care you need, when you need it.  Your next appointment:   2 month(s)  Provider:   Pharm D hypertension  clinic   Then, Peter Martinique, MD will plan to see you again in 6 month(s).    Other Instructions

## 2022-09-17 NOTE — Addendum Note (Signed)
Addended by: Wonda Horner on: 09/17/2022 08:11 AM   Modules accepted: Orders

## 2022-09-20 ENCOUNTER — Ambulatory Visit (INDEPENDENT_AMBULATORY_CARE_PROVIDER_SITE_OTHER): Payer: Medicare Other | Admitting: Obstetrics & Gynecology

## 2022-09-20 ENCOUNTER — Encounter: Payer: Self-pay | Admitting: Obstetrics & Gynecology

## 2022-09-20 VITALS — BP 120/74 | HR 68 | Resp 16

## 2022-09-20 DIAGNOSIS — C679 Malignant neoplasm of bladder, unspecified: Secondary | ICD-10-CM

## 2022-09-20 DIAGNOSIS — R9389 Abnormal findings on diagnostic imaging of other specified body structures: Secondary | ICD-10-CM

## 2022-09-20 DIAGNOSIS — Z780 Asymptomatic menopausal state: Secondary | ICD-10-CM | POA: Diagnosis not present

## 2022-09-20 NOTE — Progress Notes (Signed)
    Shannon Berger 1940-08-25 [id number]        82 y.o.  G2P2002   RP: Thickened Endometrium on CT Scan  HPI: Dx of a Low grade Ta UCC.  CT scan 08/10/22, ordered by Urologist, made an incidental finding of a thickened endometrial line at 10 mm.  Postmenopause on no HRT.  No PMB.  No pelvic pain.   OB History  Gravida Para Term Preterm AB Living  2 2 2     2   SAB IAB Ectopic Multiple Live Births               # Outcome Date GA Lbr Len/2nd Weight Sex Delivery Anes PTL Lv  2 Term           1 Term             Past medical history,surgical history, problem list, medications, allergies, family history and social history were all reviewed and documented in the EPIC chart.   Directed ROS with pertinent positives and negatives documented in the history of present illness/assessment and plan.  Exam:  Vitals:   09/20/22 1032  BP: 120/74  Pulse: 68  Resp: 16   General appearance:  Normal  Gynecologic exam: Vulva normal.  Speculum:  Cervix/Vagina normal.  No blood, no discharge.   Assessment/Plan:  82 y.o. G2P2002   1. Thickened endometrium Dx of a Low grade Ta UCC.  CT scan 08/10/22, ordered by Urologist, made an incidental finding of a thickened endometrial line at 10 mm.  Postmenopause on no HRT.  No PMB.  No pelvic pain.  Normal Gyn exam today.  Will further investigate with a Pelvic US/Doppler.  F/U in 2 weeks to discuss results. - US Transvaginal Non-OB; Future  2. Post-menopause No HRT.  3. Malignant neoplasm of urinary bladder, low grade Ta UCC   Princess Bruins MD, 10:46 AM 09/20/2022

## 2022-09-23 ENCOUNTER — Other Ambulatory Visit: Payer: Self-pay

## 2022-09-23 ENCOUNTER — Telehealth: Payer: Self-pay

## 2022-09-23 DIAGNOSIS — R9389 Abnormal findings on diagnostic imaging of other specified body structures: Secondary | ICD-10-CM

## 2022-09-23 DIAGNOSIS — N3000 Acute cystitis without hematuria: Secondary | ICD-10-CM | POA: Diagnosis not present

## 2022-09-23 NOTE — Telephone Encounter (Signed)
Schedule a Pelvic US at [facility] within < 2 weeks Received: 3 days ago Princess Bruins, [place] Triage Thickened endometrium at 10 mm on CT scan.  No PMB.  Patient will f/u with me in 2 weeks to discuss results.

## 2022-09-23 NOTE — Telephone Encounter (Signed)
Patient is scheduled 09/24/22 for pelvic u/s at [facility] and has visit with Dr. Marguerita Merles 10/03/22 at 1:45pm.

## 2022-09-24 ENCOUNTER — Ambulatory Visit
Admission: RE | Admit: 2022-09-24 | Discharge: 2022-09-24 | Disposition: A | Discharge status: Home / Self Care | Payer: Medicare Other | Source: Ambulatory Visit | Attending: Obstetrics & Gynecology | Admitting: Obstetrics & Gynecology

## 2022-09-24 DIAGNOSIS — N854 Malposition of uterus: Secondary | ICD-10-CM | POA: Diagnosis not present

## 2022-09-24 DIAGNOSIS — R9389 Abnormal findings on diagnostic imaging of other specified body structures: Secondary | ICD-10-CM

## 2022-09-24 DIAGNOSIS — N8500 Endometrial hyperplasia, unspecified: Secondary | ICD-10-CM | POA: Diagnosis not present

## 2022-09-24 DIAGNOSIS — Z780 Asymptomatic menopausal state: Secondary | ICD-10-CM | POA: Diagnosis not present

## 2022-09-26 ENCOUNTER — Ambulatory Visit (HOSPITAL_COMMUNITY)
Admission: RE | Admit: 2022-09-26 | Discharge: 2022-09-26 | Disposition: A | Discharge status: Home / Self Care | Payer: Medicare Other | Source: Ambulatory Visit | Attending: Vascular Surgery | Admitting: Vascular Surgery

## 2022-09-26 ENCOUNTER — Encounter: Payer: Self-pay | Admitting: Vascular Surgery

## 2022-09-26 ENCOUNTER — Ambulatory Visit: Payer: Medicare Other | Admitting: Vascular Surgery

## 2022-09-26 VITALS — BP 173/76 | HR 49 | Temp 97.8°F | Resp 20 | Ht 59.0 in | Wt 105.2 lb

## 2022-09-26 DIAGNOSIS — I771 Stricture of artery: Secondary | ICD-10-CM

## 2022-09-26 DIAGNOSIS — I6523 Occlusion and stenosis of bilateral carotid arteries: Secondary | ICD-10-CM

## 2022-09-26 DIAGNOSIS — K551 Chronic vascular disorders of intestine: Secondary | ICD-10-CM | POA: Diagnosis not present

## 2022-09-26 NOTE — Progress Notes (Signed)
REASON FOR VISIT:   Follow-up after left carotid endarterectomy  MEDICAL ISSUES:   S/P LEFT CAROTID ENDARTERECTOMY: This patient has no evidence of recurrent carotid artery stenosis on the left where she had a carotid endarterectomy.  She has no right carotid stenosis.  She is asymptomatic.  She is on aspirin and is on a statin.  She is not a smoker.  I have recommended that we continue with the yearly carotid duplex scan.  I explained that I will be retiring.  I offered to have her seen on the PA schedule but she would prefer to have a physician.  I will arrange for her to see a physician for her follow-up duplex in 1 year.  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  She is asymptomatic.  She has no postprandial abdominal pain or weight loss.  No further follow-up is needed unless she develops symptoms of mesenteric ischemia.  CHRONIC VENOUS DISEASE: She had some reticular veins that she was concerned about on exam.  We have discussed conservative measures for venous disease.  I explained the importance of trying to avoid prolonged sitting and standing.  We discussed importance of exercise specifically walking and water aerobics.  I encouraged her to consider knee-high compression stockings when she will be standing or traveling.  We discussed importance of leg elevation and the proper positioning for this.  HPI:   Shannon Berger is a pleasant 82 y.o. female who I last saw on 07/19/2021.  She underwent a left carotid endarterectomy in 2019.  At the time of her last follow-up visit she had no evidence of recurrent stenosis on the left.  She had no significant right carotid stenosis.  In addition, she has a history of a moderate stenosis of the celiac axis and superior mesenteric artery.  However she was asymptomatic with no postprandial abdominal pain or weight loss.  She comes in for a 1 year follow-up  visit.  Since I saw her last, she denies any history of stroke, TIAs, expressive or receptive aphasia, or amaurosis fugax.  She denies any abdominal pain or weight loss.  She denies any postprandial abdominal pain.  She does have some chronic back pain issues which limit her walking.  Past Medical History:  Diagnosis Date   Anginal pain ([place])    occasionally   Anxiety    Arthritis    Bladder cancer ([place])    Carotid arterial disease ([place])    Chronic kidney disease    Complication of anesthesia    "hallucinates for days; have a hard time waking up; almost couldn't get her awake last time; wake up wild" (05/26/2018)   Constipation    Degeneration of lumbar intervertebral disc    Dislocation of hip joint prosthesis ([place])    Essential tremor    Fusion of spine of lumbar region    Heart murmur    "slight one" (05/26/2018)   Hyperlipidemia    Hypertension    Low back pain radiating to left lower extremity    Melanoma ([place])    "center of upper chest"   Osteoarthritis    Osteopenia    Sleep difficulties    Spinal stenosis at L4-L5 level    Stroke [facility])    tia--last week   TIA (transient ischemic attack) 05/20/2018    Family History  Problem Relation Age of Onset   Heart failure Mother  Cancer Mother        colon   Hypertension Mother    Other Mother        Varicose Veins   Diabetes Father    Heart disease Father    Hypertension Father    Heart attack Father    Cancer Brother        squamos cell - head, neck, ear   Healthy Son    Migraines Daughter     SOCIAL HISTORY: Social History   Tobacco Use   Smoking status: Never   Smokeless tobacco: Never  Substance Use Topics   Alcohol use: Not Currently    Comment: 1 a month    Allergies  Allergen Reactions   Oxycodone-Acetaminophen     hallucinations   Versed [Midazolam] Other (See Comments)    Had trouble waking up    Hydrocodone-Acetaminophen     hallucination     Current Outpatient Medications   Medication Sig Dispense Refill   acetaminophen (TYLENOL) 500 MG tablet Take 500 mg by mouth every 6 (six) hours as needed for moderate pain or headache.     ALPRAZolam (XANAX) 0.5 MG tablet Take 0.25 mg by mouth at bedtime as needed for sleep.      amLODipine (NORVASC) 5 MG tablet Take 0.5 tablets (2.5 mg total) by mouth every morning AND 1 tablet (5 mg total) every evening. 135 tablet 3   Ascorbic Acid (VITAMIN C) 1000 MG tablet Take 1,000 mg by mouth 2 (two) times daily.     aspirin EC 81 MG tablet Take 1 tablet (81 mg total) by mouth daily. Swallow whole. 30 tablet 12   atorvastatin (LIPITOR) 40 MG tablet Take 40 mg by mouth daily.  3   bisoprolol (ZEBETA) 10 MG tablet Take 10 mg by mouth daily.     Calcium Carbonate-Vit D-Min (CALCIUM 1200 PO) Take 1 tablet by mouth daily.     Cholecalciferol (VITAMIN D) 2000 units tablet Take 2,000 Units by mouth daily.      denosumab (PROLIA) 60 MG/ML SOSY injection Inject 60 mg into the skin every 6 (six) months.     desvenlafaxine (PRISTIQ) 100 MG 24 hr tablet Take 100 mg by mouth daily.     ezetimibe (ZETIA) 10 MG tablet Take 10 mg by mouth daily.     Multiple Vitamin (MULTIVITAMIN) tablet Take 1 tablet by mouth daily.     olmesartan (BENICAR) 40 MG tablet Take 40 mg by mouth daily.     Omega-3 Fatty Acids (FISH OIL PO) Take 1,400 mg by mouth 2 (two) times daily.     REPATHA SURECLICK [id number] MG/ML SOAJ Inject 1 Syringe into the skin every 14 (fourteen) days.     zinc gluconate 50 MG tablet Take 50 mg by mouth daily.     No current facility-administered medications for this visit.   Facility-Administered Medications Ordered in Other Visits  Medication Dose Route Frequency Provider Last Rate Last Admin   gemcitabine (GEMZAR) chemo syringe for bladder instillation 2,000 mg  2,000 mg Bladder Instillation Once Winter, Shawnn Bouillon Aaron, MD        REVIEW OF SYSTEMS:  [X]  denotes positive finding, [ ]  denotes negative finding Cardiac  Comments:  Chest  pain or chest pressure:    Shortness of breath upon exertion:    Short of breath when lying flat:    Irregular heart rhythm:        Vascular    Pain in calf, thigh, or hip brought on by ambulation:  Pain in feet at night that wakes you up from your sleep:     Blood clot in your veins:    Leg swelling:         Pulmonary    Oxygen at home:    Productive cough:     Wheezing:         Neurologic    Sudden weakness in arms or legs:     Sudden numbness in arms or legs:     Sudden onset of difficulty speaking or slurred speech:    Temporary loss of vision in one eye:     Problems with dizziness:         Gastrointestinal    Blood in stool:     Vomited blood:         Genitourinary    Burning when urinating:     Blood in urine:        Psychiatric    Major depression:         Hematologic    Bleeding problems:    Problems with blood clotting too easily:        Skin    Rashes or ulcers:        Constitutional    Fever or chills:     PHYSICAL EXAM:   Vitals:   09/26/22 0820 09/26/22 0822  BP: (!) 170/84 (!) 173/76  Pulse: (!) 49   Resp: 20   Temp: 97.8 F (36.6 C)   SpO2: 98%   Weight: 105 lb 3.2 oz (47.7 kg)   Height: 4\' 11"  (1.499 m)     GENERAL: The patient is a well-nourished female, in no acute distress. The vital signs are documented above. CARDIAC: There is a regular rate and rhythm.  VASCULAR: I do not detect carotid bruits. She has palpable posterior tibial pulses. She has some telangiectasias and reticular veins bilaterally. PULMONARY: There is good air exchange bilaterally without wheezing or rales. ABDOMEN: Soft and non-tender with normal pitched bowel sounds.  MUSCULOSKELETAL: There are no major deformities or cyanosis. NEUROLOGIC: No focal weakness or paresthesias are detected. SKIN: There are no ulcers or rashes noted. PSYCHIATRIC: The patient has a normal affect.  DATA:    CAROTID DUPLEX: I have independently interpreted her carotid duplex  scan today.  On the right side she has a less than 39% stenosis.  The right vertebral artery is patent with antegrade flow.  On the left side there is no evidence of recurrent carotid stenosis.  The left vertebral artery is patent with antegrade flow.  [name] [facility] [phone]

## 2022-10-03 ENCOUNTER — Ambulatory Visit (INDEPENDENT_AMBULATORY_CARE_PROVIDER_SITE_OTHER): Payer: Medicare Other | Admitting: Obstetrics & Gynecology

## 2022-10-03 ENCOUNTER — Encounter: Payer: Self-pay | Admitting: Obstetrics & Gynecology

## 2022-10-03 VITALS — BP 120/82 | HR 68

## 2022-10-03 DIAGNOSIS — R9389 Abnormal findings on diagnostic imaging of other specified body structures: Secondary | ICD-10-CM | POA: Diagnosis not present

## 2022-10-03 NOTE — Progress Notes (Signed)
    NYAMAL MIXSON 28-Feb-1941 [id number]        82 y.o.  G2P2L2 Accompanied by husband  RP: Counseling on Pelvic US findings done for a Thickened endometrium  HPI:  Dx of a Low grade Ta UCC.  CT scan 08/10/22, ordered by Urologist, made an incidental finding of a thickened endometrial line at 10 mm.  Postmenopause on no HRT.  No PMB.  No pelvic pain.     OB History  Gravida Para Term Preterm AB Living  2 2 2     2   SAB IAB Ectopic Multiple Live Births               # Outcome Date GA Lbr Len/2nd Weight Sex Delivery Anes PTL Lv  2 Term           1 Term             Past medical history,surgical history, problem list, medications, allergies, family history and social history were all reviewed and documented in the EPIC chart.   Directed ROS with pertinent positives and negatives documented in the history of present illness/assessment and plan.  Exam:  Vitals:   10/03/22 1337  BP: 120/82  Pulse: 68   General appearance:  Normal  Pelvic US 09/24/22:  Uterus Measurements: 5.1 x 3.3 x 5.0 cm = volume: 44 mL. Anteverted. Atrophic. Heterogeneous. No masses. Endometrium Thickness: 3 mm.  Endometrial fluid present.  No mass. Right ovary Measurements: 1.2 x 0.6 x 1.5 cm = volume: 0.5 mL. Normal morphology without mass Left ovary Measurements: 1.1 x 1.0 x 1.4 cm = volume: 0.8 mL. Normal morphology without mass No free pelvic fluid or adnexal masses.   Assessment/Plan:  82 y.o. G2P2002   1. Thickened endometrium  Dx of a Low grade Ta UCC.  CT scan 08/10/22, ordered by Urologist, made an incidental finding of a thickened endometrial line at 10 mm.  Postmenopause on no HRT.  No PMB.  No pelvic pain. Pelvic US findings reviewed thoroughly with patient and husband. Thin Endometrium at 3 mm.  Endometrial fluid present.  No mass.  Pelvic US also showing a normal uterus with bilateral normal ovaries and no FF.  Patient and husband reassured, no need for further  investigation.  Princess Bruins MD, 1:58 PM 10/03/2022

## 2022-10-16 DIAGNOSIS — F331 Major depressive disorder, recurrent, moderate: Secondary | ICD-10-CM | POA: Diagnosis not present

## 2022-10-16 DIAGNOSIS — F411 Generalized anxiety disorder: Secondary | ICD-10-CM | POA: Diagnosis not present

## 2022-10-17 DIAGNOSIS — H35371 Puckering of macula, right eye: Secondary | ICD-10-CM | POA: Diagnosis not present

## 2022-10-17 DIAGNOSIS — H47011 Ischemic optic neuropathy, right eye: Secondary | ICD-10-CM | POA: Diagnosis not present

## 2022-10-17 DIAGNOSIS — Z961 Presence of intraocular lens: Secondary | ICD-10-CM | POA: Diagnosis not present

## 2022-10-29 ENCOUNTER — Ambulatory Visit: Payer: Medicare Other | Admitting: Podiatry

## 2022-10-29 DIAGNOSIS — L84 Corns and callosities: Secondary | ICD-10-CM | POA: Diagnosis not present

## 2022-10-29 NOTE — Progress Notes (Signed)
Subjective:  Patient ID: Shannon Berger, female    DOB: 1940/09/11,  MRN: [id number]  Chief Complaint  Patient presents with   Callouses    82 y.o. female presents with the above complaint.  Patient presents with right hallux heloma molle/callus formation between the first and second toe.  Patient states painful toes debridement does give her relief she has been seen by Dr. Ralene Cork in the past for debridement.  She denies any other acute complaints.  She would like for me to take care of it.  She has chronic kidney disease.  She is not a diabetic.   Review of Systems: Negative except as noted in the HPI. Denies N/V/F/Ch.  Past Medical History:  Diagnosis Date   Anginal pain ([facility])    occasionally   Anxiety    Arthritis    Bladder cancer ([facility])    Carotid arterial disease ([facility])    Chronic kidney disease    Complication of anesthesia    "hallucinates for days; have a hard time waking up; almost couldn't get her awake last time; wake up wild" (05/26/2018)   Constipation    Degeneration of lumbar intervertebral disc    Dislocation of hip joint prosthesis ([facility])    Essential tremor    Fusion of spine of lumbar region    Heart murmur    "slight one" (05/26/2018)   Hyperlipidemia    Hypertension    Low back pain radiating to left lower extremity    Melanoma ([facility])    "center of upper chest"   Osteoarthritis    Osteopenia    Sleep difficulties    Spinal stenosis at L4-L5 level    Stroke [facility])    tia--last week   TIA (transient ischemic attack) 05/20/2018    Current Outpatient Medications:    acetaminophen (TYLENOL) 500 MG tablet, Take 500 mg by mouth every 6 (six) hours as needed for moderate pain or headache., Disp: , Rfl:    ALPRAZolam (XANAX) 0.5 MG tablet, Take 0.25 mg by mouth at bedtime as needed for sleep. , Disp: , Rfl:    amLODipine (NORVASC) 5 MG tablet, Take 0.5 tablets (2.5 mg total) by mouth every morning AND 1 tablet (5 mg total) every evening., Disp: 135  tablet, Rfl: 3   Ascorbic Acid (VITAMIN C) 1000 MG tablet, Take 1,000 mg by mouth 2 (two) times daily., Disp: , Rfl:    aspirin EC 81 MG tablet, Take 1 tablet (81 mg total) by mouth daily. Swallow whole., Disp: 30 tablet, Rfl: 12   atorvastatin (LIPITOR) 40 MG tablet, Take 40 mg by mouth daily., Disp: , Rfl: 3   bisoprolol (ZEBETA) 10 MG tablet, Take 10 mg by mouth daily., Disp: , Rfl:    Calcium Carbonate-Vit D-Min (CALCIUM 1200 PO), Take 1 tablet by mouth daily., Disp: , Rfl:    Cholecalciferol (VITAMIN D) 2000 units tablet, Take 2,000 Units by mouth daily. , Disp: , Rfl:    clotrimazole-betamethasone (LOTRISONE) cream, Apply 1 Application topically 2 (two) times daily., Disp: 30 g, Rfl: 0   denosumab (PROLIA) 60 MG/ML SOSY injection, Inject 60 mg into the skin every 6 (six) months., Disp: , Rfl:    desvenlafaxine (PRISTIQ) 100 MG 24 hr tablet, Take 100 mg by mouth daily., Disp: , Rfl:    ezetimibe (ZETIA) 10 MG tablet, Take 10 mg by mouth daily., Disp: , Rfl:    Multiple Vitamin (MULTIVITAMIN) tablet, Take 1 tablet by mouth daily., Disp: , Rfl:    olmesartan (  BENICAR) 40 MG tablet, Take 40 mg by mouth daily., Disp: , Rfl:    REPATHA SURECLICK 140 MG/ML SOAJ, Inject 1 Syringe into the skin every 14 (fourteen) days., Disp: , Rfl:    zinc gluconate 50 MG tablet, Take 50 mg by mouth daily., Disp: , Rfl:  No current facility-administered medications for this visit.  Facility-Administered Medications Ordered in Other Visits:    gemcitabine (GEMZAR) chemo syringe for bladder instillation 2,000 mg, 2,000 mg, Bladder Instillation, Once, Winter, Dorian Furnace, MD  Social History   Tobacco Use  Smoking Status Never  Smokeless Tobacco Never    Allergies  Allergen Reactions   Oxycodone-Acetaminophen     hallucinations   Versed [Midazolam] Other (See Comments)    Had trouble waking up    Hydrocodone-Acetaminophen     hallucination    Objective:  There were no vitals filed for this  visit. There is no height or weight on file to calculate BMI. Constitutional Well developed. Well nourished.  Vascular Dorsalis pedis pulses palpable bilaterally. Posterior tibial pulses palpable bilaterally. Capillary refill normal to all digits.  No cyanosis or clubbing noted. Pedal hair growth normal.  Neurologic Normal speech. Oriented to person, place, and time. Epicritic sensation to light touch grossly present bilaterally.  Dermatologic Heloma molle noted to the first metatarsophalangeal joint right foot.  No open wounds or lesion noted.  Debridement was done to the lesion.  No pinpoint bleeding noted.  Central nucleated core noted  Orthopedic: Normal joint ROM without pain or crepitus bilaterally. No visible deformities. No bony tenderness.   Radiographs: None Assessment:   1. Heloma molle    Plan:  Patient was evaluated and treated and all questions answered.  Right hallux heloma molle -All questions and concerns were discussed with the patient in extensive detail -Given the amount of pain patient is beneficial benefit from debridement of the lesion using chisel blade handle as a courtesy the lesion was debrided down to healthy scar tissue no complication noted no pinpoint bleeding noted.  I discussed briefly surgical options as well. -Shoe gear modification and toe protectors were dispensed  No follow-ups on file.

## 2022-10-30 ENCOUNTER — Telehealth: Payer: Self-pay | Admitting: Podiatry

## 2022-10-30 NOTE — Telephone Encounter (Signed)
Pt called again to inquire about Rx that has not been received at the [facility] pharmacy.

## 2022-10-30 NOTE — Telephone Encounter (Signed)
Pt called and was seen yesterday and was to have a medication sent to the [facility] pharmacy in [place] and she has called 2 times and they have not received anything. Can you please send medication in for pt.

## 2022-10-31 MED ORDER — CLOTRIMAZOLE-BETAMETHASONE 1-0.05 % EX CREA: 1.0000 | TOPICAL_CREAM | Freq: Two times a day (BID) | CUTANEOUS | 0 refills | Status: DC

## 2022-10-31 NOTE — Addendum Note (Signed)
Addended by: Nicholes Rough on: 10/31/2022 08:10 AM   Modules accepted: Orders

## 2022-11-14 DIAGNOSIS — C671 Malignant neoplasm of dome of bladder: Secondary | ICD-10-CM | POA: Diagnosis not present

## 2022-11-14 DIAGNOSIS — Z471 Aftercare following joint replacement surgery: Secondary | ICD-10-CM | POA: Diagnosis not present

## 2022-11-14 DIAGNOSIS — Z96642 Presence of left artificial hip joint: Secondary | ICD-10-CM | POA: Diagnosis not present

## 2022-11-19 ENCOUNTER — Encounter: Payer: Self-pay | Admitting: Student

## 2022-11-19 ENCOUNTER — Ambulatory Visit: Payer: Medicare Other | Attending: Cardiology | Admitting: Student

## 2022-11-19 VITALS — BP 140/71 | HR 50

## 2022-11-19 DIAGNOSIS — I10 Essential (primary) hypertension: Secondary | ICD-10-CM

## 2022-11-19 NOTE — Assessment & Plan Note (Signed)
Assessment: BP goal <140/90 given advance age can consider lenient BP  goal  In office BP 140/ 71 mmHg with HR 50  at home when she wait and sit for few mins before checking her BP it ~ 114/58 -128/65 with heart rate 50-53 but when she does not wait her BP ~138-142/68-71 heart rate 53-55 Takes current BP medications (amlodipine 2.5 mg in the morning and 5 mg in the evening, bisoprolol 10 mg daily in the evening , olmesartan 40 mg daily in the morning) regularly and tolerates them well  Reiterated importance of being consist in following home BP measurement steps  Denies SOB, palpitation, chest pain, headaches,or swelling Reports persistent tiredness and dizziness   Plan:  Due to persistent tiredness and heart rate staying in low 50's lets reduce bisoprolol dose from 10 mg daily to 5 mg daily  Continue taking other BP medications same as before  Patient to keep record of BP readings with heart rate and report to [place] at the next visit Patient to see PharmD in 4 weeks for follow up  Follow up lab(s): none

## 2022-11-19 NOTE — Patient Instructions (Signed)
Changes made by your pharmacist Carmela Hurt, PharmD at today's visit:    Instructions/Changes  (what do you need to do) Your Notes  (what you did and when you did it)  Due to persistent tiredness and heart rate staying in low 50's lets reduce bisoprolol dose from 10 mg daily to 5 mg daily    2. Start doing regular exercise - 2 days PT and 1 day at [facility]   3. Eat 3 healthy small meals per day with healthy snacks in between     Bring all of your meds, your BP cuff and your record of home blood pressures to your next appointment.    HOW TO TAKE YOUR BLOOD PRESSURE AT HOME  Rest 5 minutes before taking your blood pressure.  Don't smoke or drink caffeinated beverages for at least 30 minutes before. Take your blood pressure before (not after) you eat. Sit comfortably with your back supported and both feet on the floor (don't cross your legs). Elevate your arm to heart level on a table or a desk. Use the proper sized cuff. It should fit smoothly and snugly around your bare upper arm. There should be enough room to slip a fingertip under the cuff. The bottom edge of the cuff should be 1 inch above the crease of the elbow. Ideally, take 3 measurements at one sitting and record the average.  Important lifestyle changes to control high blood pressure  Intervention  Effect on the BP  Lose extra pounds and watch your waistline Weight loss is one of the most effective lifestyle changes for controlling blood pressure. If you're overweight or obese, losing even a small amount of weight can help reduce blood pressure. Blood pressure might go down by about 1 millimeter of mercury (mm Hg) with each kilogram (about 2.2 pounds) of weight lost.  Exercise regularly As a general goal, aim for at least 30 minutes of moderate physical activity every day. Regular physical activity can lower high blood pressure by about 5 to 8 mm Hg.  Eat a healthy diet Eating a diet rich in whole grains, fruits, vegetables,  and low-fat dairy products and low in saturated fat and cholesterol. A healthy diet can lower high blood pressure by up to 11 mm Hg.  Reduce salt (sodium) in your diet Even a small reduction of sodium in the diet can improve heart health and reduce high blood pressure by about 5 to 6 mm Hg.  Limit alcohol One drink equals 12 ounces of beer, 5 ounces of wine, or 1.5 ounces of 80-proof liquor.  Limiting alcohol to less than one drink a day for women or two drinks a day for men can help lower blood pressure by about 4 mm Hg.   If you have any questions or concerns please use My Chart to send questions or call the office at [phone]

## 2022-11-19 NOTE — Progress Notes (Signed)
Patient ID: Shannon Berger                 DOB: [birth year]                      MRN: [id number]      HPI: Shannon Berger is a 82 y.o. female referred by Dr.Jordan  to HTN clinic. PMH is significant for carotid artery disease, hypertension, hyperlipidemia, ischemic optic neuropathy, and osteopenia.   Patient presented today with her husband for hypertension follow up. Patient have ben checking BP at various time during the day at home on validated arm cuff. Her BP varies at home when she wait sit for few min before checking her BP it ~ 114/58 -128/65 with heart rate 50-53 but when she does not wait her BP ~138-142/68-71 heart rate 53-55. Reports she is tired all the time and feels dizzy and weak some days. No episodes of fall. She does not eat 3 meals per day. Her eating habits needs improvement. However she does watches salt in her food. multiple back surgeries and hip problem affects her exercise capacity. [facility] does 30-45 min cardio at slow pace. She have to have alprazolam to calm her down at night and have good night sleep.    Current HTN meds: amlodipine 2.5 mg in the morning and 5 mg in the evening, bisoprolol 10 mg daily in the evening , olmesartan 40 mg daily in the morning, hydralazine - PRN  Previously tried:  BP goal: <130/80  Family History:  Father: heart problems  Mother: Colon cancer   Social History:  Alcohol: 1 glass of wine once a month  Smoking: never   Diet: low salt diet, skips meal   Exercise:  1-3 times per week 30-45 min cardio    Home BP readings: ~ 114/58 -128/65 with heart rate 50-53 reports dizziness and persistent tiredness  Wt Readings from Last 3 Encounters:  09/26/22 105 lb 3.2 oz (47.7 kg)  09/16/22 106 lb 6.4 oz (48.3 kg)  06/18/22 106 lb (48.1 kg)   BP Readings from Last 3 Encounters:  11/19/22 (!) 140/71  10/03/22 120/82  09/26/22 (!) 173/76  - Pulse Readings from Last 3  Encounters:  11/19/22 (!) 50  10/03/22 68  09/26/22 (!) 49    Renal function: CrCl cannot be calculated (Patient's most recent lab result is older than the maximum 21 days allowed.).  Past Medical History:  Diagnosis Date   Anginal pain (HCC)    occasionally   Anxiety    Arthritis    Bladder cancer (HCC)    Carotid arterial disease (HCC)    Chronic kidney disease    Complication of anesthesia    "hallucinates for days; have a hard time waking up; almost couldn't get her awake last time; wake up wild" (05/26/2018)   Constipation    Degeneration of lumbar intervertebral disc    Dislocation of hip joint prosthesis (HCC)    Essential tremor    Fusion of spine of lumbar region    Heart murmur    "slight one" (05/26/2018)   Hyperlipidemia    Hypertension    Low back pain radiating to left lower extremity    Melanoma (HCC)    "center of upper chest"   Osteoarthritis    Osteopenia    Sleep difficulties    Spinal stenosis at L4-L5 level    Stroke (HCC)  tia--last week   TIA (transient ischemic attack) 05/20/2018    Current Outpatient Medications on File Prior to Visit  Medication Sig Dispense Refill   acetaminophen (TYLENOL) 500 MG tablet Take 500 mg by mouth every 6 (six) hours as needed for moderate pain or headache.     ALPRAZolam (XANAX) 0.5 MG tablet Take 0.25 mg by mouth at bedtime as needed for sleep.      amLODipine (NORVASC) 5 MG tablet Take 0.5 tablets (2.5 mg total) by mouth every morning AND 1 tablet (5 mg total) every evening. 135 tablet 3   Ascorbic Acid (VITAMIN C) 1000 MG tablet Take 1,000 mg by mouth 2 (two) times daily.     aspirin EC 81 MG tablet Take 1 tablet (81 mg total) by mouth daily. Swallow whole. 30 tablet 12   atorvastatin (LIPITOR) 40 MG tablet Take 40 mg by mouth daily.  3   bisoprolol (ZEBETA) 10 MG tablet Take 10 mg by mouth daily.     Calcium Carbonate-Vit D-Min (CALCIUM 1200 PO) Take 1 tablet by mouth daily.     Cholecalciferol (VITAMIN D)  2000 units tablet Take 2,000 Units by mouth daily.      clotrimazole-betamethasone (LOTRISONE) cream Apply 1 Application topically 2 (two) times daily. 30 g 0   denosumab (PROLIA) 60 MG/ML SOSY injection Inject 60 mg into the skin every 6 (six) months.     desvenlafaxine (PRISTIQ) 100 MG 24 hr tablet Take 100 mg by mouth daily.     ezetimibe (ZETIA) 10 MG tablet Take 10 mg by mouth daily.     Multiple Vitamin (MULTIVITAMIN) tablet Take 1 tablet by mouth daily.     olmesartan (BENICAR) 40 MG tablet Take 40 mg by mouth daily.     REPATHA SURECLICK 140 MG/ML SOAJ Inject 1 Syringe into the skin every 14 (fourteen) days.     zinc gluconate 50 MG tablet Take 50 mg by mouth daily.     Current Facility-Administered Medications on File Prior to Visit  Medication Dose Route Frequency Provider Last Rate Last Admin   gemcitabine (GEMZAR) chemo syringe for bladder instillation 2,000 mg  2,000 mg Bladder Instillation Once Winter, Christopher Aaron, MD        Allergies  Allergen Reactions   Oxycodone-Acetaminophen     hallucinations   Versed [Midazolam] Other (See Comments)    Had trouble waking up    Hydrocodone-Acetaminophen     hallucination     Blood pressure (!) 140/71, pulse (!) 50, SpO2 98 %.   Assessment/Plan:  1. Hypertension -  Hypertension Assessment: BP goal <140/90 given advance age can consider lenient BP  goal  In office BP 140/ 71 mmHg with HR 50  at home when she wait and sit for few mins before checking her BP it ~ 114/58 -128/65 with heart rate 50-53 but when she does not wait her BP ~138-142/68-71 heart rate 53-55 Takes current BP medications (amlodipine 2.5 mg in the morning and 5 mg in the evening, bisoprolol 10 mg daily in the evening , olmesartan 40 mg daily in the morning) regularly and tolerates them well  Reiterated importance of being consist in following home BP measurement steps  Denies SOB, palpitation, chest pain, headaches,or swelling Reports persistent  tiredness and dizziness   Plan:  Due to persistent tiredness and heart rate staying in low 50's lets reduce bisoprolol dose from 10 mg daily to 5 mg daily  Continue taking other BP medications same as before  Patient to  keep record of BP readings with heart rate and report to [place] at the next visit Patient to see PharmD in 4 weeks for follow up  Follow up lab(s): none     Thank you  Carmela Hurt, Pharm.D [facility] A Division of [facility] [address], [place], [place] [address]  Phone: [phone]; Fax: [phone]

## 2022-11-20 DIAGNOSIS — M546 Pain in thoracic spine: Secondary | ICD-10-CM | POA: Diagnosis not present

## 2022-11-20 DIAGNOSIS — R531 Weakness: Secondary | ICD-10-CM | POA: Diagnosis not present

## 2022-11-20 DIAGNOSIS — M25559 Pain in unspecified hip: Secondary | ICD-10-CM | POA: Diagnosis not present

## 2022-11-20 DIAGNOSIS — M545 Low back pain, unspecified: Secondary | ICD-10-CM | POA: Diagnosis not present

## 2022-11-21 DIAGNOSIS — H903 Sensorineural hearing loss, bilateral: Secondary | ICD-10-CM | POA: Diagnosis not present

## 2022-11-26 DIAGNOSIS — M25559 Pain in unspecified hip: Secondary | ICD-10-CM | POA: Diagnosis not present

## 2022-11-26 DIAGNOSIS — M546 Pain in thoracic spine: Secondary | ICD-10-CM | POA: Diagnosis not present

## 2022-11-26 DIAGNOSIS — R531 Weakness: Secondary | ICD-10-CM | POA: Diagnosis not present

## 2022-11-26 DIAGNOSIS — M545 Low back pain, unspecified: Secondary | ICD-10-CM | POA: Diagnosis not present

## 2022-12-04 ENCOUNTER — Other Ambulatory Visit (HOSPITAL_COMMUNITY): Payer: Self-pay | Admitting: *Deleted

## 2022-12-04 DIAGNOSIS — M546 Pain in thoracic spine: Secondary | ICD-10-CM | POA: Diagnosis not present

## 2022-12-04 DIAGNOSIS — R531 Weakness: Secondary | ICD-10-CM | POA: Diagnosis not present

## 2022-12-04 DIAGNOSIS — M545 Low back pain, unspecified: Secondary | ICD-10-CM | POA: Diagnosis not present

## 2022-12-04 DIAGNOSIS — M25559 Pain in unspecified hip: Secondary | ICD-10-CM | POA: Diagnosis not present

## 2022-12-06 ENCOUNTER — Ambulatory Visit (HOSPITAL_COMMUNITY)
Admission: RE | Admit: 2022-12-06 | Discharge: 2022-12-06 | Disposition: A | Discharge status: Home / Self Care | Payer: Medicare Other | Source: Ambulatory Visit | Attending: Internal Medicine | Admitting: Internal Medicine

## 2022-12-06 DIAGNOSIS — M810 Age-related osteoporosis without current pathological fracture: Secondary | ICD-10-CM | POA: Insufficient documentation

## 2022-12-06 MED ORDER — DENOSUMAB 60 MG/ML ~~LOC~~ SOSY
PREFILLED_SYRINGE | SUBCUTANEOUS | Status: AC
  Filled 2022-12-06: qty 1

## 2022-12-06 MED ORDER — DENOSUMAB 60 MG/ML ~~LOC~~ SOSY
60.0000 mg | PREFILLED_SYRINGE | Freq: Once | SUBCUTANEOUS | Status: AC
  Administered 2022-12-06: 60 mg via SUBCUTANEOUS

## 2022-12-09 DIAGNOSIS — M545 Low back pain, unspecified: Secondary | ICD-10-CM | POA: Diagnosis not present

## 2022-12-09 DIAGNOSIS — N3000 Acute cystitis without hematuria: Secondary | ICD-10-CM | POA: Diagnosis not present

## 2022-12-09 DIAGNOSIS — M546 Pain in thoracic spine: Secondary | ICD-10-CM | POA: Diagnosis not present

## 2022-12-09 DIAGNOSIS — N390 Urinary tract infection, site not specified: Secondary | ICD-10-CM | POA: Diagnosis not present

## 2022-12-09 DIAGNOSIS — R531 Weakness: Secondary | ICD-10-CM | POA: Diagnosis not present

## 2022-12-09 DIAGNOSIS — C671 Malignant neoplasm of dome of bladder: Secondary | ICD-10-CM | POA: Diagnosis not present

## 2022-12-09 DIAGNOSIS — M25559 Pain in unspecified hip: Secondary | ICD-10-CM | POA: Diagnosis not present

## 2022-12-11 DIAGNOSIS — M25559 Pain in unspecified hip: Secondary | ICD-10-CM | POA: Diagnosis not present

## 2022-12-11 DIAGNOSIS — M545 Low back pain, unspecified: Secondary | ICD-10-CM | POA: Diagnosis not present

## 2022-12-11 DIAGNOSIS — R531 Weakness: Secondary | ICD-10-CM | POA: Diagnosis not present

## 2022-12-11 DIAGNOSIS — M546 Pain in thoracic spine: Secondary | ICD-10-CM | POA: Diagnosis not present

## 2022-12-18 DIAGNOSIS — M545 Low back pain, unspecified: Secondary | ICD-10-CM | POA: Diagnosis not present

## 2022-12-18 DIAGNOSIS — M546 Pain in thoracic spine: Secondary | ICD-10-CM | POA: Diagnosis not present

## 2022-12-18 DIAGNOSIS — M25559 Pain in unspecified hip: Secondary | ICD-10-CM | POA: Diagnosis not present

## 2022-12-18 DIAGNOSIS — R531 Weakness: Secondary | ICD-10-CM | POA: Diagnosis not present

## 2022-12-20 DIAGNOSIS — R531 Weakness: Secondary | ICD-10-CM | POA: Diagnosis not present

## 2022-12-20 DIAGNOSIS — M546 Pain in thoracic spine: Secondary | ICD-10-CM | POA: Diagnosis not present

## 2022-12-20 DIAGNOSIS — M25559 Pain in unspecified hip: Secondary | ICD-10-CM | POA: Diagnosis not present

## 2022-12-20 DIAGNOSIS — M545 Low back pain, unspecified: Secondary | ICD-10-CM | POA: Diagnosis not present

## 2022-12-24 DIAGNOSIS — Z5111 Encounter for antineoplastic chemotherapy: Secondary | ICD-10-CM | POA: Diagnosis not present

## 2022-12-24 DIAGNOSIS — C671 Malignant neoplasm of dome of bladder: Secondary | ICD-10-CM | POA: Diagnosis not present

## 2022-12-25 ENCOUNTER — Encounter: Payer: Self-pay | Admitting: Student

## 2022-12-25 ENCOUNTER — Ambulatory Visit: Payer: Medicare Other | Attending: Internal Medicine | Admitting: Student

## 2022-12-25 VITALS — BP 128/53 | HR 51

## 2022-12-25 DIAGNOSIS — I10 Essential (primary) hypertension: Secondary | ICD-10-CM | POA: Diagnosis not present

## 2022-12-25 NOTE — Patient Instructions (Addendum)
Changes made by your pharmacist Carmela Hurt, PharmD at today's visit:    Instructions/Changes  (what do you need to do) Your Notes  (what you did and when you did it)  Stop taking bisoprolol and continue taking amlodipine 2.5 mg in the morning and 5 mg in the evening, olmesartan 40 mg daily in the morning   2. Cut down on eating out drink 6-8 glasses of water per day     Bring all of your meds, your BP cuff and your record of home blood pressures to your next appointment.    HOW TO TAKE YOUR BLOOD PRESSURE AT HOME  Rest 5 minutes before taking your blood pressure.  Don't smoke or drink caffeinated beverages for at least 30 minutes before. Take your blood pressure before (not after) you eat. Sit comfortably with your back supported and both feet on the floor (don't cross your legs). Elevate your arm to heart level on a table or a desk. Use the proper sized cuff. It should fit smoothly and snugly around your bare upper arm. There should be enough room to slip a fingertip under the cuff. The bottom edge of the cuff should be 1 inch above the crease of the elbow. Ideally, take 3 measurements at one sitting and record the average.  Important lifestyle changes to control high blood pressure  Intervention  Effect on the BP  Lose extra pounds and watch your waistline Weight loss is one of the most effective lifestyle changes for controlling blood pressure. If you're overweight or obese, losing even a small amount of weight can help reduce blood pressure. Blood pressure might go down by about 1 millimeter of mercury (mm Hg) with each kilogram (about 2.2 pounds) of weight lost.  Exercise regularly As a general goal, aim for at least 30 minutes of moderate physical activity every day. Regular physical activity can lower high blood pressure by about 5 to 8 mm Hg.  Eat a healthy diet Eating a diet rich in whole grains, fruits, vegetables, and low-fat dairy products and low in saturated fat and  cholesterol. A healthy diet can lower high blood pressure by up to 11 mm Hg.  Reduce salt (sodium) in your diet Even a small reduction of sodium in the diet can improve heart health and reduce high blood pressure by about 5 to 6 mm Hg.  Limit alcohol One drink equals 12 ounces of beer, 5 ounces of wine, or 1.5 ounces of 80-proof liquor.  Limiting alcohol to less than one drink a day for women or two drinks a day for men can help lower blood pressure by about 4 mm Hg.   If you have any questions or concerns please use My Chart to send questions or call the office at [phone]

## 2022-12-25 NOTE — Assessment & Plan Note (Signed)
Assessment: BP goal <140/90, given advance age can consider lenient BP goal  In office BP 128/53 with heart rate 51 Home BP ~130/65 with heart rate in low 50's Feels tired and sleepy during the day, have been taking bisoprolol half tablet of 10 mg from last 4 week still her heart rate ~ low 50s reasonable to hold betablocker, if needed can add mix beta-alpha blocker in future - carvedilol  Denies SOB, palpitation, chest pain, headaches, dizziness,or swelling  Plan:  Stop taking bisoprolol  Continue taking other BP medications (olmesartan 40 mg daily and amlodipine 2.5 mg in the morning and 5 mg in the evening) same as before  Patient to keep record of BP readings with heart rate and report to [place] at the next visit Patient to see PharmD in 4 weeks for follow up  Follow up lab(s): none

## 2022-12-25 NOTE — Progress Notes (Signed)
Patient ID: Shannon Berger                 DOB: 06-14-1941                      MRN: [id number]      HPI: Shannon Berger is a 82 y.o. female referred by Dr.Jordan  to HTN clinic. PMH is significant for carotid artery disease, hypertension, hyperlipidemia, ischemic optic neuropathy, and osteopenia,Degeneration of lumbar intervertebral disc,Sleep difficulties, Spinal stenosis at L4-L5 level, Stroke (HCC) or  TIA (transient ischemic attack) (05/20/2018).  Patient presented today with her husband for hypertension follow up. Brought in home BP log ~130/65 with HR low 50's. She has been taking only 1/2 tab of 10 mg bisoprolol. Have not notice much difference in heart rate or tiredness. Due to chronic back problem can't stand for long period of time and can't cook at home so eats out  3 times per week. Denies dizziness, SOB, chest pain, palpitation or swelling     Current HTN meds: amlodipine 2.5 mg in the morning and 5 mg in the evening, bisoprolol 5 mg daily in the evening , olmesartan 40 mg daily in the morning Previously tried: HCTZ- too low BP  BP goal: <130/80 CrCl: 27 mL/min (06/2022)  Family History:  Father: heart problems  Mother: Colon cancer    Social History:  Alcohol: 1 glass of wine once a month  Smoking: never   Diet: low salt diet, skips meal, eats out 3 meals per week.  Exercise:  1-3 times per week 30-45 min cardio    Home BP readings: ~ 114/58 -128/65 with heart rate 50-53 reports dizziness and persistent tiredness  Wt Readings from Last 3 Encounters:  09/26/22 105 lb 3.2 oz (47.7 kg)  09/16/22 106 lb 6.4 oz (48.3 kg)  06/18/22 106 lb (48.1 kg)   BP Readings from Last 3 Encounters:  12/25/22 (!) 128/53  12/06/22 (!) 151/50  11/19/22 (!) 140/71  - Pulse Readings from Last 3 Encounters:  12/25/22 (!) 51  12/06/22 88  11/19/22 (!) 50    Renal function: CrCl cannot be calculated (Patient's most recent lab result is older than the maximum 21 days  allowed.).  Past Medical History:  Diagnosis Date   Anginal pain (HCC)    occasionally   Anxiety    Arthritis    Bladder cancer (HCC)    Carotid arterial disease (HCC)    Chronic kidney disease    Complication of anesthesia    "hallucinates for days; have a hard time waking up; almost couldn't get her awake last time; wake up wild" (05/26/2018)   Constipation    Degeneration of lumbar intervertebral disc    Dislocation of hip joint prosthesis (HCC)    Essential tremor    Fusion of spine of lumbar region    Heart murmur    "slight one" (05/26/2018)   Hyperlipidemia    Hypertension    Low back pain radiating to left lower extremity    Melanoma (HCC)    "center of upper chest"   Osteoarthritis    Osteopenia    Sleep difficulties    Spinal stenosis at L4-L5 level    Stroke [facility])    tia--last week   TIA (transient ischemic attack) 05/20/2018    Current Outpatient Medications on File Prior to Visit  Medication Sig Dispense Refill   amLODipine (NORVASC) 5 MG tablet Take 0.5 tablets (2.5 mg total) by mouth every morning  AND 1 tablet (5 mg total) every evening. 135 tablet 3   acetaminophen (TYLENOL) 500 MG tablet Take 500 mg by mouth every 6 (six) hours as needed for moderate pain or headache.     ALPRAZolam (XANAX) 0.5 MG tablet Take 0.25 mg by mouth at bedtime as needed for sleep.      Ascorbic Acid (VITAMIN C) 1000 MG tablet Take 1,000 mg by mouth 2 (two) times daily.     aspirin EC 81 MG tablet Take 1 tablet (81 mg total) by mouth daily. Swallow whole. 30 tablet 12   atorvastatin (LIPITOR) 40 MG tablet Take 40 mg by mouth daily.  3   Calcium Carbonate-Vit D-Min (CALCIUM 1200 PO) Take 1 tablet by mouth daily.     Cholecalciferol (VITAMIN D) 2000 units tablet Take 2,000 Units by mouth daily.      clotrimazole-betamethasone (LOTRISONE) cream Apply 1 Application topically 2 (two) times daily. 30 g 0   denosumab (PROLIA) 60 MG/ML SOSY injection Inject 60 mg into the skin every 6  (six) months.     desvenlafaxine (PRISTIQ) 100 MG 24 hr tablet Take 100 mg by mouth daily.     ezetimibe (ZETIA) 10 MG tablet Take 10 mg by mouth daily.     Multiple Vitamin (MULTIVITAMIN) tablet Take 1 tablet by mouth daily.     olmesartan (BENICAR) 40 MG tablet Take 40 mg by mouth daily.     REPATHA SURECLICK 140 MG/ML SOAJ Inject 1 Syringe into the skin every 14 (fourteen) days.     zinc gluconate 50 MG tablet Take 50 mg by mouth daily.     Current Facility-Administered Medications on File Prior to Visit  Medication Dose Route Frequency Provider Last Rate Last Admin   gemcitabine (GEMZAR) chemo syringe for bladder instillation 2,000 mg  2,000 mg Bladder Instillation Once Winter, Christopher Aaron, MD        Allergies  Allergen Reactions   Oxycodone-Acetaminophen     hallucinations   Versed [Midazolam] Other (See Comments)    Had trouble waking up    Hydrocodone-Acetaminophen     hallucination     Blood pressure (!) 128/53, pulse (!) 51.   Hypertension Assessment: BP goal <140/90, given advance age can consider lenient BP goal  In office BP 128/53 with heart rate 51 Home BP ~130/65 with heart rate in low 50's Feels tired and sleepy during the day, have been taking bisoprolol half tablet of 10 mg from last 4 week still her heart rate ~ low 50s reasonable to hold betablocker, if needed can add mix beta-alpha blocker in future - carvedilol  Denies SOB, palpitation, chest pain, headaches, dizziness,or swelling  Plan:  Stop taking bisoprolol  Continue taking other BP medications (olmesartan 40 mg daily and amlodipine 2.5 mg in the morning and 5 mg in the evening) same as before  Patient to keep record of BP readings with heart rate and report to [place] at the next visit Patient to see PharmD in 4 weeks for follow up  Follow up lab(s): none     Thank you  Carmela Hurt, Pharm.D [facility] A Division of [facility] [address], [place], [place]  [address]  Phone: [phone]; Fax: [phone]

## 2022-12-26 DIAGNOSIS — Z471 Aftercare following joint replacement surgery: Secondary | ICD-10-CM | POA: Diagnosis not present

## 2022-12-26 DIAGNOSIS — Z96642 Presence of left artificial hip joint: Secondary | ICD-10-CM | POA: Diagnosis not present

## 2022-12-30 DIAGNOSIS — M546 Pain in thoracic spine: Secondary | ICD-10-CM | POA: Diagnosis not present

## 2022-12-30 DIAGNOSIS — R531 Weakness: Secondary | ICD-10-CM | POA: Diagnosis not present

## 2022-12-30 DIAGNOSIS — M545 Low back pain, unspecified: Secondary | ICD-10-CM | POA: Diagnosis not present

## 2022-12-30 DIAGNOSIS — M25559 Pain in unspecified hip: Secondary | ICD-10-CM | POA: Diagnosis not present

## 2022-12-31 DIAGNOSIS — Z5111 Encounter for antineoplastic chemotherapy: Secondary | ICD-10-CM | POA: Diagnosis not present

## 2022-12-31 DIAGNOSIS — C671 Malignant neoplasm of dome of bladder: Secondary | ICD-10-CM | POA: Diagnosis not present

## 2023-01-01 DIAGNOSIS — R531 Weakness: Secondary | ICD-10-CM | POA: Diagnosis not present

## 2023-01-01 DIAGNOSIS — M545 Low back pain, unspecified: Secondary | ICD-10-CM | POA: Diagnosis not present

## 2023-01-01 DIAGNOSIS — M546 Pain in thoracic spine: Secondary | ICD-10-CM | POA: Diagnosis not present

## 2023-01-01 DIAGNOSIS — M25559 Pain in unspecified hip: Secondary | ICD-10-CM | POA: Diagnosis not present

## 2023-01-06 DIAGNOSIS — M25559 Pain in unspecified hip: Secondary | ICD-10-CM | POA: Diagnosis not present

## 2023-01-06 DIAGNOSIS — M545 Low back pain, unspecified: Secondary | ICD-10-CM | POA: Diagnosis not present

## 2023-01-06 DIAGNOSIS — R531 Weakness: Secondary | ICD-10-CM | POA: Diagnosis not present

## 2023-01-06 DIAGNOSIS — M546 Pain in thoracic spine: Secondary | ICD-10-CM | POA: Diagnosis not present

## 2023-01-07 DIAGNOSIS — Z5111 Encounter for antineoplastic chemotherapy: Secondary | ICD-10-CM | POA: Diagnosis not present

## 2023-01-07 DIAGNOSIS — C671 Malignant neoplasm of dome of bladder: Secondary | ICD-10-CM | POA: Diagnosis not present

## 2023-01-08 DIAGNOSIS — M546 Pain in thoracic spine: Secondary | ICD-10-CM | POA: Diagnosis not present

## 2023-01-08 DIAGNOSIS — M419 Scoliosis, unspecified: Secondary | ICD-10-CM | POA: Diagnosis not present

## 2023-01-08 DIAGNOSIS — R531 Weakness: Secondary | ICD-10-CM | POA: Diagnosis not present

## 2023-01-08 DIAGNOSIS — M25559 Pain in unspecified hip: Secondary | ICD-10-CM | POA: Diagnosis not present

## 2023-01-13 DIAGNOSIS — R531 Weakness: Secondary | ICD-10-CM | POA: Diagnosis not present

## 2023-01-13 DIAGNOSIS — M419 Scoliosis, unspecified: Secondary | ICD-10-CM | POA: Diagnosis not present

## 2023-01-13 DIAGNOSIS — M25559 Pain in unspecified hip: Secondary | ICD-10-CM | POA: Diagnosis not present

## 2023-01-13 DIAGNOSIS — M546 Pain in thoracic spine: Secondary | ICD-10-CM | POA: Diagnosis not present

## 2023-01-14 DIAGNOSIS — Z5111 Encounter for antineoplastic chemotherapy: Secondary | ICD-10-CM | POA: Diagnosis not present

## 2023-01-14 DIAGNOSIS — C671 Malignant neoplasm of dome of bladder: Secondary | ICD-10-CM | POA: Diagnosis not present

## 2023-01-15 DIAGNOSIS — M419 Scoliosis, unspecified: Secondary | ICD-10-CM | POA: Diagnosis not present

## 2023-01-15 DIAGNOSIS — R531 Weakness: Secondary | ICD-10-CM | POA: Diagnosis not present

## 2023-01-15 DIAGNOSIS — M25559 Pain in unspecified hip: Secondary | ICD-10-CM | POA: Diagnosis not present

## 2023-01-15 DIAGNOSIS — M546 Pain in thoracic spine: Secondary | ICD-10-CM | POA: Diagnosis not present

## 2023-01-21 DIAGNOSIS — Z5111 Encounter for antineoplastic chemotherapy: Secondary | ICD-10-CM | POA: Diagnosis not present

## 2023-01-21 DIAGNOSIS — C671 Malignant neoplasm of dome of bladder: Secondary | ICD-10-CM | POA: Diagnosis not present

## 2023-01-22 ENCOUNTER — Telehealth: Payer: Self-pay | Admitting: Cardiology

## 2023-01-22 DIAGNOSIS — M419 Scoliosis, unspecified: Secondary | ICD-10-CM | POA: Diagnosis not present

## 2023-01-22 DIAGNOSIS — M25559 Pain in unspecified hip: Secondary | ICD-10-CM | POA: Diagnosis not present

## 2023-01-22 DIAGNOSIS — R531 Weakness: Secondary | ICD-10-CM | POA: Diagnosis not present

## 2023-01-22 DIAGNOSIS — M546 Pain in thoracic spine: Secondary | ICD-10-CM | POA: Diagnosis not present

## 2023-01-22 NOTE — Telephone Encounter (Signed)
Pt c/o BP issue: STAT if pt c/o blurred vision, one-sided weakness or slurred speech  1. What are your last 5 BP readings? Systolic was 179  2. Are you having any other symptoms (ex. Dizziness, headache, blurred vision, passed out)? No   3. What is your BP issue? Hypertension. PT advised her to call and report. She had just found out her husband had to get cancer surgery and was very upset at the time, but was still advised to report today and not wait until tomorrows appt.

## 2023-01-22 NOTE — Telephone Encounter (Signed)
She reports that she just re-checked and her BP= 164/91 hr=114.   Pt reports that she is still very upset, she is trying her best to relax/take it easy- but she is nervous/up-tight about the recent news of her husband needing surgery due to Cancer. She has an appt tomorrow with PharmD for HTN. She denies having any symptoms at this time.   Instructed her to try her best to relax and take it easy. Instructed her to recheck her BP in a few hours to see if it continues to "trend- down." Gave her ER precautions. She verbalized understanding. Informed her that I would send this information to her provider and will be in touch with any recommendations.

## 2023-01-23 ENCOUNTER — Ambulatory Visit: Payer: Medicare Other | Attending: Internal Medicine | Admitting: Student

## 2023-01-23 ENCOUNTER — Encounter: Payer: Self-pay | Admitting: Student

## 2023-01-23 VITALS — BP 117/70 | HR 82

## 2023-01-23 DIAGNOSIS — I10 Essential (primary) hypertension: Secondary | ICD-10-CM | POA: Diagnosis not present

## 2023-01-23 NOTE — Assessment & Plan Note (Addendum)
Assessment: BP is controlled in office BP 117/70 heart rate 82 (goal <130/80). Home BP varies, below goal most days only high when she is stressed or anxious  Tolerates current BP mediation well without any side effects Denies SOB, palpitation, chest pain, headaches,or swelling Reiterated the importance of regular exercise and low salt diet   Plan:  No medication change  Continue taking amlodipine 2.5 mg in the morning and 5 mg in the evening, bisoprolol 5 mg daily in the evening , olmesartan 40 mg daily in the morning In future if home BP remains in lower range with dizzy spells may lower amlodipine evening dose to 2.5 mg   Patient to keep record of BP readings with heart rate and report to [place] at the next visit Patient to see PharmD in 4 weeks for follow up  Follow up lab(s):none

## 2023-01-23 NOTE — Progress Notes (Signed)
Patient ID: Shannon Berger                 DOB: [birth year]                      MRN: [id number]      HPI: Shannon Berger is a 82 y.o. female referred by Dr.Jordan  to HTN clinic. PMH is significant for carotid artery disease, hypertension, hyperlipidemia, ischemic optic neuropathy, and osteopenia,Degeneration of lumbar intervertebral disc,Sleep difficulties, Spinal stenosis at L4-L5 level, Stroke (HCC) or  TIA (transient ischemic attack) (05/20/2018).  Patient presented today with her husband for hypertension follow up. Brought in home BP log ~120/70 with HR 60-70 range. She thinks he memory declining but she does not want to get assessed if  she has alzheimer or dementia it will double her anxiety. She went to PT session yesterday and her SBP was 179. She was very stressed yesterday. She worries a lot and that does not helps her with her BP. Couple weeks ago her BP was too low 108/64 and she did not feel well. She was dizzy so she skipped her morning BP medications. Her appetite is declining so she does not eat some days and her water intake is inconsistent too. Denies persistent dizziness, SOB, chest pain, palpitation or swelling     Current HTN meds: amlodipine 2.5 mg in the morning and 5 mg in the evening, bisoprolol 5 mg daily in the evening , olmesartan 40 mg daily in the morning Previously tried: HCTZ- too low BP  BP goal: <130/80 CrCl: 27 mL/min (06/2022)  Family History:  Father: heart problems  Mother: Colon cancer    Social History:  Alcohol: 1 glass of wine once a month  Smoking: never   Diet: low salt diet, skips meal, eats out 3 meals per week.  Exercise:  1-3 times per week 30-45 min cardio    Home BP readings: ~ 114/58 -128/65 with heart rate 50-53 reports dizziness and persistent tiredness  Wt Readings from Last 3 Encounters:  09/26/22 105 lb 3.2 oz (47.7 kg)  09/16/22 106 lb 6.4 oz (48.3 kg)  06/18/22 106 lb (48.1 kg)   BP Readings from Last 3  Encounters:  12/25/22 (!) 128/53  12/06/22 (!) 151/50  11/19/22 (!) 140/71  - Pulse Readings from Last 3 Encounters:  12/25/22 (!) 51  12/06/22 88  11/19/22 (!) 50    Renal function: CrCl cannot be calculated (Patient's most recent lab result is older than the maximum 21 days allowed.).  Past Medical History:  Diagnosis Date   Anginal pain (HCC)    occasionally   Anxiety    Arthritis    Bladder cancer (HCC)    Carotid arterial disease (HCC)    Chronic kidney disease    Complication of anesthesia    "hallucinates for days; have a hard time waking up; almost couldn't get her awake last time; wake up wild" (05/26/2018)   Constipation    Degeneration of lumbar intervertebral disc    Dislocation of hip joint prosthesis (HCC)    Essential tremor    Fusion of spine of lumbar region    Heart murmur    "slight one" (05/26/2018)   Hyperlipidemia    Hypertension    Low back pain radiating to left lower extremity    Melanoma (HCC)    "center of upper chest"   Osteoarthritis    Osteopenia    Sleep difficulties    Spinal stenosis at  L4-L5 level    Stroke (HCC)    tia--last week   TIA (transient ischemic attack) 05/20/2018    Current Outpatient Medications on File Prior to Visit  Medication Sig Dispense Refill   acetaminophen (TYLENOL) 500 MG tablet Take 500 mg by mouth every 6 (six) hours as needed for moderate pain or headache.     ALPRAZolam (XANAX) 0.5 MG tablet Take 0.25 mg by mouth at bedtime as needed for sleep.      amLODipine (NORVASC) 5 MG tablet Take 0.5 tablets (2.5 mg total) by mouth every morning AND 1 tablet (5 mg total) every evening. 135 tablet 3   Ascorbic Acid (VITAMIN C) 1000 MG tablet Take 1,000 mg by mouth 2 (two) times daily.     aspirin EC 81 MG tablet Take 1 tablet (81 mg total) by mouth daily. Swallow whole. 30 tablet 12   atorvastatin (LIPITOR) 40 MG tablet Take 40 mg by mouth daily.  3   Calcium Carbonate-Vit D-Min (CALCIUM 1200 PO) Take 1 tablet by  mouth daily.     Cholecalciferol (VITAMIN D) 2000 units tablet Take 2,000 Units by mouth daily.      clotrimazole-betamethasone (LOTRISONE) cream Apply 1 Application topically 2 (two) times daily. 30 g 0   denosumab (PROLIA) 60 MG/ML SOSY injection Inject 60 mg into the skin every 6 (six) months.     desvenlafaxine (PRISTIQ) 100 MG 24 hr tablet Take 100 mg by mouth daily.     ezetimibe (ZETIA) 10 MG tablet Take 10 mg by mouth daily.     Multiple Vitamin (MULTIVITAMIN) tablet Take 1 tablet by mouth daily.     olmesartan (BENICAR) 40 MG tablet Take 40 mg by mouth daily.     REPATHA SURECLICK 140 MG/ML SOAJ Inject 1 Syringe into the skin every 14 (fourteen) days.     zinc gluconate 50 MG tablet Take 50 mg by mouth daily.     Current Facility-Administered Medications on File Prior to Visit  Medication Dose Route Frequency Provider Last Rate Last Admin   gemcitabine (GEMZAR) chemo syringe for bladder instillation 2,000 mg  2,000 mg Bladder Instillation Once Winter, Christopher Aaron, MD        Allergies  Allergen Reactions   Oxycodone-Acetaminophen     hallucinations   Versed [Midazolam] Other (See Comments)    Had trouble waking up    Hydrocodone-Acetaminophen     hallucination     There were no vitals taken for this visit.   No problem-specific Assessment & Plan notes found for this encounter.    Thank you  Carmela Hurt, Pharm.D [facility] A Division of [facility] [address] [place], [place] [address]  Phone: [phone]; Fax: [phone]

## 2023-01-29 ENCOUNTER — Telehealth: Payer: Self-pay | Admitting: Cardiology

## 2023-01-29 DIAGNOSIS — M546 Pain in thoracic spine: Secondary | ICD-10-CM | POA: Diagnosis not present

## 2023-01-29 DIAGNOSIS — R531 Weakness: Secondary | ICD-10-CM | POA: Diagnosis not present

## 2023-01-29 DIAGNOSIS — M419 Scoliosis, unspecified: Secondary | ICD-10-CM | POA: Diagnosis not present

## 2023-01-29 DIAGNOSIS — M25559 Pain in unspecified hip: Secondary | ICD-10-CM | POA: Diagnosis not present

## 2023-01-29 NOTE — Telephone Encounter (Signed)
Spoke to patient she stated she does not remember calling last week.Stated she saw Pharm D 7/18 about elevated B/P.Advised to keep appointment already scheduled with Pharm D 8/12 at 3:00 pm.

## 2023-01-29 NOTE — Telephone Encounter (Signed)
Already spoke to patient see previous 7/24 telephone note.

## 2023-01-29 NOTE — Telephone Encounter (Signed)
Patient stated she is returning call directly to [facility].

## 2023-01-29 NOTE — Telephone Encounter (Signed)
Called patient left message on personal voice mail to call back. 

## 2023-01-30 ENCOUNTER — Telehealth: Payer: Self-pay | Admitting: Cardiology

## 2023-01-30 NOTE — Telephone Encounter (Signed)
Patient wanted RN Elnita Maxwell to know she saw her PA.

## 2023-01-30 NOTE — Telephone Encounter (Signed)
Spoke to patient she stated she remembers calling last week.Stated she saw physical therapist and her B/P was elevated.Stated he insisted she call to report.Stated she saw Pharm D the next day.Stated she monitoring her B/P daily and will bring readings to Pharm D appointment 8/12 at 3:00 pm.

## 2023-02-03 DIAGNOSIS — M419 Scoliosis, unspecified: Secondary | ICD-10-CM | POA: Diagnosis not present

## 2023-02-03 DIAGNOSIS — R531 Weakness: Secondary | ICD-10-CM | POA: Diagnosis not present

## 2023-02-03 DIAGNOSIS — M25559 Pain in unspecified hip: Secondary | ICD-10-CM | POA: Diagnosis not present

## 2023-02-03 DIAGNOSIS — M546 Pain in thoracic spine: Secondary | ICD-10-CM | POA: Diagnosis not present

## 2023-02-04 DIAGNOSIS — Z5111 Encounter for antineoplastic chemotherapy: Secondary | ICD-10-CM | POA: Diagnosis not present

## 2023-02-04 DIAGNOSIS — C671 Malignant neoplasm of dome of bladder: Secondary | ICD-10-CM | POA: Diagnosis not present

## 2023-02-05 ENCOUNTER — Telehealth: Payer: Self-pay | Admitting: Cardiology

## 2023-02-05 DIAGNOSIS — M419 Scoliosis, unspecified: Secondary | ICD-10-CM | POA: Diagnosis not present

## 2023-02-05 DIAGNOSIS — R531 Weakness: Secondary | ICD-10-CM | POA: Diagnosis not present

## 2023-02-05 DIAGNOSIS — M25559 Pain in unspecified hip: Secondary | ICD-10-CM | POA: Diagnosis not present

## 2023-02-05 DIAGNOSIS — M546 Pain in thoracic spine: Secondary | ICD-10-CM | POA: Diagnosis not present

## 2023-02-05 NOTE — Telephone Encounter (Signed)
Pt returning nurse's call. Please advise

## 2023-02-05 NOTE — Telephone Encounter (Signed)
Left voicemail for patient to return call to office. 

## 2023-02-05 NOTE — Telephone Encounter (Signed)
Patient stated that yesterday when she was getting out of the car she felt tired and like she was going to faint. When she got in the house to check her BP it was 91/48 hr 73. Did advise when her BP is low and can cause fatigue. Also to advise to stay hydrated in the heat. This morning her BP was 100/63 hr 71 and 109/65 hr 71 advised to hold BP medications this morning. Continue to monitor BP and HR. Try changing positions slowly. Discussed ED precautions. Will forward to provider.

## 2023-02-05 NOTE — Telephone Encounter (Signed)
Pt c/o BP issue: STAT if pt c/o blurred vision, one-sided weakness or slurred speech  1. What are your last 5 BP readings?  07/29 - 116/66 HR 68 07/30 - 135/70 HR 65, 91/48 HR 73,  124/73 HR 76 07/31- 100/63 71 & 111/65 HR 71    2. Are you having any other symptoms (ex. Dizziness, headache, blurred vision, passed out)? Tired   3. What is your BP issue? Patient is requesting call back from [place], LPN in regards to her readings. Please advise.

## 2023-02-05 NOTE — Telephone Encounter (Signed)
Patient is aware to decrease amlodipine to 2.5 mg in the evening. She repeated instructions back to me. She verbalized understanding

## 2023-02-10 DIAGNOSIS — M419 Scoliosis, unspecified: Secondary | ICD-10-CM | POA: Diagnosis not present

## 2023-02-10 DIAGNOSIS — M546 Pain in thoracic spine: Secondary | ICD-10-CM | POA: Diagnosis not present

## 2023-02-10 DIAGNOSIS — M25559 Pain in unspecified hip: Secondary | ICD-10-CM | POA: Diagnosis not present

## 2023-02-10 DIAGNOSIS — R531 Weakness: Secondary | ICD-10-CM | POA: Diagnosis not present

## 2023-02-12 ENCOUNTER — Other Ambulatory Visit: Payer: Self-pay

## 2023-02-12 ENCOUNTER — Emergency Department (HOSPITAL_COMMUNITY): Payer: Medicare Other

## 2023-02-12 ENCOUNTER — Encounter (HOSPITAL_COMMUNITY): Payer: Self-pay | Admitting: Emergency Medicine

## 2023-02-12 ENCOUNTER — Emergency Department (HOSPITAL_COMMUNITY)
Admission: EM | Admit: 2023-02-12 | Discharge: 2023-02-12 | Disposition: A | Discharge status: Home / Self Care | Payer: Medicare Other | Attending: Emergency Medicine | Admitting: Emergency Medicine

## 2023-02-12 DIAGNOSIS — I129 Hypertensive chronic kidney disease with stage 1 through stage 4 chronic kidney disease, or unspecified chronic kidney disease: Secondary | ICD-10-CM | POA: Insufficient documentation

## 2023-02-12 DIAGNOSIS — Z8551 Personal history of malignant neoplasm of bladder: Secondary | ICD-10-CM | POA: Diagnosis not present

## 2023-02-12 DIAGNOSIS — Z79899 Other long term (current) drug therapy: Secondary | ICD-10-CM | POA: Insufficient documentation

## 2023-02-12 DIAGNOSIS — Z8673 Personal history of transient ischemic attack (TIA), and cerebral infarction without residual deficits: Secondary | ICD-10-CM | POA: Insufficient documentation

## 2023-02-12 DIAGNOSIS — H539 Unspecified visual disturbance: Secondary | ICD-10-CM | POA: Diagnosis not present

## 2023-02-12 DIAGNOSIS — I6523 Occlusion and stenosis of bilateral carotid arteries: Secondary | ICD-10-CM | POA: Diagnosis not present

## 2023-02-12 DIAGNOSIS — R531 Weakness: Secondary | ICD-10-CM | POA: Insufficient documentation

## 2023-02-12 DIAGNOSIS — M546 Pain in thoracic spine: Secondary | ICD-10-CM | POA: Diagnosis not present

## 2023-02-12 DIAGNOSIS — N189 Chronic kidney disease, unspecified: Secondary | ICD-10-CM | POA: Diagnosis not present

## 2023-02-12 DIAGNOSIS — M25559 Pain in unspecified hip: Secondary | ICD-10-CM | POA: Diagnosis not present

## 2023-02-12 DIAGNOSIS — Z7982 Long term (current) use of aspirin: Secondary | ICD-10-CM | POA: Insufficient documentation

## 2023-02-12 DIAGNOSIS — I672 Cerebral atherosclerosis: Secondary | ICD-10-CM | POA: Diagnosis not present

## 2023-02-12 DIAGNOSIS — M419 Scoliosis, unspecified: Secondary | ICD-10-CM | POA: Diagnosis not present

## 2023-02-12 DIAGNOSIS — R29818 Other symptoms and signs involving the nervous system: Secondary | ICD-10-CM | POA: Diagnosis not present

## 2023-02-12 DIAGNOSIS — I700 Atherosclerosis of aorta: Secondary | ICD-10-CM | POA: Diagnosis not present

## 2023-02-12 LAB — CBC WITH DIFFERENTIAL/PLATELET
Abs Immature Granulocytes: 0.01 10*3/uL (ref 0.00–0.07)
Basophils Absolute: 0 10*3/uL (ref 0.0–0.1)
Basophils Relative: 1 %
Eosinophils Absolute: 0.1 10*3/uL (ref 0.0–0.5)
Eosinophils Relative: 2 %
HCT: 40.1 % (ref 36.0–46.0)
Hemoglobin: 13.2 g/dL (ref 12.0–15.0)
Immature Granulocytes: 0 %
Lymphocytes Relative: 16 %
Lymphs Abs: 1.1 10*3/uL (ref 0.7–4.0)
MCH: 33.3 pg (ref 26.0–34.0)
MCHC: 32.9 g/dL (ref 30.0–36.0)
MCV: 101.3 fL — ABNORMAL HIGH (ref 80.0–100.0)
Monocytes Absolute: 0.5 10*3/uL (ref 0.1–1.0)
Monocytes Relative: 8 %
Neutro Abs: 4.9 10*3/uL (ref 1.7–7.7)
Neutrophils Relative %: 73 %
Platelets: 282 10*3/uL (ref 150–400)
RBC: 3.96 MIL/uL (ref 3.87–5.11)
RDW: 12.1 % (ref 11.5–15.5)
WBC: 6.8 10*3/uL (ref 4.0–10.5)
nRBC: 0 % (ref 0.0–0.2)

## 2023-02-12 LAB — URINALYSIS, ROUTINE W REFLEX MICROSCOPIC
Bacteria, UA: NONE SEEN
Bilirubin Urine: NEGATIVE
Glucose, UA: NEGATIVE mg/dL
Hgb urine dipstick: NEGATIVE
Ketones, ur: NEGATIVE mg/dL
Nitrite: NEGATIVE
Protein, ur: NEGATIVE mg/dL
Specific Gravity, Urine: 1.015 (ref 1.005–1.030)
pH: 6 (ref 5.0–8.0)

## 2023-02-12 LAB — COMPREHENSIVE METABOLIC PANEL
ALT: 26 U/L (ref 0–44)
AST: 41 U/L (ref 15–41)
Albumin: 4.1 g/dL (ref 3.5–5.0)
Alkaline Phosphatase: 77 U/L (ref 38–126)
Anion gap: 8 (ref 5–15)
BUN: 35 mg/dL — ABNORMAL HIGH (ref 8–23)
CO2: 28 mmol/L (ref 22–32)
Calcium: 10.1 mg/dL (ref 8.9–10.3)
Chloride: 103 mmol/L (ref 98–111)
Creatinine, Ser: 1.44 mg/dL — ABNORMAL HIGH (ref 0.44–1.00)
GFR, Estimated: 37 mL/min — ABNORMAL LOW (ref >60)
Glucose, Bld: 108 mg/dL — ABNORMAL HIGH (ref 70–99)
Potassium: 4 mmol/L (ref 3.5–5.1)
Sodium: 139 mmol/L (ref 135–145)
Total Bilirubin: 0.7 mg/dL (ref 0.3–1.2)
Total Protein: 7.3 g/dL (ref 6.5–8.1)

## 2023-02-12 MED ORDER — IOHEXOL 350 MG/ML SOLN
65.0000 mL | Freq: Once | INTRAVENOUS | Status: AC | PRN
  Administered 2023-02-12: 65 mL via INTRAVENOUS

## 2023-02-12 NOTE — ED Provider Triage Note (Signed)
Emergency Medicine Provider Triage Evaluation Note  Shannon Berger , a 82 y.o. female  was evaluated in triage.  Pt complains of feeling "off" starting at 245 PM today. States she feels "spacey" and off. Hx of carotid arterial dz and TIAs. Denies any weakness, numbness, tingling, difficulty with speech or swallowing.   Review of Systems  Positive: "Feels off" Negative: Speech changes  Physical Exam  BP (!) 174/91   Pulse 91   Temp 98.1 F (36.7 C) (Oral)   Resp 16   SpO2 100%  Gen:   Awake, no distress   Resp:  Normal effort  MSK:   Moves extremities without difficulty  Other:  No neurodeficits, EOMI (of her one eye, other is artificial), 5/5 strength  Medical Decision Making  Medically screening exam initiated at 4:59 PM.  Appropriate orders placed.  DAYJA QUARTERMAN was informed that the remainder of the evaluation will be completed by another provider, this initial triage assessment does not replace that evaluation, and the importance of remaining in the ED until their evaluation is complete.  Discussed with Dr. Derry Lory, given no obvious deficits does not meet criteria for thrombectomy or TNK thus no need for code stroke. Appreciate his recs.   Pete Pelt, [place] 02/12/23 1702

## 2023-02-12 NOTE — ED Provider Notes (Signed)
I saw and evaluated the patient, reviewed the resident's note and I agree with the findings and plan.  EKG Interpretation Date/Time:  Wednesday February 12 2023 16:19:26 EDT Ventricular Rate:  97 PR Interval:  160 QRS Duration:  68 QT Interval:  330 QTC Calculation: 419 R Axis:   88  Text Interpretation: Normal sinus rhythm Normal ECG When compared with ECG of 05-Feb-2021 22:10, PREVIOUS ECG IS PRESENT No significant change since last tracing Confirmed by Lorre Nick ([id number]) on 02/12/2023 7:56:87 PM   82 year old female presents with several month history of memory issues as well as visual changes.  Patient scheduled to be seen by an ophthalmologist at [facility].  Family states that the memory issues have been for several months.  Also has history of anxiety and has been on Wellbutrin for about a week or so.  States that she feels extremely anxious which is confirmed by her family.  On exam, she has no focal deficits.  Will check brain imaging and likely discharge back to home   Lorre Nick, MD 02/12/23 2057

## 2023-02-12 NOTE — ED Provider Notes (Signed)
Shannon Berger EMERGENCY DEPARTMENT AT [facility] Provider Note   CSN: [id number] Arrival date & time: 02/12/23  1631  History Past Medical History:  Diagnosis Date   Anginal pain (HCC)    occasionally   Anxiety    Arthritis    Bladder cancer (HCC)    Carotid arterial disease (HCC)    Chronic kidney disease    Complication of anesthesia    "hallucinates for days; have a hard time waking up; almost couldn't get her awake last time; wake up wild" (05/26/2018)   Constipation    Degeneration of lumbar intervertebral disc    Dislocation of hip joint prosthesis (HCC)    Essential tremor    Fusion of spine of lumbar region    Heart murmur    "slight one" (05/26/2018)   Hyperlipidemia    Hypertension    Low back pain radiating to left lower extremity    Melanoma (HCC)    "center of upper chest"   Osteoarthritis    Osteopenia    Sleep difficulties    Spinal stenosis at L4-L5 level    Stroke [facility])    tia--last week   TIA (transient ischemic attack) 05/20/2018   Chief Complaint  Patient presents with   Vision Changes    Shannon Berger is a 82 y.o. female.  Shannon Berger is an 82 year old female presented to the ED for altered cognition and vision changes.  For the past few days, the patient has noted feeling off.  She has become more forgetful compared to her baseline.  For the past month, she is also noted vision changes in which she describes intermittent episodes of the words on the page becoming "smoky" and unable to be read.  She noted an episode today of feeling mentally off while eating which brought her to the emergency department.  She denies any vision loss, weakness, dysarthria, chest pain, shortness of breath, or other signs or symptoms.  The history is provided by the patient and the spouse.     Home Medications Prior to Admission medications   Medication Sig Start Date End Date Taking? Authorizing Provider  acetaminophen (TYLENOL) 500 MG  tablet Take 500 mg by mouth every 6 (six) hours as needed for moderate pain or headache.    [provider]  ALPRAZolam Prudy Feeler) 0.5 MG tablet Take 0.25 mg by mouth at bedtime as needed for sleep.  06/08/19   [provider]  amLODipine (NORVASC) 5 MG tablet Take 0.5 tablets (2.5 mg total) by mouth every morning AND 1 tablet (5 mg total) every evening. 09/16/22   [place], [name], MD  Ascorbic Acid (VITAMIN C) 1000 MG tablet Take 1,000 mg by mouth 2 (two) times daily.    [provider]  aspirin EC 81 MG tablet Take 1 tablet (81 mg total) by mouth daily. Swallow whole. 02/21/22   [place], [name], MD  atorvastatin (LIPITOR) 40 MG tablet Take 40 mg by mouth daily. 01/29/15   [provider]  Calcium Carbonate-Vit D-Min (CALCIUM 1200 PO) Take 1 tablet by mouth daily.    [provider]  Cholecalciferol (VITAMIN D) 2000 units tablet Take 2,000 Units by mouth daily.     [provider]  clotrimazole-betamethasone (LOTRISONE) cream Apply 1 Application topically 2 (two) times daily. 10/31/22   Candelaria Stagers, DPM  denosumab (PROLIA) 60 MG/ML SOSY injection Inject 60 mg into the skin every 6 (six) months.    [provider]  desvenlafaxine (PRISTIQ) 100  MG 24 hr tablet Take 100 mg by mouth daily.    [provider]  ezetimibe (ZETIA) 10 MG tablet Take 10 mg by mouth daily.    [provider]  Multiple Vitamin (MULTIVITAMIN) tablet Take 1 tablet by mouth daily.    [provider]  olmesartan (BENICAR) 40 MG tablet Take 40 mg by mouth daily. 03/15/20   [provider]  REPATHA SURECLICK 140 MG/ML SOAJ Inject 1 Syringe into the skin every 14 (fourteen) days. 07/11/20   [provider]  tretinoin (RETIN-A) 0.05 % cream Apply 1 Application topically at bedtime. Apply a pea sized amount to face    [provider]  zinc gluconate 50 MG tablet Take 50 mg by mouth daily.    [provider]       Allergies    Oxycodone-acetaminophen, Versed [midazolam], and Hydrocodone-acetaminophen    Review of Systems   Review of Systems  Constitutional: Negative.   HENT: Negative.    Eyes:  Positive for visual disturbance.       Describes the words on the page as smoky and unable to be read  Respiratory: Negative.    Cardiovascular: Negative.   Gastrointestinal: Negative.   Endocrine: Negative.   Genitourinary: Negative.   Musculoskeletal:  Positive for gait problem.       Issues with balance  Skin: Negative.   Allergic/Immunologic: Negative.   Hematological: Negative.   Psychiatric/Behavioral:  Positive for decreased concentration. The patient is nervous/anxious.        Forgetfulness    Physical Exam Updated Vital Signs BP (!) 153/81 (BP Location: Right Arm)   Pulse 72   Temp 98 F (36.7 C) (Oral)   Resp 18   Ht 4\' 11"  (1.499 m)   Wt 48 kg   SpO2 100%   BMI 21.37 kg/m  Physical Exam HENT:     Head: Normocephalic and atraumatic.  Cardiovascular:     Rate and Rhythm: Normal rate and regular rhythm.  Pulmonary:     Effort: Pulmonary effort is normal.     Breath sounds: Normal breath sounds.  Skin:    General: Skin is warm.  Neurological:     General: No focal deficit present.     Mental Status: She is alert and oriented to person, place, and time.  Psychiatric:        Mood and Affect: Mood and affect normal.        Behavior: Behavior is cooperative.    ED Results / Procedures / Treatments   Labs (all labs ordered are listed, but only abnormal results are displayed) Labs Reviewed  CBC WITH DIFFERENTIAL/PLATELET - Abnormal; Notable for the following components:      Result Value   MCV 101.3 (*)    All other components within normal limits  COMPREHENSIVE METABOLIC PANEL - Abnormal; Notable for the following components:   Glucose, Bld 108 (*)    BUN 35 (*)    Creatinine, Ser 1.44 (*)    GFR, Estimated 37 (*)    All other components within normal limits   URINALYSIS, ROUTINE W REFLEX MICROSCOPIC - Abnormal; Notable for the following components:   Color, Urine STRAW (*)    Leukocytes,Ua TRACE (*)    All other components within normal limits    EKG EKG Interpretation Date/Time:  Wednesday February 12 2023 16:19:26 EDT Ventricular Rate:  97 PR Interval:  160 QRS Duration:  68 QT Interval:  330 QTC Calculation: 419 R Axis:   88  Text Interpretation: Normal sinus rhythm Normal ECG When compared with ECG of 05-Feb-2021 22:10, PREVIOUS ECG IS PRESENT No significant change since last tracing Confirmed by Lorre Nick ([id number]) on 02/12/2023 7:10:37 PM  Radiology CT ANGIO HEAD NECK W WO CM  Result Date: 02/12/2023 CLINICAL DATA:  Acute neurologic deficit EXAM: CT ANGIOGRAPHY HEAD AND NECK WITH AND WITHOUT CONTRAST TECHNIQUE: Multidetector CT imaging of the head and neck was performed using the standard protocol during bolus administration of intravenous contrast. Multiplanar CT image reconstructions and MIPs were obtained to evaluate the vascular anatomy. Carotid stenosis measurements (when applicable) are obtained utilizing NASCET criteria, using the distal internal carotid diameter as the denominator. RADIATION DOSE REDUCTION: This exam was performed according to the departmental dose-optimization program which includes automated exposure control, adjustment of the mA and/or kV according to patient size and/or use of iterative reconstruction technique. CONTRAST:  65mL OMNIPAQUE IOHEXOL 350 MG/ML SOLN COMPARISON:  05/21/2018 FINDINGS: CT HEAD FINDINGS Brain: There is no mass, hemorrhage or extra-axial collection. The size and configuration of the ventricles and extra-axial CSF spaces are normal. There is no acute or chronic infarction. There is hypoattenuation of the periventricular white matter, most commonly indicating chronic ischemic microangiopathy. Skull: The visualized skull base, calvarium and extracranial soft tissues are normal. Sinuses/Orbits: No  fluid levels or advanced mucosal thickening of the visualized paranasal sinuses. No mastoid or middle ear effusion. The orbits are normal. CTA NECK FINDINGS SKELETON: There is no bony spinal canal stenosis. No lytic or blastic lesion. OTHER NECK: Normal pharynx, larynx and major salivary glands. No cervical lymphadenopathy. Unremarkable thyroid gland. UPPER CHEST: No pneumothorax or pleural effusion. No nodules or masses. AORTIC ARCH: There is calcific atherosclerosis of the aortic arch. Conventional 3 vessel aortic branching pattern. RIGHT CAROTID SYSTEM: No dissection, occlusion or aneurysm. There is mixed density atherosclerosis extending into the proximal ICA, resulting in less than 50% stenosis. LEFT CAROTID SYSTEM: No dissection, occlusion or aneurysm. Mild atherosclerotic calcification at the carotid bifurcation without hemodynamically significant stenosis. VERTEBRAL ARTERIES: Left dominant configuration.There is no dissection, occlusion or flow-limiting stenosis to the skull base (V1-V3 segments). CTA HEAD FINDINGS POSTERIOR CIRCULATION: --Vertebral arteries: Normal V4 segments. --Inferior cerebellar arteries: Normal. --Basilar artery: Normal. --Superior cerebellar arteries: Normal. --Posterior cerebral arteries (PCA): Normal. ANTERIOR CIRCULATION: --Intracranial internal carotid arteries: Atherosclerotic calcification of the internal carotid arteries at the skull base without hemodynamically significant stenosis. --Anterior cerebral arteries (ACA): Normal. Both A1 segments are present. Patent anterior communicating artery (a-comm). --Middle cerebral arteries (MCA): Normal. VENOUS SINUSES: As permitted by contrast timing, patent. ANATOMIC VARIANTS: None Review of the MIP images confirms the above findings. IMPRESSION: 1. No emergent large vessel occlusion or hemodynamically significant stenosis of the head or neck. 2. Bilateral carotid bifurcation atherosclerosis without hemodynamically significant stenosis.  3. Chronic ischemic microangiopathy. Aortic Atherosclerosis (ICD10-I70.0). Electronically Signed   By: Deatra Robinson M.D.   On: 02/12/2023 21:05   DG Chest 2 View  Result Date: 02/12/2023 CLINICAL DATA:  Weakness EXAM: CHEST - 2 VIEW COMPARISON:  12/26/2015 and CT 04/12/2022 FINDINGS: The heart size and mediastinal contours are within normal limits. No pneumothorax or pleural effusion. Both lungs are clear. Upper lumbar spine fixation hardware noted. There is severe compression deformity of T11 which is unchanged compared to the CT scan from October 2023. IMPRESSION: No active cardiopulmonary disease. Electronically Signed   By: Layla Maw M.D.   On: 02/12/2023 18:05    Medications Ordered in ED Medications  iohexol (OMNIPAQUE) 350 MG/ML injection 65 mL (  65 mLs Intravenous Contrast Given 02/12/23 2044)    ED Course/ Medical Decision Making/ A&P Patient presented with history of intermittent vision changes, memory issues, and weakness for the past month.  Chest x-ray demonstrated no active cardiopulmonary disease.  CTA showed no emergent large vessel occlusion or hemodynamically significant stenosis of the head or neck.  Neurological examination demonstrated no focal deficits.  UA, CMP, and CBC demonstrated no significant acute changes.  In the context of these findings, TIA or stroke unlikely.  Current symptoms likely multifactorial possibly secondary to recent medication adjustments, anxiety, and age-related changes.                               Medical Decision Making Amount and/or Complexity of Data Reviewed Independent Historian: spouse External Data Reviewed: labs. Radiology: ordered. Decision-making details documented in ED Course.   Final Clinical Impression(s) / ED Diagnoses Final diagnoses:  Weakness    Rx / DC Orders ED Discharge Orders     None         Morrie Sheldon, MD 02/12/23 2151    Lorre Nick, MD 02/13/23 2246

## 2023-02-12 NOTE — ED Triage Notes (Addendum)
Pt comes to the ED reporting "not feeling well" since this morning. Reports feels "off"  Had physical therapy today and felt worse after that.  Pt cannot isolate any one particular feeling. Does report vision changes while reading in the past "months"  Pt and spouse report hx of strokes and carotid artery surgery and are concerned she is having a stroke today.   Neuro intact at time of triage.

## 2023-02-13 DIAGNOSIS — Z961 Presence of intraocular lens: Secondary | ICD-10-CM | POA: Diagnosis not present

## 2023-02-13 DIAGNOSIS — H47011 Ischemic optic neuropathy, right eye: Secondary | ICD-10-CM | POA: Diagnosis not present

## 2023-02-13 DIAGNOSIS — H35371 Puckering of macula, right eye: Secondary | ICD-10-CM | POA: Diagnosis not present

## 2023-02-17 ENCOUNTER — Ambulatory Visit: Payer: Medicare Other

## 2023-02-17 DIAGNOSIS — M25559 Pain in unspecified hip: Secondary | ICD-10-CM | POA: Diagnosis not present

## 2023-02-17 DIAGNOSIS — M419 Scoliosis, unspecified: Secondary | ICD-10-CM | POA: Diagnosis not present

## 2023-02-17 DIAGNOSIS — M546 Pain in thoracic spine: Secondary | ICD-10-CM | POA: Diagnosis not present

## 2023-02-17 DIAGNOSIS — R531 Weakness: Secondary | ICD-10-CM | POA: Diagnosis not present

## 2023-02-18 ENCOUNTER — Ambulatory Visit: Payer: Medicare Other

## 2023-02-19 ENCOUNTER — Telehealth: Payer: Self-pay

## 2023-02-19 DIAGNOSIS — M546 Pain in thoracic spine: Secondary | ICD-10-CM | POA: Diagnosis not present

## 2023-02-19 DIAGNOSIS — M25559 Pain in unspecified hip: Secondary | ICD-10-CM | POA: Diagnosis not present

## 2023-02-19 DIAGNOSIS — R531 Weakness: Secondary | ICD-10-CM | POA: Diagnosis not present

## 2023-02-19 DIAGNOSIS — M419 Scoliosis, unspecified: Secondary | ICD-10-CM | POA: Diagnosis not present

## 2023-02-19 NOTE — Telephone Encounter (Signed)
Transition Care Management Unsuccessful Follow-up Telephone Call  Date of discharge and from where:  02/12/2023 [facility]  Attempts:  1st Attempt  Reason for unsuccessful TCM follow-up call:  No answer/busy   [facility]  [facility] Population Health Community Resource Care Guide   ??[email or web address]  ?? [phone]   Website: [email or web address]  [email or web address]

## 2023-02-20 ENCOUNTER — Telehealth: Payer: Self-pay

## 2023-02-20 NOTE — Telephone Encounter (Signed)
Transition Care Management Follow-up Telephone Call Date of discharge and from where: 02/12/2023 [facility] How have you been since you were released from the hospital? Patient stated she is feeling much better. Any questions or concerns? No  Items Reviewed: Did the pt receive and understand the discharge instructions provided? Yes  Medications obtained and verified?  No medication prescribed Other? No  Any new allergies since your discharge? No  Dietary orders reviewed? Yes Do you have support at home? Yes   Follow up appointments reviewed:  PCP Hospital f/u appt confirmed? No  Scheduled to see  on  @ . Specialist Hospital f/u appt confirmed? Yes  Scheduled to see Darel Hong, Audiology on 02/20/2023 @ [facility]. Are transportation arrangements needed?  Mailing [facility] to patient's home address. If their condition worsens, is the pt aware to call PCP or go to the Emergency Dept.? Yes Was the patient provided with contact information for the PCP's office or ED? Yes Was to pt encouraged to call back with questions or concerns? Yes   [facility]  [facility] Population Health Community Resource Care Guide   [email or web address]  ?? [phone]   Website: [email or web address]  [email or web address]

## 2023-02-26 DIAGNOSIS — M25559 Pain in unspecified hip: Secondary | ICD-10-CM | POA: Diagnosis not present

## 2023-02-26 DIAGNOSIS — M419 Scoliosis, unspecified: Secondary | ICD-10-CM | POA: Diagnosis not present

## 2023-02-26 DIAGNOSIS — R531 Weakness: Secondary | ICD-10-CM | POA: Diagnosis not present

## 2023-02-26 DIAGNOSIS — M546 Pain in thoracic spine: Secondary | ICD-10-CM | POA: Diagnosis not present

## 2023-02-27 ENCOUNTER — Ambulatory Visit: Payer: Medicare Other

## 2023-02-28 DIAGNOSIS — M546 Pain in thoracic spine: Secondary | ICD-10-CM | POA: Diagnosis not present

## 2023-02-28 DIAGNOSIS — R531 Weakness: Secondary | ICD-10-CM | POA: Diagnosis not present

## 2023-02-28 DIAGNOSIS — M25559 Pain in unspecified hip: Secondary | ICD-10-CM | POA: Diagnosis not present

## 2023-02-28 DIAGNOSIS — M419 Scoliosis, unspecified: Secondary | ICD-10-CM | POA: Diagnosis not present

## 2023-03-03 DIAGNOSIS — M419 Scoliosis, unspecified: Secondary | ICD-10-CM | POA: Diagnosis not present

## 2023-03-03 DIAGNOSIS — M25559 Pain in unspecified hip: Secondary | ICD-10-CM | POA: Diagnosis not present

## 2023-03-03 DIAGNOSIS — R531 Weakness: Secondary | ICD-10-CM | POA: Diagnosis not present

## 2023-03-03 DIAGNOSIS — M546 Pain in thoracic spine: Secondary | ICD-10-CM | POA: Diagnosis not present

## 2023-03-06 DIAGNOSIS — R531 Weakness: Secondary | ICD-10-CM | POA: Diagnosis not present

## 2023-03-06 DIAGNOSIS — M546 Pain in thoracic spine: Secondary | ICD-10-CM | POA: Diagnosis not present

## 2023-03-06 DIAGNOSIS — M25559 Pain in unspecified hip: Secondary | ICD-10-CM | POA: Diagnosis not present

## 2023-03-06 DIAGNOSIS — M419 Scoliosis, unspecified: Secondary | ICD-10-CM | POA: Diagnosis not present

## 2023-03-17 DIAGNOSIS — C672 Malignant neoplasm of lateral wall of bladder: Secondary | ICD-10-CM | POA: Diagnosis not present

## 2023-03-18 DIAGNOSIS — M25559 Pain in unspecified hip: Secondary | ICD-10-CM | POA: Diagnosis not present

## 2023-03-18 DIAGNOSIS — R531 Weakness: Secondary | ICD-10-CM | POA: Diagnosis not present

## 2023-03-18 DIAGNOSIS — M419 Scoliosis, unspecified: Secondary | ICD-10-CM | POA: Diagnosis not present

## 2023-03-18 DIAGNOSIS — M546 Pain in thoracic spine: Secondary | ICD-10-CM | POA: Diagnosis not present

## 2023-03-18 NOTE — Progress Notes (Signed)
Cardiology Office Note    Date:  03/21/2023   ID:  Shannon Berger [birth year], MRN [id number]  PCP:  Shannon Ran, MD  Cardiologist:  Shannon. [place]  Chief Complaint  Patient presents with   Coronary Artery Disease   Hypertension    History of Present Illness:  Shannon Berger is a 82 y.o. female is seen for follow up dyspnea. She has a  past medical history of carotid artery disease, hypertension, hyperlipidemia, ischemic optic neuropathy, and osteopenia.  She was seen in 2016 for evaluation of chest pain.  She was complaining of chest pain not related to exertion at the time.  She had a similar symptom in the past with normal stress echo in 2012 and normal ETT in 2008.  She eventually underwent a Myoview on 03/24/2015 which showed EF 68%, normal perfusion, overall low risk study.     She was seen by Shannon Berger in October 2019 for symptoms of dyspnea on exertion. She had evaluation with an Echo showing normal LV systolic and valve function. Grade 2 diastolic dysfunction. Coronary CTA was done showing calcium score of 264 but nonobstructive CAD. It was felt that her scoliosis may be contributing to her dyspnea. She reported having PFTs but no results available for [place] to review.  In November 2019 she was admitted with aphasia and left sided weakness. CT and MRI showed no acute findings but carotid dopplers showed progression of carotid arterial disease with > 80% left carotid stenosis. She subsequently underwent left CEA by Shannon Berger on 05/26/18 without complications. 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 This has been asymptomatic so managed conservatively.   She was seen in February 2023 by Shannon Berger for atypical chest pain for which she was evaluated at [facility] ED. In June she had recurrent chest pain and after  discussion with DOD Shannon Berger it was decided to  schedule her for coronary CTA. Unfortunately she had a coronary calcium score ordered instead which really wasn't helpful. To evaluate further we did do a coronary CTA. This again showed nonobstructive CAD.   When seen previously she was getting low BP readings to 80 systolic. Was symptomatic. We stopped her lasix and potassium. She states she quit taking hydralazine as well.  She was followed by Shannon Berger in HTN clinic for adjustment in BP medication.   She was seen in the ED in early August. Felt unwell and "spacey". Labs including UA, CBC, CMET stable. CXR clear. ECG was normal. CT angio head and neck showed chronic microangiopathy, no acute change. No significant vascular obstruction.   She states she does feel like her legs getting "rubbery" at times and she has to sit down. BP readings at home fluctuate but look good. Range 109-157 systolic. Diastolics normal. She is scheduled for cystoscopy with bladder tumor removal next week.   Past Medical History:  Diagnosis Date   Anginal pain (HCC)    occasionally   Anxiety    Arthritis    Bladder cancer (HCC)    Carotid arterial disease (HCC)    Chronic kidney disease    Complication of anesthesia    "hallucinates for days; have a hard time waking up; almost couldn't get her awake last time; wake up wild" (05/26/2018)   Constipation    Degeneration of lumbar intervertebral disc    Dislocation of hip joint prosthesis (HCC)  Essential tremor    Fusion of spine of lumbar region    Heart murmur    "slight one" (05/26/2018)   Hyperlipidemia    Hypertension    Low back pain radiating to left lower extremity    Melanoma (HCC)    "center of upper chest"   Osteoarthritis    Osteopenia    Sleep difficulties    Spinal stenosis at L4-L5 level    Stroke [facility])    tia--last week   TIA (transient ischemic attack) 05/20/2018    Past Surgical History:  Procedure Laterality Date   ANTERIOR LUMBAR FUSION     added rods and screws;   BACK  SURGERY     BLADDER SURGERY     BREAST BIOPSY Left    "not cancer"   BUNIONECTOMY Left    CAROTID ENDARTERECTOMY Left 05/26/2018   CATARACT EXTRACTION W/ INTRAOCULAR LENS  IMPLANT, BILATERAL Bilateral    ENDARTERECTOMY Left 05/26/2018   Procedure: ENDARTERECTOMY CAROTID LEFT;  Surgeon: Shannon Hint, MD;  Location: [facility] OR;  Service: Vascular;  Laterality: Left;   EYE SURGERY Right    "retina wrinkle OR; now can't see out of that eye" (05/26/2018)   JOINT REPLACEMENT Left 2014   Hip   LUMBAR SPINE HARDWARE REMOVAL     "went in thru the front"   MELANOMA EXCISION     "center of upper chest"   PATCH ANGIOPLASTY Left 05/26/2018   Procedure: PATCH ANGIOPLASTY LEFT CAROTID ARTERY;  Surgeon: Shannon Hint, MD;  Location: [facility] OR;  Service: Vascular;  Laterality: Left;   PILONIDAL CYST DRAINAGE     POSTERIOR LUMBAR FUSION  X 2   TENDON REPAIR Left    Reattachment of tendon- Left hip   THUMB FUSION Right    TONSILLECTOMY AND ADENOIDECTOMY     TOTAL HIP ARTHROPLASTY Left 09/04/2012   Procedure: LEFT TOTAL HIP ARTHROPLASTY ANTERIOR APPROACH;  Surgeon: Kathryne Hitch, MD;  Location: [facility] ORS;  Service: Orthopedics;  Laterality: Left;  Left Total Hip Arthroplasty, Anterior Approach   TRANSURETHRAL RESECTION OF BLADDER TUMOR N/A 06/06/2020   Procedure: TRANSURETHRAL RESECTION OF BLADDER TUMOR(TURBT) WITH CYSTOSCOPY/ INSTILLATION OF GEMCITABINE WITH RIGHT STENT PLACEMENT;  Surgeon: Rene Paci, MD;  Location: [facility] ORS;  Service: Urology;  Laterality: N/A;    Current Medications: Outpatient Medications Prior to Visit  Medication Sig Dispense Refill   acetaminophen (TYLENOL) 500 MG tablet Take 500 mg by mouth every 6 (six) hours as needed for moderate pain or headache.     ALPRAZolam (XANAX) 0.5 MG tablet Take 0.25-0.5 mg by mouth at bedtime as needed for sleep or anxiety.     amLODipine (NORVASC) 5 MG tablet Take 0.5 tablets (2.5 mg total) by mouth every morning  AND 1 tablet (5 mg total) every evening. 135 tablet 3   Ascorbic Acid (VITAMIN C) 1000 MG tablet Take 1,000 mg by mouth every evening.     aspirin 325 MG tablet Take 325 mg by mouth at bedtime. Swallow whole. 30 tablet 12   atorvastatin (LIPITOR) 40 MG tablet Take 40 mg by mouth at bedtime.  3   buPROPion (WELLBUTRIN XL) 150 MG 24 hr tablet Take 150 mg by mouth every morning.     Calcium Carbonate-Vit Berger-Min (CALCIUM 1200 PO) Take 1 tablet by mouth in the morning.     Cholecalciferol (VITAMIN Berger) 125 MCG (5000 UT) CAPS Take 5,000 Units by mouth every evening.     Cyanocobalamin (VITAMIN B-12) 5000 MCG TBDP Take  by mouth at bedtime.     denosumab (PROLIA) 60 MG/ML SOSY injection Inject 60 mg into the skin every 6 (six) months.     desvenlafaxine (PRISTIQ) 100 MG 24 hr tablet Take 100 mg by mouth in the morning.     ezetimibe (ZETIA) 10 MG tablet Take 10 mg by mouth at bedtime.     lamoTRIgine (LAMICTAL) 150 MG tablet Take 150 mg by mouth at bedtime.     Multiple Vitamin (MULTIVITAMIN) tablet Take 1 tablet by mouth in the morning.     olmesartan (BENICAR) 40 MG tablet Take 40 mg by mouth in the morning.     Omega-3 Fatty Acids (FISH OIL PO) Take 1 capsule by mouth in the morning and at bedtime.     REPATHA SURECLICK 140 MG/ML SOAJ Inject 1 Syringe into the skin every 14 (fourteen) days.     tretinoin (RETIN-A) 0.05 % cream Apply 1 Application topically daily as needed (skin blemishes.). Apply a pea sized amount to face     zinc gluconate 50 MG tablet Take 50 mg by mouth at bedtime.     Facility-Administered Medications Prior to Visit  Medication Dose Route Frequency Provider Last Rate Last Admin   gemcitabine (GEMZAR) chemo syringe for bladder instillation 2,000 mg  2,000 mg Bladder Instillation Once Winter, Dorian Furnace, MD         Allergies:   Oxycodone-acetaminophen, Versed [midazolam], and Hydrocodone-acetaminophen   Social History   Socioeconomic History   Marital status: Married     Spouse name: jerry   Number of children: 2   Years of education: Not on file   Highest education level: Bachelor's degree (e.g., BA, AB, BS)  Occupational History   Occupation: Research officer, political party: RETIRED    Comment: retired  Tobacco Use   Smoking status: Never   Smokeless tobacco: Never  Vaping Use   Vaping status: Never Used  Substance and Sexual Activity   Alcohol use: Yes    Comment: 1 a month   Drug use: Never   Sexual activity: Not Currently    Partners: Male    Birth control/protection: Post-menopausal    Comment: 1st intercourse- 23, partners- 1  Other Topics Concern   Not on file  Social History Narrative   Pt lives at home with spouse jerry, 2 story home has 2 children   She is right handed, she drinks ginger ale and come tea, no coffee, or soda   Social Determinants of Health   Financial Resource Strain: Not on file  Food Insecurity: Not on file  Transportation Needs: No Transportation Needs (02/20/2023)   PRAPARE - Administrator, Civil Service (Medical): No    Lack of Transportation (Non-Medical): No  Physical Activity: Not on file  Stress: Not on file  Social Connections: Not on file     Family History:  The patient's family history includes Cancer in her brother and mother; Diabetes in her father; Healthy in her son; Heart attack in her father; Heart disease in her father; Heart failure in her mother; Hypertension in her father and mother; Migraines in her daughter; Other in her mother.   ROS:   Please see the history of present illness.    ROS All other systems reviewed and are negative.   PHYSICAL EXAM:   VS:  BP 98/68   Pulse 85   Ht 5' 1.5" (1.562 m)   Wt 106 lb 9.6 oz (48.4 kg)   SpO2 97%  BMI 19.82 kg/m    GEN: Well nourished, well developed, in no acute distress  HEENT: normal  Neck: no JVD, carotid bruits, or masses Cardiac: RRR; no murmurs, rubs, or gallops,no edema  Respiratory:  clear to auscultation  bilaterally, normal work of breathing GI: soft, nontender, nondistended, + BS MS: no deformity or atrophy  Skin: warm and dry, no rash Neuro:  Alert and Oriented x 3, Strength and sensation are intact Psych: euthymic mood, full affect  Wt Readings from Last 3 Encounters:  03/21/23 106 lb 9.6 oz (48.4 kg)  03/21/23 102 lb (46.3 kg)  02/12/23 105 lb 13.1 oz (48 kg)      Studies/Labs Reviewed:    Recent Labs: 02/12/2023: ALT 26 03/21/2023: BUN 39; Creatinine, Ser 1.36; Hemoglobin 12.8; Platelets 277; Potassium 4.6; Sodium 136   Lipid Panel    Component Value Date/Time   CHOL 174 05/21/2018 0412   TRIG 59 05/21/2018 0412   HDL 71 05/21/2018 0412   CHOLHDL 2.5 05/21/2018 0412   VLDL 12 05/21/2018 0412   LDLCALC 91 05/21/2018 0412   Dated 04/20/21: creatinine 1.43. otherwise CMET and CBC normal. Dated 06/15/21: cholesterol 110, triglycerides 41, HDL 75, LDL 27.  Dated 07/16/22: cholesterol 123, triglycerides 53, HDL 85, LDL 27. BUN 41, creatinine 1.2. otherwise CMET and TSH normal.        Additional studies/ records that were reviewed today include:   Myoview 03/24/2015 Study Highlights   The left ventricular ejection fraction is hyperdynamic (>65%). Nuclear stress EF: 68%. There was no ST segment deviation noted during stress. The study is normal. This is a low risk study. Normal myocardial perfusion and function   Low risk lexiscan nuclear stress test demonstrating normal myocardial perfusion and function; EF 68%,   Echo 05/21/18: Study Conclusions   - Left ventricle: The cavity size was normal. Wall thickness was   normal. Systolic function was normal. The estimated ejection   fraction was in the range of 60% to 65%. Wall motion was normal;   there were no regional wall motion abnormalities. Features are   consistent with a pseudonormal left ventricular filling pattern,   with concomitant abnormal relaxation and increased filling   pressure (grade 2 diastolic  dysfunction). - Mitral valve: There was mild regurgitation. - Pulmonary arteries: Systolic pressure was mildly increased. PA   peak pressure: 34 mm Hg (S).  ADDENDUM REPORT: 06/01/2018 22:20   CLINICAL DATA:  Chest pain   EXAM: Cardiac CTA   MEDICATIONS: Sub lingual nitro. 4mg  x 2   TECHNIQUE: The patient was scanned on a Siemens 192 slice scanner. Gantry rotation speed was 250 msecs. Collimation was 0.6 mm. A 100 kV prospective scan was triggered in the ascending thoracic aorta at 35-75% of the R-R interval. Average HR during the scan was 60 bpm. The 3D data set was interpreted on a dedicated work station using MPR, MIP and VRT modes. A total of 80cc of contrast was used.   FINDINGS: Non-cardiac: See separate report from [facility].   The pulmonary veins drained normally to the left atrium.   Calcium Score: 260 Agatston units.   Coronary Arteries: Right dominant with no anomalies   LM: No plaque or stenosis.   LAD system: Calcified plaque proximal LAD with mild (<50%) stenosis.   Circumflex system: Moderate ramus with no significant disease. The AV LCx was relatively small with no plaque or stenosis.   RCA system: Calcified plaque with mild stenosis (<50%) in the proximal RCA.  IMPRESSION: 1. Coronary artery calcium score 260 Agatston units. This places the patient in the 73rd percentile for age and gender, suggesting intermediate risk for future cardiac events.   2.  Nonobstructive CAD.   Dalton Mclean   OVER-READ INTERPRETATION  CT CHEST   The following report is an over-read performed by radiologist Shannon. Charlett Nose of [facility] on 06/01/2018. This over-read does not include interpretation of cardiac or coronary anatomy or pathology. The coronary CTA interpretation by the cardiologist is attached.   COMPARISON:  Chest CT 12/26/2015   FINDINGS: Vascular: Tortuous aorta.  No aneurysm.  Heart is normal size.    Mediastinum/Nodes: No adenopathy in the lower mediastinum or hila.   Lungs/Pleura: No confluent opacities or effusions.   Upper Abdomen: Small low-density lesion in the right hepatic lobe is stable most compatible with cyst. No acute findings   Musculoskeletal: Chest wall soft tissues are unremarkable. No acute bony abnormality.   IMPRESSION: No acute extra cardiac abnormality.   Tortuous aorta.   Electronically Signed: By: Charlett Nose M.Berger. On: 06/01/2018 11:45  Coronary CTA 02/07/22:  EXAM: OVER-READ INTERPRETATION  CT CHEST   The following report is a limited chest CT over-read performed by radiologist Shannon. Irma Newness [facility] on 02/07/2022. This over-read does not include interpretation of cardiac or coronary anatomy or pathology. The coronary CTA interpretation by the cardiologist is attached.   COMPARISON:  Chest CTA 02/06/2021   FINDINGS: Mediastinum/Nodes: No enlarged lymph nodes within the visualized mediastinum.   Lungs/Pleura: There is no pleural effusion. Stable mild bronchiectasis and scarring, primarily within the right middle lobe and lingula. No airspace disease or suspicious pulmonary nodule.   Upper abdomen: The visualized upper abdomen appears stable without significant findings. Probable cyst in the dome of the right hepatic lobe appears unchanged.   Musculoskeletal/Chest wall: No chest wall mass or suspicious osseous findings within the visualized chest. Stable chronic compression deformities at T11 and T12.   IMPRESSION: No significant extracardiac findings within the visualized chest.     Electronically Signed   By: Carey Bullocks M.Berger.   On: 02/07/2022 14:55    Addended by Carey Bullocks, MD on 02/07/2022  2:57 PM   Study Result  Narrative & Impression  CLINICAL DATA:  89F with hypertension, hyperlipidemia, carotid stenosis, and dyspnea.   EXAM: Cardiac/Coronary  CT   TECHNIQUE: The patient was scanned on a  [employer].   FINDINGS: A 120 kV prospective scan was triggered in the descending thoracic aorta at 111 HU's. Axial non-contrast 3 mm slices were carried out through the heart. The data set was analyzed on a dedicated work station and scored using the Agatson method. Gantry rotation speed was 250 msecs and collimation was .6 mm. No beta blockade and 0.8 mg of sl NTG was given. The 3D data set was reconstructed in 5% intervals of the 67-82 % of the R-R cycle. Diastolic phases were analyzed on a dedicated work station using MPR, MIP and VRT modes. The patient received 80 cc of contrast.   Aorta: Normal size.  No calcifications.  No dissection.   Aortic Valve:  Trileaflet.  No calcifications.   Coronary Arteries:  Normal coronary origin.  Right dominance.   RCA is a large dominant artery that gives rise to PDA and PLVB. There is moderate(50-69%) calcified plaque in the proximal RCA and minimal (<25%) calcified plaque in the mid vessel.   Left main is a large artery that gives rise to  LAD and LCX arteries.   LAD is a large vessel that has mild (25-49%) calcified plaque proximally and minimal (<25%) soft plaque distally. D1 is a small vessel without plaque.   LCX is a non-dominant artery.  There is no plaque.   Coronary Calcium Score:   Left main: 0   Left anterior descending artery: 223   Left circumflex artery: 0   Right coronary artery: 104   Total: 326   Percentile: 72nd   Other findings:   Normal pulmonary vein drainage into the left atrium.   Normal let atrial appendage without a thrombus.   Normal size of the pulmonary artery.   Mild mitral annular calcification.   IMPRESSION: 1. Coronary calcium score of 326. This was 72nd percentile for age-, race-, and sex-matched controls.   2. Normal coronary origin with right dominance.   3. Moderate (50-69%) plaque in the RCA and mild (25-49%) plaque in the LAD. CAD-RADS 3.   4.  Will send study for  FFRct.   Interpretation of the non-cardiac intrathoracic structures by Radiology is pending.   Chilton Si, MD   Electronically Signed: By: Chilton Si M.Berger. On: 02/07/2022 13:17    FFRCT ANALYSIS for abnormal coronary CT-A.   FINDINGS: FFRct analysis was performed on the original cardiac CT angiogram dataset. Diagrammatic representation of the FFRct analysis is provided in a separate PDF document in PACS. This dictation was created using the PDF document and an interactive 3D model of the results. 3D model is not available in the EMR/PACS. Normal FFR range is >0.80.   1. Left Main: FFRct 0.99   2. LAD: FFRct 0.92 proximal, 0.91 mid   3. LCX: FFRct 0.97   4. Ramus: FFRct 0.95   5. RCA: FFRct 0.94 proximal, 0.91 mid, 0.91 distal   IMPRESSION: FFRct findings are consistent with non-obstructive CAD.   Tiffany C. Duke Salvia, MD     Electronically Signed   By: Chilton Si M.Berger.   On: 02/07/2022 13:22    ASSESSMENT:    1. CAD in native artery   2. Carotid stenosis, bilateral   3. Primary hypertension   4. Hyperlipidemia, unspecified hyperlipidemia type        PLAN:  In order of problems listed above:  CAD.  Had nonobstructive CAD by CTA in 2019. Repeat CTA Aug 2023 again showed nonobstructive CAD. Normal FFR evaluation. Continue to focus on risk factor modification.   Bilateral carotid artery disease: s/p left CEA. Dopplers in March ok. CTA in August without significant obstruction. Follow up with Shannon Berger  Hypertension: Blood pressure readings are acceptable today. She is labile but avoiding extremes. Continue current therapy  Hyperlipidemia:on lipitor, Zetia and fish oil. Goal LDL < 70. Last LDL 27 on Repatha in January  5.   Mesenteric stenosis - celiac axis and superior mesenteric arteries 70%. Followed by [facility].     Medication Adjustments/Labs and Tests Ordered: Current medicines are reviewed at length with the patient today.  Concerns  regarding medicines are outlined above.  Medication changes, Labs and Tests ordered today are listed in the Patient Instructions below. Patient Instructions  Medication Instructions:   *If you need a refill on your cardiac medications before your next appointment, please call your pharmacy*   Lab Work:  If you have labs (blood work) drawn today and your tests are completely normal, you will receive your results only by: MyChart Message (if you have MyChart) OR A paper copy in the mail If you have any  lab test that is abnormal or we need to change your treatment, we will call you to review the results.   Testing/Procedures:    Follow-Up: At [facility], you and your health needs are our priority.  As part of our continuing mission to provide you with exceptional heart care, we have created designated Provider Care Teams.  These Care Teams include your primary Cardiologist (physician) and Advanced Practice Providers (APPs -  Physician Assistants and Nurse Practitioners) who all work together to provide you with the care you need, when you need it.  We recommend signing up for the patient portal called "MyChart".  Sign up information is provided on this After Visit Summary.  MyChart is used to connect with patients for Virtual Visits (Telemedicine).  Patients are able to view lab/test results, encounter notes, upcoming appointments, etc.  Non-urgent messages can be sent to your provider as well.   To learn more about what you can do with MyChart, go to [email or web address].    Your next appointment:   6 months  Provider:       Other Instructions     Signed, Shayda Kalka [place], MD  03/21/2023 1:59 PM    [facility] [address] [place], [place], [place]  [address] Phone: [phone]; Fax: [phone]

## 2023-03-19 ENCOUNTER — Other Ambulatory Visit: Payer: Self-pay | Admitting: Urology

## 2023-03-20 DIAGNOSIS — M25559 Pain in unspecified hip: Secondary | ICD-10-CM | POA: Diagnosis not present

## 2023-03-20 DIAGNOSIS — M419 Scoliosis, unspecified: Secondary | ICD-10-CM | POA: Diagnosis not present

## 2023-03-20 DIAGNOSIS — R531 Weakness: Secondary | ICD-10-CM | POA: Diagnosis not present

## 2023-03-20 DIAGNOSIS — M546 Pain in thoracic spine: Secondary | ICD-10-CM | POA: Diagnosis not present

## 2023-03-20 NOTE — Patient Instructions (Signed)
DUE TO COVID-19 ONLY TWO VISITORS  (aged 82 and older)  ARE ALLOWED TO COME WITH YOU AND STAY IN THE WAITING ROOM ONLY DURING PRE OP AND PROCEDURE.   **NO VISITORS ARE ALLOWED IN THE SHORT STAY AREA OR RECOVERY ROOM!!**  IF YOU WILL BE ADMITTED INTO THE HOSPITAL YOU ARE ALLOWED ONLY FOUR SUPPORT PEOPLE DURING VISITATION HOURS ONLY (7 AM -8PM)   The support person(s) must pass our screening, gel in and out, and wear a mask at all times, including in the patient's room. Patients must also wear a mask when staff or their support person are in the room. Visitors GUEST BADGE MUST BE WORN VISIBLY  One adult visitor may remain with you overnight and MUST be in the room by 8 P.M.     Your procedure is scheduled on: 03/25/23   Report to [facility] Main Entrance    Report to admitting at : 7:45 AM   Call this number if you have problems the morning of surgery [phone]   Do not eat food :After Midnight.   After Midnight you may have the following liquids until: 7:00 AM DAY OF SURGERY  Water Black Coffee (sugar ok, NO MILK/CREAM OR CREAMERS)  Tea (sugar ok, NO MILK/CREAM OR CREAMERS) regular and decaf                             Plain Jell-O (NO RED)                                           Fruit ices (not with fruit pulp, NO RED)                                     Popsicles (NO RED)                                                                  Juice: apple, WHITE grape, WHITE cranberry Sports drinks like Gatorade (NO RED)              FOLLOW ANY ADDITIONAL PRE OP INSTRUCTIONS YOU RECEIVED FROM YOUR SURGEON'S OFFICE!!!   Oral Hygiene is also important to reduce your risk of infection.                                    Remember - BRUSH YOUR TEETH THE MORNING OF SURGERY WITH YOUR REGULAR TOOTHPASTE  DENTURES WILL BE REMOVED PRIOR TO SURGERY PLEASE DO NOT APPLY "Poly grip" OR ADHESIVES!!!   Do NOT smoke after Midnight   Take these medicines the morning of surgery with A  SIP OF WATER: bupropion,desvenlafaxine,amlodipine.                              You may not have any metal on your body including hair pins, jewelry, and body piercing  Do not wear make-up, lotions, powders, perfumes/cologne, or deodorant  Do not wear nail polish including gel and S&S, artificial/acrylic nails, or any other type of covering on natural nails including finger and toenails. If you have artificial nails, gel coating, etc. that needs to be removed by a nail salon please have this removed prior to surgery or surgery may need to be canceled/ delayed if the surgeon/ anesthesia feels like they are unable to be safely monitored.   Do not shave  48 hours prior to surgery.    Do not bring valuables to the hospital. [facility] IS NOT             RESPONSIBLE   FOR VALUABLES.   Contacts, glasses, or bridgework may not be worn into surgery.   Bring small overnight bag day of surgery.   DO NOT BRING YOUR HOME MEDICATIONS TO THE HOSPITAL. PHARMACY WILL DISPENSE MEDICATIONS LISTED ON YOUR MEDICATION LIST TO YOU DURING YOUR ADMISSION IN THE HOSPITAL!    Patients discharged on the day of surgery will not be allowed to drive home.  Someone NEEDS to stay with you for the first 24 hours after anesthesia.   Special Instructions: Bring a copy of your healthcare power of attorney and living will documents         the day of surgery if you haven't scanned them before.              Please read over the following fact sheets you were given: IF YOU HAVE QUESTIONS ABOUT YOUR PRE-OP INSTRUCTIONS PLEASE CALL [phone]    [facility] - Preparing for Surgery Before surgery, you can play an important role.  Because skin is not sterile, your skin needs to be as free of germs as possible.  You can reduce the number of germs on your skin by washing with CHG (chlorahexidine gluconate) soap before surgery.  CHG is an antiseptic cleaner which kills germs and bonds with the skin to continue killing  germs even after washing. Please DO NOT use if you have an allergy to CHG or antibacterial soaps.  If your skin becomes reddened/irritated stop using the CHG and inform your nurse when you arrive at Short Stay. Do not shave (including legs and underarms) for at least 48 hours prior to the first CHG shower.  You may shave your face/neck. Please follow these instructions carefully:  1.  Shower with CHG Soap the night before surgery and the  morning of Surgery.  2.  If you choose to wash your hair, wash your hair first as usual with your  normal  shampoo.  3.  After you shampoo, rinse your hair and body thoroughly to remove the  shampoo.                           4.  Use CHG as you would any other liquid soap.  You can apply chg directly  to the skin and wash                       Gently with a scrungie or clean washcloth.  5.  Apply the CHG Soap to your body ONLY FROM THE NECK DOWN.   Do not use on face/ open                           Wound or open sores. Avoid contact with eyes,  ears mouth and genitals (private parts).                       Wash face,  Genitals (private parts) with your normal soap.             6.  Wash thoroughly, paying special attention to the area where your surgery  will be performed.  7.  Thoroughly rinse your body with warm water from the neck down.  8.  DO NOT shower/wash with your normal soap after using and rinsing off  the CHG Soap.                9.  Pat yourself dry with a clean towel.            10.  Wear clean pajamas.            11.  Place clean sheets on your bed the night of your first shower and do not  sleep with pets. Day of Surgery : Do not apply any lotions/deodorants the morning of surgery.  Please wear clean clothes to the hospital/surgery center.  FAILURE TO FOLLOW THESE INSTRUCTIONS MAY RESULT IN THE CANCELLATION OF YOUR SURGERY PATIENT SIGNATURE_________________________________  NURSE  SIGNATURE__________________________________  ________________________________________________________________________

## 2023-03-21 ENCOUNTER — Encounter (HOSPITAL_COMMUNITY)
Admission: RE | Admit: 2023-03-21 | Discharge: 2023-03-21 | Disposition: A | Discharge status: Home / Self Care | Payer: Medicare Other | Source: Ambulatory Visit | Attending: Urology | Admitting: Urology

## 2023-03-21 ENCOUNTER — Other Ambulatory Visit (HOSPITAL_COMMUNITY): Payer: Medicare Other

## 2023-03-21 ENCOUNTER — Encounter: Payer: Self-pay | Admitting: Cardiology

## 2023-03-21 ENCOUNTER — Other Ambulatory Visit: Payer: Self-pay

## 2023-03-21 ENCOUNTER — Encounter (HOSPITAL_COMMUNITY): Payer: Self-pay

## 2023-03-21 ENCOUNTER — Ambulatory Visit: Payer: Medicare Other | Attending: Cardiology | Admitting: Cardiology

## 2023-03-21 VITALS — BP 127/70 | HR 72 | Temp 97.7°F | Ht 60.0 in | Wt 102.0 lb

## 2023-03-21 VITALS — BP 98/68 | HR 85 | Ht 61.5 in | Wt 106.6 lb

## 2023-03-21 DIAGNOSIS — N189 Chronic kidney disease, unspecified: Secondary | ICD-10-CM | POA: Diagnosis not present

## 2023-03-21 DIAGNOSIS — I2510 Atherosclerotic heart disease of native coronary artery without angina pectoris: Secondary | ICD-10-CM | POA: Diagnosis not present

## 2023-03-21 DIAGNOSIS — I129 Hypertensive chronic kidney disease with stage 1 through stage 4 chronic kidney disease, or unspecified chronic kidney disease: Secondary | ICD-10-CM | POA: Diagnosis not present

## 2023-03-21 DIAGNOSIS — I6523 Occlusion and stenosis of bilateral carotid arteries: Secondary | ICD-10-CM | POA: Diagnosis not present

## 2023-03-21 DIAGNOSIS — Z01812 Encounter for preprocedural laboratory examination: Secondary | ICD-10-CM | POA: Insufficient documentation

## 2023-03-21 DIAGNOSIS — I10 Essential (primary) hypertension: Secondary | ICD-10-CM | POA: Insufficient documentation

## 2023-03-21 DIAGNOSIS — Z8673 Personal history of transient ischemic attack (TIA), and cerebral infarction without residual deficits: Secondary | ICD-10-CM | POA: Insufficient documentation

## 2023-03-21 DIAGNOSIS — E785 Hyperlipidemia, unspecified: Secondary | ICD-10-CM

## 2023-03-21 DIAGNOSIS — C679 Malignant neoplasm of bladder, unspecified: Secondary | ICD-10-CM | POA: Insufficient documentation

## 2023-03-21 DIAGNOSIS — H903 Sensorineural hearing loss, bilateral: Secondary | ICD-10-CM | POA: Diagnosis not present

## 2023-03-21 DIAGNOSIS — H6123 Impacted cerumen, bilateral: Secondary | ICD-10-CM | POA: Diagnosis not present

## 2023-03-21 LAB — CBC
HCT: 39 % (ref 36.0–46.0)
Hemoglobin: 12.8 g/dL (ref 12.0–15.0)
MCH: 33.2 pg (ref 26.0–34.0)
MCHC: 32.8 g/dL (ref 30.0–36.0)
MCV: 101.3 fL — ABNORMAL HIGH (ref 80.0–100.0)
Platelets: 277 10*3/uL (ref 150–400)
RBC: 3.85 MIL/uL — ABNORMAL LOW (ref 3.87–5.11)
RDW: 12.2 % (ref 11.5–15.5)
WBC: 6.9 10*3/uL (ref 4.0–10.5)
nRBC: 0 % (ref 0.0–0.2)

## 2023-03-21 LAB — BASIC METABOLIC PANEL
Anion gap: 11 (ref 5–15)
BUN: 39 mg/dL — ABNORMAL HIGH (ref 8–23)
CO2: 22 mmol/L (ref 22–32)
Calcium: 9.7 mg/dL (ref 8.9–10.3)
Chloride: 103 mmol/L (ref 98–111)
Creatinine, Ser: 1.36 mg/dL — ABNORMAL HIGH (ref 0.44–1.00)
GFR, Estimated: 39 mL/min — ABNORMAL LOW (ref >60)
Glucose, Bld: 91 mg/dL (ref 70–99)
Potassium: 4.6 mmol/L (ref 3.5–5.1)
Sodium: 136 mmol/L (ref 135–145)

## 2023-03-21 NOTE — Patient Instructions (Signed)
Medication Instructions:  No Changes *If you need a refill on your cardiac medications before your next appointment, please call your pharmacy*   Lab Work: No Labs If you have labs (blood work) drawn today and your tests are completely normal, you will receive your results only by: MyChart Message (if you have MyChart) OR A paper copy in the mail If you have any lab test that is abnormal or we need to change your treatment, we will call you to review the results.   Testing/Procedures: No Testing   Follow-Up: At [facility], you and your health needs are our priority.  As part of our continuing mission to provide you with exceptional heart care, we have created designated Provider Care Teams.  These Care Teams include your primary Cardiologist (physician) and Advanced Practice Providers (APPs -  Physician Assistants and Nurse Practitioners) who all work together to provide you with the care you need, when you need it.  We recommend signing up for the patient portal called "MyChart".  Sign up information is provided on this After Visit Summary.  MyChart is used to connect with patients for Virtual Visits (Telemedicine).  Patients are able to view lab/test results, encounter notes, upcoming appointments, etc.  Non-urgent messages can be sent to your provider as well.   To learn more about what you can do with MyChart, go to [email or web address].    Your next appointment:   March 17 th at 1:20 PM  Provider:   Peter Swaziland, MD

## 2023-03-21 NOTE — Progress Notes (Signed)
For Short Stay: COVID SWAB appointment date:  Bowel Prep reminder:   For Anesthesia: PCP - Rodrigo Ran, MD  Cardiologist - [place], [name], MD. LOV: 01/23/23  Chest x-ray - 02/12/23 EKG - 02/13/23 Stress Test -  ECHO - 05/21/18 Cardiac Cath -  Pacemaker/ICD device last checked: Pacemaker orders received: Device Rep notified:  Spinal Cord Stimulator:  Sleep Study - Yes ( use mouth guard) CPAP - NO  Fasting Blood Sugar - N/A Checks Blood Sugar _____ times a day Date and result of last Hgb A1c-  Last dose of GLP1 agonist- N/A GLP1 instructions:   Last dose of SGLT-2 inhibitors- N/A SGLT-2 instructions:   Blood Thinner Instructions: Aspirin Instructions: Aspirin 325 mg. Is on hold. Last Dose:  Activity level: Can go up a flight of stairs and activities of daily living without stopping and without chest pain and/or shortness of breath   Able to exercise without chest pain and/or shortness of breath   Anesthesia review: Hx: TIA's,CAD,Murmur,HTN,CKD III,OSA.  Patient denies shortness of breath, fever, cough and chest pain at PAT appointment   Patient verbalized understanding of instructions that were given to them at the PAT appointment. Patient was also instructed that they will need to review over the PAT instructions again at home before surgery.

## 2023-03-24 NOTE — H&P (Signed)
Office Visit Report     03/17/2023   --------------------------------------------------------------------------------   Shannon Berger  MRN: [id number]  DOB: 17-Mar-1941, 82 year old Female  PRIMARY CARE:  Latrelle Dodrill. Alvester Morin III,   PRIMARY CARE FAX:  [phone]  REFERRING:  Latrelle Dodrill. Octavia Heir,   PROVIDER:  Rhoderick Moody, M.D.  LOCATION:  [facility] [phone]     --------------------------------------------------------------------------------   CC/HPI: Bladder lesion   [name] is a 82 year old female who was found to have a 1.5 cm bladder lesion on renal/bladder ultrasound from 05/14/2020 during an evaluation for chronic kidney disease. She was found to have a papillary bladder tumor adjacent to the right UO during office cystoscopy and is s/p TURBT with right stent placement and gemcitabine instillation on 06/06/20.   Bladder tumor pathology: low grade Ta UCC   09/21/20: The patient is here today for a routine follow-up and surveillance cystoscopy. She reports worsening urgency with sporadic episodes of urge incontinence since her TURBT. Wears 1 ppd as a precautions. Otherwise, she is doing well and denies interval UTIs, dysuria or hematuria.   12/28/20: The patient is here today for a routine follow-up and surveillance cystoscopy. From a urinary standpoint, the patient was started on oxybutynin 5 mg XR at her last visit. She reports improvement in her OAB symptoms, but states that she occasionally has urinary hesitancy and will have to Crede in order to initiate her stream. She states that this happens once every 3 to 4 weeks. Overall, she reports a good force of stream and generally feels like she is emptying her bladder well. She denies interval episodes of dysuria or gross hematuria.   04/13/21: The patient is here today for a routine follow-up and surveillance cystoscopy. She has done well since her last visit and denies interval UTIs, dysuria or  hematuria. She notes that her OAB symptoms have markedly improved since June. She admits that she only took the oxybutynin for short amount of time and is currently off the medication.   07/23/21: The patient is here today for a routine follow-up and surveillance cystoscopy. She is doing well and reports stable OAB sxs. Denies interval UTIs, dysuria or hematuria.   01/24/22: The patient is here today for a routine follow-up and surveillance cystoscopy. She reports no new health issues or urinary complaints. doing well.   08/01/22: The patient is here today for a routine follow-up and surveillance cystoscopy. She has done well since her last visit and reports no new health issues or urinary complaints.   08/15/2022: The patient is here today for cystoscopy and bladder tumor fulguration. She is found to have two, 5 mm papillary bladder tumors involving the posterior bladder wall during her last surveillance cystoscopy. Surveillance CT showed no GU abnormalities, but did note an area of endometrial thickening. She has done well since her last visit denies interval UTIs, dysuria or hematuria.   09/23/2022: 82 year old female who presents today for a follow-up office visit. She recently trialed a course of oxybutynin due to urinary frequency and urgency and began having hallucinations. A course of cephalexin was called into her pharmacy due to probable cystitis. The patient was out of town in [place] staying with her daughter at the time. She states after she began the cephalexin her hallucinations resolved. She is feeling much better and denies any complaints today   11/14/22: The patient is here today for a routine follow-up and surveillance cystoscopy.   12/09/2022: 83 year old female who was found to  have a less than 2 cm bladder tumor presents today with concerns of UTI. She is going to be starting BCG on 12/24/2022. She states that she is nervous and does not know much about BCG. Over the past few days she has  had some hesitancy associated with urgency. She denies gross hematuria, fevers, chills. Urinalysis is not overly concerning for infection today.   03/17/23: The patient is here today for a routine follow-up and surveillance cystoscopy. She recently completed induction BCG without any issues.     ALLERGIES: Codone's    MEDICATIONS: Oxybutynin Chloride 5 mg tablet 1 tablet PO q 8 hr prn  Alprazolam 2 mg tablet Oral  Amlodipine Besilate  Aspir 81 81 mg tablet, delayed release Oral  Atorvastatin Calcium 40 mg tablet  Bisoprolol-Hydrochlorothiazide 2.5-6.25 MG Oral Tablet Oral  Calcium + D3  Desvenlafaxine Er 100 mg tablet, extended release 24 hr  Fish Oil 120 mg-180 mg capsule Oral  Multivitamins tablet Oral  Senna  Tymlos 80 mcg/dose (3,120 mcg/1.56 ml) pen injector  Zinc     GU PSH: Bladder Instill AntiCA Agent - 02/04/2023, 01/21/2023, 01/14/2023, 01/07/2023, 12/31/2022, 12/24/2022, 2021 Cysto Remove Stent FB Sim - 2021 Cystoscopy - 08/01/2022, 01/24/2022, 2023, 04/13/2021, 2022, 2022, 2021 Cystoscopy Fulguration - 08/15/2022 Cystoscopy Insert Stent, Right - 2021 Cystoscopy TURBT <2 cm - 11/14/2022, 2021 Locm 300-399Mg /Ml Iodine,1Ml - 08/09/2022     NON-GU PSH: Cataract surgery, Bilateral Fusion Spine Treat Retina Visit Complexity (formerly GPC1X) - 11/14/2022, 08/15/2022     GU PMH: Bladder Cancer Dome - 02/04/2023, - 01/21/2023, - 01/14/2023, - 01/07/2023, - 12/31/2022, - 12/24/2022, - 12/09/2022, - 11/14/2022 Acute Cystitis/UTI - 12/09/2022, - 09/23/2022 Bladder Cancer Posterior - 08/15/2022, - 08/09/2022, - 08/01/2022, Low risk, Ta UCC of the bladder , - 2021 Chronic kidney disease stage 3 (GFR 30-60) - 08/15/2022 History of bladder cancer - 01/24/2022, - 2023, - 04/13/2021, - 2022, - 2022 Overactive bladder - 04/13/2021, - 2022, - 2022 Bladder tumor/neoplasm - 2021      PMH Notes:  1898-07-08 00:00:00 - Note: Normal Routine History And Physical Senior Citizen (65-80)   NON-GU PMH: Abnormal findings on  diagnostic imaging of other specified body structures - 08/15/2022 Anxiety Arthritis Depression Hypercholesterolemia Hypertension Stroke/TIA    FAMILY HISTORY: Colon Cancer - Mother Diabetes - Father Heart problem - Father   SOCIAL HISTORY: Marital Status: Married    REVIEW OF SYSTEMS:    GU Review Female:   Patient denies frequent urination, hard to postpone urination, burning /pain with urination, get up at night to urinate, leakage of urine, stream starts and stops, trouble starting your stream, have to strain to urinate, and being pregnant.  Gastrointestinal (Upper):   Patient denies nausea, vomiting, and indigestion/ heartburn.  Gastrointestinal (Lower):   Patient denies diarrhea and constipation.  Constitutional:   Patient denies fever, night sweats, weight loss, and fatigue.  Skin:   Patient denies skin rash/ lesion and itching.  Eyes:   Patient denies blurred vision and double vision.  Ears/ Nose/ Throat:   Patient denies sore throat and sinus problems.  Hematologic/Lymphatic:   Patient denies swollen glands and easy bruising.  Cardiovascular:   Patient denies leg swelling and chest pains.  Respiratory:   Patient denies cough and shortness of breath.  Endocrine:   Patient denies excessive thirst.  Musculoskeletal:   Patient denies back pain and joint pain.  Neurological:   Patient denies headaches and dizziness.  Psychologic:   Patient denies depression and anxiety.   VITAL  SIGNS: None   MULTI-SYSTEM PHYSICAL EXAMINATION:    Constitutional: Well-nourished. No physical deformities. Normally developed. Good grooming.  Cardiovascular: Normal temperature, normal extremity pulses, no swelling, no varicosities.  Skin: No paleness, no jaundice, no cyanosis. No lesion, no ulcer, no rash.  Neurologic / Psychiatric: Oriented to time, oriented to place, oriented to person. No depression, no anxiety, no agitation.  Gastrointestinal: No mass, no tenderness, no rigidity, non obese  abdomen.     Complexity of Data:  Records Review:   Pathology Reports, Previous Patient Records   PROCEDURES:         Flexible Cystoscopy - 52000  Risks, benefits, and some of the potential complications of the procedure were discussed at length with the patient including infection, bleeding, voiding discomfort, urinary retention, fever, chills, sepsis, and others. All questions were answered. Informed consent was obtained. Antibiotic prophylaxis was given. Sterile technique and intraurethral analgesia were used.  Meatus:  Normal size. Normal location. Normal condition.  Urethra:  No hypermobility. No leakage.  Ureteral Orifices:  Normal location. Normal size. Normal shape. Effluxed clear urine.  Bladder:  No trabeculation. No tumors. Normal mucosa. No stones.      The lower urinary tract was carefully examined. The procedure was well-tolerated and without complications. Antibiotic instructions were given. Instructions were given to call the office immediately for bloody urine, difficulty urinating, urinary retention, painful or frequent urination, fever, chills, nausea, vomiting or other illness. The patient stated that she understood these instructions and would comply with them.         Visit Complexity - G2211          Urinalysis w/Scope Dipstick Dipstick Cont'd Micro  Color: Yellow Bilirubin: Neg mg/dL WBC/hpf: 0 - 5/hpf  Appearance: Clear Ketones: Neg mg/dL RBC/hpf: 0 - 2/hpf  Specific Gravity: 1.025 Blood: Neg ery/uL Bacteria: Mod (26-50/hpf)  pH: <=5.0 Protein: 1+ mg/dL Cystals: NS (Not Seen)  Glucose: Neg mg/dL Urobilinogen: 0.2 mg/dL Casts: Hyaline    Nitrites: Neg Trichomonas: Not Present    Leukocyte Esterase: Neg leu/uL Mucous: Present      Epithelial Cells: 0 - 5/hpf      Yeast: NS (Not Seen)      Sperm: Not Present    ASSESSMENT:      ICD-10 Details  1 GU:   Bladder Cancer Lateral - C67.2 Chronic, Stable   PLAN:           Schedule Return Visit/Planned  Activity: Next Available Appointment - Schedule Surgery          Document Letter(s):  Created for Patient: Clinical Summary         Notes:   -Recurrent papillary tumor in the same location that was fulgurated several months ago at the 9 o'clock position of the right bladder neck/lateral wall. The tumor only measures approximately 5 mm in greatest dimension. Plan for cystoscopy with bladder biopsy/resection and fulguration.

## 2023-03-24 NOTE — Progress Notes (Signed)
Anesthesia Chart Review   Case: [id number] Date/Time: 03/25/23 0945   Procedures:      TRANSURETHRAL RESECTION OF BLADDER TUMOR (TURBT) - 30 MINUTES     CYSTOSCOPY   Anesthesia type: General   Pre-op diagnosis: BLADDER TUMOR   Location: [facility] ROOM 01 / [facility]   Surgeons: Rene Paci, MD       DISCUSSION:82 y.o. never smoker with h/o HTN, nonobstructive CAD by CTA 2023, carotid artery disease s/p left CEA, TIA, CKD, bladder cancer scheduled for above procedure 03/25/2023 with Dr. Cristal Deer Liliane Shi.   Pt last seen by cardiology 03/21/2023.  Per office visit note patient with nonobstructive CAD, asymptomatic.  Bilateral carotid artery disease Dopplers again in March, CTA in August without significant obstruction.  HTN with acceptable BP readings at this visit.  63-month follow-up recommended. VS: BP 127/70   Pulse 72   Temp 36.5 C (Oral)   Ht 5' (1.524 m)   Wt 46.3 kg   SpO2 97%   BMI 19.92 kg/m   PROVIDERS: Rodrigo Ran, MD is PCP   [place], Peter, MD is Cardiologist  Waverly Ferrari, MD is vascular surgeon. LABS: Labs reviewed: Acceptable for surgery. (all labs ordered are listed, but only abnormal results are displayed)  Labs Reviewed  BASIC METABOLIC PANEL - Abnormal; Notable for the following components:      Result Value   BUN 39 (*)    Creatinine, Ser 1.36 (*)    GFR, Estimated 39 (*)    All other components within normal limits  CBC - Abnormal; Notable for the following components:   RBC 3.85 (*)    MCV 101.3 (*)    All other components within normal limits     IMAGES: CTA Angio Head 02/12/2023 IMPRESSION: 1. No emergent large vessel occlusion or hemodynamically significant stenosis of the head or neck. 2. Bilateral carotid bifurcation atherosclerosis without hemodynamically significant stenosis. 3. Chronic ischemic microangiopathy.  EKG:   CV: CT Cardiac Scoring 01/11/2022 IMPRESSION: 1. Coronary artery calcium score of Agatston score of  315. This places the patient in the seventy-first percentile in the [facility] database for patients of same age, gender and race/ethnicity. 2. Scarring in the RIGHT middle lobe and RIGHT lower lobe, with mild bronchiectatic changes and subtle tree-in-bud opacities. Findings are similar to prior imaging. Findings may represent sequela of chronic infection such as MAI. Past Medical History:  Diagnosis Date   Anginal pain (HCC)    occasionally   Anxiety    Arthritis    Bladder cancer (HCC)    Carotid arterial disease (HCC)    Chronic kidney disease    Complication of anesthesia    "hallucinates for days; have a hard time waking up; almost couldn't get her awake last time; wake up wild" (05/26/2018)   Constipation    Degeneration of lumbar intervertebral disc    Dislocation of hip joint prosthesis (HCC)    Essential tremor    Fusion of spine of lumbar region    Heart murmur    "slight one" (05/26/2018)   Hyperlipidemia    Hypertension    Low back pain radiating to left lower extremity    Melanoma (HCC)    "center of upper chest"   Osteoarthritis    Osteopenia    Sleep difficulties    Spinal stenosis at L4-L5 level    Stroke [facility])    tia--last week   TIA (transient ischemic attack) 05/20/2018    Past Surgical History:  Procedure Laterality Date  ANTERIOR LUMBAR FUSION     added rods and screws;   BACK SURGERY     BLADDER SURGERY     BREAST BIOPSY Left    "not cancer"   BUNIONECTOMY Left    CAROTID ENDARTERECTOMY Left 05/26/2018   CATARACT EXTRACTION W/ INTRAOCULAR LENS  IMPLANT, BILATERAL Bilateral    ENDARTERECTOMY Left 05/26/2018   Procedure: ENDARTERECTOMY CAROTID LEFT;  Surgeon: Chuck Hint, MD;  Location: [facility] OR;  Service: Vascular;  Laterality: Left;   EYE SURGERY Right    "retina wrinkle OR; now can't see out of that eye" (05/26/2018)   JOINT REPLACEMENT Left 2014   Hip   LUMBAR SPINE HARDWARE REMOVAL     "went in thru the front"   MELANOMA EXCISION      "center of upper chest"   PATCH ANGIOPLASTY Left 05/26/2018   Procedure: PATCH ANGIOPLASTY LEFT CAROTID ARTERY;  Surgeon: Chuck Hint, MD;  Location[facility] OR;  Service: Vascular;  Laterality: Left;   PILONIDAL CYST DRAINAGE     POSTERIOR LUMBAR FUSION  X 2   TENDON REPAIR Left    Reattachment of tendon- Left hip   THUMB FUSION Right    TONSILLECTOMY AND ADENOIDECTOMY     TOTAL HIP ARTHROPLASTY Left 09/04/2012   Procedure: LEFT TOTAL HIP ARTHROPLASTY ANTERIOR APPROACH;  Surgeon: Kathryne Hitch, MD;  Location: [facility];  Service: Orthopedics;  Laterality: Left;  Left Total Hip Arthroplasty, Anterior Approach   TRANSURETHRAL RESECTION OF BLADDER TUMOR N/A 06/06/2020   Procedure: TRANSURETHRAL RESECTION OF BLADDER TUMOR(TURBT) WITH CYSTOSCOPY/ INSTILLATION OF GEMCITABINE WITH RIGHT STENT PLACEMENT;  Surgeon: Rene Paci, MD;  Location: [facility];  Service: Urology;  Laterality: N/A;    MEDICATIONS:  acetaminophen (TYLENOL) 500 MG tablet   ALPRAZolam (XANAX) 0.5 MG tablet   amLODipine (NORVASC) 5 MG tablet   Ascorbic Acid (VITAMIN C) 1000 MG tablet   aspirin 325 MG tablet   atorvastatin (LIPITOR) 40 MG tablet   buPROPion (WELLBUTRIN XL) 150 MG 24 hr tablet   Calcium Carbonate-Vit D-Min (CALCIUM 1200 PO)   Cholecalciferol (VITAMIN D) 125 MCG (5000 UT) CAPS   Cyanocobalamin (VITAMIN B-12) 5000 MCG TBDP   denosumab (PROLIA) 60 MG/ML SOSY injection   desvenlafaxine (PRISTIQ) 100 MG 24 hr tablet   ezetimibe (ZETIA) 10 MG tablet   lamoTRIgine (LAMICTAL) 150 MG tablet   Multiple Vitamin (MULTIVITAMIN) tablet   olmesartan (BENICAR) 40 MG tablet   Omega-3 Fatty Acids (FISH OIL PO)   REPATHA SURECLICK 140 MG/ML SOAJ   tretinoin (RETIN-A) 0.05 % cream   zinc gluconate 50 MG tablet   No current facility-administered medications for this encounter.    gemcitabine (GEMZAR) chemo syringe for bladder instillation 2,000 mg     Jodell Cipro Ward, PA-C [facility]  Pre-Surgical Testing [phone]

## 2023-03-24 NOTE — Anesthesia Preprocedure Evaluation (Addendum)
Anesthesia Evaluation  Patient identified by MRN, date of birth, ID band Patient awake    Reviewed: Allergy & Precautions, NPO status , Patient's Chart, lab work & pertinent test results  History of Anesthesia Complications (+) PROLONGED EMERGENCE, Emergence Delirium and history of anesthetic complications  Airway Mallampati: II  TM Distance: >3 FB Neck ROM: Full    Dental  (+) Teeth Intact, Dental Advisory Given   Pulmonary sleep apnea    Pulmonary exam normal breath sounds clear to auscultation       Cardiovascular hypertension (143/77 preop), Pt. on medications Normal cardiovascular exam Rhythm:Regular Rate:Normal     Neuro/Psych  PSYCHIATRIC DISORDERS Anxiety Depression    CVA    GI/Hepatic Neg liver ROS,GERD  Controlled,,  Endo/Other  negative endocrine ROS    Renal/GU negative Renal ROS  negative genitourinary   Musculoskeletal  (+) Arthritis , Osteoarthritis,    Abdominal   Peds  Hematology negative hematology ROS (+)   Anesthesia Other Findings   Reproductive/Obstetrics negative OB ROS                             Anesthesia Physical Anesthesia Plan  ASA: 2  Anesthesia Plan: General   Post-op Pain Management: Tylenol PO (pre-op)*   Induction: Intravenous  PONV Risk Score and Plan: 3 and Ondansetron, Dexamethasone and Treatment may vary due to age or medical condition  Airway Management Planned: LMA  Additional Equipment: None  Intra-op Plan:   Post-operative Plan: Extubation in OR  Informed Consent: I have reviewed the patients History and Physical, chart, labs and discussed the procedure including the risks, benefits and alternatives for the proposed anesthesia with the patient or authorized representative who has indicated his/her understanding and acceptance.     Dental advisory given  Plan Discussed with: CRNA  Anesthesia Plan Comments: (Airway for TURBT  2021: Ventilation: Mask ventilation without difficulty Laryngoscope Size: Mac and 3 Grade View: Grade II Tube type: Oral Tube size: 7.0 mm Number of attempts: 1 )       Anesthesia Quick Evaluation

## 2023-03-25 ENCOUNTER — Ambulatory Visit (HOSPITAL_BASED_OUTPATIENT_CLINIC_OR_DEPARTMENT_OTHER): Payer: Self-pay | Admitting: Certified Registered"

## 2023-03-25 ENCOUNTER — Ambulatory Visit (HOSPITAL_COMMUNITY)
Admission: RE | Admit: 2023-03-25 | Discharge: 2023-03-25 | Disposition: A | Discharge status: Home / Self Care | Payer: Medicare Other | Attending: Urology | Admitting: Urology

## 2023-03-25 ENCOUNTER — Encounter (HOSPITAL_COMMUNITY): Payer: Self-pay | Admitting: Urology

## 2023-03-25 ENCOUNTER — Encounter (HOSPITAL_COMMUNITY)
Admission: RE | Disposition: A | Discharge status: Home / Self Care | Payer: Self-pay | Source: Home / Self Care | Attending: Urology

## 2023-03-25 ENCOUNTER — Ambulatory Visit (HOSPITAL_COMMUNITY): Payer: Medicare Other | Admitting: Certified Registered"

## 2023-03-25 DIAGNOSIS — D414 Neoplasm of uncertain behavior of bladder: Secondary | ICD-10-CM | POA: Insufficient documentation

## 2023-03-25 DIAGNOSIS — C679 Malignant neoplasm of bladder, unspecified: Secondary | ICD-10-CM | POA: Diagnosis not present

## 2023-03-25 DIAGNOSIS — F419 Anxiety disorder, unspecified: Secondary | ICD-10-CM | POA: Insufficient documentation

## 2023-03-25 DIAGNOSIS — Z8673 Personal history of transient ischemic attack (TIA), and cerebral infarction without residual deficits: Secondary | ICD-10-CM | POA: Diagnosis not present

## 2023-03-25 DIAGNOSIS — C672 Malignant neoplasm of lateral wall of bladder: Secondary | ICD-10-CM | POA: Insufficient documentation

## 2023-03-25 DIAGNOSIS — I10 Essential (primary) hypertension: Secondary | ICD-10-CM | POA: Diagnosis not present

## 2023-03-25 DIAGNOSIS — D494 Neoplasm of unspecified behavior of bladder: Secondary | ICD-10-CM | POA: Diagnosis not present

## 2023-03-25 DIAGNOSIS — F32A Depression, unspecified: Secondary | ICD-10-CM | POA: Diagnosis not present

## 2023-03-25 DIAGNOSIS — G4730 Sleep apnea, unspecified: Secondary | ICD-10-CM | POA: Insufficient documentation

## 2023-03-25 HISTORY — PX: TRANSURETHRAL RESECTION OF BLADDER TUMOR: SHX2575

## 2023-03-25 HISTORY — PX: CYSTOSCOPY: SHX5120

## 2023-03-25 SURGERY — TURBT (TRANSURETHRAL RESECTION OF BLADDER TUMOR)
Anesthesia: General | Site: Bladder

## 2023-03-25 MED ORDER — FENTANYL CITRATE PF 50 MCG/ML IJ SOSY: 25.0000 ug | PREFILLED_SYRINGE | INTRAMUSCULAR | Status: DC | PRN

## 2023-03-25 MED ORDER — LIDOCAINE 2% (20 MG/ML) 5 ML SYRINGE
INTRAMUSCULAR | Status: DC | PRN
  Administered 2023-03-25: 40 mg via INTRAVENOUS

## 2023-03-25 MED ORDER — EPHEDRINE SULFATE-NACL 50-0.9 MG/10ML-% IV SOSY
PREFILLED_SYRINGE | INTRAVENOUS | Status: DC | PRN
  Administered 2023-03-25 (×2): 5 mg via INTRAVENOUS
  Administered 2023-03-25: 10 mg via INTRAVENOUS

## 2023-03-25 MED ORDER — DEXMEDETOMIDINE HCL IN NACL 80 MCG/20ML IV SOLN
INTRAVENOUS | Status: DC | PRN
Start: 2023-03-25 — End: 2023-03-25
  Administered 2023-03-25: 4 ug via INTRAVENOUS

## 2023-03-25 MED ORDER — DEXAMETHASONE SODIUM PHOSPHATE 10 MG/ML IJ SOLN
INTRAMUSCULAR | Status: AC
  Filled 2023-03-25: qty 1

## 2023-03-25 MED ORDER — LACTATED RINGERS IV SOLN: INTRAVENOUS | Status: DC

## 2023-03-25 MED ORDER — AMISULPRIDE (ANTIEMETIC) 5 MG/2ML IV SOLN: 10.0000 mg | Freq: Once | INTRAVENOUS | Status: DC | PRN

## 2023-03-25 MED ORDER — ONDANSETRON HCL 4 MG/2ML IJ SOLN: 4.0000 mg | Freq: Once | INTRAMUSCULAR | Status: DC | PRN

## 2023-03-25 MED ORDER — PHENAZOPYRIDINE HCL 200 MG PO TABS: 200.0000 mg | ORAL_TABLET | Freq: Three times a day (TID) | ORAL | 0 refills | Status: DC | PRN

## 2023-03-25 MED ORDER — PROPOFOL 10 MG/ML IV BOLUS
INTRAVENOUS | Status: DC | PRN
  Administered 2023-03-25: 20 mg via INTRAVENOUS
  Administered 2023-03-25: 100 mg via INTRAVENOUS

## 2023-03-25 MED ORDER — PROPOFOL 10 MG/ML IV BOLUS
INTRAVENOUS | Status: AC
  Filled 2023-03-25: qty 20

## 2023-03-25 MED ORDER — ONDANSETRON HCL 4 MG/2ML IJ SOLN
INTRAMUSCULAR | Status: AC
  Filled 2023-03-25: qty 2

## 2023-03-25 MED ORDER — CHLORHEXIDINE GLUCONATE 0.12 % MT SOLN
15.0000 mL | Freq: Once | OROMUCOSAL | Status: AC
  Administered 2023-03-25: 15 mL via OROMUCOSAL

## 2023-03-25 MED ORDER — FENTANYL CITRATE (PF) 100 MCG/2ML IJ SOLN
INTRAMUSCULAR | Status: AC
  Filled 2023-03-25: qty 2

## 2023-03-25 MED ORDER — CEFAZOLIN SODIUM-DEXTROSE 2-4 GM/100ML-% IV SOLN
2.0000 g | INTRAVENOUS | Status: AC
  Administered 2023-03-25: 2 g via INTRAVENOUS
  Filled 2023-03-25: qty 100

## 2023-03-25 MED ORDER — ACETAMINOPHEN 325 MG PO TABS
650.0000 mg | ORAL_TABLET | Freq: Four times a day (QID) | ORAL | Status: DC | PRN
  Administered 2023-03-25: 650 mg via ORAL
  Filled 2023-03-25: qty 2

## 2023-03-25 MED ORDER — DEXAMETHASONE SODIUM PHOSPHATE 10 MG/ML IJ SOLN
INTRAMUSCULAR | Status: DC | PRN
  Administered 2023-03-25: 4 mg via INTRAVENOUS

## 2023-03-25 MED ORDER — ORAL CARE MOUTH RINSE: 15.0000 mL | Freq: Once | OROMUCOSAL | Status: AC

## 2023-03-25 MED ORDER — ONDANSETRON HCL 4 MG/2ML IJ SOLN
INTRAMUSCULAR | Status: DC | PRN
  Administered 2023-03-25: 4 mg via INTRAVENOUS

## 2023-03-25 MED ORDER — FENTANYL CITRATE (PF) 100 MCG/2ML IJ SOLN
INTRAMUSCULAR | Status: DC | PRN
  Administered 2023-03-25 (×2): 25 ug via INTRAVENOUS

## 2023-03-25 MED ORDER — SODIUM CHLORIDE 0.9 % IR SOLN
Status: DC | PRN
  Administered 2023-03-25: 6000 mL

## 2023-03-25 SURGICAL SUPPLY — 19 items
BAG DRN RND TRDRP ANRFLXCHMBR (UROLOGICAL SUPPLIES)
BAG URINE DRAIN 2000ML AR STRL (UROLOGICAL SUPPLIES) IMPLANT
BAG URO CATCHER STRL LF (MISCELLANEOUS) ×1 IMPLANT
CATH URETL OPEN 5X70 (CATHETERS) ×1 IMPLANT
CLOTH BEACON ORANGE TIMEOUT ST (SAFETY) ×1 IMPLANT
DRAPE FOOT SWITCH (DRAPES) ×1 IMPLANT
ELECT REM PT RETURN 15FT ADLT (MISCELLANEOUS) ×1 IMPLANT
GLOVE SURG LX STRL 7.5 STRW (GLOVE) ×1 IMPLANT
GOWN STRL REUS W/ TWL XL LVL3 (GOWN DISPOSABLE) ×1 IMPLANT
GOWN STRL REUS W/TWL XL LVL3 (GOWN DISPOSABLE) ×1
GUIDEWIRE ZIPWRE .038 STRAIGHT (WIRE) ×1 IMPLANT
KIT TURNOVER KIT A (KITS) IMPLANT
LOOP CUT BIPOLAR 24F LRG (ELECTROSURGICAL) IMPLANT
MANIFOLD NEPTUNE II (INSTRUMENTS) ×1 IMPLANT
PACK CYSTO (CUSTOM PROCEDURE TRAY) ×1 IMPLANT
SYR TOOMEY IRRIG 70ML (MISCELLANEOUS)
SYRINGE TOOMEY IRRIG 70ML (MISCELLANEOUS) IMPLANT
TUBING CONNECTING 10 (TUBING) ×1 IMPLANT
TUBING UROLOGY SET (TUBING) ×1 IMPLANT

## 2023-03-25 NOTE — Transfer of Care (Signed)
Immediate Anesthesia Transfer of Care Note  Patient: Shannon Berger  Procedure(s) Performed: TRANSURETHRAL RESECTION OF BLADDER TUMOR (TURBT) (Bladder) CYSTOSCOPY (Bladder)  Patient Location: PACU  Anesthesia Type:General  Level of Consciousness: drowsy and responds to stimulation  Airway & Oxygen Therapy: Patient Spontanous Breathing and Patient connected to face mask oxygen  Post-op Assessment: Report given to RN and Post -op Vital signs reviewed and stable  Post vital signs: Reviewed and stable  Last Vitals:  Vitals Value Taken Time  BP 120/58 03/25/23 1041  Temp    Pulse 85 03/25/23 1043  Resp 26 03/25/23 1043  SpO2 100 % 03/25/23 1043  Vitals shown include unfiled device data.  Last Pain:  Vitals:   03/25/23 0841  TempSrc:   PainSc: 0-No pain         Complications: No notable events documented.

## 2023-03-25 NOTE — Anesthesia Procedure Notes (Signed)
Procedure Name: LMA Insertion Date/Time: 03/25/2023 9:59 AM  Performed by: Sindy Guadeloupe, CRNAPre-anesthesia Checklist: Patient identified, Emergency Drugs available, Suction available, Patient being monitored and Timeout performed Patient Re-evaluated:Patient Re-evaluated prior to induction Oxygen Delivery Method: Circle system utilized Preoxygenation: Pre-oxygenation with 100% oxygen Induction Type: IV induction Ventilation: Mask ventilation without difficulty LMA: LMA inserted LMA Size: 3.0 Number of attempts: 1 Tube secured with: Tape Dental Injury: Teeth and Oropharynx as per pre-operative assessment

## 2023-03-25 NOTE — Anesthesia Postprocedure Evaluation (Signed)
Anesthesia Post Note  Patient: Shannon Berger  Procedure(s) Performed: TRANSURETHRAL RESECTION OF BLADDER TUMOR (TURBT) (Bladder) CYSTOSCOPY (Bladder)     Patient location during evaluation: PACU Anesthesia Type: General Level of consciousness: awake and alert, oriented and patient cooperative Pain management: pain level controlled Vital Signs Assessment: post-procedure vital signs reviewed and stable Respiratory status: spontaneous breathing, nonlabored ventilation and respiratory function stable Cardiovascular status: blood pressure returned to baseline and stable Postop Assessment: no apparent nausea or vomiting Anesthetic complications: no   No notable events documented.  Last Vitals:  Vitals:   03/25/23 1115 03/25/23 1130  BP: 130/66 131/64  Pulse: 76 79  Resp: (!) 21 20  Temp:  36.4 C  SpO2: 98% 99%    Last Pain:  Vitals:   03/25/23 1130  TempSrc:   PainSc: 0-No pain                 Lannie Fields

## 2023-03-25 NOTE — Op Note (Signed)
Operative Note  Preoperative diagnosis:  1.  5 mm papillary bladder tumor  Postoperative diagnosis: 1.  5 mm papillary bladder tumor  Procedure(s): 1.  Cystoscopy with TURBT of 5 mm papillary bladder tumor (small)  Surgeon: Rhoderick Moody, MD  Assistants:  None  Anesthesia:  General  Complications:  None  EBL: Less than 5 mL  Specimens: 1.  Papillary bladder tumor that was located at the 9 o'clock position adjacent to the bladder neck/lateral sidewall  Drains/Catheters: 1.  None  Intraoperative findings:   5 mm papillary bladder tumor that was located at the 9 o'clock position adjacent to the bladder neck/lateral sidewall  Indication:  Shannon Berger is a 82 y.o. 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  She was found to have a bladder tumor recurrence on her most recent cystoscopy and is here today for TURBT.  She has been consented for the above procedures, voices understanding and wishes to proceed.  Description of procedure:  After informed consent was obtained, the patient was brought to the operating room and general LMA anesthesia was administered. The patient was then placed in the dorsolithotomy position and prepped and draped in the usual sterile fashion. A timeout was performed. A 23 French rigid cystoscope was then inserted into the urethral meatus and advanced into the bladder under direct vision. A complete bladder survey revealed 5 mm papillary bladder tumor that was located at the 9 o'clock position adjacent to the bladder neck/lateral sidewall.  No other intravesical abnormalities were seen.  The rigid cystoscope was then exchanged for a 26 French resectoscope with a bipolar loop working element.  The papillary bladder tumor was then resected down to the detrusor musculature.  The tumor specimen was extracted from the lumen of the bladder through the sheath of the cystoscope and sent for permanent section.  The  area of resection was extensively fulgurated until hemostasis was achieved.  Her bladder was drained.  She tolerated the procedure well and was transferred to the postanesthesia in stable condition.  Plan: Follow-up in 1 week to discuss pathology results

## 2023-03-26 ENCOUNTER — Encounter (HOSPITAL_COMMUNITY): Payer: Self-pay | Admitting: Urology

## 2023-03-27 DIAGNOSIS — M25559 Pain in unspecified hip: Secondary | ICD-10-CM | POA: Diagnosis not present

## 2023-03-27 DIAGNOSIS — R531 Weakness: Secondary | ICD-10-CM | POA: Diagnosis not present

## 2023-03-27 DIAGNOSIS — M419 Scoliosis, unspecified: Secondary | ICD-10-CM | POA: Diagnosis not present

## 2023-03-27 DIAGNOSIS — M546 Pain in thoracic spine: Secondary | ICD-10-CM | POA: Diagnosis not present

## 2023-03-31 DIAGNOSIS — C672 Malignant neoplasm of lateral wall of bladder: Secondary | ICD-10-CM | POA: Diagnosis not present

## 2023-03-31 DIAGNOSIS — N390 Urinary tract infection, site not specified: Secondary | ICD-10-CM | POA: Diagnosis not present

## 2023-04-01 ENCOUNTER — Telehealth: Payer: Self-pay | Admitting: Cardiology

## 2023-04-01 NOTE — Telephone Encounter (Signed)
Called patient left Dr.Jordan's advice on personal voice mail.Advised to take Amlodipine 5 mg in pm only.Continue all other medications.Advised continue to monitor B/P and call back if continues to run low.

## 2023-04-01 NOTE — Telephone Encounter (Signed)
Spoke to patient she stated she feels bad. Stated she has not took morning medications yet.Stated B/P at present 92/65 pulse 78.Yesterday B/P before meds 119/84.She wanted to ask Dr.Jordan if she needs to decrease medications.She takes Amlodipine 2.5 mg am and 5 mg pm.Olmesartan 40 mg am.Message sent to Dr.Jordan for advice.

## 2023-04-01 NOTE — Telephone Encounter (Signed)
Patient is calling to tlk with Dr. [place] or nurse regarding her BP

## 2023-04-02 DIAGNOSIS — H47011 Ischemic optic neuropathy, right eye: Secondary | ICD-10-CM | POA: Diagnosis not present

## 2023-04-02 DIAGNOSIS — H54414A Blindness right eye category 4, normal vision left eye: Secondary | ICD-10-CM | POA: Diagnosis not present

## 2023-04-02 DIAGNOSIS — H5460 Unqualified visual loss, one eye, unspecified: Secondary | ICD-10-CM | POA: Diagnosis not present

## 2023-04-03 DIAGNOSIS — M25559 Pain in unspecified hip: Secondary | ICD-10-CM | POA: Diagnosis not present

## 2023-04-03 DIAGNOSIS — M546 Pain in thoracic spine: Secondary | ICD-10-CM | POA: Diagnosis not present

## 2023-04-03 DIAGNOSIS — M419 Scoliosis, unspecified: Secondary | ICD-10-CM | POA: Diagnosis not present

## 2023-04-03 DIAGNOSIS — R531 Weakness: Secondary | ICD-10-CM | POA: Diagnosis not present

## 2023-04-08 DIAGNOSIS — M25559 Pain in unspecified hip: Secondary | ICD-10-CM | POA: Diagnosis not present

## 2023-04-08 DIAGNOSIS — M419 Scoliosis, unspecified: Secondary | ICD-10-CM | POA: Diagnosis not present

## 2023-04-08 DIAGNOSIS — R531 Weakness: Secondary | ICD-10-CM | POA: Diagnosis not present

## 2023-04-08 DIAGNOSIS — M546 Pain in thoracic spine: Secondary | ICD-10-CM | POA: Diagnosis not present

## 2023-04-09 DIAGNOSIS — I129 Hypertensive chronic kidney disease with stage 1 through stage 4 chronic kidney disease, or unspecified chronic kidney disease: Secondary | ICD-10-CM | POA: Diagnosis not present

## 2023-04-09 DIAGNOSIS — F329 Major depressive disorder, single episode, unspecified: Secondary | ICD-10-CM | POA: Diagnosis not present

## 2023-04-09 DIAGNOSIS — Z23 Encounter for immunization: Secondary | ICD-10-CM | POA: Diagnosis not present

## 2023-04-10 DIAGNOSIS — M25559 Pain in unspecified hip: Secondary | ICD-10-CM | POA: Diagnosis not present

## 2023-04-10 DIAGNOSIS — R531 Weakness: Secondary | ICD-10-CM | POA: Diagnosis not present

## 2023-04-10 DIAGNOSIS — M419 Scoliosis, unspecified: Secondary | ICD-10-CM | POA: Diagnosis not present

## 2023-04-10 DIAGNOSIS — M546 Pain in thoracic spine: Secondary | ICD-10-CM | POA: Diagnosis not present

## 2023-04-15 ENCOUNTER — Other Ambulatory Visit: Payer: Self-pay | Admitting: Cardiology

## 2023-04-15 DIAGNOSIS — M25559 Pain in unspecified hip: Secondary | ICD-10-CM | POA: Diagnosis not present

## 2023-04-15 DIAGNOSIS — M419 Scoliosis, unspecified: Secondary | ICD-10-CM | POA: Diagnosis not present

## 2023-04-15 DIAGNOSIS — M546 Pain in thoracic spine: Secondary | ICD-10-CM | POA: Diagnosis not present

## 2023-04-15 DIAGNOSIS — R531 Weakness: Secondary | ICD-10-CM | POA: Diagnosis not present

## 2023-04-17 DIAGNOSIS — M25559 Pain in unspecified hip: Secondary | ICD-10-CM | POA: Diagnosis not present

## 2023-04-17 DIAGNOSIS — F331 Major depressive disorder, recurrent, moderate: Secondary | ICD-10-CM | POA: Diagnosis not present

## 2023-04-17 DIAGNOSIS — M546 Pain in thoracic spine: Secondary | ICD-10-CM | POA: Diagnosis not present

## 2023-04-17 DIAGNOSIS — M419 Scoliosis, unspecified: Secondary | ICD-10-CM | POA: Diagnosis not present

## 2023-04-17 DIAGNOSIS — R531 Weakness: Secondary | ICD-10-CM | POA: Diagnosis not present

## 2023-04-17 DIAGNOSIS — F411 Generalized anxiety disorder: Secondary | ICD-10-CM | POA: Diagnosis not present

## 2023-04-18 DIAGNOSIS — M25512 Pain in left shoulder: Secondary | ICD-10-CM | POA: Diagnosis not present

## 2023-04-22 DIAGNOSIS — M419 Scoliosis, unspecified: Secondary | ICD-10-CM | POA: Diagnosis not present

## 2023-04-22 DIAGNOSIS — M25559 Pain in unspecified hip: Secondary | ICD-10-CM | POA: Diagnosis not present

## 2023-04-22 DIAGNOSIS — R531 Weakness: Secondary | ICD-10-CM | POA: Diagnosis not present

## 2023-04-22 DIAGNOSIS — M546 Pain in thoracic spine: Secondary | ICD-10-CM | POA: Diagnosis not present

## 2023-04-24 DIAGNOSIS — M419 Scoliosis, unspecified: Secondary | ICD-10-CM | POA: Diagnosis not present

## 2023-04-24 DIAGNOSIS — M546 Pain in thoracic spine: Secondary | ICD-10-CM | POA: Diagnosis not present

## 2023-04-24 DIAGNOSIS — M25559 Pain in unspecified hip: Secondary | ICD-10-CM | POA: Diagnosis not present

## 2023-04-24 DIAGNOSIS — R531 Weakness: Secondary | ICD-10-CM | POA: Diagnosis not present

## 2023-04-30 DIAGNOSIS — H5460 Unqualified visual loss, one eye, unspecified: Secondary | ICD-10-CM | POA: Diagnosis not present

## 2023-04-30 DIAGNOSIS — H47011 Ischemic optic neuropathy, right eye: Secondary | ICD-10-CM | POA: Diagnosis not present

## 2023-05-01 DIAGNOSIS — M419 Scoliosis, unspecified: Secondary | ICD-10-CM | POA: Diagnosis not present

## 2023-05-01 DIAGNOSIS — R531 Weakness: Secondary | ICD-10-CM | POA: Diagnosis not present

## 2023-05-01 DIAGNOSIS — M25559 Pain in unspecified hip: Secondary | ICD-10-CM | POA: Diagnosis not present

## 2023-05-01 DIAGNOSIS — M546 Pain in thoracic spine: Secondary | ICD-10-CM | POA: Diagnosis not present

## 2023-05-06 DIAGNOSIS — M25559 Pain in unspecified hip: Secondary | ICD-10-CM | POA: Diagnosis not present

## 2023-05-06 DIAGNOSIS — M546 Pain in thoracic spine: Secondary | ICD-10-CM | POA: Diagnosis not present

## 2023-05-06 DIAGNOSIS — R531 Weakness: Secondary | ICD-10-CM | POA: Diagnosis not present

## 2023-05-06 DIAGNOSIS — M419 Scoliosis, unspecified: Secondary | ICD-10-CM | POA: Diagnosis not present

## 2023-05-08 DIAGNOSIS — M419 Scoliosis, unspecified: Secondary | ICD-10-CM | POA: Diagnosis not present

## 2023-05-08 DIAGNOSIS — M25559 Pain in unspecified hip: Secondary | ICD-10-CM | POA: Diagnosis not present

## 2023-05-08 DIAGNOSIS — R531 Weakness: Secondary | ICD-10-CM | POA: Diagnosis not present

## 2023-05-08 DIAGNOSIS — M546 Pain in thoracic spine: Secondary | ICD-10-CM | POA: Diagnosis not present

## 2023-05-09 DIAGNOSIS — R0982 Postnasal drip: Secondary | ICD-10-CM | POA: Diagnosis not present

## 2023-05-09 DIAGNOSIS — R0981 Nasal congestion: Secondary | ICD-10-CM | POA: Diagnosis not present

## 2023-05-09 DIAGNOSIS — J029 Acute pharyngitis, unspecified: Secondary | ICD-10-CM | POA: Diagnosis not present

## 2023-05-13 DIAGNOSIS — C671 Malignant neoplasm of dome of bladder: Secondary | ICD-10-CM | POA: Diagnosis not present

## 2023-05-13 DIAGNOSIS — R8271 Bacteriuria: Secondary | ICD-10-CM | POA: Diagnosis not present

## 2023-05-13 DIAGNOSIS — Z5111 Encounter for antineoplastic chemotherapy: Secondary | ICD-10-CM | POA: Diagnosis not present

## 2023-05-14 DIAGNOSIS — Z1231 Encounter for screening mammogram for malignant neoplasm of breast: Secondary | ICD-10-CM | POA: Diagnosis not present

## 2023-05-15 DIAGNOSIS — R531 Weakness: Secondary | ICD-10-CM | POA: Diagnosis not present

## 2023-05-15 DIAGNOSIS — M546 Pain in thoracic spine: Secondary | ICD-10-CM | POA: Diagnosis not present

## 2023-05-15 DIAGNOSIS — Z961 Presence of intraocular lens: Secondary | ICD-10-CM | POA: Diagnosis not present

## 2023-05-15 DIAGNOSIS — M419 Scoliosis, unspecified: Secondary | ICD-10-CM | POA: Diagnosis not present

## 2023-05-15 DIAGNOSIS — M25559 Pain in unspecified hip: Secondary | ICD-10-CM | POA: Diagnosis not present

## 2023-05-15 DIAGNOSIS — H40002 Preglaucoma, unspecified, left eye: Secondary | ICD-10-CM | POA: Diagnosis not present

## 2023-05-15 DIAGNOSIS — H47011 Ischemic optic neuropathy, right eye: Secondary | ICD-10-CM | POA: Diagnosis not present

## 2023-05-15 DIAGNOSIS — H35371 Puckering of macula, right eye: Secondary | ICD-10-CM | POA: Diagnosis not present

## 2023-05-16 ENCOUNTER — Encounter: Payer: Self-pay | Admitting: Obstetrics and Gynecology

## 2023-05-20 DIAGNOSIS — Z5111 Encounter for antineoplastic chemotherapy: Secondary | ICD-10-CM | POA: Diagnosis not present

## 2023-05-20 DIAGNOSIS — C671 Malignant neoplasm of dome of bladder: Secondary | ICD-10-CM | POA: Diagnosis not present

## 2023-05-22 DIAGNOSIS — M25559 Pain in unspecified hip: Secondary | ICD-10-CM | POA: Diagnosis not present

## 2023-05-22 DIAGNOSIS — R531 Weakness: Secondary | ICD-10-CM | POA: Diagnosis not present

## 2023-05-22 DIAGNOSIS — M546 Pain in thoracic spine: Secondary | ICD-10-CM | POA: Diagnosis not present

## 2023-05-22 DIAGNOSIS — M419 Scoliosis, unspecified: Secondary | ICD-10-CM | POA: Diagnosis not present

## 2023-05-23 ENCOUNTER — Encounter (HOSPITAL_COMMUNITY): Payer: Self-pay | Admitting: Emergency Medicine

## 2023-05-23 ENCOUNTER — Other Ambulatory Visit: Payer: Self-pay

## 2023-05-23 ENCOUNTER — Emergency Department (HOSPITAL_COMMUNITY): Payer: Medicare Other

## 2023-05-23 ENCOUNTER — Emergency Department (HOSPITAL_COMMUNITY)
Admission: EM | Admit: 2023-05-23 | Discharge: 2023-05-23 | Disposition: A | Discharge status: Home / Self Care | Payer: Medicare Other | Attending: Emergency Medicine | Admitting: Emergency Medicine

## 2023-05-23 DIAGNOSIS — I700 Atherosclerosis of aorta: Secondary | ICD-10-CM | POA: Diagnosis not present

## 2023-05-23 DIAGNOSIS — J439 Emphysema, unspecified: Secondary | ICD-10-CM | POA: Diagnosis not present

## 2023-05-23 DIAGNOSIS — Z7982 Long term (current) use of aspirin: Secondary | ICD-10-CM | POA: Diagnosis not present

## 2023-05-23 DIAGNOSIS — I10 Essential (primary) hypertension: Secondary | ICD-10-CM | POA: Diagnosis not present

## 2023-05-23 DIAGNOSIS — R079 Chest pain, unspecified: Secondary | ICD-10-CM

## 2023-05-23 DIAGNOSIS — Z79899 Other long term (current) drug therapy: Secondary | ICD-10-CM | POA: Diagnosis not present

## 2023-05-23 DIAGNOSIS — I771 Stricture of artery: Secondary | ICD-10-CM | POA: Diagnosis not present

## 2023-05-23 DIAGNOSIS — I2510 Atherosclerotic heart disease of native coronary artery without angina pectoris: Secondary | ICD-10-CM | POA: Diagnosis not present

## 2023-05-23 DIAGNOSIS — F419 Anxiety disorder, unspecified: Secondary | ICD-10-CM

## 2023-05-23 DIAGNOSIS — R457 State of emotional shock and stress, unspecified: Secondary | ICD-10-CM | POA: Diagnosis not present

## 2023-05-23 DIAGNOSIS — R072 Precordial pain: Secondary | ICD-10-CM | POA: Insufficient documentation

## 2023-05-23 DIAGNOSIS — R000 Tachycardia, unspecified: Secondary | ICD-10-CM | POA: Diagnosis not present

## 2023-05-23 LAB — CBC
HCT: 38.4 % (ref 36.0–46.0)
Hemoglobin: 12.8 g/dL (ref 12.0–15.0)
MCH: 32.7 pg (ref 26.0–34.0)
MCHC: 33.3 g/dL (ref 30.0–36.0)
MCV: 98.2 fL (ref 80.0–100.0)
Platelets: 349 10*3/uL (ref 150–400)
RBC: 3.91 MIL/uL (ref 3.87–5.11)
RDW: 12.6 % (ref 11.5–15.5)
WBC: 10.3 10*3/uL (ref 4.0–10.5)
nRBC: 0 % (ref 0.0–0.2)

## 2023-05-23 LAB — BASIC METABOLIC PANEL
Anion gap: 9 (ref 5–15)
BUN: 28 mg/dL — ABNORMAL HIGH (ref 8–23)
CO2: 24 mmol/L (ref 22–32)
Calcium: 9.6 mg/dL (ref 8.9–10.3)
Chloride: 107 mmol/L (ref 98–111)
Creatinine, Ser: 0.87 mg/dL (ref 0.44–1.00)
GFR, Estimated: >60 mL/min (ref >60)
Glucose, Bld: 101 mg/dL — ABNORMAL HIGH (ref 70–99)
Potassium: 4 mmol/L (ref 3.5–5.1)
Sodium: 140 mmol/L (ref 135–145)

## 2023-05-23 LAB — TROPONIN I (HIGH SENSITIVITY)
Troponin I (High Sensitivity): 16 ng/L (ref <18)
Troponin I (High Sensitivity): 14 ng/L (ref <18)

## 2023-05-23 MED ORDER — LORAZEPAM 1 MG PO TABS
0.5000 mg | ORAL_TABLET | Freq: Once | ORAL | Status: AC
  Administered 2023-05-23: 0.5 mg via ORAL
  Filled 2023-05-23: qty 1

## 2023-05-23 NOTE — Discharge Instructions (Signed)
Please read and follow all provided instructions.  Your diagnoses today include:  1. Precordial pain    Tests performed today include: An EKG of your heart: was slightly different than previous but no definite heart attack  A chest x-ray Cardiac enzymes - a blood test for heart muscle damage were stable and normal, no signs of a heart attack Blood counts and electrolytes Vital signs. See below for your results today.   Medications prescribed:  None  Take any prescribed medications only as directed.  Follow-up instructions: Please follow-up with cardiologist as planned.   Return instructions:  SEEK IMMEDIATE MEDICAL ATTENTION IF: You have severe chest pain, especially if the pain is crushing or pressure-like and spreads to the arms, back, neck, or jaw, or if you have sweating, nausea or vomiting, or trouble with breathing. THIS IS AN EMERGENCY. Do not wait to see if the pain will go away. Get medical help at once. Call 911. DO NOT drive yourself to the hospital.  Your chest pain gets worse and does not go away after a few minutes of rest.  You have an attack of chest pain lasting longer than what you usually experience.  You have significant dizziness, if you pass out, or have trouble walking.  You have chest pain not typical of your usual pain for which you originally saw your caregiver.  You have any other emergent concerns regarding your health.  Additional Information: Chest pain comes from many different causes. Your caregiver has diagnosed you as having chest pain that is not specific for one problem, but does not require admission.  You are at low risk for an acute heart condition or other serious illness.   Your vital signs today were: BP (!) 150/79 (BP Location: Right Arm)   Pulse 75   Temp 98 F (36.7 C) (Oral)   Resp 15   Wt 47.6 kg   SpO2 100%   BMI 19.52 kg/m  If your blood pressure (BP) was elevated above 135/85 this visit, please have this repeated by your  doctor within one month. --------------

## 2023-05-23 NOTE — Progress Notes (Signed)
Cardiology Office Note:  .   Date:  05/23/2023  ID:  Shannon Berger, DOB Nov 18, 1940, MRN [id number] PCP: Shannon Ran, MD   HeartCare Providers Cardiologist:  Shannon Swaziland, MD   History of Present Illness: Shannon Berger Kitchen   Shannon Berger is a 82 y.o. female  with a hx of carotid artery disease s/p left CEA, HTN, HLD, ischemic optic neuropathy, osteopenia. Patient is followed by Dr. Swaziland and presents today for an ED follow up appointment  Per chart review, patient was previously seen in 2016 for evaluation of chest pain.  Underwent nuclear stress test on 03/24/2015 that showed EF 68%, normal perfusion, overall low risk study.  She was later seen in 2019 with symptoms of dyspnea on exertion.  Underwent coronary CTA on 06/01/18 that showed a coronary calcium score of 260 (73rd percentile), nonobstructive CAD.  She was later admitted in 05/2018 with aphasia, left-sided weakness.  CT and MRI showed no acute findings, carotid Doppler showed progression of carotid artery disease with greater than 80% left carotid stenosis.  She underwent left CEA with Dr. Durwin Berger on 05/26/2018 without complications.   Patient again seen in 08/2021 with atypical chest pain.  Later in 12/2021, she had recurrent chest pain.  Underwent coronary CTA on 03/06/2022 that showed a coronary calcium score of 326 (72nd percentile), moderate plaque in the RCA, mild plaque in the LAD.  Study was sent for [facility] which was consistent with nonobstructive CAD.   Patient's carotid artery disease is followed by Dr. Edilia Berger with vascular surgery.  Most recent carotid Dopplers from 09/2022 showed 1-39% stenosis in bilateral ICAs.  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  This has been asymptomatic so has been managed conservatively. Patient was last seen by Dr. Swaziland on 03/21/23. At that time, patient reported that she has been having low BP  readings, sometimes into the 80s systolic. Her lasix was stopped as was her hydralazine. She was seeing Pharm D for BP medication adjustments.   She was seen in the ED on 11/15 and complained of chest pain that began the night prior. Chest pain was associated with emotional stress, relieved by Xanax or when she would relax. She did not have exertional chest pain. Reported that she had been having chest pain with emotional stress for years. hsTn negative x2. EKG was without ischemic changes. Patient was discharged from the ED with cardiology follow up   Today, patient presented for an ED follow up appointment. Reports that she has been having episodes of chest pain associated with emotional stress for years, and has been worked up multiple times in the past. When she went to the ED on 11/15, she was concerned that the pain as lasting longer than usual and had spread across the chest. She decided to get worked up. Admits that she had not been taking her anxiety medications and was very stressed when she started to feel the chest pain. Since being discharged from the ED, she has not had any episodes of intense or long lasting chest pain. Reports that over the years, her chest pain has been stable and is always associated with emotional stress. She denies exertional chest pain, and is able to exercise at the [facility] without chest pain.   Today, her BP was the lower end of normal at 102/62. She reported occasional feelings of weakness, which she associated with low blood pressure. However, she also  noted an overall improvement in her blood pressure control at home. Denies syncope, near syncope, dizziness, lightheadedness.   The patient has a history of vascular disease, with previous intervention in the neck. She reported no new symptoms related to this condition. She is currently on a regimen of amlodipine, Benicar, Lipitor, Zetia, and Repatha, which has resulted in well-controlled cholesterol levels. She also  takes aspirin 325 mg every evening.   The patient's symptoms appear to be stable, with no significant changes reported since her last evaluation. She expressed a preference to avoid further testing at this time, opting to discuss her symptoms with her regular provider at a scheduled appointment in four months.   ROS: Denies shortness of breath, dizziness, syncope, near syncope, ankle swelling.   Studies Reviewed: .   Cardiac Studies & Procedures     STRESS TESTS  MYOCARDIAL PERFUSION IMAGING 03/24/2015  Narrative  The left ventricular ejection fraction is hyperdynamic (>65%).  Nuclear stress EF: 68%.  There was no ST segment deviation noted during stress.  The study is normal.  This is a low risk study.  Normal myocardial perfusion and function  Low risk lexiscan nuclear stress test demonstrating normal myocardial perfusion and function; EF 68%,   ECHOCARDIOGRAM  ECHOCARDIOGRAM COMPLETE 05/21/2018  Narrative *[facility]* *[facility]* [address] [place], [place] [address] [phone]  ------------------------------------------------------------------- Transthoracic Echocardiography  Patient:    [name], [name] MR #:       [id number] Study Date: 05/21/2018 Gender:     F Age:        52 Height:     153.7 cm Weight:     54.7 kg BSA:        1.54 m^2 Pt. Status: Room:       3W07C  SONOGRAPHER  Lavenia Atlas, RCS ADMITTING    Lorretta Harp [id number]     UVO, ZDGUY [id number] Rene Paci [id number] PERFORMING   [facility], Inpatient ATTENDING    Stephania Fragmin Chirag  cc:  ------------------------------------------------------------------- LV EF: 60% -   65%  ------------------------------------------------------------------- Indications:      TIA 435.9.  ------------------------------------------------------------------- History:   Risk factors:  Hypertension.  Dyslipidemia.  ------------------------------------------------------------------- Study Conclusions  - Left ventricle: The cavity size was normal. Wall thickness was normal. Systolic function was normal. The estimated ejection fraction was in the range of 60% to 65%. Wall motion was normal; there were no regional wall motion abnormalities. Features are consistent with a pseudonormal left ventricular filling pattern, with concomitant abnormal relaxation and increased filling pressure (grade 2 diastolic dysfunction). - Mitral valve: There was mild regurgitation. - Pulmonary arteries: Systolic pressure was mildly increased. PA peak pressure: 34 mm Hg (S).  ------------------------------------------------------------------- Study data:  Comparison was made to the study of 04/13/2018.  Study status:  Routine.  Procedure:  The patient reported no pain pre or post test. Transthoracic echocardiography. Image quality was adequate.  Study completion:  There were no complications. Transthoracic echocardiography.  M-mode, complete 2D, spectral Doppler, and color Doppler.  Birthdate:  Patient birthdate: [birth year].  Age:  Patient is 82 yr old.  Sex:  Gender: female. BMI: 23.2 kg/m^2.  Blood pressure:     143/66  Patient status: Inpatient.  Study date:  Study date: 05/21/2018. Study time: 08:50 AM.  Location:  Echo laboratory.  -------------------------------------------------------------------  ------------------------------------------------------------------- Left ventricle:  The cavity size was normal. Wall thickness was normal. Systolic function was normal. The estimated ejection fraction was in the range of  60% to 65%. Wall motion was normal; there were no regional wall motion abnormalities. Features are consistent with a pseudonormal left ventricular filling pattern, with concomitant abnormal relaxation and increased filling pressure (grade 2 diastolic  dysfunction).  ------------------------------------------------------------------- Aortic valve:   Structurally normal valve.   Cusp separation was normal.  Doppler:  Transvalvular velocity was within the normal range. There was no stenosis. There was no regurgitation.  ------------------------------------------------------------------- Aorta:  Aortic root: The aortic root was normal in size. Ascending aorta: The ascending aorta was normal in size.  ------------------------------------------------------------------- Mitral valve:   Structurally normal valve.   Leaflet separation was normal.  Doppler:  Transvalvular velocity was within the normal range. There was no evidence for stenosis. There was mild regurgitation.    Valve area by pressure half-time: 3.19 cm^2. Indexed valve area by pressure half-time: 2.08 cm^2/m^2.    Peak gradient (D): 4 mm Hg.  ------------------------------------------------------------------- Left atrium:  The atrium was normal in size.  ------------------------------------------------------------------- Right ventricle:  The cavity size was normal. Systolic function was normal.  ------------------------------------------------------------------- Pulmonic valve:    The valve appears to be grossly normal. Doppler:  There was no significant regurgitation.  ------------------------------------------------------------------- Tricuspid valve:   The valve appears to be grossly normal. Doppler:  There was mild regurgitation.  ------------------------------------------------------------------- Pulmonary artery:   Systolic pressure was mildly increased.  ------------------------------------------------------------------- Right atrium:  The atrium was normal in size.  ------------------------------------------------------------------- Pericardium:  There was no pericardial  effusion.  ------------------------------------------------------------------- Measurements  Left ventricle                           Value          Reference LV ID, ED, PLAX chordal          (L)     40    mm       43 - 52 LV ID, ES, PLAX chordal                  26    mm       23 - 38 LV fx shortening, PLAX chordal           35    %        >=29 LV PW thickness, ED                      8     mm       ---------- IVS/LV PW ratio, ED                      1.13           <=1.3 Stroke volume, 2D                        52    ml       ---------- Stroke volume/bsa, 2D                    34    ml/m^2   ---------- LV ejection fraction, 1-p A4C            59    %        ---------- LV e&', lateral                           7.07  cm/s     ----------  LV E/e&', lateral                         13.47          ---------- LV e&', medial                            7.51  cm/s     ---------- LV E/e&', medial                          12.68          ---------- LV e&', average                           7.29  cm/s     ---------- LV E/e&', average                         13.06          ----------  Ventricular septum                       Value          Reference IVS thickness, ED                        9     mm       ----------  LVOT                                     Value          Reference LVOT ID, S                               19    mm       ---------- LVOT area                                2.84  cm^2     ---------- LVOT peak velocity, S                    85.8  cm/s     ---------- LVOT mean velocity, S                    56.8  cm/s     ---------- LVOT VTI, S                              18.3  cm       ---------- LVOT peak gradient, S                    3     mm Hg    ----------  Aortic valve                             Value          Reference Aortic regurg pressure half-time         536   ms       ----------  Aorta                                    Value          Reference Aortic root ID, ED                        27    mm       ----------  Left atrium                              Value          Reference LA ID, A-P, ES                           36    mm       ---------- LA ID/bsa, A-P                   (H)     2.34  cm/m^2   <=2.2 LA volume, S                             28.2  ml       ---------- LA volume/bsa, S                         18.4  ml/m^2   ---------- LA volume, ES, 1-p A4C                   27.8  ml       ---------- LA volume/bsa, ES, 1-p A4C               18.1  ml/m^2   ---------- LA volume, ES, 1-p A2C                   28.9  ml       ---------- LA volume/bsa, ES, 1-p A2C               18.8  ml/m^2   ----------  Mitral valve                             Value          Reference Mitral E-wave peak velocity              95.2  cm/s     ---------- Mitral A-wave peak velocity              62.9  cm/s     ---------- Mitral deceleration time         (H)     236   ms       150 - 230 Mitral pressure half-time                69    ms       ---------- Mitral peak gradient, D                  4     mm Hg    ---------- Mitral E/A ratio, peak                   1.5            ----------  Mitral valve area, PHT, DP               3.19  cm^2     ---------- Mitral valve area/bsa, PHT, DP           2.08  cm^2/m^2 ----------  Pulmonary arteries                       Value          Reference PA pressure, S, DP               (H)     34    mm Hg    <=30  Tricuspid valve                          Value          Reference Tricuspid regurg peak velocity           279   cm/s     ---------- Tricuspid peak RV-RA gradient            31    mm Hg    ----------  Right atrium                             Value          Reference RA ID, S-I, ES, A4C                      42.1  mm       34 - 49 RA area, ES, A4C                         10.4  cm^2     8.3 - 19.5 RA volume, ES, A/L                       20.9  ml       ---------- RA volume/bsa, ES, A/L                   13.6  ml/m^2   ----------  Systemic  veins                           Value          Reference Estimated CVP                            3     mm Hg    ----------  Right ventricle                          Value          Reference RV ID, minor axis, ED, A4C base          18    mm       ---------- TAPSE                                    29.4  mm       ---------- RV pressure, S, DP               (H)  34    mm Hg    <=30 RV s&', lateral, S                        9.9   cm/s     ----------  Legend: (L)  and  (H)  mark values outside specified reference range.  ------------------------------------------------------------------- Prepared and Electronically Authenticated by  Kristeen Miss, M.D. 2019-11-14T13:02:59     CT SCANS  CT CORONARY MORPH W/CTA COR W/SCORE 02/07/2022  Addendum 02/07/2022  2:57 PM ADDENDUM REPORT: 02/07/2022 14:55  EXAM: OVER-READ INTERPRETATION  CT CHEST  The following report is a limited chest CT over-read performed by radiologist Dr. Irma Newness [facility] on 02/07/2022. This over-read does not include interpretation of cardiac or coronary anatomy or pathology. The coronary CTA interpretation by the cardiologist is attached.  COMPARISON:  Chest CTA 02/06/2021  FINDINGS: Mediastinum/Nodes: No enlarged lymph nodes within the visualized mediastinum.  Lungs/Pleura: There is no pleural effusion. Stable mild bronchiectasis and scarring, primarily within the right middle lobe and lingula. No airspace disease or suspicious pulmonary nodule.  Upper abdomen: The visualized upper abdomen appears stable without significant findings. Probable cyst in the dome of the right hepatic lobe appears unchanged.  Musculoskeletal/Chest wall: No chest wall mass or suspicious osseous findings within the visualized chest. Stable chronic compression deformities at T11 and T12.  IMPRESSION: No significant extracardiac findings within the visualized chest.   Electronically Signed By: Carey Bullocks M.D. On: 02/07/2022 14:55  Narrative CLINICAL DATA:  18F with hypertension, hyperlipidemia, carotid stenosis, and dyspnea.  EXAM: Cardiac/Coronary  CT  TECHNIQUE: The patient was scanned on a [employer].  FINDINGS: A 120 kV prospective scan was triggered in the descending thoracic aorta at 111 HU's. Axial non-contrast 3 mm slices were carried out through the heart. The data set was analyzed on a dedicated work station and scored using the Agatson method. Gantry rotation speed was 250 msecs and collimation was .6 mm. No beta blockade and 0.8 mg of sl NTG was given. The 3D data set was reconstructed in 5% intervals of the 67-82 % of the R-R cycle. Diastolic phases were analyzed on a dedicated work station using MPR, MIP and VRT modes. The patient received 80 cc of contrast.  Aorta: Normal size.  No calcifications.  No dissection.  Aortic Valve:  Trileaflet.  No calcifications.  Coronary Arteries:  Normal coronary origin.  Right dominance.  RCA is a large dominant artery that gives rise to PDA and PLVB. There is moderate(50-69%) calcified plaque in the proximal RCA and minimal (<25%) calcified plaque in the mid vessel.  Left main is a large artery that gives rise to LAD and LCX arteries.  LAD is a large vessel that has mild (25-49%) calcified plaque proximally and minimal (<25%) soft plaque distally. D1 is a small vessel without plaque.  LCX is a non-dominant artery.  There is no plaque.  Coronary Calcium Score:  Left main: 0  Left anterior descending artery: 223  Left circumflex artery: 0  Right coronary artery: 104  Total: 326  Percentile: 72nd  Other findings:  Normal pulmonary vein drainage into the left atrium.  Normal let atrial appendage without a thrombus.  Normal size of the pulmonary artery.  Mild mitral annular calcification.  IMPRESSION: 1. Coronary calcium score of 326. This was 72nd percentile for age-, race-, and  sex-matched controls.  2. Normal coronary origin with right dominance.  3. Moderate (50-69%) plaque  in the RCA and mild (25-49%) plaque in the LAD. CAD-RADS 3.  4.  Will send study for FFRct.  Interpretation of the non-cardiac intrathoracic structures by Radiology is pending.  Chilton Si, MD  Electronically Signed: By: Chilton Si M.D. On: 02/07/2022 13:17   CT SCANS  CT CORONARY MORPH W/CTA COR W/SCORE 06/01/2018  Addendum 06/01/2018 10:23 PM ADDENDUM REPORT: 06/01/2018 22:20  CLINICAL DATA:  Chest pain  EXAM: Cardiac CTA  MEDICATIONS: Sub lingual nitro. 4mg  x 2  TECHNIQUE: The patient was scanned on a Siemens 192 slice scanner. Gantry rotation speed was 250 msecs. Collimation was 0.6 mm. A 100 kV prospective scan was triggered in the ascending thoracic aorta at 35-75% of the R-R interval. Average HR during the scan was 60 bpm. The 3D data set was interpreted on a dedicated work station using MPR, MIP and VRT modes. A total of 80cc of contrast was used.  FINDINGS: Non-cardiac: See separate report from [facility].  The pulmonary veins drained normally to the left atrium.  Calcium Score: 260 Agatston units.  Coronary Arteries: Right dominant with no anomalies  LM: No plaque or stenosis.  LAD system: Calcified plaque proximal LAD with mild (<50%) stenosis.  Circumflex system: Moderate ramus with no significant disease. The AV LCx was relatively small with no plaque or stenosis.  RCA system: Calcified plaque with mild stenosis (<50%) in the proximal RCA.  IMPRESSION: 1. Coronary artery calcium score 260 Agatston units. This places the patient in the 73rd percentile for age and gender, suggesting intermediate risk for future cardiac events.  2.  Nonobstructive CAD.  Dalton Mclean   Electronically Signed By: Marca Ancona M.D. On: 06/01/2018 22:20  Narrative EXAM: OVER-READ INTERPRETATION  CT CHEST  The following report  is an over-read performed by radiologist Dr. Charlett Nose of [facility] on 06/01/2018. This over-read does not include interpretation of cardiac or coronary anatomy or pathology. The coronary CTA interpretation by the cardiologist is attached.  COMPARISON:  Chest CT 12/26/2015  FINDINGS: Vascular: Tortuous aorta.  No aneurysm.  Heart is normal size.  Mediastinum/Nodes: No adenopathy in the lower mediastinum or hila.  Lungs/Pleura: No confluent opacities or effusions.  Upper Abdomen: Small low-density lesion in the right hepatic lobe is stable most compatible with cyst. No acute findings  Musculoskeletal: Chest wall soft tissues are unremarkable. No acute bony abnormality.  IMPRESSION: No acute extra cardiac abnormality.  Tortuous aorta.  Electronically Signed: By: Charlett Nose M.D. On: 06/01/2018 11:45          Risk Assessment/Calculations:             Physical Exam:   VS:  There were no vitals taken for this visit.   Wt Readings from Last 3 Encounters:  05/23/23 105 lb (47.6 kg)  03/25/23 106 lb 9.6 oz (48.4 kg)  03/21/23 102 lb (46.3 kg)    GEN: Well nourished, well developed in no acute distress. Sitting comfortably in the chair  NECK: No JVD; No carotid bruits CARDIAC:  RRR, no murmurs, rubs, gallops. Radial pulses 2+ bilaterally  RESPIRATORY:  Clear to auscultation without rales, wheezing or rhonchi  ABDOMEN: Soft, non-tender, non-distended EXTREMITIES:  No edema in BLE; No deformity   ASSESSMENT AND PLAN: .    Chest Pain  - Patient reports having chest pain that worsens with anxiety, improves with Xanax or when she relaxes. She has been having this chest pain with anxiety for several years. Was seen in the ED on  11/15 with chest pain that was worse than usual. Chest pain occurred when she was very anxious and improved when she took a xanax. hsTn negative x2. EKG nonischemic  - Patient denies recent chest pain or shortness of breath on exertion.  Able to exercise at the [facility] without chest pain  - Patient has been having chest pain when she gets anxious for several years- underwent coronary CTA 02/2022 that showed nonobstructive CAD. Symptoms have overall been stable since that time - Offered cardiac PET. Discussed that her symptoms are atypical and seem to be a symptom of her anxiety. Improve with anxiety medication. Symptoms have been stable over time, and her workup in the past has been reassuring. Patient prefers to hold off on cardiac pet at this time, which I agree is reasonable. Discussed that if her symptoms increase in frequency or intensity, she should let us know and we can further discuss testing  - She has appointment with Dr. Swaziland in 4 months    CAD on CT scan  - Coronary CTA from 02/2022 showed coronary calcium score of 326 (72nd percentile), moderate plaque in the RCA and mild plaque in the LAD. FFR consistent with non-obstructive CAD  - Continue repatha, zetia, lipitor, aspirin  - Of note, patient has been taking 325 mg aspirin daily. Decrease to 81 mg daily    Carotid Artery Disease  - S/p left CEA in 2019 - Followed by vascular surgery - Dopplers from 09/2022 showed 1-39% stenosis bilaterally  - Continue repatha, xetia, lipitor, aspirin   HTN  - BP well controlled. Denies symptoms of hypotension  - Continue amlodipine 5 mg daily and olmesartan 40 mg daily    HLD   - Lipid panel from 07/2022 showed LD 27  - Continue repatha, zetia 10 mg daily, lipitor 40 mg daily     Mesenteric Stenosis  - Followed by vascular surgery     Dispo: Follow up in 4 months with Dr. Swaziland   Signed, Jonita Albee, PA-C

## 2023-05-23 NOTE — ED Provider Notes (Signed)
82 year old female history of carotid artery disease, chest pain, hypertension presents today complaining of chest pain that began yesterday around 4 PM.  It was across the anterior chest and mild in nature but present mostly through the night.  She woke up around 330 and pain was approximately 2 out of 10.  She took 2 nitroglycerin without any real change.  This morning she called EMS to bring her to the hospital.  She states that the relief of having EMS bring her here seem to resolve the pain by time she arrived here she did not have any pain EKG was reviewed and interpreted on arrival.  There were some mild diffuse ST changes.  First troponin is negative. Repeat EKG shows a decrease in ST changes. Repeat troponin is pending.  Plan cardiology is to see and evaluate Heart rate 88 blood pressure 130/64 oxygen saturation is 98% Patient sitting in bed does not appear to be in any acute distress heart is regular rate and rhythm Lungs are clear to auscultation   Margarita Grizzle, MD 05/23/23 1554

## 2023-05-23 NOTE — ED Provider Notes (Signed)
[place] EMERGENCY DEPARTMENT AT [facility] Provider Note   CSN: [id number] Arrival date & time: 05/23/23  4098     History  Chief Complaint  Patient presents with   Chest Pain    Shannon Berger is a 82 y.o. female.  Patient with past medical history of carotid artery disease, hypertension, hyperlipidemia, ischemic optic neuropathy, and osteopenia.  She has had a carotid endarterectomy and is on 325 mg aspirin daily, last took this morning.  She sees Dr. [place]il[place] with cardiology.  Most recent workup was coronary CT in August 2024 which showed nonobstructive coronary artery disease.  Patient states that she went to water aerobics yesterday finishing about 3:30 PM.  At around 4 PM while eating, she developed some chest discomfort.  This radiated into her upper back.  Pain is reportedly similar to previous episodes, however this episode was longer lasting.  Patient reports a lot of anxiety and worrying.  Her pain eased off sometime around 7 PM, but returned later in the evening.  She had a lot of difficulty sleeping due to the discomfort which made her symptoms worse.  Around 3:30 AM she took a nitroglycerin.  She states that in general this helps her feel better.  No lightheadedness or syncope.  She did take her baby aspirin this morning.  She called her PCP for recommendations and was encouraged to come to the hospital.  She is currently asymptomatic, denies chest pain.  She denies associated diaphoresis or vomiting.  She denies any exertional symptoms.  Pain did not radiate into the arms or the neck.  Patient also reports 2 episodes over the past several weeks where she woke in the middle the night and felt like her left leg was weak.  She had difficulty getting out of bed.  She states that she could move it it would just "give out".  This resolved spontaneously.  No lightheadedness or syncope.       Home Medications Prior to Admission medications   Medication Sig  Start Date End Date Taking? Authorizing Provider  acetaminophen (TYLENOL) 500 MG tablet Take 500 mg by mouth every 6 (six) hours as needed for moderate pain or headache.    [provider]  ALPRAZolam Prudy Feeler) 0.5 MG tablet Take 0.25-0.5 mg by mouth at bedtime as needed for sleep or anxiety. 06/08/19   [provider]  amLODipine (NORVASC) 5 MG tablet TAKE 1 TABLET(5 MG) BY MOUTH DAILY 04/15/23   [place], [name], MD  Ascorbic Acid (VITAMIN C) 1000 MG tablet Take 1,000 mg by mouth every evening.    [provider]  atorvastatin (LIPITOR) 40 MG tablet Take 40 mg by mouth at bedtime. 01/29/15   [provider]  buPROPion (WELLBUTRIN XL) 150 MG 24 hr tablet Take 150 mg by mouth every morning. 03/08/23   [provider]  Calcium Carbonate-Vit D-Min (CALCIUM 1200 PO) Take 1 tablet by mouth in the morning.    [provider]  Cholecalciferol (VITAMIN D) 125 MCG (5000 UT) CAPS Take 5,000 Units by mouth every evening.    [provider]  Cyanocobalamin (VITAMIN B-12) 5000 MCG TBDP Take by mouth at bedtime.    [provider]  denosumab (PROLIA) 60 MG/ML SOSY injection Inject 60 mg into the skin every 6 (six) months.    [provider]  desvenlafaxine (PRISTIQ) 100 MG 24 hr tablet Take 100 mg by mouth in the morning.    [provider]  ezetimibe (ZETIA) 10  MG tablet Take 10 mg by mouth at bedtime.    [provider]  lamoTRIgine (LAMICTAL) 150 MG tablet Take 150 mg by mouth at bedtime.    [provider]  Multiple Vitamin (MULTIVITAMIN) tablet Take 1 tablet by mouth in the morning.    [provider]  olmesartan (BENICAR) 40 MG tablet Take 40 mg by mouth in the morning. 03/15/20   [provider]  Omega-3 Fatty Acids (FISH OIL PO) Take 1 capsule by mouth in the morning and at bedtime.    [provider]  phenazopyridine (PYRIDIUM) 200 MG tablet Take 1 tablet (200 mg total) by mouth  3 (three) times daily as needed (for pain with urination). 03/25/23 03/24/24  Rene Paci, MD  REPATHA SURECLICK 140 MG/ML SOAJ Inject 1 Syringe into the skin every 14 (fourteen) days. 07/11/20   [provider]  tretinoin (RETIN-A) 0.05 % cream Apply 1 Application topically daily as needed (skin blemishes.). Apply a pea sized amount to face    [provider]  zinc gluconate 50 MG tablet Take 50 mg by mouth at bedtime.    [provider]      Allergies    Oxycodone-acetaminophen, Versed [midazolam], and Hydrocodone-acetaminophen    Review of Systems   Review of Systems  Physical Exam Updated Vital Signs BP 134/64   Pulse 90   Temp 99 F (37.2 C) (Oral)   Resp 13   Wt 47.6 kg   SpO2 100%   BMI 19.52 kg/m  Physical Exam Vitals and nursing note reviewed.  Constitutional:      Appearance: She is well-developed. She is not diaphoretic.  HENT:     Head: Normocephalic and atraumatic.     Mouth/Throat:     Mouth: Mucous membranes are not dry.  Eyes:     Conjunctiva/sclera: Conjunctivae normal.  Neck:     Vascular: Normal carotid pulses. No JVD.     Trachea: Trachea normal. No tracheal deviation.  Cardiovascular:     Rate and Rhythm: Normal rate and regular rhythm.     Pulses: No decreased pulses.          Radial pulses are 2+ on the right side and 2+ on the left side.     Heart sounds: Normal heart sounds, S1 normal and S2 normal. No murmur heard. Pulmonary:     Effort: Pulmonary effort is normal. No respiratory distress.     Breath sounds: No wheezing.  Chest:     Chest wall: No tenderness.  Abdominal:     General: Bowel sounds are normal.     Palpations: Abdomen is soft.     Tenderness: There is no abdominal tenderness. There is no guarding or rebound.  Musculoskeletal:        General: Normal range of motion.     Cervical back: Normal range of motion and neck supple. No muscular tenderness.  Skin:    General: Skin is warm and dry.      Coloration: Skin is not pale.  Neurological:     Mental Status: She is alert.     ED Results / Procedures / Treatments   Labs (all labs ordered are listed, but only abnormal results are displayed) Labs Reviewed  BASIC METABOLIC PANEL - Abnormal; Notable for the following components:      Result Value   Glucose, Bld 101 (*)    BUN 28 (*)    All other components within normal limits  CBC  CBG MONITORING,  ED  TROPONIN I (HIGH SENSITIVITY)  TROPONIN I (HIGH SENSITIVITY)    EKG EKG Interpretation Date/Time:  Friday May 23 2023 07:31:46 EST Ventricular Rate:  77 PR Interval:  163 QRS Duration:  74 QT Interval:  352 QTC Calculation: 399 R Axis:   58  Text Interpretation: Sinus rhythm ST elevation suggests acute pericarditis Confirmed by Margarita Grizzle [phone]) on 05/23/2023 7:41:34 AM  Radiology DG Chest Portable 1 View  Result Date: 05/23/2023 CLINICAL DATA:  Chest pain. EXAM: PORTABLE CHEST 1 VIEW COMPARISON:  PA Lat 02/12/2023 FINDINGS: The cardiac size normal. The mediastinum is stable with aortic tortuosity and atherosclerosis. The lungs are slightly emphysematous but clear apart from mild biapical scarring changes. Partially visible lumbar fusion hardware again is seen. There are left breast calcifications. IMPRESSION: No evidence of acute chest disease. Emphysema. Aortic atherosclerosis. Electronically Signed   By: Almira Bar M.D.   On: 05/23/2023 07:40    Procedures Procedures    Medications Ordered in ED Medications  LORazepam (ATIVAN) tablet 0.5 mg (0.5 mg Oral Given 05/23/23 6045)    ED Course/ Medical Decision Making/ A&P    Patient seen and examined. History obtained directly from patient and husband at bedside.  I reviewed previous cardiology notes.  Labs/EKG: Ordered CBC, BMP, troponin, EKG.   Imaging: Ordered chest x-ray.  Medications/Fluids: None ordered  Most recent vital signs reviewed and are as follows: BP 134/64   Pulse 90    Temp 99 F (37.2 C) (Oral)   Resp 13   Wt 47.6 kg   SpO2 100%   BMI 19.52 kg/m   Initial impression: Atypical chest pain, anxiety.  Patient with history of nonobstructive coronary artery disease.  EKG reviewed earlier with Dr. Rosalia Hammers. Mild ST segment changes noted. No STEMI criteria, will monitor. Will need cards consult.   8:49 AM Reassessment performed. Patient appears stable.  Labs personally reviewed and interpreted including: CBC unremarkable; BMP glucose 101, BUN 28, otherwise unremarkable; first troponin 16.  Repeat EKG ordered.  Imaging personally visualized and interpreted including: Chest x-ray, agree negative.  Reviewed pertinent lab work and imaging with patient at bedside. Questions answered.   Most current vital signs reviewed and are as follows: BP 131/64   Pulse 81   Temp 99 F (37.2 C) (Oral)   Resp 16   Wt 47.6 kg   SpO2 98%   BMI 19.52 kg/m   Plan: Cardiology consult.  2:11 PM Reassessment performed. Patient appears stable.  Patient seen by cardiology, recommendations reviewed.  Patient cleared for discharge, plan for outpatient follow-up in 1 week.  Labs personally reviewed and interpreted including: Troponin 16>>14.  Reviewed pertinent lab work and imaging with patient at bedside. Questions answered.   Most current vital signs reviewed and are as follows: BP (!) 150/79 (BP Location: Right Arm)   Pulse 75   Temp 98 F (36.7 C) (Oral)   Resp 15   Wt 47.6 kg   SpO2 100%   BMI 19.52 kg/m   Plan: Discharge to home.   Prescriptions written for: None  Return and follow-up instructions: I encouraged patient to return to ED with severe chest pain, especially if the pain is crushing or pressure-like and spreads to the arms, back, neck, or jaw, or if they have associated sweating, vomiting, or shortness of breath with the pain, or significant pain with activity. We discussed that the evaluation here today indicates a low-risk of serious cause of chest  pain, including heart trouble or a blood  clot, but no evaluation is perfect and chest pain can evolve with time. The patient verbalized understanding and agreed.  I encouraged patient to follow-up with their provider in the next 48 hours for recheck.                                      Medical Decision Making Amount and/or Complexity of Data Reviewed Labs: ordered. Radiology: ordered.  Risk Prescription drug management.   For this patient's complaint of chest pain, the following emergent conditions were considered on the differential diagnosis: acute coronary syndrome, pulmonary embolism, pneumothorax, myocarditis, pericardial tamponade, aortic dissection, thoracic aortic aneurysm complication, esophageal perforation.   Other causes were also considered including: gastroesophageal reflux disease, musculoskeletal pain including costochondritis, pneumonia/pleurisy, herpes zoster, pericarditis.  In regards to possibility of ACS, patient has atypical features of pain, negative troponins x 2.   Cardiology recs obtain, plan for outpatient follow-up.  The patient's vital signs, pertinent lab work and imaging were reviewed and interpreted as discussed in the ED course. Hospitalization was considered for further testing, treatments, or serial exams/observation. However as patient is well-appearing, has a stable exam, and reassuring studies today, I do not feel that they warrant admission at this time. This plan was discussed with the patient who verbalizes agreement and comfort with this plan and seems reliable and able to return to the Emergency Department with worsening or changing symptoms.           Final Clinical Impression(s) / ED Diagnoses Final diagnoses:  Precordial pain    Rx / DC Orders ED Discharge Orders     None         Renne Crigler, PA-C 05/23/23 1413    Margarita Grizzle, MD 05/23/23 [phone]

## 2023-05-23 NOTE — Consult Note (Addendum)
Cardiology Consultation   Patient ID: Shannon Berger MRN: [id number]; DOB: 21-Nov-1940  Admit date: 05/23/2023 Date of Consult: 05/23/2023  PCP:  Shannon Ran, MD   Shannon Berger Providers Cardiologist:  Shannon Swaziland, MD      Patient Profile:   Shannon Berger is a 82 y.o. female with a hx of carotid artery disease s/p left CEA, HTN, HLD, ischemic optic neuropathy, osteopenia who is being seen 05/23/2023 for the evaluation of chest pain at the request of Shannon Berger  History of Present Illness:   Shannon Berger is a 82 year old female with above medical history who is followed by Dr. Swaziland.  Per chart review, patient was previously seen in 2016 for evaluation of chest pain.  Underwent nuclear stress test on 03/24/2015 that showed EF 68%, normal perfusion, overall low risk study.  She was later seen in 2019 with symptoms of dyspnea on exertion.  Underwent coronary CTA on 06/01/18 that showed a coronary calcium score of 260 (73rd percentile), nonobstructive CAD.  She was later admitted in 05/2018 with aphasia, left-sided weakness.  CT and MRI showed no acute findings, carotid Doppler showed progression of carotid artery disease with greater than 80% left carotid stenosis.  She underwent left CEA with Shannon Berger on 05/26/2018 without complications.  Patient again seen in 08/2021 with atypical chest pain.  Later in 12/2021, she had recurrent chest pain.  Underwent coronary CTA on 03/06/2022 that showed a coronary calcium score of 326 (72nd percentile), moderate plaque in the RCA, mild plaque in the LAD.  Study was sent for [facility] which was consistent with nonobstructive CAD.  Patient's carotid artery disease is followed by Shannon Berger with vascular surgery.  Most recent carotid Dopplers from 09/2022 showed 1-39% stenosis in bilateral ICAs.  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   This has been asymptomatic so has been managed conservatively.  Patient was last seen by Dr. Swaziland on 03/21/23. At that time, patient reported that she has been having low BP readings, sometimes into the 80s systolic. Her lasix was stopped as was her hydralazine. She was seeing Shannon Berger for BP medication adjustments.   Patient presented to the ED on 11/15 complaining of chest pain that began at 1800 on 11/14. Reported that pain went away, but returned later that night. Initial vital signs showed BP 151/67, HR 95 BPM, oxygen 99% on room air. Labs showed K 4.0, creatinine 0.87, WBC 10.3, hemoglobin 12.8, platelets 349. hsTn 16, 14. CXR showed no evidence of acute chest disease, emphysema.   On interview, patient reports that she has been having chest pain when anxious for several years, and this has been worked up multiple times in the past. Reports that yesterday, she was more anxious than usual. She had a lot of stressful events occur in a short period of time, and her stress seemed to build throughout the day. When very stressed, she developed chest pain that felt like an ache. Did not radiate down her arms or into her neck. Pain would go away if she were to busy herself and stop thinking about her stressors. Also improved with xanax. Pain would worsen when her stress levels increased. She also developed back pain yesterday that resolved when her husband massaged her back. She denies recent exertional chest pain or shortness of breath. She reports weakness in her legs recently, and sometimes her legs seem to "give out".  Denies claudication, syncope, near syncope. Working with PT due to her leg weakness   Currently, she is chest pain free and resting comfortably in the ED.   Past Medical History:  Diagnosis Date   Anginal pain (HCC)    occasionally   Anxiety    Arthritis    Bladder cancer (HCC)    Carotid arterial disease (HCC)    Chronic kidney disease    Complication of anesthesia     "hallucinates for days; have a hard time waking up; almost couldn't get her awake last time; wake up wild" (05/26/2018)   Constipation    Degeneration of lumbar intervertebral disc    Dislocation of hip joint prosthesis (HCC)    Essential tremor    Fusion of spine of lumbar region    Heart murmur    "slight one" (05/26/2018)   Hyperlipidemia    Hypertension    Low back pain radiating to left lower extremity    Melanoma (HCC)    "center of upper chest"   Osteoarthritis    Osteopenia    Sleep difficulties    Spinal stenosis at L4-L5 level    Stroke [facility])    tia--last week   TIA (transient ischemic attack) 05/20/2018    Past Surgical History:  Procedure Laterality Date   ANTERIOR LUMBAR FUSION     added rods and screws;   BACK SURGERY     BLADDER SURGERY     BREAST BIOPSY Left    "not cancer"   BUNIONECTOMY Left    CAROTID ENDARTERECTOMY Left 05/26/2018   CATARACT EXTRACTION W/ INTRAOCULAR LENS  IMPLANT, BILATERAL Bilateral    CYSTOSCOPY N/A 03/25/2023   Procedure: CYSTOSCOPY;  Surgeon: Rene Paci, MD;  Location: [facility];  Service: Urology;  Laterality: N/A;   ENDARTERECTOMY Left 05/26/2018   Procedure: ENDARTERECTOMY CAROTID LEFT;  Surgeon: Chuck Hint, MD;  Location: [facility] [place] OR;  Service: Vascular;  Laterality: Left;   EYE SURGERY Right    "retina wrinkle OR; now can't see out of that eye" (05/26/2018)   JOINT REPLACEMENT Left 2014   Hip   LUMBAR SPINE HARDWARE REMOVAL     "went in thru the front"   MELANOMA EXCISION     "center of upper chest"   PATCH ANGIOPLASTY Left 05/26/2018   Procedure: PATCH ANGIOPLASTY LEFT CAROTID ARTERY;  Surgeon: Chuck Hint, MD;  Location: [facility] OR;  Service: Vascular;  Laterality: Left;   PILONIDAL CYST DRAINAGE     POSTERIOR LUMBAR FUSION  X 2   TENDON REPAIR Left    Reattachment of tendon- Left hip   THUMB FUSION Right    TONSILLECTOMY AND ADENOIDECTOMY     TOTAL HIP ARTHROPLASTY Left 09/04/2012    Procedure: LEFT TOTAL HIP ARTHROPLASTY ANTERIOR APPROACH;  Surgeon: Kathryne Hitch, MD;  Location: [facility];  Service: Orthopedics;  Laterality: Left;  Left Total Hip Arthroplasty, Anterior Approach   TRANSURETHRAL RESECTION OF BLADDER TUMOR N/A 06/06/2020   Procedure: TRANSURETHRAL RESECTION OF BLADDER TUMOR(TURBT) WITH CYSTOSCOPY/ INSTILLATION OF GEMCITABINE WITH RIGHT STENT PLACEMENT;  Surgeon: Rene Paci, MD;  Location: [facility];  Service: Urology;  Laterality: N/A;   TRANSURETHRAL RESECTION OF BLADDER TUMOR N/A 03/25/2023   Procedure: TRANSURETHRAL RESECTION OF BLADDER TUMOR (TURBT);  Surgeon: Rene Paci, MD;  Location: [facility];  Service: Urology;  Laterality: N/A;  30 MINUTES     Home Medications:  Prior to Admission medications   Medication Sig Start Date End Date Taking? Authorizing Provider  acetaminophen (TYLENOL) 500 MG tablet Take 500 mg by mouth every 6 (six) hours as needed for moderate pain or headache.   Yes [provider]  ALPRAZolam Prudy Feeler) 0.5 MG tablet Take 0.25-0.5 mg by mouth at bedtime as needed for sleep or anxiety. 06/08/19  Yes [provider]  amLODipine (NORVASC) 5 MG tablet TAKE 1 TABLET(5 MG) BY MOUTH DAILY 04/15/23  Yes Berger, Shannon M, MD  Ascorbic Acid (VITAMIN C) 1000 MG tablet Take 1,000 mg by mouth every evening.   Yes [provider]  aspirin EC 325 MG tablet Take 325 mg by mouth every 6 (six) hours as needed for moderate pain (pain score 4-6).   Yes [provider]  atorvastatin (LIPITOR) 40 MG tablet Take 40 mg by mouth at bedtime. 01/29/15  Yes [provider]  Calcium Carbonate-Vit Berger-Min (CALCIUM 1200 PO) Take 1 tablet by mouth in the morning.   Yes [provider]  Cholecalciferol (VITAMIN Berger) 125 MCG (5000 UT) CAPS Take 5,000 Units by mouth every evening.   Yes [provider]  Cyanocobalamin (VITAMIN B-12) 5000 MCG TBDP Take by mouth at bedtime.   Yes [provider]  denosumab (PROLIA) 60 MG/ML SOSY injection Inject 60 mg into the skin every 6 (six) months.   Yes [provider]  desvenlafaxine (PRISTIQ) 100 MG 24 hr tablet Take 100 mg by mouth in the morning.   Yes [provider]  ezetimibe (ZETIA) 10 MG tablet Take 10 mg by mouth at bedtime.   Yes [provider]  lamoTRIgine (LAMICTAL) 200 MG tablet Take 200 mg by mouth daily.   Yes [provider]  Multiple Vitamin (MULTIVITAMIN) tablet Take 1 tablet by mouth in the morning.   Yes [provider]  olmesartan (BENICAR) 40 MG tablet Take 40 mg by mouth in the morning. 03/15/20  Yes [provider]  Omega-3 Fatty Acids (FISH OIL PO) Take 1 capsule by mouth in the morning and at bedtime.   Yes [provider]  REPATHA SURECLICK 140 MG/ML SOAJ Inject 1 Syringe into the skin every 14 (fourteen) days. 07/11/20  Yes [provider]  tretinoin (RETIN-A) 0.05 % cream Apply 1 Application topically daily as needed (skin blemishes.). Apply a pea sized amount to face   Yes [provider]  zinc gluconate 50 MG tablet Take 50 mg by mouth at bedtime.   Yes [provider]  buPROPion (WELLBUTRIN XL) 150 MG 24 hr tablet Take 150 mg by mouth every morning. Patient not taking: Reported on 05/23/2023 03/08/23   [provider]    Inpatient Medications: Scheduled Meds:  Continuous Infusions:  PRN Meds:   Allergies:    Allergies  Allergen Reactions   Oxycodone-Acetaminophen     hallucinations   Versed [Midazolam] Other (See Comments)    Had trouble waking up    Hydrocodone-Acetaminophen     hallucination     Social History:   Social History   Socioeconomic History   Marital status: Married    Spouse name: jerry   Number of children: 2   Years of education: Not on file   Highest education level: Bachelor's degree (e.g., BA, AB, BS)  Occupational History   Occupation: Photographer: RETIRED    Comment: retired  Tobacco Use   Smoking status: Never   Smokeless tobacco: Never  Vaping Use   Vaping status: Never Used  Substance and Sexual Activity   Alcohol use: Yes  Comment: 1 a month   Drug use: Never   Sexual activity: Not Currently    Partners: Male    Birth control/protection: Post-menopausal    Comment: 1st intercourse- 23, partners- 1  Other Topics Concern   Not on file  Social History Narrative   Pt lives at home with spouse jerry, 2 story home has 2 children   She is right handed, she drinks ginger ale and come tea, no coffee, or soda   Social Determinants of Health   Financial Resource Strain: Not on file  Food Insecurity: Not on file  Transportation Needs: No Transportation Needs (02/20/2023)   PRAPARE - Administrator, Civil Service (Medical): No    Lack of Transportation (Non-Medical): No  Physical Activity: Not on file  Stress: Not on file  Social Connections: Not on file  Intimate Partner Violence: Not on file    Family History:   Family History  Problem Relation Age of Onset   Heart failure Mother    Cancer Mother        colon   Hypertension Mother    Other Mother        Varicose Veins   Diabetes Father    Heart disease Father    Hypertension Father    Heart attack Father    Cancer Brother        squamos cell - head, neck, ear   Healthy Son    Migraines Daughter      ROS:  Please see the history of present illness.  All other ROS reviewed and negative.     Physical Exam/Data:   Vitals:   05/23/23 0930 05/23/23 1000 05/23/23 1019 05/23/23 1045  BP: 137/72 128/66  125/69  Pulse: 79 81  80  Resp: 19 16  15   Temp:   98.8 F (37.1 C)   TempSrc:   Oral   SpO2: 98% 100%  100%  Weight:       No intake or output data in the 24 hours ending 05/23/23 1103    05/23/2023    6:45 AM 03/25/2023    8:41 AM 03/21/2023    1:38 PM  Last 3 Weights  Weight (lbs) 105 lb 106 lb 9.6 oz 106 lb 9.6 oz  Weight  (kg) 47.628 kg 48.353 kg 48.353 kg     Body mass index is 19.52 kg/m.  General:  Thin, elderly female. Sitting upright in the bed in no acute distress  HEENT: normal Neck: no JVD Vascular: Radial pulses 2+ bilaterally Cardiac:  normal S1, S2; RRR; no murmur   Lungs:  clear to auscultation bilaterally, no wheezing, rhonchi or rales. Normal work of breathing on room air   Abd: soft, nontender   Ext: no edema in BLE Musculoskeletal:  No deformities, BUE and BLE strength normal and equal Skin: warm and dry  Neuro:  CNs 2-12 intact, no focal abnormalities noted Psych:  Normal affect   Telemetry:  Telemetry was personally reviewed and demonstrates:  NSR   Relevant CV Studies: Cardiac Studies & Procedures     STRESS TESTS  MYOCARDIAL PERFUSION IMAGING 03/24/2015  Narrative  The left ventricular ejection fraction is hyperdynamic (>65%).  Nuclear stress EF: 68%.  There was no ST segment deviation noted during stress.  The study is normal.  This is a low risk study.  Normal myocardial perfusion and function  Low risk lexiscan nuclear stress test demonstrating normal myocardial perfusion and function; EF 68%,   ECHOCARDIOGRAM  ECHOCARDIOGRAM COMPLETE  05/21/2018  Narrative *[place]* *[facility]* [address] [place], [place] [address] [phone]  ------------------------------------------------------------------- Transthoracic Echocardiography  Patient:    Berneda, Vocke MR #:       [id number] Study Date: 05/21/2018 Gender:     F Age:        75 Height:     153.7 cm Weight:     54.7 kg BSA:        1.54 m^2 Pt. Status: Room:       3W07C  SONOGRAPHER  Lavenia Atlas, RCS ADMITTING    Lorretta Harp [id number]     QVZ, DGLOV [id number] Rene Paci [id number] PERFORMING   [facility], Inpatient ATTENDING    Stephania Fragmin Chirag  cc:  ------------------------------------------------------------------- LV EF: 60% -    65%  ------------------------------------------------------------------- Indications:      TIA 435.9.  ------------------------------------------------------------------- History:   Risk factors:  Hypertension. Dyslipidemia.  ------------------------------------------------------------------- Study Conclusions  - Left ventricle: The cavity size was normal. Wall thickness was normal. Systolic function was normal. The estimated ejection fraction was in the range of 60% to 65%. Wall motion was normal; there were no regional wall motion abnormalities. Features are consistent with a pseudonormal left ventricular filling pattern, with concomitant abnormal relaxation and increased filling pressure (grade 2 diastolic dysfunction). - Mitral valve: There was mild regurgitation. - Pulmonary arteries: Systolic pressure was mildly increased. PA peak pressure: 34 mm Hg (S).  ------------------------------------------------------------------- Study data:  Comparison was made to the study of 04/13/2018.  Study status:  Routine.  Procedure:  The patient reported no pain pre or post test. Transthoracic echocardiography. Image quality was adequate.  Study completion:  There were no complications. Transthoracic echocardiography.  M-mode, complete 2D, spectral Doppler, and color Doppler.  Birthdate:  Patient birthdate: [birth year].  Age:  Patient is 82 yr old.  Sex:  Gender: female. BMI: 23.2 kg/m^2.  Blood pressure:     143/66  Patient status: Inpatient.  Study date:  Study date: 05/21/2018. Study time: 08:50 AM.  Location:  Echo laboratory.  -------------------------------------------------------------------  ------------------------------------------------------------------- Left ventricle:  The cavity size was normal. Wall thickness was normal. Systolic function was normal. The estimated ejection fraction was in the range of 60% to 65%. Wall motion was normal; there were no regional wall motion  abnormalities. Features are consistent with a pseudonormal left ventricular filling pattern, with concomitant abnormal relaxation and increased filling pressure (grade 2 diastolic dysfunction).  ------------------------------------------------------------------- Aortic valve:   Structurally normal valve.   Cusp separation was normal.  Doppler:  Transvalvular velocity was within the normal range. There was no stenosis. There was no regurgitation.  ------------------------------------------------------------------- Aorta:  Aortic root: The aortic root was normal in size. Ascending aorta: The ascending aorta was normal in size.  ------------------------------------------------------------------- Mitral valve:   Structurally normal valve.   Leaflet separation was normal.  Doppler:  Transvalvular velocity was within the normal range. There was no evidence for stenosis. There was mild regurgitation.    Valve area by pressure half-time: 3.19 cm^2. Indexed valve area by pressure half-time: 2.08 cm^2/m^2.    Peak gradient (Berger): 4 mm Hg.  ------------------------------------------------------------------- Left atrium:  The atrium was normal in size.  ------------------------------------------------------------------- Right ventricle:  The cavity size was normal. Systolic function was normal.  ------------------------------------------------------------------- Pulmonic valve:    The valve appears to be grossly normal. Doppler:  There was no significant regurgitation.  ------------------------------------------------------------------- Tricuspid valve:   The valve appears to be grossly normal. Doppler:  There  was mild regurgitation.  ------------------------------------------------------------------- Pulmonary artery:   Systolic pressure was mildly increased.  ------------------------------------------------------------------- Right atrium:  The atrium was normal in  size.  ------------------------------------------------------------------- Pericardium:  There was no pericardial effusion.  ------------------------------------------------------------------- Measurements  Left ventricle                           Value          Reference LV ID, ED, PLAX chordal          (L)     40    mm       43 - 52 LV ID, ES, PLAX chordal                  26    mm       23 - 38 LV fx shortening, PLAX chordal           35    %        >=29 LV PW thickness, ED                      8     mm       ---------- IVS/LV PW ratio, ED                      1.13           <=1.3 Stroke volume, 2D                        52    ml       ---------- Stroke volume/bsa, 2D                    34    ml/m^2   ---------- LV ejection fraction, 1-p A4C            59    %        ---------- LV e&', lateral                           7.07  cm/s     ---------- LV E/e&', lateral                         13.47          ---------- LV e&', medial                            7.51  cm/s     ---------- LV E/e&', medial                          12.68          ---------- LV e&', average                           7.29  cm/s     ---------- LV E/e&', average                         13.06          ----------  Ventricular septum                       Value  Reference IVS thickness, ED                        9     mm       ----------  LVOT                                     Value          Reference LVOT ID, S                               19    mm       ---------- LVOT area                                2.84  cm^2     ---------- LVOT peak velocity, S                    85.8  cm/s     ---------- LVOT mean velocity, S                    56.8  cm/s     ---------- LVOT VTI, S                              18.3  cm       ---------- LVOT peak gradient, S                    3     mm Hg    ----------  Aortic valve                             Value          Reference Aortic regurg pressure half-time          536   ms       ----------  Aorta                                    Value          Reference Aortic root ID, ED                       27    mm       ----------  Left atrium                              Value          Reference LA ID, A-P, ES                           36    mm       ---------- LA ID/bsa, A-P                   (H)     2.34  cm/m^2   <=2.2 LA volume, S  28.2  ml       ---------- LA volume/bsa, S                         18.4  ml/m^2   ---------- LA volume, ES, 1-p A4C                   27.8  ml       ---------- LA volume/bsa, ES, 1-p A4C               18.1  ml/m^2   ---------- LA volume, ES, 1-p A2C                   28.9  ml       ---------- LA volume/bsa, ES, 1-p A2C               18.8  ml/m^2   ----------  Mitral valve                             Value          Reference Mitral E-wave peak velocity              95.2  cm/s     ---------- Mitral A-wave peak velocity              62.9  cm/s     ---------- Mitral deceleration time         (H)     236   ms       150 - 230 Mitral pressure half-time                69    ms       ---------- Mitral peak gradient, Berger                  4     mm Hg    ---------- Mitral E/A ratio, peak                   1.5            ---------- Mitral valve area, PHT, DP               3.19  cm^2     ---------- Mitral valve area/bsa, PHT, DP           2.08  cm^2/m^2 ----------  Pulmonary arteries                       Value          Reference PA pressure, S, DP               (H)     34    mm Hg    <=30  Tricuspid valve                          Value          Reference Tricuspid regurg peak velocity           279   cm/s     ---------- Tricuspid peak RV-RA gradient            31    mm Hg    ----------  Right atrium  Value          Reference RA ID, S-I, ES, A4C                      42.1  mm       34 - 49 RA area, ES, A4C                         10.4  cm^2     8.3 - 19.5 RA volume, ES, A/L                        20.9  ml       ---------- RA volume/bsa, ES, A/L                   13.6  ml/m^2   ----------  Systemic veins                           Value          Reference Estimated CVP                            3     mm Hg    ----------  Right ventricle                          Value          Reference RV ID, minor axis, ED, A4C base          18    mm       ---------- TAPSE                                    29.4  mm       ---------- RV pressure, S, DP               (H)     34    mm Hg    <=30 RV s&', lateral, S                        9.9   cm/s     ----------  Legend: (L)  and  (H)  mark values outside specified reference range.  ------------------------------------------------------------------- Prepared and Electronically Authenticated by  Kristeen Miss, M.Berger. 2019-11-14T13:02:59     CT SCANS  CT CORONARY MORPH W/CTA COR W/SCORE 02/07/2022  Addendum 02/07/2022  2:57 PM ADDENDUM REPORT: 02/07/2022 14:55  EXAM: OVER-READ INTERPRETATION  CT CHEST  The following report is a limited chest CT over-read performed by radiologist Dr. Irma Newness [facility] on 02/07/2022. This over-read does not include interpretation of cardiac or coronary anatomy or pathology. The coronary CTA interpretation by the cardiologist is attached.  COMPARISON:  Chest CTA 02/06/2021  FINDINGS: Mediastinum/Nodes: No enlarged lymph nodes within the visualized mediastinum.  Lungs/Pleura: There is no pleural effusion. Stable mild bronchiectasis and scarring, primarily within the right middle lobe and lingula. No airspace disease or suspicious pulmonary nodule.  Upper abdomen: The visualized upper abdomen appears stable without significant findings. Probable cyst in the dome of the right hepatic lobe appears unchanged.  Musculoskeletal/Chest wall: No chest wall mass or suspicious osseous findings within the visualized chest. Stable chronic compression  deformities at T11 and  T12.  IMPRESSION: No significant extracardiac findings within the visualized chest.   Electronically Signed By: Carey Bullocks M.Berger. On: 02/07/2022 14:55  Narrative CLINICAL DATA:  26F with hypertension, hyperlipidemia, carotid stenosis, and dyspnea.  EXAM: Cardiac/Coronary  CT  TECHNIQUE: The patient was scanned on a [employer].  FINDINGS: A 120 kV prospective scan was triggered in the descending thoracic aorta at 111 HU's. Axial non-contrast 3 mm slices were carried out through the heart. The data set was analyzed on a dedicated work station and scored using the Agatson method. Gantry rotation speed was 250 msecs and collimation was .6 mm. No beta blockade and 0.8 mg of sl NTG was given. The 3D data set was reconstructed in 5% intervals of the 67-82 % of the R-R cycle. Diastolic phases were analyzed on a dedicated work station using MPR, MIP and VRT modes. The patient received 80 cc of contrast.  Aorta: Normal size.  No calcifications.  No dissection.  Aortic Valve:  Trileaflet.  No calcifications.  Coronary Arteries:  Normal coronary origin.  Right dominance.  RCA is a large dominant artery that gives rise to PDA and PLVB. There is moderate(50-69%) calcified plaque in the proximal RCA and minimal (<25%) calcified plaque in the mid vessel.  Left main is a large artery that gives rise to LAD and LCX arteries.  LAD is a large vessel that has mild (25-49%) calcified plaque proximally and minimal (<25%) soft plaque distally. D1 is a small vessel without plaque.  LCX is a non-dominant artery.  There is no plaque.  Coronary Calcium Score:  Left main: 0  Left anterior descending artery: 223  Left circumflex artery: 0  Right coronary artery: 104  Total: 326  Percentile: 72nd  Other findings:  Normal pulmonary vein drainage into the left atrium.  Normal let atrial appendage without a thrombus.  Normal size of the pulmonary artery.  Mild  mitral annular calcification.  IMPRESSION: 1. Coronary calcium score of 326. This was 72nd percentile for age-, race-, and sex-matched controls.  2. Normal coronary origin with right dominance.  3. Moderate (50-69%) plaque in the RCA and mild (25-49%) plaque in the LAD. CAD-RADS 3.  4.  Will send study for FFRct.  Interpretation of the non-cardiac intrathoracic structures by Radiology is pending.  Chilton Si, MD  Electronically Signed: By: Chilton Si M.Berger. On: 02/07/2022 13:17   CT SCANS  CT CORONARY MORPH W/CTA COR W/SCORE 06/01/2018  Addendum 06/01/2018 10:23 PM ADDENDUM REPORT: 06/01/2018 22:20  CLINICAL DATA:  Chest pain  EXAM: Cardiac CTA  MEDICATIONS: Sub lingual nitro. 4mg  x 2  TECHNIQUE: The patient was scanned on a Siemens 192 slice scanner. Gantry rotation speed was 250 msecs. Collimation was 0.6 mm. A 100 kV prospective scan was triggered in the ascending thoracic aorta at 35-75% of the R-R interval. Average HR during the scan was 60 bpm. The 3D data set was interpreted on a dedicated work station using MPR, MIP and VRT modes. A total of 80cc of contrast was used.  FINDINGS: Non-cardiac: See separate report from [facility].  The pulmonary veins drained normally to the left atrium.  Calcium Score: 260 Agatston units.  Coronary Arteries: Right dominant with no anomalies  LM: No plaque or stenosis.  LAD system: Calcified plaque proximal LAD with mild (<50%) stenosis.  Circumflex system: Moderate ramus with no significant disease. The AV LCx was relatively small with no plaque or stenosis.  RCA system: Calcified  plaque with mild stenosis (<50%) in the proximal RCA.  IMPRESSION: 1. Coronary artery calcium score 260 Agatston units. This places the patient in the 73rd percentile for age and gender, suggesting intermediate risk for future cardiac events.  2.  Nonobstructive CAD.  Dalton Mclean   Electronically  Signed By: Marca Ancona M.Berger. On: 06/01/2018 22:20  Narrative EXAM: OVER-READ INTERPRETATION  CT CHEST  The following report is an over-read performed by radiologist Dr. Charlett Nose of [facility] on 06/01/2018. This over-read does not include interpretation of cardiac or coronary anatomy or pathology. The coronary CTA interpretation by the cardiologist is attached.  COMPARISON:  Chest CT 12/26/2015  FINDINGS: Vascular: Tortuous aorta.  No aneurysm.  Heart is normal size.  Mediastinum/Nodes: No adenopathy in the lower mediastinum or hila.  Lungs/Pleura: No confluent opacities or effusions.  Upper Abdomen: Small low-density lesion in the right hepatic lobe is stable most compatible with cyst. No acute findings  Musculoskeletal: Chest wall soft tissues are unremarkable. No acute bony abnormality.  IMPRESSION: No acute extra cardiac abnormality.  Tortuous aorta.  Electronically Signed: By: Charlett Nose M.Berger. On: 06/01/2018 11:45           Laboratory Data:  High Sensitivity Troponin:   Recent Labs  Lab 05/23/23 0733  TROPONINIHS 16     Chemistry Recent Labs  Lab 05/23/23 0733  NA 140  K 4.0  CL 107  CO2 24  GLUCOSE 101*  BUN 28*  CREATININE 0.87  CALCIUM 9.6  GFRNONAA >60  ANIONGAP 9    No results for input(s): "PROT", "ALBUMIN", "AST", "ALT", "ALKPHOS", "BILITOT" in the last 168 hours. Lipids No results for input(s): "CHOL", "TRIG", "HDL", "LABVLDL", "LDLCALC", "CHOLHDL" in the last 168 hours.  Hematology Recent Labs  Lab 05/23/23 0733  WBC 10.3  RBC 3.91  HGB 12.8  HCT 38.4  MCV 98.2  MCH 32.7  MCHC 33.3  RDW 12.6  PLT 349   Thyroid No results for input(s): "TSH", "FREET4" in the last 168 hours.  BNPNo results for input(s): "BNP", "PROBNP" in the last 168 hours.  DDimer No results for input(s): "DDIMER" in the last 168 hours.   Radiology/Studies:  DG Chest Portable 1 View  Result Date: 05/23/2023 CLINICAL DATA:  Chest  pain. EXAM: PORTABLE CHEST 1 VIEW COMPARISON:  PA Lat 02/12/2023 FINDINGS: The cardiac size normal. The mediastinum is stable with aortic tortuosity and atherosclerosis. The lungs are slightly emphysematous but clear apart from mild biapical scarring changes. Partially visible lumbar fusion hardware again is seen. There are left breast calcifications. IMPRESSION: No evidence of acute chest disease. Emphysema. Aortic atherosclerosis. Electronically Signed   By: Almira Bar M.Berger.   On: 05/23/2023 07:40     Assessment and Plan:   Chest Pain  - Patient reports having chest pain that worsens with anxiety, improves with Xanax or when she relaxes. She has been having this chest pain with anxiety for several years. Reports that yesterday the pain was worse than usual and came back overnight, so she came to the ED to be seen  - hsTn negative x2. EKG nonischemic  - Patient denies recent chest pain or shortness of breath on exertion. She does not have chest pain when she is not anxious/stressed - Symptoms have been present for several years- underwent coronary CTA 8/023 that showed nonobstructive CAD  - Chest pain is atypical, likely a symptom of her anxiety. No indication for inpatient workup at this time. Consider stress PET as an  outpatient   CAD on CT scan  - Coronary CTA from 02/2022 showed coronary calcium score of 326 (72nd percentile), moderate plaque in the RCA and mild plaque in the LAD. FFR consistent with non-obstructive CAD  - Continue repatha, zetia, lipitor, aspirin   Carotid Artery Disease  - S/p left CEA in 2019 - Followed by vascular surgery - Dopplers from 09/2022 showed 1-39% stenosis bilaterally  - Followed by vascular surgery   HLD   - Continue repatha, zetia   Mesenteric Stenosis  - Followed by vascular surgery   Risk Assessment/Risk Scores:     For questions or updates, please contact [place] [facility] Please consult [email or web address] for contact info under     Signed, Jonita Albee, PA-C  05/23/2023 11:03 AM   ATTENDING ATTESTATION  I have seen, examined and evaluated the patient along with Robet Leu, PA.  After reviewing all the available data and chart, we discussed the patients laboratory, study & physical findings as well as symptoms in detail.  I agree with her findings, examination as well as impression recommendations as per our discussion.   \ Patient with Coronary CTA evidence of moderate CAD but nonischemic on both CT FFR and previous nuclear stress test presenting with chest pain that is mostly associate with stress and anxiety.  Not associated with exertion.  She had a significant episode over the course of the evening last night without significant relief from nitroglycerin.  Has ruled out for non-STEMI with negative troponins.  Feels well and would like to go home.  Reasonable for her to be discharged home.  We discussed close follow-up with her PCP to discuss possibly using SSRI or SNRI for stress/anxiety.  Will also arrange outpatient follow-up with Robet Leu, PA.  If she still having symptoms, would probably consider stress PET to evaluate for microvascular ischemia.  Discussed with [facility].  Will discharge from the ER.    Marykay Lex, MD, MS Bryan Lemma, M.Berger., M.S. Interventional Cardiologist  [facility]  Pager # [phone] Phone # [phone] [address] [place], [place] [address]

## 2023-05-23 NOTE — ED Triage Notes (Signed)
Pt. Presents to the ED BIBA from home for c/o CP starting at 1800 on 11/14. Pt. Endorses pain went away and came back later on in the night. Denies cardiac hx but endorses hx of anxiety. Pt. Endorses al CP as resided upon arrival.

## 2023-05-26 DIAGNOSIS — I129 Hypertensive chronic kidney disease with stage 1 through stage 4 chronic kidney disease, or unspecified chronic kidney disease: Secondary | ICD-10-CM | POA: Diagnosis not present

## 2023-05-27 DIAGNOSIS — C671 Malignant neoplasm of dome of bladder: Secondary | ICD-10-CM | POA: Diagnosis not present

## 2023-05-27 DIAGNOSIS — M25559 Pain in unspecified hip: Secondary | ICD-10-CM | POA: Diagnosis not present

## 2023-05-27 DIAGNOSIS — Z5111 Encounter for antineoplastic chemotherapy: Secondary | ICD-10-CM | POA: Diagnosis not present

## 2023-05-27 DIAGNOSIS — M419 Scoliosis, unspecified: Secondary | ICD-10-CM | POA: Diagnosis not present

## 2023-05-27 DIAGNOSIS — R531 Weakness: Secondary | ICD-10-CM | POA: Diagnosis not present

## 2023-05-27 DIAGNOSIS — M546 Pain in thoracic spine: Secondary | ICD-10-CM | POA: Diagnosis not present

## 2023-05-29 DIAGNOSIS — K080 Exfoliation of teeth due to systemic causes: Secondary | ICD-10-CM | POA: Diagnosis not present

## 2023-05-30 ENCOUNTER — Encounter: Payer: Self-pay | Admitting: Cardiology

## 2023-05-30 ENCOUNTER — Ambulatory Visit: Payer: Medicare Other | Attending: Cardiology | Admitting: Cardiology

## 2023-05-30 VITALS — BP 102/62 | HR 75 | Ht 60.0 in | Wt 104.4 lb

## 2023-05-30 DIAGNOSIS — I6523 Occlusion and stenosis of bilateral carotid arteries: Secondary | ICD-10-CM

## 2023-05-30 DIAGNOSIS — R072 Precordial pain: Secondary | ICD-10-CM

## 2023-05-30 DIAGNOSIS — E785 Hyperlipidemia, unspecified: Secondary | ICD-10-CM

## 2023-05-30 DIAGNOSIS — I2510 Atherosclerotic heart disease of native coronary artery without angina pectoris: Secondary | ICD-10-CM

## 2023-05-30 DIAGNOSIS — I10 Essential (primary) hypertension: Secondary | ICD-10-CM

## 2023-05-30 DIAGNOSIS — M545 Low back pain, unspecified: Secondary | ICD-10-CM | POA: Diagnosis not present

## 2023-05-30 MED ORDER — ASPIRIN 81 MG PO TBEC: 81.0000 mg | DELAYED_RELEASE_TABLET | Freq: Every day | ORAL | Status: DC

## 2023-05-30 NOTE — Patient Instructions (Addendum)
Medication Instructions:  Decrease Aspirin 81 mg once a day *If you need a refill on your cardiac medications before your next appointment, please call your pharmacy*  Lab Work: No labs  Testing/Procedures: No testing  Follow-Up: At [facility], you and your health needs are our priority.  As part of our continuing mission to provide you with exceptional heart care, we have created designated Provider Care Teams.  These Care Teams include your primary Cardiologist (physician) and Advanced Practice Providers (APPs -  Physician Assistants and Nurse Practitioners) who all work together to provide you with the care you need, when you need it.  We recommend signing up for the patient portal called "MyChart".  Sign up information is provided on this After Visit Summary.  MyChart is used to connect with patients for Virtual Visits (Telemedicine).  Patients are able to view lab/test results, encounter notes, upcoming appointments, etc.  Non-urgent messages can be sent to your provider as well.   To learn more about what you can do with MyChart, go to [email or web address].    Your next appointment:   March 17 at 1:20 pm with Peter [place], MD

## 2023-06-03 DIAGNOSIS — R531 Weakness: Secondary | ICD-10-CM | POA: Diagnosis not present

## 2023-06-03 DIAGNOSIS — M546 Pain in thoracic spine: Secondary | ICD-10-CM | POA: Diagnosis not present

## 2023-06-03 DIAGNOSIS — M419 Scoliosis, unspecified: Secondary | ICD-10-CM | POA: Diagnosis not present

## 2023-06-03 DIAGNOSIS — M25559 Pain in unspecified hip: Secondary | ICD-10-CM | POA: Diagnosis not present

## 2023-06-09 ENCOUNTER — Telehealth: Payer: Self-pay | Admitting: Neurology

## 2023-06-09 NOTE — Telephone Encounter (Signed)
Patient Requesting Provider Switch request to Dr. Marjory Lies previously seen by Dr. Vickey Huger only for sleep in 2021 now being referred by PCP Dr. Waynard Edwards for memory loss and elevated [id number] level. Patient stated PCP recommended she see Dr. Marjory Lies for this. Please advise if this is acceptable, thank you

## 2023-06-10 DIAGNOSIS — H40012 Open angle with borderline findings, low risk, left eye: Secondary | ICD-10-CM | POA: Diagnosis not present

## 2023-06-12 DIAGNOSIS — M546 Pain in thoracic spine: Secondary | ICD-10-CM | POA: Diagnosis not present

## 2023-06-12 DIAGNOSIS — M25559 Pain in unspecified hip: Secondary | ICD-10-CM | POA: Diagnosis not present

## 2023-06-12 DIAGNOSIS — R531 Weakness: Secondary | ICD-10-CM | POA: Diagnosis not present

## 2023-06-12 DIAGNOSIS — R413 Other amnesia: Secondary | ICD-10-CM | POA: Diagnosis not present

## 2023-06-12 DIAGNOSIS — M419 Scoliosis, unspecified: Secondary | ICD-10-CM | POA: Diagnosis not present

## 2023-06-16 ENCOUNTER — Telehealth: Payer: Self-pay

## 2023-06-16 DIAGNOSIS — R531 Weakness: Secondary | ICD-10-CM | POA: Diagnosis not present

## 2023-06-16 DIAGNOSIS — M546 Pain in thoracic spine: Secondary | ICD-10-CM | POA: Diagnosis not present

## 2023-06-16 DIAGNOSIS — M25559 Pain in unspecified hip: Secondary | ICD-10-CM | POA: Diagnosis not present

## 2023-06-16 DIAGNOSIS — M4856XA Collapsed vertebra, not elsewhere classified, lumbar region, initial encounter for fracture: Secondary | ICD-10-CM | POA: Diagnosis not present

## 2023-06-16 DIAGNOSIS — M419 Scoliosis, unspecified: Secondary | ICD-10-CM | POA: Diagnosis not present

## 2023-06-16 NOTE — Telephone Encounter (Signed)
 Transition Care Management Unsuccessful Follow-up Telephone Call  Date of discharge and from where:  [facility] 11/15  Attempts:  1st Attempt  Reason for unsuccessful TCM follow-up call:  No answer/busy   Lenard Forth Spencer  [facility], [facility] Guide, Phone: [phone] Website: [email or web address]

## 2023-06-19 DIAGNOSIS — M546 Pain in thoracic spine: Secondary | ICD-10-CM | POA: Diagnosis not present

## 2023-06-19 DIAGNOSIS — R531 Weakness: Secondary | ICD-10-CM | POA: Diagnosis not present

## 2023-06-19 DIAGNOSIS — M25559 Pain in unspecified hip: Secondary | ICD-10-CM | POA: Diagnosis not present

## 2023-06-19 DIAGNOSIS — M419 Scoliosis, unspecified: Secondary | ICD-10-CM | POA: Diagnosis not present

## 2023-06-20 ENCOUNTER — Emergency Department (HOSPITAL_BASED_OUTPATIENT_CLINIC_OR_DEPARTMENT_OTHER): Payer: Medicare Other

## 2023-06-20 ENCOUNTER — Other Ambulatory Visit: Payer: Self-pay

## 2023-06-20 ENCOUNTER — Emergency Department (HOSPITAL_BASED_OUTPATIENT_CLINIC_OR_DEPARTMENT_OTHER)
Admission: EM | Admit: 2023-06-20 | Discharge: 2023-06-20 | Disposition: A | Discharge status: Home / Self Care | Payer: Medicare Other | Attending: Emergency Medicine | Admitting: Emergency Medicine

## 2023-06-20 ENCOUNTER — Encounter (HOSPITAL_BASED_OUTPATIENT_CLINIC_OR_DEPARTMENT_OTHER): Payer: Self-pay | Admitting: Emergency Medicine

## 2023-06-20 DIAGNOSIS — F0394 Unspecified dementia, unspecified severity, with anxiety: Secondary | ICD-10-CM | POA: Insufficient documentation

## 2023-06-20 DIAGNOSIS — R410 Disorientation, unspecified: Secondary | ICD-10-CM

## 2023-06-20 DIAGNOSIS — Z79899 Other long term (current) drug therapy: Secondary | ICD-10-CM | POA: Insufficient documentation

## 2023-06-20 DIAGNOSIS — R404 Transient alteration of awareness: Secondary | ICD-10-CM | POA: Diagnosis not present

## 2023-06-20 DIAGNOSIS — R112 Nausea with vomiting, unspecified: Secondary | ICD-10-CM | POA: Diagnosis not present

## 2023-06-20 DIAGNOSIS — I2510 Atherosclerotic heart disease of native coronary artery without angina pectoris: Secondary | ICD-10-CM | POA: Diagnosis not present

## 2023-06-20 DIAGNOSIS — R531 Weakness: Secondary | ICD-10-CM | POA: Diagnosis not present

## 2023-06-20 DIAGNOSIS — I129 Hypertensive chronic kidney disease with stage 1 through stage 4 chronic kidney disease, or unspecified chronic kidney disease: Secondary | ICD-10-CM | POA: Diagnosis not present

## 2023-06-20 DIAGNOSIS — N189 Chronic kidney disease, unspecified: Secondary | ICD-10-CM | POA: Insufficient documentation

## 2023-06-20 DIAGNOSIS — Z8673 Personal history of transient ischemic attack (TIA), and cerebral infarction without residual deficits: Secondary | ICD-10-CM | POA: Insufficient documentation

## 2023-06-20 DIAGNOSIS — R110 Nausea: Secondary | ICD-10-CM | POA: Insufficient documentation

## 2023-06-20 DIAGNOSIS — R4182 Altered mental status, unspecified: Secondary | ICD-10-CM | POA: Diagnosis not present

## 2023-06-20 DIAGNOSIS — E785 Hyperlipidemia, unspecified: Secondary | ICD-10-CM | POA: Insufficient documentation

## 2023-06-20 DIAGNOSIS — Z7982 Long term (current) use of aspirin: Secondary | ICD-10-CM | POA: Diagnosis not present

## 2023-06-20 DIAGNOSIS — F0390 Unspecified dementia without behavioral disturbance: Secondary | ICD-10-CM | POA: Insufficient documentation

## 2023-06-20 DIAGNOSIS — I700 Atherosclerosis of aorta: Secondary | ICD-10-CM | POA: Diagnosis not present

## 2023-06-20 DIAGNOSIS — Z1152 Encounter for screening for COVID-19: Secondary | ICD-10-CM | POA: Insufficient documentation

## 2023-06-20 DIAGNOSIS — R6883 Chills (without fever): Secondary | ICD-10-CM | POA: Diagnosis not present

## 2023-06-20 LAB — CBG MONITORING, ED: Glucose-Capillary: 113 mg/dL — ABNORMAL HIGH (ref 70–99)

## 2023-06-20 LAB — BASIC METABOLIC PANEL
Anion gap: 11 (ref 5–15)
BUN: 20 mg/dL (ref 8–23)
CO2: 25 mmol/L (ref 22–32)
Calcium: 9.6 mg/dL (ref 8.9–10.3)
Chloride: 102 mmol/L (ref 98–111)
Creatinine, Ser: 1.02 mg/dL — ABNORMAL HIGH (ref 0.44–1.00)
GFR, Estimated: 55 mL/min — ABNORMAL LOW (ref >60)
Glucose, Bld: 129 mg/dL — ABNORMAL HIGH (ref 70–99)
Potassium: 3.8 mmol/L (ref 3.5–5.1)
Sodium: 138 mmol/L (ref 135–145)

## 2023-06-20 LAB — URINALYSIS, ROUTINE W REFLEX MICROSCOPIC
Bacteria, UA: NONE SEEN
Bilirubin Urine: NEGATIVE
Glucose, UA: NEGATIVE mg/dL
Hgb urine dipstick: NEGATIVE
Ketones, ur: NEGATIVE mg/dL
Nitrite: NEGATIVE
Protein, ur: 30 mg/dL — AB
Specific Gravity, Urine: 1.02 (ref 1.005–1.030)
WBC, UA: >50 WBC/hpf (ref 0–5)
pH: 5.5 (ref 5.0–8.0)

## 2023-06-20 LAB — CBC
HCT: 38.6 % (ref 36.0–46.0)
Hemoglobin: 13 g/dL (ref 12.0–15.0)
MCH: 33 pg (ref 26.0–34.0)
MCHC: 33.7 g/dL (ref 30.0–36.0)
MCV: 98 fL (ref 80.0–100.0)
Platelets: 282 10*3/uL (ref 150–400)
RBC: 3.94 MIL/uL (ref 3.87–5.11)
RDW: 13 % (ref 11.5–15.5)
WBC: 5.5 10*3/uL (ref 4.0–10.5)
nRBC: 0 % (ref 0.0–0.2)

## 2023-06-20 LAB — RESP PANEL BY RT-PCR (RSV, FLU A&B, COVID)  RVPGX2
Influenza A by PCR: NEGATIVE
Influenza B by PCR: NEGATIVE
Resp Syncytial Virus by PCR: NEGATIVE
SARS Coronavirus 2 by RT PCR: NEGATIVE

## 2023-06-20 LAB — TROPONIN I (HIGH SENSITIVITY): Troponin I (High Sensitivity): 9 ng/L (ref <18)

## 2023-06-20 MED ORDER — SODIUM CHLORIDE 0.9 % IV BOLUS
500.0000 mL | Freq: Once | INTRAVENOUS | Status: AC
  Administered 2023-06-20: 500 mL via INTRAVENOUS

## 2023-06-20 MED ORDER — ONDANSETRON HCL 4 MG/2ML IJ SOLN
4.0000 mg | Freq: Once | INTRAMUSCULAR | Status: AC
  Administered 2023-06-20: 4 mg via INTRAVENOUS
  Filled 2023-06-20: qty 2

## 2023-06-20 MED ORDER — FOSFOMYCIN TROMETHAMINE 3 G PO PACK
3.0000 g | PACK | Freq: Once | ORAL | Status: AC
  Administered 2023-06-20: 3 g via ORAL
  Filled 2023-06-20: qty 3

## 2023-06-20 NOTE — ED Notes (Signed)
Tolerates 8oz ginger ale and crackers.

## 2023-06-20 NOTE — ED Triage Notes (Signed)
Pt arrived POV with husband c/o weakness, chills, N/V since last night. Pt's husband also advised she was disoriented last night but is more herself today. Pt caox4 at present. Pt further reports she was started on new med Galantamine 3 days ago.

## 2023-06-20 NOTE — ED Provider Notes (Signed)
[place] EMERGENCY DEPARTMENT AT [facility] Provider Note   CSN: [id number] Arrival date & time: 06/20/23  1500     History  Chief Complaint  Patient presents with   Weakness    Shannon Berger is a 82 y.o. female.  HPI   82 year old female with medical history significant for hypertension, carotid artery disease, HLD, TIA, anxiety, CKD, dementia who presents to the emergency department with weakness that is generalized, chills, nausea and inability to tolerate oral intake since last night.  She was confused last night but is now back to her baseline.  She is currently alert and oriented x 4.  She was started on a new medication galantamine 3 days ago and they are uncertain if that is causing the nausea.  She had some lightheadedness last night, denies any room spinning dizziness, no focal neurologic deficits, no facial droop, difficulty speaking or swallowing, no focal weakness or numbness.  No chest pain.  Home Medications Prior to Admission medications   Medication Sig Start Date End Date Taking? Authorizing Provider  galantamine (RAZADYNE ER) 8 MG 24 hr capsule Take 8 mg by mouth daily. 06/12/23  Yes [provider]  acetaminophen (TYLENOL) 500 MG tablet Take 500 mg by mouth every 6 (six) hours as needed for moderate pain or headache.    [provider]  ALPRAZolam Prudy Feeler) 0.5 MG tablet Take 0.25-0.5 mg by mouth at bedtime as needed for sleep or anxiety. 06/08/19   [provider]  amLODipine (NORVASC) 5 MG tablet TAKE 1 TABLET(5 MG) BY MOUTH DAILY 04/15/23   [place], [name], MD  Ascorbic Acid (VITAMIN C) 1000 MG tablet Take 1,000 mg by mouth every evening.    [provider]  aspirin EC 81 MG tablet Take 1 tablet (81 mg total) by mouth daily. Swallow whole. 05/30/23   Jonita Albee, PA-C  atorvastatin (LIPITOR) 40 MG tablet Take 40 mg by mouth at bedtime. 01/29/15   [provider]  buPROPion (WELLBUTRIN XL) 150 MG  24 hr tablet Take 150 mg by mouth every morning. Patient not taking: Reported on 05/30/2023 03/08/23   [provider]  Calcium Carbonate-Vit D-Min (CALCIUM 1200 PO) Take 1 tablet by mouth in the morning.    [provider]  Cholecalciferol (VITAMIN D) 125 MCG (5000 UT) CAPS Take 5,000 Units by mouth every evening.    [provider]  Cyanocobalamin (VITAMIN B-12) 5000 MCG TBDP Take by mouth at bedtime.    [provider]  denosumab (PROLIA) 60 MG/ML SOSY injection Inject 60 mg into the skin every 6 (six) months.    [provider]  desvenlafaxine (PRISTIQ) 100 MG 24 hr tablet Take 100 mg by mouth in the morning.    [provider]  ezetimibe (ZETIA) 10 MG tablet Take 10 mg by mouth at bedtime.    [provider]  lamoTRIgine (LAMICTAL) 200 MG tablet Take 200 mg by mouth 2 (two) times daily.    [provider]  Multiple Vitamin (MULTIVITAMIN) tablet Take 1 tablet by mouth in the morning.    [provider]  olmesartan (BENICAR) 40 MG tablet Take 40 mg by mouth in the morning. 03/15/20   [provider]  Omega-3 Fatty Acids (FISH OIL PO) Take 1 capsule by mouth in the morning and at bedtime.    [provider]  REPATHA SURECLICK 140 MG/ML SOAJ Inject 1 Syringe into the skin every 14 (fourteen) days. 07/11/20   [provider]  tretinoin (RETIN-A) 0.05 % cream Apply 1 Application topically daily as needed (skin blemishes.). Apply a pea sized amount to face    [provider]  zinc gluconate 50 MG tablet Take 50 mg by mouth at bedtime.    [provider]      Allergies    Oxycodone-acetaminophen, Versed [midazolam], and Hydrocodone-acetaminophen    Review of Systems   Review of Systems  All other systems reviewed and are negative.   Physical Exam Updated Vital Signs BP (!) 167/75   Pulse 76   Temp 98.9 F (37.2 C) (Oral)   Resp 20   Ht 5' (1.524 m)   Wt 47.6 kg    SpO2 100%   BMI 20.51 kg/m  Physical Exam Vitals and nursing note reviewed.  Constitutional:      General: She is not in acute distress.    Appearance: She is well-developed.  HENT:     Head: Normocephalic and atraumatic.  Eyes:     Conjunctiva/sclera: Conjunctivae normal.  Cardiovascular:     Rate and Rhythm: Normal rate and regular rhythm.  Pulmonary:     Effort: Pulmonary effort is normal. No respiratory distress.     Breath sounds: Normal breath sounds.  Abdominal:     Palpations: Abdomen is soft.     Tenderness: There is no abdominal tenderness.  Musculoskeletal:        General: No swelling.     Cervical back: Neck supple.  Skin:    General: Skin is warm and dry.     Capillary Refill: Capillary refill takes less than 2 seconds.  Neurological:     Mental Status: She is alert.     Comments: MENTAL STATUS EXAM:    Orientation: Alert and oriented to person, place and time.  Memory: Cooperative, follows commands well.  Language: Speech is clear and language is normal.   CRANIAL NERVES:    CN 2 (Optic): Visual fields intact to confrontation.  CN 3,4,6 (EOM): Pupils equal and reactive to light. Full extraocular eye movement without nystagmus.  CN 5 (Trigeminal): Facial sensation is normal, no weakness of masticatory muscles.  CN 7 (Facial): No facial weakness or asymmetry.  CN 8 (Auditory): Auditory acuity grossly normal.  CN 9,10 (Glossophar): The uvula is midline, the palate elevates symmetrically.  CN 11 (spinal access): Normal sternocleidomastoid and trapezius strength.  CN 12 (Hypoglossal): The tongue is midline. No atrophy or fasciculations.Marland Kitchen   MOTOR:  Muscle Strength: 5/5RUE, 5/5LUE, 5/5RLE, 5/5LLE.   COORDINATION:   Intention tremor bilaterally, mild dysmetria bilaterally which is chronic.   SENSATION:   Intact to light touch all four extremities.     Psychiatric:        Mood and Affect: Mood normal.     ED Results / Procedures / Treatments   Labs (all  labs ordered are listed, but only abnormal results are displayed) Labs Reviewed  BASIC METABOLIC PANEL - Abnormal; Notable for the following components:      Result Value   Glucose, Bld 129 (*)    Creatinine, Ser 1.02 (*)    GFR, Estimated 55 (*)    All other components within normal limits  URINALYSIS, ROUTINE W REFLEX MICROSCOPIC - Abnormal; Notable for the following components:   Protein, ur 30 (*)    Leukocytes,Ua MODERATE (*)    All other components within normal limits  CBG MONITORING, ED - Abnormal; Notable for the following components:   Glucose-Capillary 113 (*)    All other components within  normal limits  RESP PANEL BY RT-PCR (RSV, FLU A&B, COVID)  RVPGX2  CBC  TROPONIN I (HIGH SENSITIVITY)    EKG EKG Interpretation Date/Time:  Friday June 20 2023 15:44:27 EST Ventricular Rate:  68 PR Interval:  178 QRS Duration:  83 QT Interval:  398 QTC Calculation: 424 R Axis:   66  Text Interpretation: Sinus rhythm Confirmed by Ernie Avena ([id number]) on 06/20/2023 4:03:04 PM  Radiology DG Chest Portable 1 View Result Date: 06/20/2023 CLINICAL DATA:  Weakness nausea vomiting EXAM: PORTABLE CHEST 1 VIEW COMPARISON:  05/23/2023 FINDINGS: The heart size and mediastinal contours are within normal limits. Aortic atherosclerosis. Both lungs are clear. The visualized skeletal structures are unremarkable. Hardware in the lower spine. IMPRESSION: No active disease. Electronically Signed   By: Jasmine Pang M.D.   On: 06/20/2023 20:32   CT Head Wo Contrast Result Date: 06/20/2023 CLINICAL DATA:  Mental status change, unknown cause. EXAM: CT HEAD WITHOUT CONTRAST TECHNIQUE: Contiguous axial images were obtained from the base of the skull through the vertex without intravenous contrast. RADIATION DOSE REDUCTION: This exam was performed according to the departmental dose-optimization program which includes automated exposure control, adjustment of the mA and/or kV according to patient size  and/or use of iterative reconstruction technique. COMPARISON:  Head CT 02/12/2023. FINDINGS: Brain: No acute intracranial hemorrhage. Gray-white differentiation is preserved. No hydrocephalus or extra-axial collection. No mass effect or midline shift. Vascular: No hyperdense vessel or unexpected calcification. Skull: No calvarial fracture or suspicious bone lesion. Skull base is unremarkable. Sinuses/Orbits: Trace fluid in the right maxillary sinus. Orbits are unremarkable. Other: None. IMPRESSION: No acute intracranial abnormality. Electronically Signed   By: Orvan Falconer M.D.   On: 06/20/2023 19:26    Procedures Procedures    Medications Ordered in ED Medications  sodium chloride 0.9 % bolus 500 mL (0 mLs Intravenous Stopped 06/20/23 2002)  ondansetron (ZOFRAN) injection 4 mg (4 mg Intravenous Given 06/20/23 1853)  fosfomycin (MONUROL) packet 3 g (3 g Oral Given 06/20/23 2031)    ED Course/ Medical Decision Making/ A&P                                 Medical Decision Making Amount and/or Complexity of Data Reviewed Labs: ordered. Radiology: ordered.  Risk Prescription drug management.    82 year old female with medical history significant for hypertension, carotid artery disease, HLD, TIA, anxiety, CKD, dementia who presents to the emergency department with weakness that is generalized, chills, nausea and inability to tolerate oral intake since last night.  She was confused last night but is now back to her baseline.  She is currently alert and oriented x 4.  She was started on a new medication galantamine 3 days ago and they are uncertain if that is causing the nausea.  She had some lightheadedness last night, denies any room spinning dizziness, no focal neurologic deficits, no facial droop, difficulty speaking or swallowing, no focal weakness or numbness.  No chest pain.   On arrival, the patient was afebrile, not tachycardic or tachypneic, hemodynamically stable, BP 147/82,  saturating on percent on room air.  Complete initial physical exam performed, notably the patient  was neurologically intact, lungs CTAB, no abdominal tenderness.    Reviewed and confirmed nursing documentation for past medical history, family history, social history.    Initial Assessment:   With the patient's presentation of altered mental status, most likely diagnosis is delerium 2/2 infectious  etiology (UTI/CAP/URI) vs metabolic abnormality (Na/K/Mg/Ca) vs nonspecific etiology. Other diagnoses were considered including (but not limited to) CVA, ICH, intracranial mass, critical dehydration, heptatic dysfunction, uremia, hypercarbia, intoxication, endrocrine abnormality, toxidrome. These are considered less likely due to history of present illness and physical exam findings.   This is most consistent with an acute life/limb threatening illness complicated by underlying chronic conditions.  Initial Plan:  CTH to evaluate for intracranial etiology of patient's symptoms  Screening labs including CBC and Metabolic panel to evaluate for infectious or metabolic etiology of disease.  Urinalysis with reflex culture ordered to evaluate for UTI or relevant urologic/nephrologic pathology.  CXR to evaluate for structural/infectious intrathoracic pathology.  EKG to evaluate for cardiac pathology Objective evaluation as below reviewed   Initial Study Results:   Laboratory  All laboratory results reviewed without evidence of clinically relevant pathology.   Exceptions include: Urinalysis with moderate leukocytes, greater than 50 WBCs, no clear bacteria seen, considered potential developing UTI although not convincing and the patient is unable to provide significant symptoms of dysuria.  Given her transient confusion which appears to have resolved, will administer one-time dose of fosfomycin.  Patient is not appearing, not meeting SIRS criteria.  EKG EKG was reviewed independently. Rate, rhythm, axis,  intervals all examined and without medically relevant abnormality. ST segments without concerns for elevations.    Radiology:  All images reviewed independently. Agree with radiology report at this time.   DG Chest Portable 1 View Result Date: 06/20/2023 CLINICAL DATA:  Weakness nausea vomiting EXAM: PORTABLE CHEST 1 VIEW COMPARISON:  05/23/2023 FINDINGS: The heart size and mediastinal contours are within normal limits. Aortic atherosclerosis. Both lungs are clear. The visualized skeletal structures are unremarkable. Hardware in the lower spine. IMPRESSION: No active disease. Electronically Signed   By: Jasmine Pang M.D.   On: 06/20/2023 20:32   CT Head Wo Contrast Result Date: 06/20/2023 CLINICAL DATA:  Mental status change, unknown cause. EXAM: CT HEAD WITHOUT CONTRAST TECHNIQUE: Contiguous axial images were obtained from the base of the skull through the vertex without intravenous contrast. RADIATION DOSE REDUCTION: This exam was performed according to the departmental dose-optimization program which includes automated exposure control, adjustment of the mA and/or kV according to patient size and/or use of iterative reconstruction technique. COMPARISON:  Head CT 02/12/2023. FINDINGS: Brain: No acute intracranial hemorrhage. Gray-white differentiation is preserved. No hydrocephalus or extra-axial collection. No mass effect or midline shift. Vascular: No hyperdense vessel or unexpected calcification. Skull: No calvarial fracture or suspicious bone lesion. Skull base is unremarkable. Sinuses/Orbits: Trace fluid in the right maxillary sinus. Orbits are unremarkable. Other: None. IMPRESSION: No acute intracranial abnormality. Electronically Signed   By: Orvan Falconer M.D.   On: 06/20/2023 19:26   DG Chest Portable 1 View Result Date: 05/23/2023 CLINICAL DATA:  Chest pain. EXAM: PORTABLE CHEST 1 VIEW COMPARISON:  PA Lat 02/12/2023 FINDINGS: The cardiac size normal. The mediastinum is stable with aortic  tortuosity and atherosclerosis. The lungs are slightly emphysematous but clear apart from mild biapical scarring changes. Partially visible lumbar fusion hardware again is seen. There are left breast calcifications. IMPRESSION: No evidence of acute chest disease. Emphysema. Aortic atherosclerosis. Electronically Signed   By: Almira Bar M.D.   On: 05/23/2023 07:40        Final Assessment and Plan:   Patient with a transient episode of confusion, mild nausea and decreased oral intake yesterday.  Administered 500 cc bolus, IV Zofran, feeling symptomatically improved.  Symptoms could be due to  side effect of new medication galantamine.  Recommended that the patient follow-up with her prescribing physician regarding this medication as loss of appetite and nausea can be a side effect of the medication.  Patient tolerating oral intake, ambulatory and back to her mental baseline, overall stable for discharge at this time.    Final Clinical Impression(s) / ED Diagnoses Final diagnoses:  Weakness  Nausea  Transient confusion    Rx / DC Orders ED Discharge Orders     None         Ernie Avena, MD 06/20/23 2054

## 2023-06-20 NOTE — ED Notes (Signed)
Patient transported to CT 

## 2023-06-20 NOTE — Discharge Instructions (Addendum)
Workup was overall reassuring.  Your urinalysis showed no bacteria in your urine but some white blood cells and we empirically gave you a one-time dose of fosfomycin.  Your transient episode of confusion appears to have resolved and you are at your baseline.  Your neurologic exam was normal with no signs of stroke, your CT and x-ray imaging was reassuring.  The remaining labs were also reassuring with no evidence of significant electrolyte abnormality.  Your symptoms could be due to the new medication that you started galantamine, recommend you follow-up with the physician that prescribed the medication to discuss potential side effects.

## 2023-06-20 NOTE — ED Notes (Addendum)
Reviewed discharge instructions and home care with pt and husband. Pt and husband verbalized understanding and had no further questions. Pt exited ED without complications.

## 2023-06-20 NOTE — ED Notes (Signed)
Scant urine for sample-enough for testing-sent to lab. Still feels urge to void. Bladder scan performed post void- ~40mL on scan. Placed on external urinary catheter.

## 2023-06-24 DIAGNOSIS — M25559 Pain in unspecified hip: Secondary | ICD-10-CM | POA: Diagnosis not present

## 2023-06-24 DIAGNOSIS — I10 Essential (primary) hypertension: Secondary | ICD-10-CM | POA: Diagnosis not present

## 2023-06-24 DIAGNOSIS — R531 Weakness: Secondary | ICD-10-CM | POA: Diagnosis not present

## 2023-06-24 DIAGNOSIS — M419 Scoliosis, unspecified: Secondary | ICD-10-CM | POA: Diagnosis not present

## 2023-06-24 DIAGNOSIS — M546 Pain in thoracic spine: Secondary | ICD-10-CM | POA: Diagnosis not present

## 2023-06-25 DIAGNOSIS — H548 Legal blindness, as defined in USA: Secondary | ICD-10-CM | POA: Diagnosis not present

## 2023-06-25 DIAGNOSIS — H52202 Unspecified astigmatism, left eye: Secondary | ICD-10-CM | POA: Diagnosis not present

## 2023-06-25 DIAGNOSIS — H524 Presbyopia: Secondary | ICD-10-CM | POA: Diagnosis not present

## 2023-06-25 DIAGNOSIS — H5212 Myopia, left eye: Secondary | ICD-10-CM | POA: Diagnosis not present

## 2023-07-10 ENCOUNTER — Ambulatory Visit: Payer: Medicare Other | Admitting: Neurology

## 2023-07-10 DIAGNOSIS — M25559 Pain in unspecified hip: Secondary | ICD-10-CM | POA: Diagnosis not present

## 2023-07-10 DIAGNOSIS — M546 Pain in thoracic spine: Secondary | ICD-10-CM | POA: Diagnosis not present

## 2023-07-10 DIAGNOSIS — M419 Scoliosis, unspecified: Secondary | ICD-10-CM | POA: Diagnosis not present

## 2023-07-10 DIAGNOSIS — R531 Weakness: Secondary | ICD-10-CM | POA: Diagnosis not present

## 2023-07-15 DIAGNOSIS — H40002 Preglaucoma, unspecified, left eye: Secondary | ICD-10-CM | POA: Diagnosis not present

## 2023-07-17 DIAGNOSIS — M546 Pain in thoracic spine: Secondary | ICD-10-CM | POA: Diagnosis not present

## 2023-07-17 DIAGNOSIS — R531 Weakness: Secondary | ICD-10-CM | POA: Diagnosis not present

## 2023-07-17 DIAGNOSIS — M419 Scoliosis, unspecified: Secondary | ICD-10-CM | POA: Diagnosis not present

## 2023-07-17 DIAGNOSIS — M25559 Pain in unspecified hip: Secondary | ICD-10-CM | POA: Diagnosis not present

## 2023-07-21 DIAGNOSIS — H40012 Open angle with borderline findings, low risk, left eye: Secondary | ICD-10-CM | POA: Diagnosis not present

## 2023-07-22 DIAGNOSIS — R531 Weakness: Secondary | ICD-10-CM | POA: Diagnosis not present

## 2023-07-22 DIAGNOSIS — M25559 Pain in unspecified hip: Secondary | ICD-10-CM | POA: Diagnosis not present

## 2023-07-22 DIAGNOSIS — M419 Scoliosis, unspecified: Secondary | ICD-10-CM | POA: Diagnosis not present

## 2023-07-22 DIAGNOSIS — M546 Pain in thoracic spine: Secondary | ICD-10-CM | POA: Diagnosis not present

## 2023-07-24 DIAGNOSIS — M546 Pain in thoracic spine: Secondary | ICD-10-CM | POA: Diagnosis not present

## 2023-07-24 DIAGNOSIS — R531 Weakness: Secondary | ICD-10-CM | POA: Diagnosis not present

## 2023-07-24 DIAGNOSIS — M419 Scoliosis, unspecified: Secondary | ICD-10-CM | POA: Diagnosis not present

## 2023-07-24 DIAGNOSIS — M25559 Pain in unspecified hip: Secondary | ICD-10-CM | POA: Diagnosis not present

## 2023-07-29 DIAGNOSIS — M25559 Pain in unspecified hip: Secondary | ICD-10-CM | POA: Diagnosis not present

## 2023-07-29 DIAGNOSIS — M546 Pain in thoracic spine: Secondary | ICD-10-CM | POA: Diagnosis not present

## 2023-07-29 DIAGNOSIS — R531 Weakness: Secondary | ICD-10-CM | POA: Diagnosis not present

## 2023-07-29 DIAGNOSIS — M419 Scoliosis, unspecified: Secondary | ICD-10-CM | POA: Diagnosis not present

## 2023-07-31 DIAGNOSIS — Z8551 Personal history of malignant neoplasm of bladder: Secondary | ICD-10-CM | POA: Diagnosis not present

## 2023-07-31 DIAGNOSIS — M419 Scoliosis, unspecified: Secondary | ICD-10-CM | POA: Diagnosis not present

## 2023-07-31 DIAGNOSIS — M25559 Pain in unspecified hip: Secondary | ICD-10-CM | POA: Diagnosis not present

## 2023-07-31 DIAGNOSIS — M546 Pain in thoracic spine: Secondary | ICD-10-CM | POA: Diagnosis not present

## 2023-07-31 DIAGNOSIS — R531 Weakness: Secondary | ICD-10-CM | POA: Diagnosis not present

## 2023-08-18 DIAGNOSIS — H40012 Open angle with borderline findings, low risk, left eye: Secondary | ICD-10-CM | POA: Diagnosis not present

## 2023-08-21 DIAGNOSIS — R531 Weakness: Secondary | ICD-10-CM | POA: Diagnosis not present

## 2023-08-21 DIAGNOSIS — M546 Pain in thoracic spine: Secondary | ICD-10-CM | POA: Diagnosis not present

## 2023-08-21 DIAGNOSIS — M419 Scoliosis, unspecified: Secondary | ICD-10-CM | POA: Diagnosis not present

## 2023-08-21 DIAGNOSIS — M25559 Pain in unspecified hip: Secondary | ICD-10-CM | POA: Diagnosis not present

## 2023-08-26 DIAGNOSIS — R531 Weakness: Secondary | ICD-10-CM | POA: Diagnosis not present

## 2023-08-26 DIAGNOSIS — M546 Pain in thoracic spine: Secondary | ICD-10-CM | POA: Diagnosis not present

## 2023-08-26 DIAGNOSIS — M419 Scoliosis, unspecified: Secondary | ICD-10-CM | POA: Diagnosis not present

## 2023-08-26 DIAGNOSIS — M25559 Pain in unspecified hip: Secondary | ICD-10-CM | POA: Diagnosis not present

## 2023-09-02 DIAGNOSIS — M25559 Pain in unspecified hip: Secondary | ICD-10-CM | POA: Diagnosis not present

## 2023-09-02 DIAGNOSIS — R531 Weakness: Secondary | ICD-10-CM | POA: Diagnosis not present

## 2023-09-02 DIAGNOSIS — M419 Scoliosis, unspecified: Secondary | ICD-10-CM | POA: Diagnosis not present

## 2023-09-02 DIAGNOSIS — M546 Pain in thoracic spine: Secondary | ICD-10-CM | POA: Diagnosis not present

## 2023-09-02 DIAGNOSIS — C671 Malignant neoplasm of dome of bladder: Secondary | ICD-10-CM | POA: Diagnosis not present

## 2023-09-04 DIAGNOSIS — R531 Weakness: Secondary | ICD-10-CM | POA: Diagnosis not present

## 2023-09-04 DIAGNOSIS — R413 Other amnesia: Secondary | ICD-10-CM | POA: Diagnosis not present

## 2023-09-04 DIAGNOSIS — H40012 Open angle with borderline findings, low risk, left eye: Secondary | ICD-10-CM | POA: Diagnosis not present

## 2023-09-04 DIAGNOSIS — M546 Pain in thoracic spine: Secondary | ICD-10-CM | POA: Diagnosis not present

## 2023-09-04 DIAGNOSIS — M419 Scoliosis, unspecified: Secondary | ICD-10-CM | POA: Diagnosis not present

## 2023-09-04 DIAGNOSIS — M25559 Pain in unspecified hip: Secondary | ICD-10-CM | POA: Diagnosis not present

## 2023-09-09 DIAGNOSIS — C671 Malignant neoplasm of dome of bladder: Secondary | ICD-10-CM | POA: Diagnosis not present

## 2023-09-09 DIAGNOSIS — M25559 Pain in unspecified hip: Secondary | ICD-10-CM | POA: Diagnosis not present

## 2023-09-09 DIAGNOSIS — M419 Scoliosis, unspecified: Secondary | ICD-10-CM | POA: Diagnosis not present

## 2023-09-09 DIAGNOSIS — M546 Pain in thoracic spine: Secondary | ICD-10-CM | POA: Diagnosis not present

## 2023-09-09 DIAGNOSIS — Z5111 Encounter for antineoplastic chemotherapy: Secondary | ICD-10-CM | POA: Diagnosis not present

## 2023-09-09 DIAGNOSIS — R531 Weakness: Secondary | ICD-10-CM | POA: Diagnosis not present

## 2023-09-11 DIAGNOSIS — H47011 Ischemic optic neuropathy, right eye: Secondary | ICD-10-CM | POA: Diagnosis not present

## 2023-09-11 DIAGNOSIS — D649 Anemia, unspecified: Secondary | ICD-10-CM | POA: Diagnosis not present

## 2023-09-11 DIAGNOSIS — Z961 Presence of intraocular lens: Secondary | ICD-10-CM | POA: Diagnosis not present

## 2023-09-11 DIAGNOSIS — M419 Scoliosis, unspecified: Secondary | ICD-10-CM | POA: Diagnosis not present

## 2023-09-11 DIAGNOSIS — M810 Age-related osteoporosis without current pathological fracture: Secondary | ICD-10-CM | POA: Diagnosis not present

## 2023-09-11 DIAGNOSIS — E785 Hyperlipidemia, unspecified: Secondary | ICD-10-CM | POA: Diagnosis not present

## 2023-09-11 DIAGNOSIS — H35371 Puckering of macula, right eye: Secondary | ICD-10-CM | POA: Diagnosis not present

## 2023-09-11 DIAGNOSIS — H40002 Preglaucoma, unspecified, left eye: Secondary | ICD-10-CM | POA: Diagnosis not present

## 2023-09-11 DIAGNOSIS — M25559 Pain in unspecified hip: Secondary | ICD-10-CM | POA: Diagnosis not present

## 2023-09-11 DIAGNOSIS — R531 Weakness: Secondary | ICD-10-CM | POA: Diagnosis not present

## 2023-09-11 DIAGNOSIS — M546 Pain in thoracic spine: Secondary | ICD-10-CM | POA: Diagnosis not present

## 2023-09-16 DIAGNOSIS — M25559 Pain in unspecified hip: Secondary | ICD-10-CM | POA: Diagnosis not present

## 2023-09-16 DIAGNOSIS — C671 Malignant neoplasm of dome of bladder: Secondary | ICD-10-CM | POA: Diagnosis not present

## 2023-09-16 DIAGNOSIS — M419 Scoliosis, unspecified: Secondary | ICD-10-CM | POA: Diagnosis not present

## 2023-09-16 DIAGNOSIS — M546 Pain in thoracic spine: Secondary | ICD-10-CM | POA: Diagnosis not present

## 2023-09-16 DIAGNOSIS — R531 Weakness: Secondary | ICD-10-CM | POA: Diagnosis not present

## 2023-09-18 DIAGNOSIS — J479 Bronchiectasis, uncomplicated: Secondary | ICD-10-CM | POA: Diagnosis not present

## 2023-09-18 DIAGNOSIS — I129 Hypertensive chronic kidney disease with stage 1 through stage 4 chronic kidney disease, or unspecified chronic kidney disease: Secondary | ICD-10-CM | POA: Diagnosis not present

## 2023-09-18 DIAGNOSIS — R531 Weakness: Secondary | ICD-10-CM | POA: Diagnosis not present

## 2023-09-18 DIAGNOSIS — R82998 Other abnormal findings in urine: Secondary | ICD-10-CM | POA: Diagnosis not present

## 2023-09-18 DIAGNOSIS — M25559 Pain in unspecified hip: Secondary | ICD-10-CM | POA: Diagnosis not present

## 2023-09-18 DIAGNOSIS — M546 Pain in thoracic spine: Secondary | ICD-10-CM | POA: Diagnosis not present

## 2023-09-18 DIAGNOSIS — M419 Scoliosis, unspecified: Secondary | ICD-10-CM | POA: Diagnosis not present

## 2023-09-18 DIAGNOSIS — Z0000 Encounter for general adult medical examination without abnormal findings: Secondary | ICD-10-CM | POA: Diagnosis not present

## 2023-09-18 NOTE — Progress Notes (Signed)
 Cardiology Office Note    Date:  09/22/2023   ID:  Shannon, Berger 01-30-1941, MRN [id number]  PCP:  Rodrigo Ran, MD  Cardiologist:  Dr. [place]  Chief Complaint  Patient presents with   Coronary Artery Disease    History of Present Illness:  Shannon Berger is a 83 y.o. female is seen for follow up. She has a  past medical history of carotid artery disease, hypertension, hyperlipidemia, ischemic optic neuropathy, and osteopenia.  She was seen in 2016 for evaluation of chest pain.  She had a similar symptom in the past with normal stress echo in 2012 and normal ETT in 2008.  Myoview on 03/24/2015 which showed EF 68%, normal perfusion, overall low risk study.     She was seen by Shannon Course PA-C in October 2019 for symptoms of dyspnea on exertion. She had evaluation with an Echo showing normal LV systolic and valve function. Grade 2 diastolic dysfunction. Coronary CTA was done showing calcium score of 264 but nonobstructive CAD. It was felt that her scoliosis may be contributing to her dyspnea. She reported having PFTs but no results available for [place] to review.  In November 2019 she was admitted with aphasia and left sided weakness. CT and MRI showed no acute findings but carotid dopplers showed progression of carotid arterial disease with > 80% left carotid stenosis. She subsequently underwent left CEA by Dr Shannon Berger on 05/26/18 without complications. 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 This has been asymptomatic so managed conservatively.   She was seen in February 2023  for atypical chest pain for which she was evaluated at [facility] ED. A coronary CTA was done again August of 2023 and this showed nonobstructive CAD.   When seen previously she was getting low BP readings to 80 systolic. Was symptomatic. We stopped her lasix and potassium. She states she quit taking hydralazine as  well. We eventually got her on amlodipine and she states this has worked very well for her.   She denies any chest pain, dyspnea or palpitations. Has chronic back pain with prior surgeries.  Past Medical History:  Diagnosis Date   Anginal pain (HCC)    occasionally   Anxiety    Arthritis    Bladder cancer (HCC)    Carotid arterial disease (HCC)    Chronic kidney disease    Complication of anesthesia    "hallucinates for days; have a hard time waking up; almost couldn't get her awake last time; wake up wild" (05/26/2018)   Constipation    Degeneration of lumbar intervertebral disc    Dislocation of hip joint prosthesis (HCC)    Essential tremor    Fusion of spine of lumbar region    Heart murmur    "slight one" (05/26/2018)   Hyperlipidemia    Hypertension    Low back pain radiating to left lower extremity    Melanoma (HCC)    "center of upper chest"   Osteoarthritis    Osteopenia    Sleep difficulties    Spinal stenosis at L4-L5 level    Stroke [facility])    tia--last week   TIA (transient ischemic attack) 05/20/2018    Past Surgical History:  Procedure Laterality Date   ANTERIOR LUMBAR FUSION     added rods and screws;   BACK SURGERY     BLADDER SURGERY     BREAST BIOPSY Left    "  not cancer"   BUNIONECTOMY Left    CAROTID ENDARTERECTOMY Left 05/26/2018   CATARACT EXTRACTION W/ INTRAOCULAR LENS  IMPLANT, BILATERAL Bilateral    CYSTOSCOPY N/A 03/25/2023   Procedure: CYSTOSCOPY;  Surgeon: Shannon Paci, MD;  Location: [facility];  Service: Urology;  Laterality: N/A;   ENDARTERECTOMY Left 05/26/2018   Procedure: ENDARTERECTOMY CAROTID LEFT;  Surgeon: Shannon Hint, MD;  Location: [facility] OR;  Service: Vascular;  Laterality: Left;   EYE SURGERY Right    "retina wrinkle OR; now can't see out of that eye" (05/26/2018)   JOINT REPLACEMENT Left 2014   Hip   LUMBAR SPINE HARDWARE REMOVAL     "went in thru the front"   MELANOMA EXCISION     "center of upper  chest"   PATCH ANGIOPLASTY Left 05/26/2018   Procedure: PATCH ANGIOPLASTY LEFT CAROTID ARTERY;  Surgeon: Shannon Hint, MD;  Location: [facility] OR;  Service: Vascular;  Laterality: Left;   PILONIDAL CYST DRAINAGE     POSTERIOR LUMBAR FUSION  X 2   TENDON REPAIR Left    Reattachment of tendon- Left hip   THUMB FUSION Right    TONSILLECTOMY AND ADENOIDECTOMY     TOTAL HIP ARTHROPLASTY Left 09/04/2012   Procedure: LEFT TOTAL HIP ARTHROPLASTY ANTERIOR APPROACH;  Surgeon: Shannon Hitch, MD;  Location: [facility];  Service: Orthopedics;  Laterality: Left;  Left Total Hip Arthroplasty, Anterior Approach   TRANSURETHRAL RESECTION OF BLADDER TUMOR N/A 06/06/2020   Procedure: TRANSURETHRAL RESECTION OF BLADDER TUMOR(TURBT) WITH CYSTOSCOPY/ INSTILLATION OF GEMCITABINE WITH RIGHT STENT PLACEMENT;  Surgeon: Shannon Paci, MD;  Location: [facility];  Service: Urology;  Laterality: N/A;   TRANSURETHRAL RESECTION OF BLADDER TUMOR N/A 03/25/2023   Procedure: TRANSURETHRAL RESECTION OF BLADDER TUMOR (TURBT);  Surgeon: Shannon Paci, MD;  Location: [facility];  Service: Urology;  Laterality: N/A;  30 MINUTES    Current Medications: Outpatient Medications Prior to Visit  Medication Sig Dispense Refill   acetaminophen (TYLENOL) 500 MG tablet Take 500 mg by mouth every 6 (six) hours as needed for moderate pain or headache.     ALPRAZolam (XANAX) 0.5 MG tablet Take 0.25-0.5 mg by mouth at bedtime as needed for sleep or anxiety.     amLODipine (NORVASC) 5 MG tablet TAKE 1 TABLET(5 MG) BY MOUTH DAILY 90 tablet 2   Ascorbic Acid (VITAMIN C) 1000 MG tablet Take 1,000 mg by mouth every evening.     aspirin EC 81 MG tablet Take 1 tablet (81 mg total) by mouth daily. Swallow whole. (Patient taking differently: Take 325 mg by mouth daily. Swallow whole.)     atorvastatin (LIPITOR) 40 MG tablet Take 40 mg by mouth at bedtime.  3   Calcium Carbonate-Vit D-Min (CALCIUM 1200 PO) Take 1 tablet by mouth  in the morning.     Cyanocobalamin (VITAMIN B-12) 5000 MCG TBDP Take by mouth at bedtime.     denosumab (PROLIA) 60 MG/ML SOSY injection Inject 60 mg into the skin every 6 (six) months.     desvenlafaxine (PRISTIQ) 100 MG 24 hr tablet Take 100 mg by mouth in the morning.     ezetimibe (ZETIA) 10 MG tablet Take 10 mg by mouth at bedtime.     lamoTRIgine (LAMICTAL) 200 MG tablet Take 100 mg by mouth daily.     Multiple Vitamin (MULTIVITAMIN) tablet Take 1 tablet by mouth in the morning.     olmesartan (BENICAR) 40 MG tablet Take 40 mg by mouth in the  morning.     REPATHA SURECLICK 140 MG/ML SOAJ Inject 1 Syringe into the skin every 14 (fourteen) days.     tretinoin (RETIN-A) 0.05 % cream Apply 1 Application topically daily as needed (skin blemishes.). Apply a pea sized amount to face     zinc gluconate 50 MG tablet Take 50 mg by mouth at bedtime.     buPROPion (WELLBUTRIN XL) 150 MG 24 hr tablet Take 150 mg by mouth every morning. (Patient not taking: Reported on 09/22/2023)     Cholecalciferol (VITAMIN D) 125 MCG (5000 UT) CAPS Take 5,000 Units by mouth every evening. (Patient not taking: Reported on 09/22/2023)     galantamine (RAZADYNE ER) 8 MG 24 hr capsule Take 8 mg by mouth daily. (Patient not taking: Reported on 09/22/2023)     Omega-3 Fatty Acids (FISH OIL PO) Take 1 capsule by mouth in the morning and at bedtime. (Patient not taking: Reported on 09/22/2023)     Facility-Administered Medications Prior to Visit  Medication Dose Route Frequency Provider Last Rate Last Admin   gemcitabine (GEMZAR) chemo syringe for bladder instillation 2,000 mg  2,000 mg Bladder Instillation Once Winter, Dorian Furnace, MD         Allergies:   Oxycodone-acetaminophen, Versed [midazolam], and Hydrocodone-acetaminophen   Social History   Socioeconomic History   Marital status: Married    Spouse name: jerry   Number of children: 2   Years of education: Not on file   Highest education level: Bachelor's  degree (e.g., BA, AB, BS)  Occupational History   Occupation: Research officer, political party: RETIRED    Comment: retired  Tobacco Use   Smoking status: Never   Smokeless tobacco: Never  Vaping Use   Vaping status: Never Used  Substance and Sexual Activity   Alcohol use: Not Currently    Comment: 1 a month   Drug use: Never   Sexual activity: Not Currently    Partners: Male    Birth control/protection: Post-menopausal    Comment: married  Other Topics Concern   Not on file  Social History Narrative   Pt lives at home with spouse jerry, 2 story home has 2 children   She is right handed, she drinks ginger ale and come tea, no coffee, or soda   Social Drivers of Corporate investment banker Strain: Not on file  Food Insecurity: Not on file  Transportation Needs: No Transportation Needs (02/20/2023)   PRAPARE - Administrator, Civil Service (Medical): No    Lack of Transportation (Non-Medical): No  Physical Activity: Not on file  Stress: Not on file  Social Connections: Not on file     Family History:  The patient's family history includes Cancer in her brother and mother; Diabetes in her father; Healthy in her son; Heart attack in her father; Heart disease in her father; Heart failure in her mother; Hypertension in her father and mother; Migraines in her daughter; Other in her mother.   ROS:   Please see the history of present illness.    ROS All other systems reviewed and are negative.   PHYSICAL EXAM:   VS:  BP 120/64   Pulse 68   Ht 5\' 10"  (1.778 m)   Wt 101 lb 9.6 oz (46.1 kg)   SpO2 97%   BMI 14.58 kg/m    GEN: Well nourished, well developed, in no acute distress  HEENT: normal  Neck: no JVD, carotid bruits, or masses Cardiac: RRR;  no murmurs, rubs, or gallops,no edema  Respiratory:  clear to auscultation bilaterally, normal work of breathing GI: soft, nontender, nondistended, + BS MS: no deformity or atrophy  Skin: warm and dry, no rash Neuro:   Alert and Oriented x 3, Strength and sensation are intact Psych: euthymic mood, full affect  Wt Readings from Last 3 Encounters:  09/22/23 101 lb 9.6 oz (46.1 kg)  06/20/23 105 lb (47.6 kg)  05/30/23 104 lb 6.4 oz (47.4 kg)      Studies/Labs Reviewed:    Recent Labs: 02/12/2023: ALT 26 06/20/2023: BUN 20; Creatinine, Ser 1.02; Hemoglobin 13.0; Platelets 282; Potassium 3.8; Sodium 138   Lipid Panel    Component Value Date/Time   CHOL 174 05/21/2018 0412   TRIG 59 05/21/2018 0412   HDL 71 05/21/2018 0412   CHOLHDL 2.5 05/21/2018 0412   VLDL 12 05/21/2018 0412   LDLCALC 91 05/21/2018 0412   Dated 04/20/21: creatinine 1.43. otherwise CMET and CBC normal. Dated 06/15/21: cholesterol 110, triglycerides 41, HDL 75, LDL 27.  Dated 07/16/22: cholesterol 123, triglycerides 53, HDL 85, LDL 27. BUN 41, creatinine 1.2. otherwise CMET and TSH normal.  Dated 09/11/23: cholesterol 115, triglycerides 67, HDL 85, LDL 17, CMET and TSH normal.       Additional studies/ records that were reviewed today include:   Myoview 03/24/2015 Study Highlights   The left ventricular ejection fraction is hyperdynamic (>65%). Nuclear stress EF: 68%. There was no ST segment deviation noted during stress. The study is normal. This is a low risk study. Normal myocardial perfusion and function   Low risk lexiscan nuclear stress test demonstrating normal myocardial perfusion and function; EF 68%,   Echo 05/21/18: Study Conclusions   - Left ventricle: The cavity size was normal. Wall thickness was   normal. Systolic function was normal. The estimated ejection   fraction was in the range of 60% to 65%. Wall motion was normal;   there were no regional wall motion abnormalities. Features are   consistent with a pseudonormal left ventricular filling pattern,   with concomitant abnormal relaxation and increased filling   pressure (grade 2 diastolic dysfunction). - Mitral valve: There was mild regurgitation. -  Pulmonary arteries: Systolic pressure was mildly increased. PA   peak pressure: 34 mm Hg (S).  ADDENDUM REPORT: 06/01/2018 22:20   CLINICAL DATA:  Chest pain   EXAM: Cardiac CTA   MEDICATIONS: Sub lingual nitro. 4mg  x 2   TECHNIQUE: The patient was scanned on a Siemens 192 slice scanner. Gantry rotation speed was 250 msecs. Collimation was 0.6 mm. A 100 kV prospective scan was triggered in the ascending thoracic aorta at 35-75% of the R-R interval. Average HR during the scan was 60 bpm. The 3D data set was interpreted on a dedicated work station using MPR, MIP and VRT modes. A total of 80cc of contrast was used.   FINDINGS: Non-cardiac: See separate report from [facility].   The pulmonary veins drained normally to the left atrium.   Calcium Score: 260 Agatston units.   Coronary Arteries: Right dominant with no anomalies   LM: No plaque or stenosis.   LAD system: Calcified plaque proximal LAD with mild (<50%) stenosis.   Circumflex system: Moderate ramus with no significant disease. The AV LCx was relatively small with no plaque or stenosis.   RCA system: Calcified plaque with mild stenosis (<50%) in the proximal RCA.   IMPRESSION: 1. Coronary artery calcium score 260 Agatston units. This places the patient in  the 73rd percentile for age and gender, suggesting intermediate risk for future cardiac events.   2.  Nonobstructive CAD.   Dalton Mclean   OVER-READ INTERPRETATION  CT CHEST   The following report is an over-read performed by radiologist Dr. Charlett Nose of [facility] on 06/01/2018. This over-read does not include interpretation of cardiac or coronary anatomy or pathology. The coronary CTA interpretation by the cardiologist is attached.   COMPARISON:  Chest CT 12/26/2015   FINDINGS: Vascular: Tortuous aorta.  No aneurysm.  Heart is normal size.   Mediastinum/Nodes: No adenopathy in the lower mediastinum or hila.    Lungs/Pleura: No confluent opacities or effusions.   Upper Abdomen: Small low-density lesion in the right hepatic lobe is stable most compatible with cyst. No acute findings   Musculoskeletal: Chest wall soft tissues are unremarkable. No acute bony abnormality.   IMPRESSION: No acute extra cardiac abnormality.   Tortuous aorta.   Electronically Signed: By: Charlett Nose M.D. On: 06/01/2018 11:45  Coronary CTA 02/07/22:  EXAM: OVER-READ INTERPRETATION  CT CHEST   The following report is a limited chest CT over-read performed by radiologist Dr. Irma [facility] on 02/07/2022. This over-read does not include interpretation of cardiac or coronary anatomy or pathology. The coronary CTA interpretation by the cardiologist is attached.   COMPARISON:  Chest CTA 02/06/2021   FINDINGS: Mediastinum/Nodes: No enlarged lymph nodes within the visualized mediastinum.   Lungs/Pleura: There is no pleural effusion. Stable mild bronchiectasis and scarring, primarily within the right middle lobe and lingula. No airspace disease or suspicious pulmonary nodule.   Upper abdomen: The visualized upper abdomen appears stable without significant findings. Probable cyst in the dome of the right hepatic lobe appears unchanged.   Musculoskeletal/Chest wall: No chest wall mass or suspicious osseous findings within the visualized chest. Stable chronic compression deformities at T11 and T12.   IMPRESSION: No significant extracardiac findings within the visualized chest.     Electronically Signed   By: Carey Bullocks M.D.   On: 02/07/2022 14:55    Addended by Carey Bullocks, MD on 02/07/2022  2:57 PM   Study Result  Narrative & Impression  CLINICAL DATA:  79F with hypertension, hyperlipidemia, carotid stenosis, and dyspnea.   EXAM: Cardiac/Coronary  CT   TECHNIQUE: The patient was scanned on a [employer].   FINDINGS: A 120 kV prospective scan was  triggered in the descending thoracic aorta at 111 HU's. Axial non-contrast 3 mm slices were carried out through the heart. The data set was analyzed on a dedicated work station and scored using the Agatson method. Gantry rotation speed was 250 msecs and collimation was .6 mm. No beta blockade and 0.8 mg of sl NTG was given. The 3D data set was reconstructed in 5% intervals of the 67-82 % of the R-R cycle. Diastolic phases were analyzed on a dedicated work station using MPR, MIP and VRT modes. The patient received 80 cc of contrast.   Aorta: Normal size.  No calcifications.  No dissection.   Aortic Valve:  Trileaflet.  No calcifications.   Coronary Arteries:  Normal coronary origin.  Right dominance.   RCA is a large dominant artery that gives rise to PDA and PLVB. There is moderate(50-69%) calcified plaque in the proximal RCA and minimal (<25%) calcified plaque in the mid vessel.   Left main is a large artery that gives rise to LAD and LCX arteries.   LAD is a large vessel that has mild (  25-49%) calcified plaque proximally and minimal (<25%) soft plaque distally. D1 is a small vessel without plaque.   LCX is a non-dominant artery.  There is no plaque.   Coronary Calcium Score:   Left main: 0   Left anterior descending artery: 223   Left circumflex artery: 0   Right coronary artery: 104   Total: 326   Percentile: 72nd   Other findings:   Normal pulmonary vein drainage into the left atrium.   Normal let atrial appendage without a thrombus.   Normal size of the pulmonary artery.   Mild mitral annular calcification.   IMPRESSION: 1. Coronary calcium score of 326. This was 72nd percentile for age-, race-, and sex-matched controls.   2. Normal coronary origin with right dominance.   3. Moderate (50-69%) plaque in the RCA and mild (25-49%) plaque in the LAD. CAD-RADS 3.   4.  Will send study for FFRct.   Interpretation of the non-cardiac intrathoracic  structures by Radiology is pending.   Chilton Si, MD   Electronically Signed: By: Chilton Si M.D. On: 02/07/2022 13:17    FFRCT ANALYSIS for abnormal coronary CT-A.   FINDINGS: FFRct analysis was performed on the original cardiac CT angiogram dataset. Diagrammatic representation of the FFRct analysis is provided in a separate PDF document in PACS. This dictation was created using the PDF document and an interactive 3D model of the results. 3D model is not available in the EMR/PACS. Normal FFR range is >0.80.   1. Left Main: FFRct 0.99   2. LAD: FFRct 0.92 proximal, 0.91 mid   3. LCX: FFRct 0.97   4. Ramus: FFRct 0.95   5. RCA: FFRct 0.94 proximal, 0.91 mid, 0.91 distal   IMPRESSION: FFRct findings are consistent with non-obstructive CAD.   Tiffany C. Duke Salvia, MD     Electronically Signed   By: Chilton Si M.D.   On: 02/07/2022 13:22    ASSESSMENT:    1. CAD in native artery   2. Carotid stenosis, bilateral   3. Primary hypertension   4. Hyperlipidemia, unspecified hyperlipidemia type         PLAN:  In order of problems listed above:  CAD.  Had nonobstructive CAD by CTA in 2019. Repeat CTA Aug 2023 again showed nonobstructive CAD. Normal FFR evaluation. No angina. Continue to focus on risk factor modification.   Bilateral carotid artery disease: s/p left CEA. Followed by [facility]. Carotid dopplers planned in April  Hypertension: Blood pressure readings are excellent  today. Continue current therapy  Hyperlipidemia:on lipitor, Zetia and Repatha. Goal LDL < 70. Last LDL 17  5.   Mesenteric stenosis - celiac axis and superior mesenteric arteries 70%. Followed by [facility].     Medication Adjustments/Labs and Tests Ordered: Current medicines are reviewed at length with the patient today.  Concerns regarding medicines are outlined above.  Medication changes, Labs and Tests ordered today are listed in the Patient Instructions below. Patient  Instructions  Medication Instructions:   *If you need a refill on your cardiac medications before your next appointment, please call your pharmacy*   Lab Work:  If you have labs (blood work) drawn today and your tests are completely normal, you will receive your results only by: MyChart Message (if you have MyChart) OR A paper copy in the mail If you have any lab test that is abnormal or we need to change your treatment, we will call you to review the results.   Testing/Procedures:    Follow-Up: At  [facility], you and your health needs are our priority.  As part of our continuing mission to provide you with exceptional heart care, we have created designated Provider Care Teams.  These Care Teams include your primary Cardiologist (physician) and Advanced Practice Providers (APPs -  Physician Assistants and Nurse Practitioners) who all work together to provide you with the care you need, when you need it.  We recommend signing up for the patient portal called "MyChart".  Sign up information is provided on this After Visit Summary.  MyChart is used to connect with patients for Virtual Visits (Telemedicine).  Patients are able to view lab/test results, encounter notes, upcoming appointments, etc.  Non-urgent messages can be sent to your provider as well.   To learn more about what you can do with MyChart, go to [email or web address].    Your next appointment:   6 months  Provider:       Other Instructions     Signed, Jocob Dambach [place], MD  09/22/2023 1:34 PM    [facility] [address] [place], [place], [place]  [address] Phone: [phone]; Fax: [phone]

## 2023-09-22 ENCOUNTER — Encounter: Payer: Self-pay | Admitting: Cardiology

## 2023-09-22 ENCOUNTER — Ambulatory Visit: Payer: Medicare Other | Attending: Cardiology | Admitting: Cardiology

## 2023-09-22 VITALS — BP 120/64 | HR 68 | Ht 70.0 in | Wt 101.6 lb

## 2023-09-22 DIAGNOSIS — I2510 Atherosclerotic heart disease of native coronary artery without angina pectoris: Secondary | ICD-10-CM

## 2023-09-22 DIAGNOSIS — I6523 Occlusion and stenosis of bilateral carotid arteries: Secondary | ICD-10-CM

## 2023-09-22 DIAGNOSIS — E785 Hyperlipidemia, unspecified: Secondary | ICD-10-CM | POA: Diagnosis not present

## 2023-09-22 DIAGNOSIS — I10 Essential (primary) hypertension: Secondary | ICD-10-CM

## 2023-09-22 NOTE — Patient Instructions (Signed)
 Medication Instructions:  Continue same medications *If you need a refill on your cardiac medications before your next appointment, please call your pharmacy*   Lab Work: None ordered   Testing/Procedures: None ordered   Follow-Up: At [facility], you and your health needs are our priority.  As part of our continuing mission to provide you with exceptional heart care, we have created designated Provider Care Teams.  These Care Teams include your primary Cardiologist (physician) and Advanced Practice Providers (APPs -  Physician Assistants and Nurse Practitioners) who all work together to provide you with the care you need, when you need it.  We recommend signing up for the patient portal called "MyChart".  Sign up information is provided on this After Visit Summary.  MyChart is used to connect with patients for Virtual Visits (Telemedicine).  Patients are able to view lab/test results, encounter notes, upcoming appointments, etc.  Non-urgent messages can be sent to your provider as well.   To learn more about what you can do with MyChart, go to [email or web address].    Your next appointment:  6 months   Call in July to schedule Sept appointment     Provider:  Dr.Jordan

## 2023-09-23 ENCOUNTER — Ambulatory Visit: Payer: Medicare Other | Admitting: Diagnostic Neuroimaging

## 2023-09-23 ENCOUNTER — Encounter: Payer: Self-pay | Admitting: Diagnostic Neuroimaging

## 2023-09-23 VITALS — BP 122/61 | Ht 61.0 in | Wt 102.0 lb

## 2023-09-23 DIAGNOSIS — R413 Other amnesia: Secondary | ICD-10-CM | POA: Diagnosis not present

## 2023-09-23 NOTE — Patient Instructions (Signed)
 MILD MEMORY DISTURBANCE (contributing factors --> vision impairment, hearing impairment, anxiety; MoCA 23/30; elevated [id number])  - symptoms could be due to mild cognitive impairment vs other contributing factors above; discussed addl testing options and benefits / risks of amyloid targeting therapies; she will think about it and let me know  - try to stay active physically and get some exercise (at least 15-30 minutes per day) - eat a nutritious diet with lean protein, plants / vegetables, whole grains; avoid ultra-processed foods - increase social activities, brain stimulation, games, puzzles, hobbies, crafts, arts, music; try new activities; keep it fun! - aim for at least 7-8 hours sleep per night (or more) - avoid smoking and alcohol - caution with medications, finances; not currently driving - safety / supervision issues reviewed

## 2023-09-23 NOTE — Progress Notes (Signed)
 [facility]  PATIENT: Shannon Berger DOB: 1941-06-12  REFERRING CLINICIAN: Rodrigo Ran, MD HISTORY FROM: patient  REASON FOR VISIT: new consult   HISTORICAL  CHIEF COMPLAINT:  Chief Complaint  Patient presents with   Memory Loss    RM 7 with spouse Pt is well, reports she has noticed she is forgetting names of people she should know and easily looses her train of thought.  Does ADLs alone  No known family history of memory loss.     HISTORY OF PRESENT ILLNESS:   83 year old female here for evaluation of memory loss.  For past 5 years patient has noted some mild memory loss issues, forgetting people's names, losing train of thought.  No change in ADLs.  Husband has noted some mild changes but does not think it is that significant.  Patient's children have noticed some mild changes as well.  She also has some longstanding issues with anxiety, depression, insomnia.  Has been to PCP and had pTau217 serum testing which was slightly elevated.  Patient referred here for further evaluation and treatment options.  Of note patient has had decline in vision and hearing ability over the last few years.  Has some history of essential tremor.  Also has some mild sleep apnea on treatment.   REVIEW OF SYSTEMS: Full 14 system review of systems performed and negative with exception of: as per HPI.  ALLERGIES: Allergies  Allergen Reactions   Oxycodone-Acetaminophen     hallucinations   Versed [Midazolam] Other (See Comments)    Had trouble waking up    Hydrocodone-Acetaminophen     hallucination     HOME MEDICATIONS: Outpatient Medications Prior to Visit  Medication Sig Dispense Refill   acetaminophen (TYLENOL) 500 MG tablet Take 500 mg by mouth every 6 (six) hours as needed for moderate pain or headache.     ALPRAZolam (XANAX) 0.5 MG tablet Take 0.25-0.5 mg by mouth at bedtime as needed for sleep or anxiety.     amLODipine (NORVASC) 5 MG tablet TAKE 1  TABLET(5 MG) BY MOUTH DAILY 90 tablet 2   Ascorbic Acid (VITAMIN C) 1000 MG tablet Take 1,000 mg by mouth every evening.     aspirin EC 325 MG tablet Take 325 mg by mouth daily.     atorvastatin (LIPITOR) 40 MG tablet Take 40 mg by mouth at bedtime.  3   Cyanocobalamin (VITAMIN B-12) 5000 MCG TBDP Take by mouth at bedtime.     denosumab (PROLIA) 60 MG/ML SOSY injection Inject 60 mg into the skin every 6 (six) months.     desvenlafaxine (PRISTIQ) 100 MG 24 hr tablet Take 100 mg by mouth in the morning.     ezetimibe (ZETIA) 10 MG tablet Take 10 mg by mouth at bedtime.     lamoTRIgine (LAMICTAL) 200 MG tablet Take 100 mg by mouth daily.     Multiple Vitamin (MULTIVITAMIN) tablet Take 1 tablet by mouth in the morning.     olmesartan (BENICAR) 40 MG tablet Take 40 mg by mouth in the morning.     REPATHA SURECLICK 140 MG/ML SOAJ Inject 1 Syringe into the skin every 14 (fourteen) days.     tretinoin (RETIN-A) 0.05 % cream Apply 1 Application topically daily as needed (skin blemishes.). Apply a pea sized amount to face     zinc gluconate 50 MG tablet Take 50 mg by mouth at bedtime.     aspirin EC 81 MG tablet Take 1 tablet (81 mg total) by  mouth daily. Swallow whole. (Patient not taking: Reported on 09/23/2023)     Calcium Carbonate-Vit D-Min (CALCIUM 1200 PO) Take 1 tablet by mouth in the morning. (Patient not taking: Reported on 09/23/2023)     Facility-Administered Medications Prior to Visit  Medication Dose Route Frequency Provider Last Rate Last Admin   gemcitabine (GEMZAR) chemo syringe for bladder instillation 2,000 mg  2,000 mg Bladder Instillation Once Winter, Dorian Furnace, MD        PAST MEDICAL HISTORY: Past Medical History:  Diagnosis Date   Anginal pain (HCC)    occasionally   Anxiety    Arthritis    Bladder cancer (HCC)    Carotid arterial disease (HCC)    Chronic kidney disease    Complication of anesthesia    "hallucinates for days; have a hard time waking up; almost  couldn't get her awake last time; wake up wild" (05/26/2018)   Constipation    Degeneration of lumbar intervertebral disc    Dislocation of hip joint prosthesis (HCC)    Essential tremor    Fusion of spine of lumbar region    Heart murmur    "slight one" (05/26/2018)   Hyperlipidemia    Hypertension    Low back pain radiating to left lower extremity    Melanoma (HCC)    "center of upper chest"   Osteoarthritis    Osteopenia    Sleep difficulties    Spinal stenosis at L4-L5 level    Stroke [facility])    tia--last week   TIA (transient ischemic attack) 05/20/2018    PAST SURGICAL HISTORY: Past Surgical History:  Procedure Laterality Date   ANTERIOR LUMBAR FUSION     added rods and screws;   BACK SURGERY     BLADDER SURGERY     BREAST BIOPSY Left    "not cancer"   BUNIONECTOMY Left    CAROTID ENDARTERECTOMY Left 05/26/2018   CATARACT EXTRACTION W/ INTRAOCULAR LENS  IMPLANT, BILATERAL Bilateral    CYSTOSCOPY N/A 03/25/2023   Procedure: CYSTOSCOPY;  Surgeon: Rene Paci, MD;  Location: [facility];  Service: Urology;  Laterality: N/A;   ENDARTERECTOMY Left 05/26/2018   Procedure: ENDARTERECTOMY CAROTID LEFT;  Surgeon: Chuck Hint, MD;  Location: [facility] OR;  Service: Vascular;  Laterality: Left;   EYE SURGERY Right    "retina wrinkle OR; now can't see out of that eye" (05/26/2018)   JOINT REPLACEMENT Left 2014   Hip   LUMBAR SPINE HARDWARE REMOVAL     "went in thru the front"   MELANOMA EXCISION     "center of upper chest"   PATCH ANGIOPLASTY Left 05/26/2018   Procedure: PATCH ANGIOPLASTY LEFT CAROTID ARTERY;  Surgeon: Chuck Hint, MD;  Location: [facility] OR;  Service: Vascular;  Laterality: Left;   PILONIDAL CYST DRAINAGE     POSTERIOR LUMBAR FUSION  X 2   TENDON REPAIR Left    Reattachment of tendon- Left hip   THUMB FUSION Right    TONSILLECTOMY AND ADENOIDECTOMY     TOTAL HIP ARTHROPLASTY Left 09/04/2012   Procedure: LEFT TOTAL HIP ARTHROPLASTY  ANTERIOR APPROACH;  Surgeon: Kathryne Hitch, MD;  Location: [facility];  Service: Orthopedics;  Laterality: Left;  Left Total Hip Arthroplasty, Anterior Approach   TRANSURETHRAL RESECTION OF BLADDER TUMOR N/A 06/06/2020   Procedure: TRANSURETHRAL RESECTION OF BLADDER TUMOR(TURBT) WITH CYSTOSCOPY/ INSTILLATION OF GEMCITABINE WITH RIGHT STENT PLACEMENT;  Surgeon: Rene Paci, MD;  Location: [facility];  Service: Urology;  Laterality: N/A;  TRANSURETHRAL RESECTION OF BLADDER TUMOR N/A 03/25/2023   Procedure: TRANSURETHRAL RESECTION OF BLADDER TUMOR (TURBT);  Surgeon: Rene Paci, MD;  Location: [facility];  Service: Urology;  Laterality: N/A;  30 MINUTES    FAMILY HISTORY: Family History  Problem Relation Age of Onset   Heart failure Mother    Cancer Mother        colon   Hypertension Mother    Other Mother        Varicose Veins   Diabetes Father    Heart disease Father    Hypertension Father    Heart attack Father    Cancer Brother        squamos cell - head, neck, ear   Healthy Son    Migraines Daughter     SOCIAL HISTORY: Social History   Socioeconomic History   Marital status: Married    Spouse name: jerry   Number of children: 2   Years of education: Not on file   Highest education level: Bachelor's degree (e.g., BA, AB, BS)  Occupational History   Occupation: Research officer, political party: RETIRED    Comment: retired  Tobacco Use   Smoking status: Never   Smokeless tobacco: Never  Vaping Use   Vaping status: Never Used  Substance and Sexual Activity   Alcohol use: Not Currently    Comment: 1 a month   Drug use: Never   Sexual activity: Not Currently    Partners: Male    Birth control/protection: Post-menopausal    Comment: married  Other Topics Concern   Not on file  Social History Narrative   Pt lives at home with spouse jerry, 2 story home has 2 children   She is right handed, she drinks ginger ale and come tea, no coffee, or soda    Social Drivers of Corporate investment banker Strain: Not on file  Food Insecurity: Not on file  Transportation Needs: No Transportation Needs (02/20/2023)   PRAPARE - Administrator, Civil Service (Medical): No    Lack of Transportation (Non-Medical): No  Physical Activity: Not on file  Stress: Not on file  Social Connections: Not on file  Intimate Partner Violence: Not on file     PHYSICAL EXAM  GENERAL EXAM/CONSTITUTIONAL: Vitals:  Vitals:   09/23/23 1157  BP: 122/61  Weight: 102 lb (46.3 kg)  Height: 5\' 1"  (1.549 m)   Body mass index is 19.27 kg/m. Wt Readings from Last 3 Encounters:  09/23/23 102 lb (46.3 kg)  09/22/23 101 lb 9.6 oz (46.1 kg)  06/20/23 105 lb (47.6 kg)   Patient is in no distress; well developed, nourished and groomed; neck is supple  CARDIOVASCULAR: Examination of carotid arteries is normal; no carotid bruits Regular rate and rhythm, no murmurs Examination of peripheral vascular system by observation and palpation is normal  EYES: Ophthalmoscopic exam of optic discs and posterior segments is normal; no papilledema or hemorrhages No results found.  MUSCULOSKELETAL: Gait, strength, tone, movements noted in Neurologic exam below  NEUROLOGIC: MENTAL STATUS:      No data to display            09/23/2023   11:59 AM  Montreal Cognitive Assessment   Visuospatial/ Executive (0/5) 3  Naming (0/3) 2  Attention: Read list of digits (0/2) 2  Attention: Read list of letters (0/1) 1  Attention: Serial 7 subtraction starting at 100 (0/3) 2  Language: Repeat phrase (0/2) 2  Language : Fluency (  0/1) 1  Abstraction (0/2) 2  Delayed Recall (0/5) 3  Orientation (0/6) 5  Total 23    awake, alert, oriented to person, place and time recent and remote memory intact normal attention and concentration language fluent, comprehension intact, naming intact fund of knowledge appropriate  CRANIAL NERVE:  2nd - no papilledema on  fundoscopic exam 2nd, 3rd, 4th, 6th - pupils equal and reactive to light, visual fields full to confrontation, extraocular muscles intact, no nystagmus 5th - facial sensation symmetric 7th - facial strength symmetric 8th - hearing intact 9th - palate elevates symmetrically, uvula midline 11th - shoulder shrug symmetric 12th - tongue protrusion midline  MOTOR:  POSTURAL, ACTION TREMOR IN HEAD AND ARMS normal bulk and tone, full strength in the BUE, BLE  SENSORY:  normal and symmetric to light touch, temperature, vibration  COORDINATION:  finger-nose-finger, fine finger movements normal  REFLEXES:  deep tendon reflexes BRISK and symmetric  GAIT/STATION:  narrow based gait; USING CANE   DIAGNOSTIC DATA (LABS, IMAGING, TESTING) - I reviewed patient records, labs, notes, testing and imaging myself where available.  Lab Results  Component Value Date   WBC 5.5 06/20/2023   HGB 13.0 06/20/2023   HCT 38.6 06/20/2023   MCV 98.0 06/20/2023   PLT 282 06/20/2023      Component Value Date/Time   NA 138 06/20/2023 1528   NA 142 06/14/2022 1107   K 3.8 06/20/2023 1528   CL 102 06/20/2023 1528   CO2 25 06/20/2023 1528   GLUCOSE 129 (H) 06/20/2023 1528   BUN 20 06/20/2023 1528   BUN 34 (H) 06/14/2022 1107   CREATININE 1.02 (H) 06/20/2023 1528   CALCIUM 9.6 06/20/2023 1528   PROT 7.3 02/12/2023 1729   ALBUMIN 4.1 02/12/2023 1729   AST 41 02/12/2023 1729   ALT 26 02/12/2023 1729   ALKPHOS 77 02/12/2023 1729   BILITOT 0.7 02/12/2023 1729   GFRNONAA 55 (L) 06/20/2023 1528   GFRAA 52 (L) 05/27/2018 0322   Lab Results  Component Value Date   CHOL 174 05/21/2018   HDL 71 05/21/2018   LDLCALC 91 05/21/2018   TRIG 59 05/21/2018   CHOLHDL 2.5 05/21/2018   Lab Results  Component Value Date   HGBA1C 5.4 05/21/2018   No results found for: "VITAMINB12" No results found for: "TSH"   06/20/23 CT head [I reviewed images myself and agree with interpretation. -VRP]  - No acute  intracranial abnormality.    ASSESSMENT AND PLAN  83 y.o. year old female here with:  Dx:  1. Mild memory disturbance    PLAN:  MILD MEMORY DISTURBANCE (since ~2020; contributing factors --> vision impairment, hearing impairment, anxiety; MoCA 23/30; elevated pTau217)  - symptoms could be due to mild cognitive impairment vs other contributing factors above; discussed addl testing options (MRI brain, ATN panel, amyloid PET scan) and benefits / risks of amyloid targeting therapies; she will think about it and let me know  - check B12 level  - try to stay active physically and get some exercise (at least 15-30 minutes per day) - eat a nutritious diet with lean protein, plants / vegetables, whole grains; avoid ultra-processed foods - increase social activities, brain stimulation, games, puzzles, hobbies, crafts, arts, music; try new activities; keep it fun! - aim for at least 7-8 hours sleep per night (or more) - avoid smoking and alcohol - caution with medications, finances; not currently driving - safety / supervision issues reviewed  Orders Placed This Encounter  Procedures  Vitamin B12   Return for pending if symptoms worsen or fail to improve.    Suanne Marker, MD 09/23/2023, 12:48 PM Certified in Neurology, Neurophysiology and Neuroimaging  [facility] [address], [address] [place], [place] [address] [phone]

## 2023-09-25 DIAGNOSIS — M546 Pain in thoracic spine: Secondary | ICD-10-CM | POA: Diagnosis not present

## 2023-09-25 DIAGNOSIS — M25559 Pain in unspecified hip: Secondary | ICD-10-CM | POA: Diagnosis not present

## 2023-09-25 DIAGNOSIS — R531 Weakness: Secondary | ICD-10-CM | POA: Diagnosis not present

## 2023-09-25 DIAGNOSIS — M419 Scoliosis, unspecified: Secondary | ICD-10-CM | POA: Diagnosis not present

## 2023-09-29 ENCOUNTER — Other Ambulatory Visit (INDEPENDENT_AMBULATORY_CARE_PROVIDER_SITE_OTHER): Payer: Self-pay

## 2023-09-29 ENCOUNTER — Other Ambulatory Visit: Payer: Self-pay

## 2023-09-29 DIAGNOSIS — I6523 Occlusion and stenosis of bilateral carotid arteries: Secondary | ICD-10-CM

## 2023-09-29 DIAGNOSIS — R413 Other amnesia: Secondary | ICD-10-CM | POA: Diagnosis not present

## 2023-09-29 DIAGNOSIS — Z0289 Encounter for other administrative examinations: Secondary | ICD-10-CM

## 2023-09-30 DIAGNOSIS — M419 Scoliosis, unspecified: Secondary | ICD-10-CM | POA: Diagnosis not present

## 2023-09-30 DIAGNOSIS — M25559 Pain in unspecified hip: Secondary | ICD-10-CM | POA: Diagnosis not present

## 2023-09-30 DIAGNOSIS — M546 Pain in thoracic spine: Secondary | ICD-10-CM | POA: Diagnosis not present

## 2023-09-30 DIAGNOSIS — R531 Weakness: Secondary | ICD-10-CM | POA: Diagnosis not present

## 2023-09-30 LAB — VITAMIN B12: Vitamin B-12: >2000 pg/mL — ABNORMAL HIGH (ref 232–1245)

## 2023-10-02 ENCOUNTER — Encounter: Payer: Self-pay | Admitting: Diagnostic Neuroimaging

## 2023-10-02 DIAGNOSIS — H47011 Ischemic optic neuropathy, right eye: Secondary | ICD-10-CM | POA: Diagnosis not present

## 2023-10-02 DIAGNOSIS — H35371 Puckering of macula, right eye: Secondary | ICD-10-CM | POA: Diagnosis not present

## 2023-10-02 DIAGNOSIS — Z961 Presence of intraocular lens: Secondary | ICD-10-CM | POA: Diagnosis not present

## 2023-10-07 DIAGNOSIS — M546 Pain in thoracic spine: Secondary | ICD-10-CM | POA: Diagnosis not present

## 2023-10-07 DIAGNOSIS — R531 Weakness: Secondary | ICD-10-CM | POA: Diagnosis not present

## 2023-10-07 DIAGNOSIS — M419 Scoliosis, unspecified: Secondary | ICD-10-CM | POA: Diagnosis not present

## 2023-10-07 DIAGNOSIS — M25559 Pain in unspecified hip: Secondary | ICD-10-CM | POA: Diagnosis not present

## 2023-10-08 NOTE — Progress Notes (Deleted)
 Office Note    Requesting Provider:  Rodrigo Ran, MD  HPI: Shannon Berger is a 83 y.o. (1941/05/12) female presenting in follow up s/p L CEA with Dr. Edilia Bo on 2019.     S/P LEFT CAROTID ENDARTERECTOMY: This patient has no evidence of recurrent carotid artery stenosis on the left where she had a carotid endarterectomy.  She has no right carotid stenosis.  She is asymptomatic.  She is on aspirin and is on a statin.  She is not a smoker.  I have recommended that we continue with the yearly carotid duplex scan.  I explained that I will be retiring.  I offered to have her seen on the PA schedule but she would prefer to have a physician.  I will arrange for her to see a physician for her follow-up duplex in 1 year.  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  She is asymptomatic.  She has no postprandial abdominal pain or weight loss.  No further follow-up is needed unless she develops symptoms of mesenteric ischemia.  CHRONIC VENOUS DISEASE: She had some reticular veins that she was concerned about on exam.  We have discussed conservative measures for venous disease.  I explained the importance of trying to avoid prolonged sitting and standing.  We discussed importance of exercise specifically walking and water aerobics.  I encouraged her to consider knee-high compression stockings when she will be standing or traveling.  We discussed importance of leg elevation and the proper positioning for this.  The pt is *** on a statin for cholesterol management.  The pt is *** on a daily aspirin.   Other AC:  *** The pt is *** on medication for hypertension.   The pt is *** diabetic.  Tobacco hx:  ***  Past Medical History:  Diagnosis Date   Anginal pain (HCC)    occasionally   Anxiety    Arthritis    Bladder cancer (HCC)    Carotid arterial disease (HCC)    Chronic kidney disease    Complication of anesthesia     "hallucinates for days; have a hard time waking up; almost couldn't get her awake last time; wake up wild" (05/26/2018)   Constipation    Degeneration of lumbar intervertebral disc    Dislocation of hip joint prosthesis (HCC)    Essential tremor    Fusion of spine of lumbar region    Heart murmur    "slight one" (05/26/2018)   Hyperlipidemia    Hypertension    Low back pain radiating to left lower extremity    Melanoma (HCC)    "center of upper chest"   Osteoarthritis    Osteopenia    Sleep difficulties    Spinal stenosis at L4-L5 level    Stroke [facility])    tia--last week   TIA (transient ischemic attack) 05/20/2018    Past Surgical History:  Procedure Laterality Date   ANTERIOR LUMBAR FUSION     added rods and screws;   BACK SURGERY     BLADDER SURGERY     BREAST BIOPSY Left    "not cancer"   BUNIONECTOMY Left    CAROTID ENDARTERECTOMY Left 05/26/2018   CATARACT EXTRACTION W/ INTRAOCULAR LENS  IMPLANT, BILATERAL Bilateral    CYSTOSCOPY N/A 03/25/2023   Procedure: CYSTOSCOPY;  Surgeon: Rene Paci, MD;  Location: [facility];  Service: Urology;  Laterality: N/A;   ENDARTERECTOMY Left 05/26/2018  Procedure: ENDARTERECTOMY CAROTID LEFT;  Surgeon: Chuck Hint, MD;  Location: [facility] OR;  Service: Vascular;  Laterality: Left;   EYE SURGERY Right    "retina wrinkle OR; now can't see out of that eye" (05/26/2018)   JOINT REPLACEMENT Left 2014   Hip   LUMBAR SPINE HARDWARE REMOVAL     "went in thru the front"   MELANOMA EXCISION     "center of upper chest"   PATCH ANGIOPLASTY Left 05/26/2018   Procedure: PATCH ANGIOPLASTY LEFT CAROTID ARTERY;  Surgeon: Chuck Hint, MD;  Location: [facility] OR;  Service: Vascular;  Laterality: Left;   PILONIDAL CYST DRAINAGE     POSTERIOR LUMBAR FUSION  X 2   TENDON REPAIR Left    Reattachment of tendon- Left hip   THUMB FUSION Right    TONSILLECTOMY AND ADENOIDECTOMY     TOTAL HIP ARTHROPLASTY Left 09/04/2012    Procedure: LEFT TOTAL HIP ARTHROPLASTY ANTERIOR APPROACH;  Surgeon: Kathryne Hitch, MD;  Location: [facility];  Service: Orthopedics;  Laterality: Left;  Left Total Hip Arthroplasty, Anterior Approach   TRANSURETHRAL RESECTION OF BLADDER TUMOR N/A 06/06/2020   Procedure: TRANSURETHRAL RESECTION OF BLADDER TUMOR(TURBT) WITH CYSTOSCOPY/ INSTILLATION OF GEMCITABINE WITH RIGHT STENT PLACEMENT;  Surgeon: Rene Paci, MD;  Location: [facility];  Service: Urology;  Laterality: N/A;   TRANSURETHRAL RESECTION OF BLADDER TUMOR N/A 03/25/2023   Procedure: TRANSURETHRAL RESECTION OF BLADDER TUMOR (TURBT);  Surgeon: Rene Paci, MD;  Location: [facility];  Service: Urology;  Laterality: N/A;  30 MINUTES    Social History   Socioeconomic History   Marital status: Married    Spouse name: jerry   Number of children: 2   Years of education: Not on file   Highest education level: Bachelor's degree (e.g., BA, AB, BS)  Occupational History   Occupation: Research officer, political party: RETIRED    Comment: retired  Tobacco Use   Smoking status: Never   Smokeless tobacco: Never  Vaping Use   Vaping status: Never Used  Substance and Sexual Activity   Alcohol use: Not Currently    Comment: 1 a month   Drug use: Never   Sexual activity: Not Currently    Partners: Male    Birth control/protection: Post-menopausal    Comment: married  Other Topics Concern   Not on file  Social History Narrative   Pt lives at home with spouse jerry, 2 story home has 2 children   She is right handed, she drinks ginger ale and come tea, no coffee, or soda   Social Drivers of Corporate investment banker Strain: Not on file  Food Insecurity: Not on file  Transportation Needs: No Transportation Needs (02/20/2023)   PRAPARE - Administrator, Civil Service (Medical): No    Lack of Transportation (Non-Medical): No  Physical Activity: Not on file  Stress: Not on file  Social Connections: Not  on file  Intimate Partner Violence: Not on file   *** Family History  Problem Relation Age of Onset   Heart failure Mother    Cancer Mother        colon   Hypertension Mother    Other Mother        Varicose Veins   Diabetes Father    Heart disease Father    Hypertension Father    Heart attack Father    Cancer Brother        squamos cell - head, neck, ear  Healthy Son    Migraines Daughter     Current Outpatient Medications  Medication Sig Dispense Refill   acetaminophen (TYLENOL) 500 MG tablet Take 500 mg by mouth every 6 (six) hours as needed for moderate pain or headache.     ALPRAZolam (XANAX) 0.5 MG tablet Take 0.25-0.5 mg by mouth at bedtime as needed for sleep or anxiety.     amLODipine (NORVASC) 5 MG tablet TAKE 1 TABLET(5 MG) BY MOUTH DAILY 90 tablet 2   Ascorbic Acid (VITAMIN C) 1000 MG tablet Take 1,000 mg by mouth every evening.     aspirin EC 325 MG tablet Take 325 mg by mouth daily.     atorvastatin (LIPITOR) 40 MG tablet Take 40 mg by mouth at bedtime.  3   Cyanocobalamin (VITAMIN B-12) 5000 MCG TBDP Take by mouth at bedtime.     denosumab (PROLIA) 60 MG/ML SOSY injection Inject 60 mg into the skin every 6 (six) months.     desvenlafaxine (PRISTIQ) 100 MG 24 hr tablet Take 100 mg by mouth in the morning.     ezetimibe (ZETIA) 10 MG tablet Take 10 mg by mouth at bedtime.     lamoTRIgine (LAMICTAL) 200 MG tablet Take 100 mg by mouth daily.     Multiple Vitamin (MULTIVITAMIN) tablet Take 1 tablet by mouth in the morning.     olmesartan (BENICAR) 40 MG tablet Take 40 mg by mouth in the morning.     REPATHA SURECLICK 140 MG/ML SOAJ Inject 1 Syringe into the skin every 14 (fourteen) days.     tretinoin (RETIN-A) 0.05 % cream Apply 1 Application topically daily as needed (skin blemishes.). Apply a pea sized amount to face     zinc gluconate 50 MG tablet Take 50 mg by mouth at bedtime.     No current facility-administered medications for this visit.    Facility-Administered Medications Ordered in Other Visits  Medication Dose Route Frequency Provider Last Rate Last Admin   gemcitabine (GEMZAR) chemo syringe for bladder instillation 2,000 mg  2,000 mg Bladder Instillation Once Winter, Dorian Furnace, MD        Allergies  Allergen Reactions   Oxycodone-Acetaminophen     hallucinations   Versed [Midazolam] Other (See Comments)    Had trouble waking up    Hydrocodone-Acetaminophen     hallucination      REVIEW OF SYSTEMS:  *** [X]  denotes positive finding, [ ]  denotes negative finding Cardiac  Comments:  Chest pain or chest pressure:    Shortness of breath upon exertion:    Short of breath when lying flat:    Irregular heart rhythm:        Vascular    Pain in calf, thigh, or hip brought on by ambulation:    Pain in feet at night that wakes you up from your sleep:     Blood clot in your veins:    Leg swelling:         Pulmonary    Oxygen at home:    Productive cough:     Wheezing:         Neurologic    Sudden weakness in arms or legs:     Sudden numbness in arms or legs:     Sudden onset of difficulty speaking or slurred speech:    Temporary loss of vision in one eye:     Problems with dizziness:         Gastrointestinal    Blood in stool:  Vomited blood:         Genitourinary    Burning when urinating:     Blood in urine:        Psychiatric    Major depression:         Hematologic    Bleeding problems:    Problems with blood clotting too easily:        Skin    Rashes or ulcers:        Constitutional    Fever or chills:      PHYSICAL EXAMINATION:  There were no vitals filed for this visit.  General:  WDWN in NAD; vital signs documented above Gait: Not observed HENT: WNL, normocephalic Pulmonary: normal non-labored breathing , without wheezing Cardiac: {Desc; regular/irreg:14544} HR Abdomen: soft, NT, no masses Skin: {With/Without:20273} rashes Vascular Exam/Pulses:  Right Left   Radial {Exam; arterial pulse strength 0-4:30167} {Exam; arterial pulse strength 0-4:30167}  Ulnar {Exam; arterial pulse strength 0-4:30167} {Exam; arterial pulse strength 0-4:30167}  Femoral {Exam; arterial pulse strength 0-4:30167} {Exam; arterial pulse strength 0-4:30167}  Popliteal {Exam; arterial pulse strength 0-4:30167} {Exam; arterial pulse strength 0-4:30167}  DP {Exam; arterial pulse strength 0-4:30167} {Exam; arterial pulse strength 0-4:30167}  PT {Exam; arterial pulse strength 0-4:30167} {Exam; arterial pulse strength 0-4:30167}   Extremities: {With/Without:20273} ischemic changes, {With/Without:20273} Gangrene , {With/Without:20273} cellulitis; {With/Without:20273} open wounds;  Musculoskeletal: no muscle wasting or atrophy  Neurologic: A&O X 3;  No focal weakness or paresthesias are detected Psychiatric:  The pt has {Desc; normal/abnormal:11317::"Normal"} affect.   Non-Invasive Vascular Imaging:   ***    ASSESSMENT/PLAN: Shannon Berger is a 83 y.o. female presenting with ***   ***   Victorino Sparrow, MD [facility] [phone]

## 2023-10-09 ENCOUNTER — Telehealth (HOSPITAL_COMMUNITY): Payer: Self-pay

## 2023-10-09 ENCOUNTER — Ambulatory Visit (HOSPITAL_COMMUNITY): Payer: Medicare Other

## 2023-10-09 ENCOUNTER — Ambulatory Visit: Payer: Medicare Other | Admitting: Vascular Surgery

## 2023-10-09 DIAGNOSIS — M419 Scoliosis, unspecified: Secondary | ICD-10-CM | POA: Diagnosis not present

## 2023-10-09 DIAGNOSIS — R531 Weakness: Secondary | ICD-10-CM | POA: Diagnosis not present

## 2023-10-09 DIAGNOSIS — M546 Pain in thoracic spine: Secondary | ICD-10-CM | POA: Diagnosis not present

## 2023-10-09 DIAGNOSIS — M25559 Pain in unspecified hip: Secondary | ICD-10-CM | POA: Diagnosis not present

## 2023-10-09 NOTE — Telephone Encounter (Signed)
 Attempted to contact the patient to reschedule [facility] [place].  No answer.  Left message.  Provided direct contact number for scheduling: [phone].  Appointment needs to be rescheduled due to tech availability.

## 2023-10-14 DIAGNOSIS — F3341 Major depressive disorder, recurrent, in partial remission: Secondary | ICD-10-CM | POA: Diagnosis not present

## 2023-10-14 DIAGNOSIS — F411 Generalized anxiety disorder: Secondary | ICD-10-CM | POA: Diagnosis not present

## 2023-10-15 ENCOUNTER — Ambulatory Visit: Admitting: Podiatry

## 2023-10-15 ENCOUNTER — Ambulatory Visit (INDEPENDENT_AMBULATORY_CARE_PROVIDER_SITE_OTHER)

## 2023-10-15 DIAGNOSIS — M2041 Other hammer toe(s) (acquired), right foot: Secondary | ICD-10-CM | POA: Diagnosis not present

## 2023-10-15 DIAGNOSIS — M79671 Pain in right foot: Secondary | ICD-10-CM

## 2023-10-15 DIAGNOSIS — M2011 Hallux valgus (acquired), right foot: Secondary | ICD-10-CM

## 2023-10-15 DIAGNOSIS — M7751 Other enthesopathy of right foot: Secondary | ICD-10-CM

## 2023-10-15 MED ORDER — BETAMETHASONE SOD PHOS & ACET 6 (3-3) MG/ML IJ SUSP
3.0000 mg | Freq: Once | INTRAMUSCULAR | Status: AC
  Administered 2023-10-15: 3 mg via INTRA_ARTICULAR

## 2023-10-15 NOTE — Progress Notes (Signed)
 Chief Complaint  Patient presents with   Hammer Toe    RM#8 Est Growth between great toe and next toe on right foot/Hammertoe/ Callouses     HPI: 83 y.o. female presenting today for evaluation of pain and tenderness associated to the right forefoot.  Ongoing for several years.  She developed symptomatic calluses.  She also has a history of bunion and hammertoe deformity.  Past Medical History:  Diagnosis Date   Anginal pain (HCC)    occasionally   Anxiety    Arthritis    Bladder cancer (HCC)    Carotid arterial disease (HCC)    Chronic kidney disease    Complication of anesthesia    "hallucinates for days; have a hard time waking up; almost couldn't get her awake last time; wake up wild" (05/26/2018)   Constipation    Degeneration of lumbar intervertebral disc    Dislocation of hip joint prosthesis (HCC)    Essential tremor    Fusion of spine of lumbar region    Heart murmur    "slight one" (05/26/2018)   Hyperlipidemia    Hypertension    Low back pain radiating to left lower extremity    Melanoma (HCC)    "center of upper chest"   Osteoarthritis    Osteopenia    Sleep difficulties    Spinal stenosis at L4-L5 level    Stroke [facility])    tia--last week   TIA (transient ischemic attack) 05/20/2018    Past Surgical History:  Procedure Laterality Date   ANTERIOR LUMBAR FUSION     added rods and screws;   BACK SURGERY     BLADDER SURGERY     BREAST BIOPSY Left    "not cancer"   BUNIONECTOMY Left    CAROTID ENDARTERECTOMY Left 05/26/2018   CATARACT EXTRACTION W/ INTRAOCULAR LENS  IMPLANT, BILATERAL Bilateral    CYSTOSCOPY N/A 03/25/2023   Procedure: CYSTOSCOPY;  Surgeon: Rene Paci, MD;  Location: [facility];  Service: Urology;  Laterality: N/A;   ENDARTERECTOMY Left 05/26/2018   Procedure: ENDARTERECTOMY CAROTID LEFT;  Surgeon: Chuck Hint, MD;  Location: [facility] OR;  Service: Vascular;  Laterality: Left;   EYE SURGERY Right    "retina wrinkle OR;  now can't see out of that eye" (05/26/2018)   JOINT REPLACEMENT Left 2014   Hip   LUMBAR SPINE HARDWARE REMOVAL     "went in thru the front"   MELANOMA EXCISION     "center of upper chest"   PATCH ANGIOPLASTY Left 05/26/2018   Procedure: PATCH ANGIOPLASTY LEFT CAROTID ARTERY;  Surgeon: Chuck Hint, MD;  Location[facility];  Service: Vascular;  Laterality: Left;   PILONIDAL CYST DRAINAGE     POSTERIOR LUMBAR FUSION  X 2   TENDON REPAIR Left    Reattachment of tendon- Left hip   THUMB FUSION Right    TONSILLECTOMY AND ADENOIDECTOMY     TOTAL HIP ARTHROPLASTY Left 09/04/2012   Procedure: LEFT TOTAL HIP ARTHROPLASTY ANTERIOR APPROACH;  Surgeon: Kathryne Hitch, MD;  Location: [facility];  Service: Orthopedics;  Laterality: Left;  Left Total Hip Arthroplasty, Anterior Approach   TRANSURETHRAL RESECTION OF BLADDER TUMOR N/A 06/06/2020   Procedure: TRANSURETHRAL RESECTION OF BLADDER TUMOR(TURBT) WITH CYSTOSCOPY/ INSTILLATION OF GEMCITABINE WITH RIGHT STENT PLACEMENT;  Surgeon: Rene Paci, MD;  Location: [facility];  Service: Urology;  Laterality: N/A;   TRANSURETHRAL RESECTION OF BLADDER TUMOR N/A 03/25/2023   Procedure: TRANSURETHRAL RESECTION OF BLADDER TUMOR (TURBT);  Surgeon:  Rene Paci, MD;  Location: [facility];  Service: Urology;  Laterality: N/A;  30 MINUTES    Allergies  Allergen Reactions   Oxycodone-Acetaminophen     hallucinations   Versed [Midazolam] Other (See Comments)    Had trouble waking up    Hydrocodone-Acetaminophen     hallucination      Physical Exam: General: The patient is alert and oriented x3 in no acute distress.  Dermatology: Skin is warm, dry and supple bilateral lower extremities.   Vascular: Palpable pedal pulses bilaterally. Capillary refill within normal limits.  No appreciable edema.  No erythema.  Neurological: Grossly intact via light touch  Musculoskeletal Exam: Deformity noted with tailor's bunion as well and  hammertoe contracture to the lesser digits bilateral.  Dorsal subluxation of the second digit at the level of the MTP.  There is also tenderness with palpation and range of motion of the IPJ of the great toe  Radiographic Exam RT foot 10/15/2023:  Internal screw fixation noted to the first metatarsal head of the foot.  Degenerative changes noted diffusely throughout the foot with deviation of the toes at the level of the MTP.  Assessment/Plan of Care: 1.  Hallux valgus right 2.  Hammertoes lesser digits right 3.  Fat pad atrophy with chronic metatarsalgia 4.  Capsulitis right great toe  -Patient evaluated.  X-rays reviewed -Injection of 0.5 cc Celestone Soluspan injected into the IPJ right great toe -Stressed the importance of wearing good supportive tennis shoes and sneakers with arch supports to offload pressure from the forefoot.  Recommended [employer] running store -Today we discussed in detail bunion surgery as well as hammertoe corrective surgery.  Risk benefits advantages and disadvantages of the procedure were explained in detail.  She has a history of prior bunion surgery and she says it was excruciating and very painful.  For now we are going to pursue conservative treatment -Return to clinic as needed  *They have a grandson who will be playing college football this fall 2025      Felecia Shelling, DPM [facility]  Dr. Felecia Shelling, DPM    [address] [place], [place] [address]                Office [phone]  Fax [phone]

## 2023-10-16 DIAGNOSIS — R69 Illness, unspecified: Secondary | ICD-10-CM | POA: Diagnosis not present

## 2023-10-17 ENCOUNTER — Other Ambulatory Visit: Payer: Self-pay

## 2023-10-17 DIAGNOSIS — M7751 Other enthesopathy of right foot: Secondary | ICD-10-CM

## 2023-10-17 DIAGNOSIS — M2041 Other hammer toe(s) (acquired), right foot: Secondary | ICD-10-CM

## 2023-10-17 DIAGNOSIS — M21611 Bunion of right foot: Secondary | ICD-10-CM

## 2023-10-17 DIAGNOSIS — M2011 Hallux valgus (acquired), right foot: Secondary | ICD-10-CM

## 2023-10-20 DIAGNOSIS — M419 Scoliosis, unspecified: Secondary | ICD-10-CM | POA: Diagnosis not present

## 2023-10-20 DIAGNOSIS — M25559 Pain in unspecified hip: Secondary | ICD-10-CM | POA: Diagnosis not present

## 2023-10-20 DIAGNOSIS — M546 Pain in thoracic spine: Secondary | ICD-10-CM | POA: Diagnosis not present

## 2023-10-20 DIAGNOSIS — R531 Weakness: Secondary | ICD-10-CM | POA: Diagnosis not present

## 2023-10-23 DIAGNOSIS — L821 Other seborrheic keratosis: Secondary | ICD-10-CM | POA: Diagnosis not present

## 2023-10-23 DIAGNOSIS — M419 Scoliosis, unspecified: Secondary | ICD-10-CM | POA: Diagnosis not present

## 2023-10-23 DIAGNOSIS — Z85828 Personal history of other malignant neoplasm of skin: Secondary | ICD-10-CM | POA: Diagnosis not present

## 2023-10-23 DIAGNOSIS — R531 Weakness: Secondary | ICD-10-CM | POA: Diagnosis not present

## 2023-10-23 DIAGNOSIS — M546 Pain in thoracic spine: Secondary | ICD-10-CM | POA: Diagnosis not present

## 2023-10-23 DIAGNOSIS — M25559 Pain in unspecified hip: Secondary | ICD-10-CM | POA: Diagnosis not present

## 2023-10-28 DIAGNOSIS — M546 Pain in thoracic spine: Secondary | ICD-10-CM | POA: Diagnosis not present

## 2023-10-28 DIAGNOSIS — M25559 Pain in unspecified hip: Secondary | ICD-10-CM | POA: Diagnosis not present

## 2023-10-28 DIAGNOSIS — R531 Weakness: Secondary | ICD-10-CM | POA: Diagnosis not present

## 2023-10-28 DIAGNOSIS — M419 Scoliosis, unspecified: Secondary | ICD-10-CM | POA: Diagnosis not present

## 2023-10-30 DIAGNOSIS — M546 Pain in thoracic spine: Secondary | ICD-10-CM | POA: Diagnosis not present

## 2023-10-30 DIAGNOSIS — M419 Scoliosis, unspecified: Secondary | ICD-10-CM | POA: Diagnosis not present

## 2023-10-30 DIAGNOSIS — R531 Weakness: Secondary | ICD-10-CM | POA: Diagnosis not present

## 2023-10-30 DIAGNOSIS — M25559 Pain in unspecified hip: Secondary | ICD-10-CM | POA: Diagnosis not present

## 2023-11-03 NOTE — Progress Notes (Signed)
 Office Note    Requesting Provider:  Aldo Hun, MD  HPI: Shannon Berger is a 83 y.o. ([birth year]) female presenting in follow up s/p L CEA with Dr. Shaunna Delaware on 2019.  Patient also with known, asymptomatic, celiac artery and SMA stenosis, as well as chronic venous disease bilaterally.  On exam today, Shannon Berger was doing well, accompanied by her husband.  A native of [place], she graduated from [employer] in [place].  She worked as a Runner, broadcasting/film/video by trade, and her first position was at [employer].  She has 2 children, and 4 grandchildren.  Her grandchildren are in their 53s.  The youngest is a Land at [employer] and will be getting married soon.  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  Over the last year, married has had no issues.  Denies TIA, stroke, amaurosis.  Had an episode of weakness in December which sent her to the emergency department.  Since that time no issues.  She has had no significant weight gain or weight loss.  States she is not as hungry as she used to be.  Denies postprandial pain, food fear. No issues in the lower extremities.  Denies claudication, ischemic rest pain, tissue loss.  Ulcerations   The pt is  on a statin for cholesterol management.  The pt is  on a daily aspirin .    Past Medical History:  Diagnosis Date   Anginal pain (HCC)    occasionally   Anxiety    Arthritis    Bladder cancer (HCC)    Carotid arterial disease (HCC)    Chronic kidney disease    Complication of anesthesia    "hallucinates for days; have a hard time waking up; almost couldn't get her awake last time; wake up wild" (05/26/2018)   Constipation    Degeneration of lumbar intervertebral disc    Dislocation of hip joint prosthesis (HCC)    Essential tremor    Fusion of spine of lumbar region    Heart murmur    "slight one" (05/26/2018)   Hyperlipidemia    Hypertension    Low back pain radiating to left lower  extremity    Melanoma (HCC)    "center of upper chest"   Osteoarthritis    Osteopenia    Sleep difficulties    Spinal stenosis at L4-L5 level    Stroke [facility])    tia--last week   TIA (transient ischemic attack) 05/20/2018    Past Surgical History:  Procedure Laterality Date   ANTERIOR LUMBAR FUSION     added rods and screws;   BACK SURGERY     BLADDER SURGERY     BREAST BIOPSY Left    "not cancer"   BUNIONECTOMY Left    CAROTID ENDARTERECTOMY Left 05/26/2018   CATARACT EXTRACTION W/ INTRAOCULAR LENS  IMPLANT, BILATERAL Bilateral    CYSTOSCOPY N/A 03/25/2023   Procedure: CYSTOSCOPY;  Surgeon: Adelbert Homans, MD;  [facility];  Service: Urology;  Laterality: N/A;   ENDARTERECTOMY Left 05/26/2018   Procedure: ENDARTERECTOMY CAROTID LEFT;  Surgeon: Dannis Dy, MD;  Location: [facility] OR;  Service: Vascular;  Laterality: Left;   EYE SURGERY Right    "retina wrinkle OR; now can't see out of that eye" (05/26/2018)   JOINT REPLACEMENT Left 2014   Hip   LUMBAR SPINE HARDWARE REMOVAL     "went in thru the front"   MELANOMA EXCISION     "center of upper chest"   [facility]  ANGIOPLASTY Left 05/26/2018   Procedure: PATCH ANGIOPLASTY LEFT CAROTID ARTERY;  Surgeon: Dannis Dy, MD;  Location: [facility] OR;  Service: Vascular;  Laterality: Left;   PILONIDAL CYST DRAINAGE     POSTERIOR LUMBAR FUSION  X 2   TENDON REPAIR Left    Reattachment of tendon- Left hip   THUMB FUSION Right    TONSILLECTOMY AND ADENOIDECTOMY     TOTAL HIP ARTHROPLASTY Left 09/04/2012   Procedure: LEFT TOTAL HIP ARTHROPLASTY ANTERIOR APPROACH;  Surgeon: Arnie Lao, MD;  Location: [facility];  Service: Orthopedics;  Laterality: Left;  Left Total Hip Arthroplasty, Anterior Approach   TRANSURETHRAL RESECTION OF BLADDER TUMOR N/A 06/06/2020   Procedure: TRANSURETHRAL RESECTION OF BLADDER TUMOR(TURBT) WITH CYSTOSCOPY/ INSTILLATION OF GEMCITABINE  WITH RIGHT STENT PLACEMENT;  Surgeon: Adelbert Homans, MD;  Location: [facility];  Service: Urology;  Laterality: N/A;   TRANSURETHRAL RESECTION OF BLADDER TUMOR N/A 03/25/2023   Procedure: TRANSURETHRAL RESECTION OF BLADDER TUMOR (TURBT);  Surgeon: Adelbert Homans, MD;  Location: [facility];  Service: Urology;  Laterality: N/A;  30 MINUTES    Social History   Socioeconomic History   Marital status: Married    Spouse name: jerry   Number of children: 2   Years of education: Not on file   Highest education level: Bachelor's degree (e.g., BA, AB, BS)  Occupational History   Occupation: Research officer, political party: RETIRED    Comment: retired  Tobacco Use   Smoking status: Never   Smokeless tobacco: Never  Vaping Use   Vaping status: Never Used  Substance and Sexual Activity   Alcohol use: Not Currently    Comment: 1 a month   Drug use: Never   Sexual activity: Not Currently    Partners: Male    Birth control/protection: Post-menopausal    Comment: married  Other Topics Concern   Not on file  Social History Narrative   Pt lives at home with spouse jerry, 2 story home has 2 children   She is right handed, she drinks ginger ale and come tea, no coffee, or soda   Social Drivers of Corporate investment banker Strain: Not on file  Food Insecurity: Not on file  Transportation Needs: No Transportation Needs (02/20/2023)   PRAPARE - Administrator, Civil Service (Medical): No    Lack of Transportation (Non-Medical): No  Physical Activity: Not on file  Stress: Not on file  Social Connections: Not on file  Intimate Partner Violence: Not on file   Family History  Problem Relation Age of Onset   Heart failure Mother    Cancer Mother        colon   Hypertension Mother    Other Mother        Varicose Veins   Diabetes Father    Heart disease Father    Hypertension Father    Heart attack Father    Cancer Brother        squamos cell - head, neck, ear   Healthy Son    Migraines Daughter      Current Outpatient Medications  Medication Sig Dispense Refill   acetaminophen  (TYLENOL ) 500 MG tablet Take 500 mg by mouth every 6 (six) hours as needed for moderate pain or headache.     ALPRAZolam  (XANAX ) 0.5 MG tablet Take 0.25-0.5 mg by mouth at bedtime as needed for sleep or anxiety.     amLODipine  (NORVASC ) 5 MG tablet TAKE 1 TABLET(5 MG) BY  MOUTH DAILY 90 tablet 2   Ascorbic Acid  (VITAMIN C ) 1000 MG tablet Take 1,000 mg by mouth every evening.     aspirin  EC 325 MG tablet Take 325 mg by mouth daily.     atorvastatin  (LIPITOR) 40 MG tablet Take 40 mg by mouth at bedtime.  3   Cyanocobalamin  (VITAMIN B-12) 5000 MCG TBDP Take by mouth at bedtime.     denosumab  (PROLIA ) 60 MG/ML SOSY injection Inject 60 mg into the skin every 6 (six) months.     desvenlafaxine (PRISTIQ) 100 MG 24 hr tablet Take 100 mg by mouth in the morning.     ezetimibe  (ZETIA ) 10 MG tablet Take 10 mg by mouth at bedtime.     lamoTRIgine (LAMICTAL) 200 MG tablet Take 100 mg by mouth daily.     Multiple Vitamin (MULTIVITAMIN) tablet Take 1 tablet by mouth in the morning.     olmesartan (BENICAR) 40 MG tablet Take 40 mg by mouth in the morning.     REPATHA SURECLICK 140 MG/ML SOAJ Inject 1 Syringe into the skin every 14 (fourteen) days.     tretinoin (RETIN-A) 0.05 % cream Apply 1 Application topically daily as needed (skin blemishes.). Apply a pea sized amount to face     zinc gluconate 50 MG tablet Take 50 mg by mouth at bedtime.     No current facility-administered medications for this visit.   Facility-Administered Medications Ordered in Other Visits  Medication Dose Route Frequency Provider Last Rate Last Admin   gemcitabine  (GEMZAR ) chemo syringe for bladder instillation 2,000 mg  2,000 mg Bladder Instillation Once Winter, Rhodia Cera, MD        Allergies  Allergen Reactions   Oxycodone -Acetaminophen      hallucinations   Versed  [Midazolam ] Other (See Comments)    Had trouble waking up     Hydrocodone -Acetaminophen      hallucination      REVIEW OF SYSTEMS:  [X]  denotes positive finding, [ ]  denotes negative finding Cardiac  Comments:  Chest pain or chest pressure:    Shortness of breath upon exertion:    Short of breath when lying flat:    Irregular heart rhythm:        Vascular    Pain in calf, thigh, or hip brought on by ambulation:    Pain in feet at night that wakes you up from your sleep:     Blood clot in your veins:    Leg swelling:         Pulmonary    Oxygen at home:    Productive cough:     Wheezing:         Neurologic    Sudden weakness in arms or legs:     Sudden numbness in arms or legs:     Sudden onset of difficulty speaking or slurred speech:    Temporary loss of vision in one eye:     Problems with dizziness:         Gastrointestinal    Blood in stool:     Vomited blood:         Genitourinary    Burning when urinating:     Blood in urine:        Psychiatric    Major depression:         Hematologic    Bleeding problems:    Problems with blood clotting too easily:        Skin    Rashes or ulcers:  Constitutional    Fever or chills:      PHYSICAL EXAMINATION:  There were no vitals filed for this visit.  General:  WDWN in NAD; vital signs documented above Gait: Not observed HENT: WNL, normocephalic Pulmonary: normal non-labored breathing , without wheezing Cardiac: regular HR Abdomen: soft, NT, no masses Skin: without rashes Vascular Exam/Pulses:  Right Left  Radial 2+ (normal) 2+ (normal)  Ulnar    Femoral    Popliteal    DP 2+ (normal) 2+ (normal)  PT     Extremities: without ischemic changes, without Gangrene , without cellulitis; without open wounds;  Musculoskeletal: no muscle wasting or atrophy  Neurologic: A&O X 3;  No focal weakness or paresthesias are detected Psychiatric:  The pt has Normal affect.   Non-Invasive Vascular Imaging:     Right Carotid Findings:   +----------+--------+--------+--------+------------------+--------+           PSV cm/sEDV cm/sStenosisPlaque DescriptionComments  +----------+--------+--------+--------+------------------+--------+  CCA Prox  99      12                                          +----------+--------+--------+--------+------------------+--------+  CCA Mid   93      17                                          +----------+--------+--------+--------+------------------+--------+  CCA Distal81      12                                          +----------+--------+--------+--------+------------------+--------+  ICA Prox  76      20      1-39%   heterogenous                +----------+--------+--------+--------+------------------+--------+  ICA Mid   97      22                                          +----------+--------+--------+--------+------------------+--------+  ICA Distal104     22                                          +----------+--------+--------+--------+------------------+--------+  ECA      51      0                                           +----------+--------+--------+--------+------------------+--------+   +----------+--------+-------+----------------+-------------------+           PSV cm/sEDV cmsDescribe        Arm Pressure (mmHG)  +----------+--------+-------+----------------+-------------------+  [id number]     0      Multiphasic, [id number]                  +----------+--------+-------+----------------+-------------------+   +---------+--------+---+--------+--+---------+  VertebralPSV cm/s101EDV cm/s14Antegrade  +---------+--------+---+--------+--+---------+      Left Carotid Findings:  +----------+--------+--------+--------+------------------+--------+  PSV cm/sEDV cm/sStenosisPlaque DescriptionComments  +----------+--------+--------+--------+------------------+--------+  CCA Prox  96      16                                           +----------+--------+--------+--------+------------------+--------+  CCA Mid   82      16                                          +----------+--------+--------+--------+------------------+--------+  CCA Distal91      15              smooth                      +----------+--------+--------+--------+------------------+--------+  ICA Prox  57      9       1-39%   homogeneous                 +----------+--------+--------+--------+------------------+--------+  ICA Mid   73      17                                          +----------+--------+--------+--------+------------------+--------+  ICA Distal110     26                                          +----------+--------+--------+--------+------------------+--------+  ECA      70      12                                          +----------+--------+--------+--------+------------------+--------+   +----------+--------+--------+----------------+-------------------+           PSV cm/sEDV cm/sDescribe        Arm Pressure (mmHG)  +----------+--------+--------+----------------+-------------------+  [id number]     0       Multiphasic, [id number]                  +----------+--------+--------+----------------+-------------------+   +---------+--------+--+--------+--+---------+  VertebralPSV cm/s64EDV cm/s18Antegrade  +---------+--------+--+--------+--+---------+      ASSESSMENT/PLAN: VIANY KAMAI is a 83 y.o. female presenting status post 2019 left CEA with Dr. Shaunna Delaware.  On exam, she remains asymptomatic.  Carotid duplex ultrasound demonstrates no significant changes.  She remains widely patent bilaterally. My plan is to follow this on a yearly basis.  On my review of prior notes, asymptomatic SMA and celiac stenosis were present.  No further imaging is needed as she remains asymptomatic.  Prior notes also noted venous insufficiency.   No significant heaviness or symptoms.    Heidi Llamas and I had a nice discussion regarding the above.  My plan is to see her on a yearly basis for follow-up.  I asked that she continue her current medication regimen and call me should any questions or concerns arise.  Kayla Part, MD [facility] [phone] Total time of patient care including pre-visit research, consultation, and documentation greater than 30 minutes

## 2023-11-05 ENCOUNTER — Ambulatory Visit (HOSPITAL_COMMUNITY)
Admission: RE | Admit: 2023-11-05 | Discharge: 2023-11-05 | Disposition: A | Discharge status: Home / Self Care | Source: Ambulatory Visit | Attending: Vascular Surgery | Admitting: Vascular Surgery

## 2023-11-05 ENCOUNTER — Ambulatory Visit (HOSPITAL_COMMUNITY)

## 2023-11-05 DIAGNOSIS — I6523 Occlusion and stenosis of bilateral carotid arteries: Secondary | ICD-10-CM | POA: Insufficient documentation

## 2023-11-06 ENCOUNTER — Ambulatory Visit: Attending: Vascular Surgery | Admitting: Vascular Surgery

## 2023-11-06 ENCOUNTER — Encounter: Payer: Self-pay | Admitting: Vascular Surgery

## 2023-11-06 VITALS — BP 124/70 | HR 70 | Temp 98.0°F | Resp 18 | Ht 61.0 in | Wt 102.7 lb

## 2023-11-06 DIAGNOSIS — I774 Celiac artery compression syndrome: Secondary | ICD-10-CM

## 2023-11-06 DIAGNOSIS — K551 Chronic vascular disorders of intestine: Secondary | ICD-10-CM

## 2023-11-06 DIAGNOSIS — I6523 Occlusion and stenosis of bilateral carotid arteries: Secondary | ICD-10-CM

## 2023-11-25 DIAGNOSIS — N189 Chronic kidney disease, unspecified: Secondary | ICD-10-CM | POA: Diagnosis not present

## 2023-11-25 DIAGNOSIS — H47011 Ischemic optic neuropathy, right eye: Secondary | ICD-10-CM | POA: Diagnosis not present

## 2023-11-25 DIAGNOSIS — I129 Hypertensive chronic kidney disease with stage 1 through stage 4 chronic kidney disease, or unspecified chronic kidney disease: Secondary | ICD-10-CM | POA: Diagnosis not present

## 2023-11-25 DIAGNOSIS — H35371 Puckering of macula, right eye: Secondary | ICD-10-CM | POA: Diagnosis not present

## 2023-11-25 DIAGNOSIS — Z8673 Personal history of transient ischemic attack (TIA), and cerebral infarction without residual deficits: Secondary | ICD-10-CM | POA: Diagnosis not present

## 2023-11-25 DIAGNOSIS — Z961 Presence of intraocular lens: Secondary | ICD-10-CM | POA: Diagnosis not present

## 2023-11-25 DIAGNOSIS — E785 Hyperlipidemia, unspecified: Secondary | ICD-10-CM | POA: Diagnosis not present

## 2023-11-25 DIAGNOSIS — H40012 Open angle with borderline findings, low risk, left eye: Secondary | ICD-10-CM | POA: Diagnosis not present

## 2023-11-25 DIAGNOSIS — F0390 Unspecified dementia without behavioral disturbance: Secondary | ICD-10-CM | POA: Diagnosis not present

## 2023-11-25 DIAGNOSIS — H539 Unspecified visual disturbance: Secondary | ICD-10-CM | POA: Diagnosis not present

## 2023-11-25 DIAGNOSIS — H538 Other visual disturbances: Secondary | ICD-10-CM | POA: Diagnosis not present

## 2023-11-26 DIAGNOSIS — Z136 Encounter for screening for cardiovascular disorders: Secondary | ICD-10-CM | POA: Diagnosis not present

## 2023-11-27 DIAGNOSIS — K080 Exfoliation of teeth due to systemic causes: Secondary | ICD-10-CM | POA: Diagnosis not present

## 2023-12-04 ENCOUNTER — Telehealth: Payer: Self-pay | Admitting: Diagnostic Neuroimaging

## 2023-12-04 DIAGNOSIS — Z8551 Personal history of malignant neoplasm of bladder: Secondary | ICD-10-CM | POA: Diagnosis not present

## 2023-12-04 NOTE — Telephone Encounter (Signed)
 Pt called stating  pt is  seeing pink splatters  in eye went to see ophthalmologist about  her eye  was told they didn't see anything wrong .  Pt went to urgent care . Urgent care ran test and did cat scan and informed pt to follow up with Neurologist . Pt states vision is still pinkish and is requesting earlier appt  [phone]

## 2023-12-04 NOTE — Telephone Encounter (Signed)
 Cld Pt. No answer, LVM for call back.

## 2023-12-05 NOTE — Telephone Encounter (Signed)
 Spoke w/Pt regarding eye issues. Pt stated she has seen her ophthalmologist recently and they did not see anything eye related. Went to Urgent Care vs ED and had CT scan which ruled out issues with eyes and told her to f/u with neurology. Pt has upcoming appt 02/05/24 was able to get her appt 01/01/24 and add her to the cancellation list. Pt accepted and will watch for cancellations. Encouraged Pt to contact ophthalmologist and or PCP to see if they will order MRI as ophthalmologist had recommended ED. Also advised Pt if splotches worse or she loses vision to go to ED. Pt stated she has no vision in Right eye since about 7 or 8 years ago, as this occurred after surgery. Pt states now she has only about 3/4 vision in Left eye so she is concerned. Again discussed going to ED or calling PCP to have MRI ordered as it will give a better picture and unless urgent and needs the ED, it will give Dr. Salli Crawley something to reference at her appt in June or earlier if cancellation. Pt voiced understanding and thankful for the call.

## 2023-12-05 NOTE — Telephone Encounter (Signed)
 Agree with recommendations. Follow up with ophthal or PCP. Consider ER eval. Will also try to get patient in sooner next week. -VRP

## 2023-12-08 ENCOUNTER — Other Ambulatory Visit: Payer: Self-pay | Admitting: Cardiology

## 2023-12-08 NOTE — Telephone Encounter (Signed)
 Attempted to call Pt. No answer, LVM for call back.

## 2023-12-09 DIAGNOSIS — H539 Unspecified visual disturbance: Secondary | ICD-10-CM | POA: Diagnosis not present

## 2023-12-09 NOTE — Telephone Encounter (Signed)
 Spoke w/Pt regarding Dr. Elon Hakim response to her inquiry. Pt stated her PCP ordered MRI which she is having today. Informed Pt it is our goal to attempt to get her in next week and reminded her to also watch [facility] alerts for cancellations so she can be seen prior to appt on 6/26. Pt voiced understanding and thankful for the call back.

## 2023-12-10 ENCOUNTER — Telehealth: Payer: Self-pay | Admitting: Neurology

## 2023-12-10 NOTE — Telephone Encounter (Signed)
 Received a impression report from where the pt completed MRI brain through [facility] on 12/09/2023.  Will send the information to Dr Salli Crawley to review

## 2023-12-12 DIAGNOSIS — M40204 Unspecified kyphosis, thoracic region: Secondary | ICD-10-CM | POA: Diagnosis not present

## 2023-12-12 DIAGNOSIS — Z681 Body mass index (BMI) 19 or less, adult: Secondary | ICD-10-CM | POA: Diagnosis not present

## 2023-12-15 ENCOUNTER — Other Ambulatory Visit: Payer: Self-pay | Admitting: Neurosurgery

## 2023-12-15 ENCOUNTER — Other Ambulatory Visit (HOSPITAL_COMMUNITY): Payer: Self-pay | Admitting: *Deleted

## 2023-12-15 DIAGNOSIS — M40204 Unspecified kyphosis, thoracic region: Secondary | ICD-10-CM

## 2023-12-16 ENCOUNTER — Ambulatory Visit (HOSPITAL_COMMUNITY)
Admission: RE | Admit: 2023-12-16 | Discharge: 2023-12-16 | Disposition: A | Discharge status: Home / Self Care | Source: Ambulatory Visit | Attending: Internal Medicine | Admitting: Internal Medicine

## 2023-12-16 DIAGNOSIS — M810 Age-related osteoporosis without current pathological fracture: Secondary | ICD-10-CM | POA: Insufficient documentation

## 2023-12-16 MED ORDER — DENOSUMAB 60 MG/ML ~~LOC~~ SOSY
60.0000 mg | PREFILLED_SYRINGE | SUBCUTANEOUS | Status: DC
  Administered 2023-12-16: 60 mg via SUBCUTANEOUS

## 2023-12-16 MED ORDER — DENOSUMAB 60 MG/ML ~~LOC~~ SOSY
PREFILLED_SYRINGE | SUBCUTANEOUS | Status: AC
  Filled 2023-12-16: qty 1

## 2023-12-17 ENCOUNTER — Encounter: Payer: Self-pay | Admitting: Neurosurgery

## 2023-12-18 ENCOUNTER — Telehealth: Payer: Self-pay

## 2023-12-18 NOTE — Telephone Encounter (Signed)
 Auth Submission: APPROVED Site of care: Site of care: [facility] Payer: BCBS medicare Medication & CPT/J Code(s) submitted: Prolia  (Denosumab ) [id number] Route of submission (phone, fax, portal): fax Phone # Fax # Auth type: Buy/Bill PB Units/visits requested: 60mg  x 2 doses Reference number: [id number] Approval from: 12/18/23 to 12/16/24

## 2023-12-22 DIAGNOSIS — H40012 Open angle with borderline findings, low risk, left eye: Secondary | ICD-10-CM | POA: Diagnosis not present

## 2023-12-26 ENCOUNTER — Telehealth: Payer: Self-pay | Admitting: Cardiology

## 2023-12-26 NOTE — Telephone Encounter (Signed)
 Pt c/o medication issue:  1. Name of Medication: Amlodipine   2. How are you currently taking this medication (dosage and times per day)?   3. Are you having a reaction (difficulty breathing--STAT)?   4. What is your medication issue? Wants to know what is correct dose of this medicine

## 2023-12-26 NOTE — Telephone Encounter (Signed)
 Spoke to patient she was calling to verify Amlodipine  mg.Advised she is suppose to take Amlodipine  5 mg daily.Stated she recently had filled and she was suppose to have 90 tablets,but she only received 30 tablets.She will call her pharmacy.

## 2023-12-29 ENCOUNTER — Ambulatory Visit
Admission: RE | Admit: 2023-12-29 | Discharge: 2023-12-29 | Disposition: A | Discharge status: Home / Self Care | Source: Ambulatory Visit | Attending: Neurosurgery | Admitting: Neurosurgery

## 2023-12-29 DIAGNOSIS — M40204 Unspecified kyphosis, thoracic region: Secondary | ICD-10-CM

## 2023-12-29 DIAGNOSIS — M5144 Schmorl's nodes, thoracic region: Secondary | ICD-10-CM | POA: Diagnosis not present

## 2023-12-29 DIAGNOSIS — M4854XD Collapsed vertebra, not elsewhere classified, thoracic region, subsequent encounter for fracture with routine healing: Secondary | ICD-10-CM | POA: Diagnosis not present

## 2024-01-01 ENCOUNTER — Encounter: Payer: Self-pay | Admitting: Diagnostic Neuroimaging

## 2024-01-01 ENCOUNTER — Ambulatory Visit: Admitting: Diagnostic Neuroimaging

## 2024-01-01 VITALS — BP 138/72 | Resp 16 | Ht 61.0 in | Wt 102.2 lb

## 2024-01-01 DIAGNOSIS — H5316 Psychophysical visual disturbances: Secondary | ICD-10-CM

## 2024-01-01 NOTE — Progress Notes (Signed)
 [facility]  PATIENT: Shannon Berger DOB: 1940-08-02  REFERRING CLINICIAN: Shayne Anes, MD HISTORY FROM: patient  REASON FOR VISIT: new consult   HISTORICAL  CHIEF COMPLAINT:  Chief Complaint  Patient presents with   New Patient (Initial Visit)    Rm6, husband present,  issues with eyes - seeing pink splotches/spatters in vision that is intermittent. Pt stated that she cannot pinpoint what makes its worse or makes it flare up     HISTORY OF PRESENT ILLNESS:   UPDATE (01/01/24, VRP): Since last visit, doing about the same with memory issues. Now having some intermittent visual disturbance, pink splotches of color, binocular, with eyes open or closed. Lasts few seconds. Goes away if patient is distracted. Patient's vision has deteriorated significantly over last few years.  PRIOR HPI (09/23/23, VRP): 83 year old female here for evaluation of memory loss.  For past 5 years patient has noted some mild memory loss issues, forgetting people's names, losing train of thought.  No change in ADLs.  Husband has noted some mild changes but does not think it is that significant.  Patient's children have noticed some mild changes as well.  She also has some longstanding issues with anxiety, depression, insomnia.  Has been to PCP and had pTau217 serum testing which was slightly elevated.  Patient referred here for further evaluation and treatment options.  Of note patient has had decline in vision and hearing ability over the last few years.  Has some history of essential tremor.  Also has some mild sleep apnea on treatment.   REVIEW OF SYSTEMS: Full 14 system review of systems performed and negative with exception of: as per HPI.  ALLERGIES: Allergies  Allergen Reactions   Oxycodone -Acetaminophen      hallucinations   Versed  [Midazolam ] Other (See Comments)    Had trouble waking up    Hydrocodone -Acetaminophen      hallucination     HOME MEDICATIONS: Outpatient  Medications Prior to Visit  Medication Sig Dispense Refill   acetaminophen  (TYLENOL ) 500 MG tablet Take 500 mg by mouth every 6 (six) hours as needed for moderate pain or headache.     ALPRAZolam  (XANAX ) 0.5 MG tablet Take 0.25-0.5 mg by mouth at bedtime as needed for sleep or anxiety.     amLODipine  (NORVASC ) 5 MG tablet TAKE 1 TABLET(5 MG) BY MOUTH DAILY 90 tablet 3   Ascorbic Acid  (VITAMIN C ) 1000 MG tablet Take 1,000 mg by mouth every evening.     aspirin  EC 325 MG tablet Take 325 mg by mouth daily.     atorvastatin  (LIPITOR) 40 MG tablet Take 40 mg by mouth at bedtime.  3   Cyanocobalamin  (VITAMIN B-12) 5000 MCG TBDP Take by mouth at bedtime.     denosumab  (PROLIA ) 60 MG/ML SOSY injection Inject 60 mg into the skin every 6 (six) months.     desvenlafaxine (PRISTIQ) 100 MG 24 hr tablet Take 100 mg by mouth in the morning.     ezetimibe  (ZETIA ) 10 MG tablet Take 10 mg by mouth at bedtime.     lamoTRIgine (LAMICTAL) 200 MG tablet Take 100 mg by mouth daily.     Multiple Vitamin (MULTIVITAMIN) tablet Take 1 tablet by mouth in the morning.     olmesartan (BENICAR) 40 MG tablet Take 40 mg by mouth in the morning.     REPATHA SURECLICK 140 MG/ML SOAJ Inject 1 Syringe into the skin every 14 (fourteen) days.     tretinoin (RETIN-A) 0.05 % cream Apply 1  Application topically daily as needed (skin blemishes.). Apply a pea sized amount to face     zinc gluconate 50 MG tablet Take 50 mg by mouth at bedtime.     Facility-Administered Medications Prior to Visit  Medication Dose Route Frequency Provider Last Rate Last Admin   gemcitabine  (GEMZAR ) chemo syringe for bladder instillation 2,000 mg  2,000 mg Bladder Instillation Once Winter, Lonni Righter, MD        PAST MEDICAL HISTORY: Past Medical History:  Diagnosis Date   Anginal pain (HCC)    occasionally   Anxiety    Arthritis    Bladder cancer (HCC)    Carotid arterial disease (HCC)    Chronic kidney disease    Complication of  anesthesia    hallucinates for days; have a hard time waking up; almost couldn't get her awake last time; wake up wild (05/26/2018)   Constipation    Degeneration of lumbar intervertebral disc    Dislocation of hip joint prosthesis (HCC)    Essential tremor    Fusion of spine of lumbar region    Heart murmur    slight one (05/26/2018)   Hyperlipidemia    Hypertension    Low back pain radiating to left lower extremity    Melanoma (HCC)    center of upper chest   Osteoarthritis    Osteopenia    Sleep difficulties    Spinal stenosis at L4-L5 level    Stroke [facility])    tia--last week   TIA (transient ischemic attack) 05/20/2018    PAST SURGICAL HISTORY: Past Surgical History:  Procedure Laterality Date   ANTERIOR LUMBAR FUSION     added rods and screws;   BACK SURGERY     BLADDER SURGERY     BREAST BIOPSY Left    not cancer   BUNIONECTOMY Left    CAROTID ENDARTERECTOMY Left 05/26/2018   CATARACT EXTRACTION W/ INTRAOCULAR LENS  IMPLANT, BILATERAL Bilateral    CYSTOSCOPY N/A 03/25/2023   Procedure: CYSTOSCOPY;  Surgeon: [name], MD;  Location: [facility];  Service: Urology;  Laterality: N/A;   ENDARTERECTOMY Left 05/26/2018   Procedure: ENDARTERECTOMY CAROTID LEFT;  Surgeon: [name], MD;  Location: [facility] OR;  Service: Vascular;  Laterality: Left;   EYE SURGERY Right    retina wrinkle OR; now can't see out of that eye (05/26/2018)   JOINT REPLACEMENT Left 2014   Hip   LUMBAR SPINE HARDWARE REMOVAL     went in thru the front   MELANOMA EXCISION     center of upper chest   PATCH ANGIOPLASTY Left 05/26/2018   Procedure: PATCH ANGIOPLASTY LEFT CAROTID ARTERY;  Surgeon: [name], MD;  Location: [facility] OR;  Service: Vascular;  Laterality: Left;   PILONIDAL CYST DRAINAGE     POSTERIOR LUMBAR FUSION  X 2   TENDON REPAIR Left    Reattachment of tendon- Left hip   THUMB FUSION Right    TONSILLECTOMY AND ADENOIDECTOMY     TOTAL HIP  ARTHROPLASTY Left 09/04/2012   Procedure: LEFT TOTAL HIP ARTHROPLASTY ANTERIOR APPROACH;  Surgeon: [name], MD;  Location: [facility];  Service: Orthopedics;  Laterality: Left;  Left Total Hip Arthroplasty, Anterior Approach   TRANSURETHRAL RESECTION OF BLADDER TUMOR N/A 06/06/2020   Procedure: TRANSURETHRAL RESECTION OF BLADDER TUMOR(TURBT) WITH CYSTOSCOPY/ INSTILLATION OF GEMCITABINE  WITH RIGHT STENT PLACEMENT;  Surgeon: [name], MD;  Location: [facility];  Service: Urology;  Laterality: N/A;   TRANSURETHRAL RESECTION OF BLADDER  TUMOR N/A 03/25/2023   Procedure: TRANSURETHRAL RESECTION OF BLADDER TUMOR (TURBT);  Surgeon: [name], MD;  Location: [facility];  Service: Urology;  Laterality: N/A;  30 MINUTES    FAMILY HISTORY: Family History  Problem Relation Age of Onset   Heart failure Mother    Cancer Mother        colon   Hypertension Mother    Other Mother        Varicose Veins   Diabetes Father    Heart disease Father    Hypertension Father    Heart attack Father    Cancer Brother        squamos cell - head, neck, ear   Healthy Son    Migraines Daughter     SOCIAL HISTORY: Social History   Socioeconomic History   Marital status: Married    Spouse name: jerry   Number of children: 2   Years of education: Not on file   Highest education level: Bachelor's degree (e.g., BA, AB, BS)  Occupational History   Occupation: Research officer, political party: RETIRED    Comment: retired  Tobacco Use   Smoking status: Never   Smokeless tobacco: Never  Vaping Use   Vaping status: Never Used  Substance and Sexual Activity   Alcohol  use: Not Currently    Comment: 1 a month   Drug use: Never   Sexual activity: Not Currently    Partners: Male    Birth control/protection: Post-menopausal    Comment: married  Other Topics Concern   Not on file  Social History Narrative   Pt lives at home with spouse jerry, 2 story home has 2 children   She is  right handed, she drinks ginger ale and come tea, no coffee, or soda   Social Drivers of Corporate investment banker Strain: Not on file  Food Insecurity: Not on file  Transportation Needs: No Transportation Needs (02/20/2023)   PRAPARE - Administrator, Civil Service (Medical): No    Lack of Transportation (Non-Medical): No  Physical Activity: Not on file  Stress: Not on file  Social Connections: Not on file  Intimate Partner Violence: Not on file     PHYSICAL EXAM  GENERAL EXAM/CONSTITUTIONAL: Vitals:  Vitals:   01/01/24 1442  BP: 138/72  Resp: 16  Weight: 102 lb 3.2 oz (46.4 kg)  Height: 5' 1 (1.549 m)   Body mass index is 19.31 kg/m. Wt Readings from Last 3 Encounters:  01/01/24 102 lb 3.2 oz (46.4 kg)  11/06/23 102 lb 11.2 oz (46.6 kg)  09/23/23 102 lb (46.3 kg)   Patient is in no distress; well developed, nourished and groomed; neck is supple  CARDIOVASCULAR: Examination of carotid arteries is normal; no carotid bruits Regular rate and rhythm, no murmurs Examination of peripheral vascular system by observation and palpation is normal  EYES: Ophthalmoscopic exam of optic discs and posterior segments is normal; no papilledema or hemorrhages No results found.  MUSCULOSKELETAL: Gait, strength, tone, movements noted in Neurologic exam below  NEUROLOGIC: MENTAL STATUS:      No data to display            09/23/2023   11:59 AM  Montreal Cognitive Assessment   Visuospatial/ Executive (0/5) 3  Naming (0/3) 2  Attention: Read list of digits (0/2) 2  Attention: Read list of letters (0/1) 1  Attention: Serial 7 subtraction starting at 100 (0/3) 2  Language: Repeat phrase (0/2) 2  Language : Fluency (0/1) 1  Abstraction (0/2) 2  Delayed Recall (0/5) 3  Orientation (0/6) 5  Total 23    awake, alert, oriented to person, place and time recent and remote memory intact normal attention and concentration language fluent, comprehension intact,  naming intact fund of knowledge appropriate  CRANIAL NERVE:  2nd - no papilledema on fundoscopic exam 2nd, 3rd, 4th, 6th - pupils equal and reactive to light, visual fields full to confrontation, extraocular muscles intact, no nystagmus 5th - facial sensation symmetric 7th - facial strength symmetric 8th - hearing intact 9th - palate elevates symmetrically, uvula midline 11th - shoulder shrug symmetric 12th - tongue protrusion midline  MOTOR:  POSTURAL, ACTION TREMOR IN HEAD AND ARMS normal bulk and tone, full strength in the BUE, BLE  SENSORY:  normal and symmetric to light touch, temperature, vibration  COORDINATION:  finger-nose-finger, fine finger movements normal  REFLEXES:  deep tendon reflexes BRISK and symmetric  GAIT/STATION:  narrow based gait; USING CANE   DIAGNOSTIC DATA (LABS, IMAGING, TESTING) - I reviewed patient records, labs, notes, testing and imaging myself where available.  Lab Results  Component Value Date   WBC 5.5 06/20/2023   HGB 13.0 06/20/2023   HCT 38.6 06/20/2023   MCV 98.0 06/20/2023   PLT 282 06/20/2023      Component Value Date/Time   NA 138 06/20/2023 1528   NA 142 06/14/2022 1107   K 3.8 06/20/2023 1528   CL 102 06/20/2023 1528   CO2 25 06/20/2023 1528   GLUCOSE 129 (H) 06/20/2023 1528   BUN 20 06/20/2023 1528   BUN 34 (H) 06/14/2022 1107   CREATININE 1.02 (H) 06/20/2023 1528   CALCIUM  9.6 06/20/2023 1528   PROT 7.3 02/12/2023 1729   ALBUMIN 4.1 02/12/2023 1729   AST 41 02/12/2023 1729   ALT 26 02/12/2023 1729   ALKPHOS 77 02/12/2023 1729   BILITOT 0.7 02/12/2023 1729   GFRNONAA 55 (L) 06/20/2023 1528   GFRAA 52 (L) 05/27/2018 0322   Lab Results  Component Value Date   CHOL 174 05/21/2018   HDL 71 05/21/2018   LDLCALC 91 05/21/2018   TRIG 59 05/21/2018   CHOLHDL 2.5 05/21/2018   Lab Results  Component Value Date   HGBA1C 5.4 05/21/2018   Lab Results  Component Value Date   VITAMINB12 >2000 (H) 09/29/2023    No results found for: TSH   06/20/23 CT head [I reviewed images myself and agree with interpretation. -VRP]  - No acute intracranial abnormality.   12/09/23 MRI brain - No acute intracranial abnormality. Specifically, no evidence of acute infarct.    ASSESSMENT AND PLAN  83 y.o. year old female here with:  Dx:  1. 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); not bothering patient that much; follow up with ophthalmology for vision impairment mgmt   MILD MEMORY DISTURBANCE (since ~2020; contributing factors --> vision impairment, hearing impairment, anxiety; MoCA 23/30; elevated [id number])  - symptoms could be due to mild cognitive impairment vs other contributing factors above; discussed addl testing options (ATN panel, amyloid PET scan) and benefits / risks of amyloid targeting therapies; she will think about it and let me know  - try to stay active physically and get some exercise (at least 15-30 minutes per  day) - eat a nutritious diet with lean protein, plants / vegetables, whole grains; avoid ultra-processed foods - increase social activities, brain stimulation, games, puzzles, hobbies, crafts, arts, music; try new activities; keep it fun! - aim for at least 7-8 hours sleep per night (or more) - avoid smoking and alcohol  - caution with medications, finances; not currently driving - safety / supervision issues reviewed  Return for pending if symptoms worsen or fail to improve, return to PCP.    Shannon FABIENE HANLON, MD 01/01/2024, 3:25 PM Certified in Neurology, Neurophysiology and Neuroimaging  [facility] [address] [place], [place] [address] [phone]

## 2024-01-01 NOTE — Patient Instructions (Addendum)
  VISION DISTURBANCE (small pink dots, splatter of paint; since April 2025; occurs throughout the day; can last seconds or hours; goes away with distraction) - could be a type of release visual hallucination (Charles-Bonnet syndrome); not bothering patient that much; follow up with ophthalmology for vision impairment mgmt

## 2024-01-15 DIAGNOSIS — H40002 Preglaucoma, unspecified, left eye: Secondary | ICD-10-CM | POA: Diagnosis not present

## 2024-01-15 DIAGNOSIS — H47011 Ischemic optic neuropathy, right eye: Secondary | ICD-10-CM | POA: Diagnosis not present

## 2024-01-15 DIAGNOSIS — Z961 Presence of intraocular lens: Secondary | ICD-10-CM | POA: Diagnosis not present

## 2024-01-15 DIAGNOSIS — H35371 Puckering of macula, right eye: Secondary | ICD-10-CM | POA: Diagnosis not present

## 2024-01-26 DIAGNOSIS — Z681 Body mass index (BMI) 19 or less, adult: Secondary | ICD-10-CM | POA: Diagnosis not present

## 2024-01-26 DIAGNOSIS — M549 Dorsalgia, unspecified: Secondary | ICD-10-CM | POA: Diagnosis not present

## 2024-02-02 DIAGNOSIS — J029 Acute pharyngitis, unspecified: Secondary | ICD-10-CM | POA: Diagnosis not present

## 2024-02-02 DIAGNOSIS — U071 COVID-19: Secondary | ICD-10-CM | POA: Diagnosis not present

## 2024-02-05 ENCOUNTER — Institutional Professional Consult (permissible substitution): Admitting: Diagnostic Neuroimaging

## 2024-03-01 ENCOUNTER — Ambulatory Visit: Admitting: Podiatry

## 2024-03-19 NOTE — Progress Notes (Signed)
 Cardiology Office Note    Date:  03/25/2024   ID:  Shannon Berger 12/27/1940, MRN [id number]  PCP:  Shannon Anes, MD  Cardiologist:  Dr. Swazil[place]  Chief Complaint  Patient presents with   Coronary Artery Disease    History of Present Illness:  Shannon Berger is a 83 y.o. female is seen for follow up CAD. She has a  past medical history of carotid artery disease, hypertension, hyperlipidemia, ischemic optic neuropathy, and osteopenia.  She was seen in 2016 for evaluation of chest pain.  She had a normal stress echo in 2012 and normal ETT in 2008.  Myoview on 03/24/2015 which showed EF 68%, normal perfusion, overall low risk study.     She was seen by Shannon Ford PA-C in October 2019 for symptoms of dyspnea on exertion. She had evaluation with an Echo showing normal LV systolic and valve function. Grade 2 diastolic dysfunction. Coronary CTA was done showing calcium  score of 264 but nonobstructive CAD. It was felt that her scoliosis may be contributing to her dyspnea. She reported having PFTs but no results available for us  to review.  In November 2019 she was admitted with aphasia and left sided weakness. CT and MRI showed no acute findings but carotid dopplers showed progression of carotid arterial disease with > 80% left carotid stenosis. She subsequently underwent left CEA by Dr Shannon on 05/26/18 without complications. 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 This has been asymptomatic so managed conservatively.   She was seen in February 2023  for atypical chest pain for which she was evaluated at [facility] ED. A coronary CTA was done again August of 2023 and this showed nonobstructive CAD.   When seen previously she was getting low BP readings to 80 systolic. Was symptomatic. We stopped her lasix and potassium. She states she quit taking hydralazine  as well. We eventually got her on  amlodipine  and she states this has worked very well for her.   Carotid dopplers in April this year showed no significant stenosis.   She denies any chest pain, dyspnea or palpitations. Has chronic back pain with prior surgeries. She is frustrated with declining vision and hearing.   Past Medical History:  Diagnosis Date   Anginal pain (HCC)    occasionally   Anxiety    Arthritis    Bladder cancer (HCC)    Carotid arterial disease (HCC)    Chronic kidney disease    Complication of anesthesia    hallucinates for days; have a hard time waking up; almost couldn't get her awake last time; wake up wild (05/26/2018)   Constipation    Degeneration of lumbar intervertebral disc    Dislocation of hip joint prosthesis (HCC)    Essential tremor    Fusion of spine of lumbar region    Heart murmur    slight one (05/26/2018)   Hyperlipidemia    Hypertension    Low back pain radiating to left lower extremity    Melanoma (HCC)    center of upper chest   Osteoarthritis    Osteopenia    Sleep difficulties    Spinal stenosis at L4-L5 level    Stroke [facility])    tia--last week   TIA (transient ischemic attack) 05/20/2018    Past Surgical History:  Procedure Laterality Date   ANTERIOR LUMBAR FUSION     added rods and screws;  BACK SURGERY     BLADDER SURGERY     BREAST BIOPSY Left    not cancer   BUNIONECTOMY Left    CAROTID ENDARTERECTOMY Left 05/26/2018   CATARACT EXTRACTION W/ INTRAOCULAR LENS  IMPLANT, BILATERAL Bilateral    CYSTOSCOPY N/A 03/25/2023   Procedure: CYSTOSCOPY;  Surgeon: Shannon Shannon Righter, MD;  Location: [facility];  Service: Urology;  Laterality: N/A;   ENDARTERECTOMY Left 05/26/2018   Procedure: ENDARTERECTOMY CAROTID LEFT;  Surgeon: Shannon Shannon RAMAN, MD;  Location: [facility] OR;  Service: Vascular;  Laterality: Left;   EYE SURGERY Right    retina wrinkle OR; now can't see out of that eye (05/26/2018)   JOINT REPLACEMENT Left 2014   Hip   LUMBAR SPINE  HARDWARE REMOVAL     went in thru the front   MELANOMA EXCISION     center of upper chest   PATCH ANGIOPLASTY Left 05/26/2018   Procedure: PATCH ANGIOPLASTY LEFT CAROTID ARTERY;  Surgeon: Shannon Shannon RAMAN, MD;  Location: [facility] OR;  Service: Vascular;  Laterality: Left;   PILONIDAL CYST DRAINAGE     POSTERIOR LUMBAR FUSION  X 2   TENDON REPAIR Left    Reattachment of tendon- Left hip   THUMB FUSION Right    TONSILLECTOMY AND ADENOIDECTOMY     TOTAL HIP ARTHROPLASTY Left 09/04/2012   Procedure: LEFT TOTAL HIP ARTHROPLASTY ANTERIOR APPROACH;  Surgeon: Shannon CINDERELLA Poli, MD;  Location: [facility];  Service: Orthopedics;  Laterality: Left;  Left Total Hip Arthroplasty, Anterior Approach   TRANSURETHRAL RESECTION OF BLADDER TUMOR N/A 06/06/2020   Procedure: TRANSURETHRAL RESECTION OF BLADDER TUMOR(TURBT) WITH CYSTOSCOPY/ INSTILLATION OF GEMCITABINE  WITH RIGHT STENT PLACEMENT;  Surgeon: Shannon Shannon Righter, MD;  Location: [facility];  Service: Urology;  Laterality: N/A;   TRANSURETHRAL RESECTION OF BLADDER TUMOR N/A 03/25/2023   Procedure: TRANSURETHRAL RESECTION OF BLADDER TUMOR (TURBT);  Surgeon: Shannon Shannon Righter, MD;  Location: [facility];  Service: Urology;  Laterality: N/A;  30 MINUTES    Current Medications: Outpatient Medications Prior to Visit  Medication Sig Dispense Refill   acetaminophen  (TYLENOL ) 500 MG tablet Take 500 mg by mouth every 6 (six) hours as needed for moderate pain or headache.     ALPRAZolam  (XANAX ) 0.5 MG tablet Take 0.25-0.5 mg by mouth at bedtime as needed for sleep or anxiety.     amLODipine  (NORVASC ) 5 MG tablet TAKE 1 TABLET(5 MG) BY MOUTH DAILY 90 tablet 3   Ascorbic Acid  (VITAMIN C ) 1000 MG tablet Take 1,000 mg by mouth every evening.     aspirin  EC 325 MG tablet Take 325 mg by mouth daily.     atorvastatin  (LIPITOR) 40 MG tablet Take 40 mg by mouth at bedtime.  3   Cyanocobalamin  (VITAMIN B-12) 5000 MCG TBDP Take by mouth at bedtime.      denosumab  (PROLIA ) 60 MG/ML SOSY injection Inject 60 mg into the skin every 6 (six) months.     desvenlafaxine (PRISTIQ) 100 MG 24 hr tablet Take 100 mg by mouth in the morning.     ezetimibe  (ZETIA ) 10 MG tablet Take 10 mg by mouth at bedtime.     lamoTRIgine (LAMICTAL) 200 MG tablet Take 100 mg by mouth daily.     latanoprost (XALATAN) 0.005 % ophthalmic solution Place 1 drop into the left eye at bedtime. (Patient taking differently: Place 1 drop into both eyes at bedtime.)     Multiple Vitamin (MULTIVITAMIN) tablet Take 1 tablet by mouth in the morning.  nitroGLYCERIN  (NITROSTAT ) 0.4 MG SL tablet every 5 (five) minutes as needed for chest pain.     olmesartan (BENICAR) 40 MG tablet Take 40 mg by mouth in the morning.     REPATHA SURECLICK 140 MG/ML SOAJ Inject 1 Syringe into the skin every 14 (fourteen) days.     tretinoin (RETIN-A) 0.05 % cream Apply 1 Application topically daily as needed (skin blemishes.). Apply a pea sized amount to face     zinc gluconate 50 MG tablet Take 50 mg by mouth at bedtime.     Facility-Administered Medications Prior to Visit  Medication Dose Route Frequency Provider Last Rate Last Admin   gemcitabine  (GEMZAR ) chemo syringe for bladder instillation 2,000 mg  2,000 mg Bladder Instillation Once Winter, Shannon Righter, MD         Allergies:   Oxycodone -acetaminophen , Versed  [midazolam ], and Hydrocodone -acetaminophen    Social History   Socioeconomic History   Marital status: Married    Spouse name: jerry   Number of children: 2   Years of education: Not on file   Highest education level: Bachelor's degree (e.g., BA, AB, BS)  Occupational History   Occupation: Research officer, political party: RETIRED    Comment: retired  Tobacco Use   Smoking status: Never   Smokeless tobacco: Never  Vaping Use   Vaping status: Never Used  Substance and Sexual Activity   Alcohol  use: Not Currently    Comment: 1 a month   Drug use: Never   Sexual activity: Not  Currently    Partners: Male    Birth control/protection: Post-menopausal    Comment: married  Other Topics Concern   Not on file  Social History Narrative   Pt lives at home with spouse jerry, 2 story home has 2 children   She is right handed, she drinks ginger ale and come tea, no coffee, or soda   Social Drivers of Corporate investment banker Strain: Not on file  Food Insecurity: Not on file  Transportation Needs: No Transportation Needs (02/20/2023)   PRAPARE - Administrator, Civil Service (Medical): No    Lack of Transportation (Non-Medical): No  Physical Activity: Not on file  Stress: Not on file  Social Connections: Not on file     Family History:  The patient's family history includes Cancer in her brother and mother; Diabetes in her father; Healthy in her son; Heart attack in her father; Heart disease in her father; Heart failure in her mother; Hypertension in her father and mother; Migraines in her daughter; Other in her mother.   ROS:   Please see the history of present illness.    ROS All other systems reviewed and are negative.   PHYSICAL EXAM:   VS:  BP 125/74   Pulse 71   Ht 5' 1 (1.549 m)   Wt 100 lb (45.4 kg)   SpO2 98%   BMI 18.89 kg/m    GEN: Well nourished, well developed, in no acute distress  HEENT: normal  Neck: no JVD, carotid bruits, or masses Cardiac: RRR; no murmurs, rubs, or gallops,no edema  Respiratory:  clear to auscultation bilaterally, normal work of breathing GI: soft, nontender, nondistended, + BS MS: no deformity or atrophy  Skin: warm and dry, no rash Neuro:  Alert and Oriented x 3, Strength and sensation are intact Psych: euthymic mood, full affect  Wt Readings from Last 3 Encounters:  03/25/24 100 lb (45.4 kg)  01/01/24 102 lb 3.2 oz (46.4  kg)  11/06/23 102 lb 11.2 oz (46.6 kg)      Studies/Labs Reviewed:    Recent Labs: 06/20/2023: BUN 20; Creatinine, Ser 1.02; Hemoglobin 13.0; Platelets 282; Potassium 3.8;  Sodium 138   Lipid Panel    Component Value Date/Time   CHOL 174 05/21/2018 0412   TRIG 59 05/21/2018 0412   HDL 71 05/21/2018 0412   CHOLHDL 2.5 05/21/2018 0412   VLDL 12 05/21/2018 0412   LDLCALC 91 05/21/2018 0412   Dated 04/20/21: creatinine 1.43. otherwise CMET and CBC normal. Dated 06/15/21: cholesterol 110, triglycerides 41, HDL 75, LDL 27.  Dated 07/16/22: cholesterol 123, triglycerides 53, HDL 85, LDL 27. BUN 41, creatinine 1.2. otherwise CMET and TSH normal.  Dated 09/11/23: cholesterol 115, triglycerides 67, HDL 85, LDL 17, CMET and TSH normal.       Additional studies/ records that were reviewed today include:   Myoview 03/24/2015 Study Highlights   The left ventricular ejection fraction is hyperdynamic (>65%). Nuclear stress EF: 68%. There was no ST segment deviation noted during stress. The study is normal. This is a low risk study. Normal myocardial perfusion and function   Low risk lexiscan  nuclear stress test demonstrating normal myocardial perfusion and function; EF 68%,   Echo 05/21/18: Study Conclusions   - Left ventricle: The cavity size was normal. Wall thickness was   normal. Systolic function was normal. The estimated ejection   fraction was in the range of 60% to 65%. Wall motion was normal;   there were no regional wall motion abnormalities. Features are   consistent with a pseudonormal left ventricular filling pattern,   with concomitant abnormal relaxation and increased filling   pressure (grade 2 diastolic dysfunction). - Mitral valve: There was mild regurgitation. - Pulmonary arteries: Systolic pressure was mildly increased. PA   peak pressure: 34 mm Hg (S).  ADDENDUM REPORT: 06/01/2018 22:20   CLINICAL DATA:  Chest pain   EXAM: Cardiac CTA   MEDICATIONS: Sub lingual nitro. 4mg  x 2   TECHNIQUE: The patient was scanned on a Siemens 192 slice scanner. Gantry rotation speed was 250 msecs. Collimation was 0.6 mm. A 100 kV prospective scan  was triggered in the ascending thoracic aorta at 35-75% of the R-R interval. Average HR during the scan was 60 bpm. The 3D data set was interpreted on a dedicated work station using MPR, MIP and VRT modes. A total of 80cc of contrast was used.   FINDINGS: Non-cardiac: See separate report from [facility].   The pulmonary veins drained normally to the left atrium.   Calcium  Score: 260 Agatston units.   Coronary Arteries: Right dominant with no anomalies   LM: No plaque or stenosis.   LAD system: Calcified plaque proximal LAD with mild (<50%) stenosis.   Circumflex system: Moderate ramus with no significant disease. The AV LCx was relatively small with no plaque or stenosis.   RCA system: Calcified plaque with mild stenosis (<50%) in the proximal RCA.   IMPRESSION: 1. Coronary artery calcium  score 260 Agatston units. This places the patient in the 73rd percentile for age and gender, suggesting intermediate risk for future cardiac events.   2.  Nonobstructive CAD.   Dalton Mclean   OVER-READ INTERPRETATION  CT CHEST   The following report is an over-read performed by radiologist Dr. Franky Crease of [facility] on 06/01/2018. This over-read does not include interpretation of cardiac or coronary anatomy or pathology. The coronary CTA interpretation by the cardiologist is attached.  COMPARISON:  Chest CT 12/26/2015   FINDINGS: Vascular: Tortuous aorta.  No aneurysm.  Heart is normal size.   Mediastinum/Nodes: No adenopathy in the lower mediastinum or hila.   Lungs/Pleura: No confluent opacities or effusions.   Upper Abdomen: Small low-density lesion in the right hepatic lobe is stable most compatible with cyst. No acute findings   Musculoskeletal: Chest wall soft tissues are unremarkable. No acute bony abnormality.   IMPRESSION: No acute extra cardiac abnormality.   Tortuous aorta.   Electronically Signed: By: Franky Crease M.D. On:  06/01/2018 11:45  Coronary CTA 02/07/22:  EXAM: OVER-READ INTERPRETATION  CT CHEST   The following report is a limited chest CT over-read performed by radiologist Dr. Elsie Ko [facility] on 02/07/2022. This over-read does not include interpretation of cardiac or coronary anatomy or pathology. The coronary CTA interpretation by the cardiologist is attached.   COMPARISON:  Chest CTA 02/06/2021   FINDINGS: Mediastinum/Nodes: No enlarged lymph nodes within the visualized mediastinum.   Lungs/Pleura: There is no pleural effusion. Stable mild bronchiectasis and scarring, primarily within the right middle lobe and lingula. No airspace disease or suspicious pulmonary nodule.   Upper abdomen: The visualized upper abdomen appears stable without significant findings. Probable cyst in the dome of the right hepatic lobe appears unchanged.   Musculoskeletal/Chest wall: No chest wall mass or suspicious osseous findings within the visualized chest. Stable chronic compression deformities at T11 and T12.   IMPRESSION: No significant extracardiac findings within the visualized chest.     Electronically Signed   By: Elsie Perone M.D.   On: 02/07/2022 14:55    Addended by Perone Elsie, MD on 02/07/2022  2:57 PM   Study Result  Narrative & Impression  CLINICAL DATA:  55F with hypertension, hyperlipidemia, carotid stenosis, and dyspnea.   EXAM: Cardiac/Coronary  CT   TECHNIQUE: The patient was scanned on a [employer].   FINDINGS: A 120 kV prospective scan was triggered in the descending thoracic aorta at 111 HU's. Axial non-contrast 3 mm slices were carried out through the heart. The data set was analyzed on a dedicated work station and scored using the Agatson method. Gantry rotation speed was 250 msecs and collimation was .6 mm. No beta blockade and 0.8 mg of sl NTG was given. The 3D data set was reconstructed in 5% intervals of the 67-82 % of  the R-R cycle. Diastolic phases were analyzed on a dedicated work station using MPR, MIP and VRT modes. The patient received 80 cc of contrast.   Aorta: Normal size.  No calcifications.  No dissection.   Aortic Valve:  Trileaflet.  No calcifications.   Coronary Arteries:  Normal coronary origin.  Right dominance.   RCA is a large dominant artery that gives rise to PDA and PLVB. There is moderate(50-69%) calcified plaque in the proximal RCA and minimal (<25%) calcified plaque in the mid vessel.   Left main is a large artery that gives rise to LAD and LCX arteries.   LAD is a large vessel that has mild (25-49%) calcified plaque proximally and minimal (<25%) soft plaque distally. D1 is a small vessel without plaque.   LCX is a non-dominant artery.  There is no plaque.   Coronary Calcium  Score:   Left main: 0   Left anterior descending artery: 223   Left circumflex artery: 0   Right coronary artery: 104   Total: 326   Percentile: 72nd   Other findings:   Normal pulmonary vein  drainage into the left atrium.   Normal let atrial appendage without a thrombus.   Normal size of the pulmonary artery.   Mild mitral annular calcification.   IMPRESSION: 1. Coronary calcium  score of 326. This was 72nd percentile for age-, race-, and sex-matched controls.   2. Normal coronary origin with right dominance.   3. Moderate (50-69%) plaque in the RCA and mild (25-49%) plaque in the LAD. CAD-RADS 3.   4.  Will send study for FFRct.   Interpretation of the non-cardiac intrathoracic structures by Radiology is pending.   Annabella Scarce, MD   Electronically Signed: By: Annabella Scarce M.D. On: 02/07/2022 13:17    FFRCT ANALYSIS for abnormal coronary CT-A.   FINDINGS: FFRct analysis was performed on the original cardiac CT angiogram dataset. Diagrammatic representation of the FFRct analysis is provided in a separate PDF document in PACS. This dictation was created  using the PDF document and an interactive 3D model of the results. 3D model is not available in the EMR/PACS. Normal FFR range is >0.80.   1. Left Main: FFRct 0.99   2. LAD: FFRct 0.92 proximal, 0.91 mid   3. LCX: FFRct 0.97   4. Ramus: FFRct 0.95   5. RCA: FFRct 0.94 proximal, 0.91 mid, 0.91 distal   IMPRESSION: FFRct findings are consistent with non-obstructive CAD.   Tiffany C. Scarce, MD     Electronically Signed   By: Annabella Scarce M.D.   On: 02/07/2022 13:22    ASSESSMENT:    1. CAD in native artery   2. Primary hypertension          PLAN:  In order of problems listed above:  CAD.  Had nonobstructive CAD by CTA in 2019. Repeat CTA Aug 2023 again showed nonobstructive CAD. Normal FFR evaluation. No angina. Continue to focus on risk factor modification.   Bilateral carotid artery disease: s/p left CEA. Followed by [facility]. Carotid dopplers planned in April  Hypertension: Blood pressure readings are excellent  today. Continue current therapy  Hyperlipidemia:on lipitor, Zetia  and Repatha. Goal LDL < 70. Last LDL 17  5.   Mesenteric stenosis - celiac axis and superior mesenteric arteries 70%. Followed by [facility]. No symptoms   Follow up in 6 months  Medication Adjustments/Labs and Tests Ordered: Current medicines are reviewed at length with the patient today.  Concerns regarding medicines are outlined above.  Medication changes, Labs and Tests ordered today are listed in the Patient Instructions below. Patient Instructions  Medication Instructions:   *If you need a refill on your cardiac medications before your next appointment, please call your pharmacy*   Lab Work:  If you have labs (blood work) drawn today and your tests are completely normal, you will receive your results only by: MyChart Message (if you have MyChart) OR A paper copy in the mail If you have any lab test that is abnormal or we need to change your treatment, we will call you to review  the results.   Testing/Procedures:    Follow-Up: At [facility], you and your health needs are our priority.  As part of our continuing mission to provide you with exceptional heart care, we have created designated Provider Care Teams.  These Care Teams include your primary Cardiologist (physician) and Advanced Practice Providers (APPs -  Physician Assistants and Nurse Practitioners) who all work together to provide you with the care you need, when you need it.  We recommend signing up for the patient portal called MyChart.  Sign up information is provided on this After Visit Summary.  MyChart is used to connect with patients for Virtual Visits (Telemedicine).  Patients are able to view lab/test results, encounter notes, upcoming appointments, etc.  Non-urgent messages can be sent to your provider as well.   To learn more about what you can do with MyChart, go to [email or web address].    Your next appointment:   6 months  Provider:       Other Instructions     Signed, Raneshia Derick [place], MD  03/25/2024 1:44 PM    [facility] [address] [place], [place], [place]  [address] Phone: [phone]; Fax: [phone]

## 2024-03-25 ENCOUNTER — Ambulatory Visit: Attending: Cardiology | Admitting: Cardiology

## 2024-03-25 ENCOUNTER — Encounter: Payer: Self-pay | Admitting: Cardiology

## 2024-03-25 VITALS — BP 125/74 | HR 71 | Ht 61.0 in | Wt 100.0 lb

## 2024-03-25 DIAGNOSIS — I10 Essential (primary) hypertension: Secondary | ICD-10-CM

## 2024-03-25 DIAGNOSIS — E785 Hyperlipidemia, unspecified: Secondary | ICD-10-CM

## 2024-03-25 DIAGNOSIS — I6523 Occlusion and stenosis of bilateral carotid arteries: Secondary | ICD-10-CM | POA: Diagnosis not present

## 2024-03-25 DIAGNOSIS — I2510 Atherosclerotic heart disease of native coronary artery without angina pectoris: Secondary | ICD-10-CM | POA: Diagnosis not present

## 2024-03-25 NOTE — Patient Instructions (Signed)

## 2024-03-29 DIAGNOSIS — I129 Hypertensive chronic kidney disease with stage 1 through stage 4 chronic kidney disease, or unspecified chronic kidney disease: Secondary | ICD-10-CM | POA: Diagnosis not present

## 2024-03-29 DIAGNOSIS — Z23 Encounter for immunization: Secondary | ICD-10-CM | POA: Diagnosis not present

## 2024-03-29 DIAGNOSIS — M810 Age-related osteoporosis without current pathological fracture: Secondary | ICD-10-CM | POA: Diagnosis not present

## 2024-03-31 ENCOUNTER — Other Ambulatory Visit (HOSPITAL_COMMUNITY): Payer: Self-pay | Admitting: Internal Medicine

## 2024-04-01 DIAGNOSIS — H40012 Open angle with borderline findings, low risk, left eye: Secondary | ICD-10-CM | POA: Diagnosis not present

## 2024-04-05 DIAGNOSIS — H5213 Myopia, bilateral: Secondary | ICD-10-CM | POA: Diagnosis not present

## 2024-04-13 DIAGNOSIS — F3341 Major depressive disorder, recurrent, in partial remission: Secondary | ICD-10-CM | POA: Diagnosis not present

## 2024-04-22 DIAGNOSIS — I10 Essential (primary) hypertension: Secondary | ICD-10-CM | POA: Diagnosis not present

## 2024-04-22 DIAGNOSIS — H53452 Other localized visual field defect, left eye: Secondary | ICD-10-CM | POA: Diagnosis not present

## 2024-04-27 DIAGNOSIS — H539 Unspecified visual disturbance: Secondary | ICD-10-CM | POA: Diagnosis not present

## 2024-04-29 DIAGNOSIS — H40012 Open angle with borderline findings, low risk, left eye: Secondary | ICD-10-CM | POA: Diagnosis not present

## 2024-04-30 DIAGNOSIS — H40002 Preglaucoma, unspecified, left eye: Secondary | ICD-10-CM | POA: Diagnosis not present

## 2024-04-30 DIAGNOSIS — H35371 Puckering of macula, right eye: Secondary | ICD-10-CM | POA: Diagnosis not present

## 2024-04-30 DIAGNOSIS — H5316 Psychophysical visual disturbances: Secondary | ICD-10-CM | POA: Diagnosis not present

## 2024-04-30 DIAGNOSIS — H47011 Ischemic optic neuropathy, right eye: Secondary | ICD-10-CM | POA: Diagnosis not present

## 2024-05-19 DIAGNOSIS — Z1231 Encounter for screening mammogram for malignant neoplasm of breast: Secondary | ICD-10-CM | POA: Diagnosis not present

## 2024-05-21 DIAGNOSIS — M25512 Pain in left shoulder: Secondary | ICD-10-CM | POA: Diagnosis not present

## 2024-05-21 DIAGNOSIS — M25511 Pain in right shoulder: Secondary | ICD-10-CM | POA: Diagnosis not present

## 2024-05-21 DIAGNOSIS — M47812 Spondylosis without myelopathy or radiculopathy, cervical region: Secondary | ICD-10-CM | POA: Diagnosis not present

## 2024-05-31 DIAGNOSIS — Z8551 Personal history of malignant neoplasm of bladder: Secondary | ICD-10-CM | POA: Diagnosis not present

## 2024-06-16 DIAGNOSIS — R0989 Other specified symptoms and signs involving the circulatory and respiratory systems: Secondary | ICD-10-CM | POA: Diagnosis not present

## 2024-06-16 DIAGNOSIS — R490 Dysphonia: Secondary | ICD-10-CM | POA: Diagnosis not present

## 2024-06-16 DIAGNOSIS — J383 Other diseases of vocal cords: Secondary | ICD-10-CM | POA: Diagnosis not present

## 2024-06-17 ENCOUNTER — Encounter (HOSPITAL_COMMUNITY)

## 2024-06-17 DIAGNOSIS — R051 Acute cough: Secondary | ICD-10-CM | POA: Diagnosis not present

## 2024-06-17 DIAGNOSIS — J069 Acute upper respiratory infection, unspecified: Secondary | ICD-10-CM | POA: Diagnosis not present

## 2024-06-18 DIAGNOSIS — M25512 Pain in left shoulder: Secondary | ICD-10-CM | POA: Diagnosis not present

## 2024-06-18 DIAGNOSIS — M25511 Pain in right shoulder: Secondary | ICD-10-CM | POA: Diagnosis not present

## 2024-07-07 ENCOUNTER — Ambulatory Visit (HOSPITAL_COMMUNITY)
Admission: RE | Admit: 2024-07-07 | Discharge: 2024-07-07 | Disposition: A | Discharge status: Home / Self Care | Source: Ambulatory Visit | Attending: Internal Medicine | Admitting: Internal Medicine

## 2024-07-07 VITALS — BP 136/59 | HR 71 | Temp 97.9°F | Resp 16

## 2024-07-07 DIAGNOSIS — M810 Age-related osteoporosis without current pathological fracture: Secondary | ICD-10-CM | POA: Insufficient documentation

## 2024-07-07 MED ORDER — DENOSUMAB 60 MG/ML ~~LOC~~ SOSY
PREFILLED_SYRINGE | SUBCUTANEOUS | Status: AC
  Filled 2024-07-07: qty 1

## 2024-07-07 MED ORDER — DENOSUMAB 60 MG/ML ~~LOC~~ SOSY
60.0000 mg | PREFILLED_SYRINGE | Freq: Once | SUBCUTANEOUS | Status: AC
  Administered 2024-07-07: 60 mg via SUBCUTANEOUS

## 2024-12-17 ENCOUNTER — Encounter (HOSPITAL_COMMUNITY)

## 2025-01-06 ENCOUNTER — Encounter (HOSPITAL_COMMUNITY)
